# Patient Record
Sex: Female | Born: 1985 | Race: Black or African American | Hispanic: No | Marital: Single | Smoking: Never smoker
Health system: Southern US, Community
[De-identification: ages and names within clinical notes are randomized; demographics above are authoritative.]

## PROBLEM LIST (undated history)

## (undated) DIAGNOSIS — G259 Extrapyramidal and movement disorder, unspecified: Secondary | ICD-10-CM

## (undated) DIAGNOSIS — E119 Type 2 diabetes mellitus without complications: Secondary | ICD-10-CM

## (undated) DIAGNOSIS — G35 Multiple sclerosis: Secondary | ICD-10-CM

## (undated) HISTORY — DX: Type 2 diabetes mellitus without complications: E11.9

## (undated) HISTORY — DX: Extrapyramidal and movement disorder, unspecified: G25.9

---
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
## 2007-02-19 ENCOUNTER — Emergency Department (HOSPITAL_COMMUNITY): Admission: EM | Admit: 2007-02-19 | Discharge: 2007-02-19 | Payer: Self-pay | Admitting: Family Medicine

## 2007-06-04 ENCOUNTER — Emergency Department (HOSPITAL_COMMUNITY): Admission: EM | Admit: 2007-06-04 | Discharge: 2007-06-04 | Payer: Self-pay | Admitting: Emergency Medicine

## 2007-06-04 IMAGING — CR DG KNEE COMPLETE 4+V*R*
4 series · 4 of 4 positions shown · non-contrast
Comparison: none

CLINICAL DATA: Pain.  
RIGHT KNEE - 4 VIEW:

[view not recorded (1 of 4)]
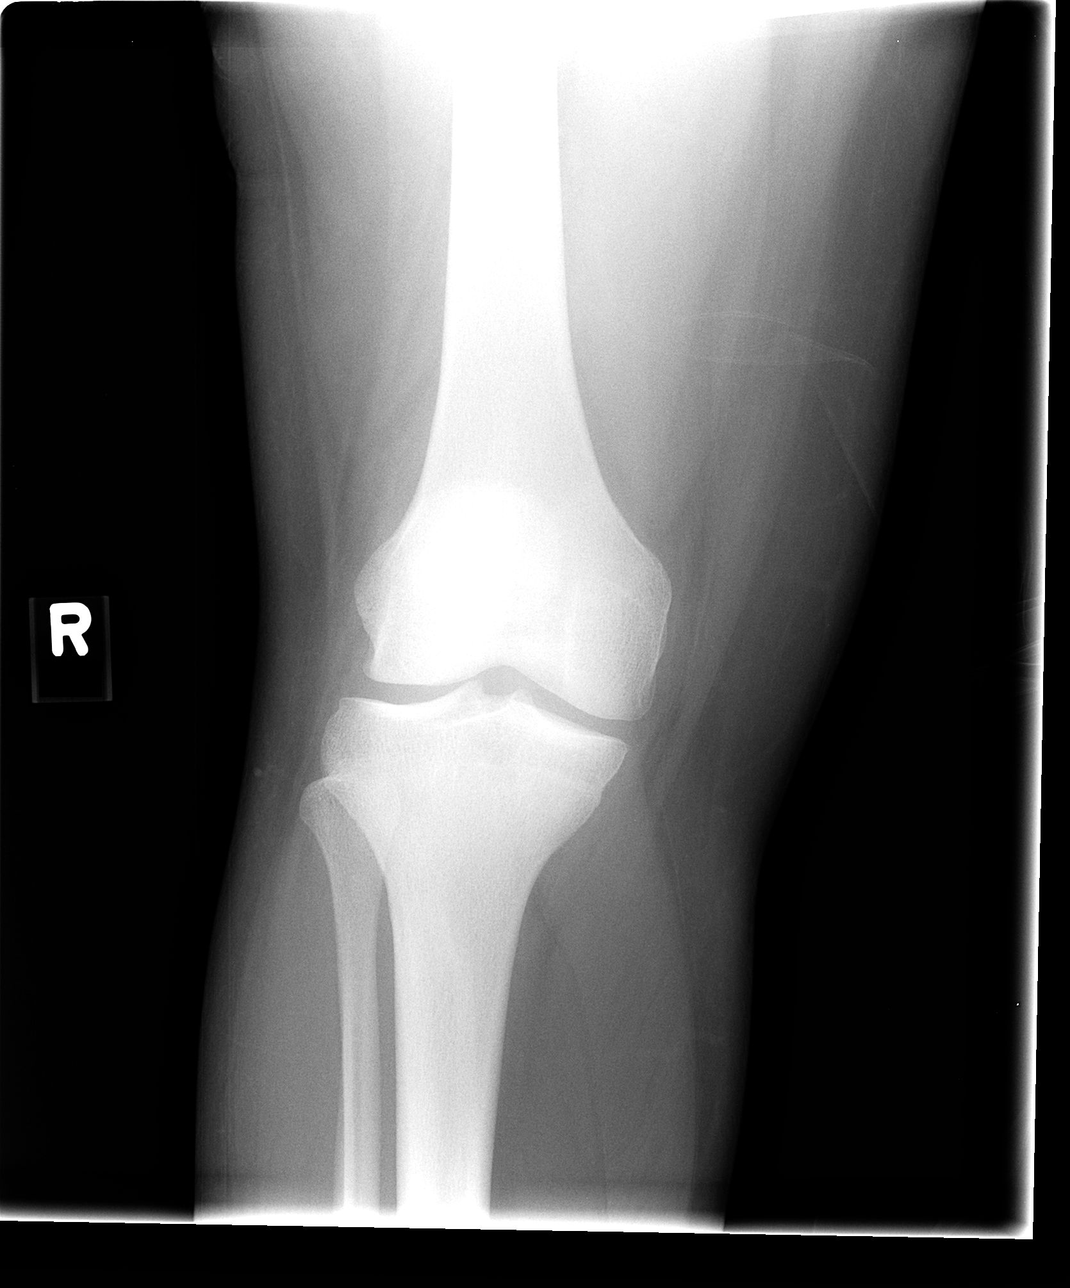

[view not recorded (2 of 4)]
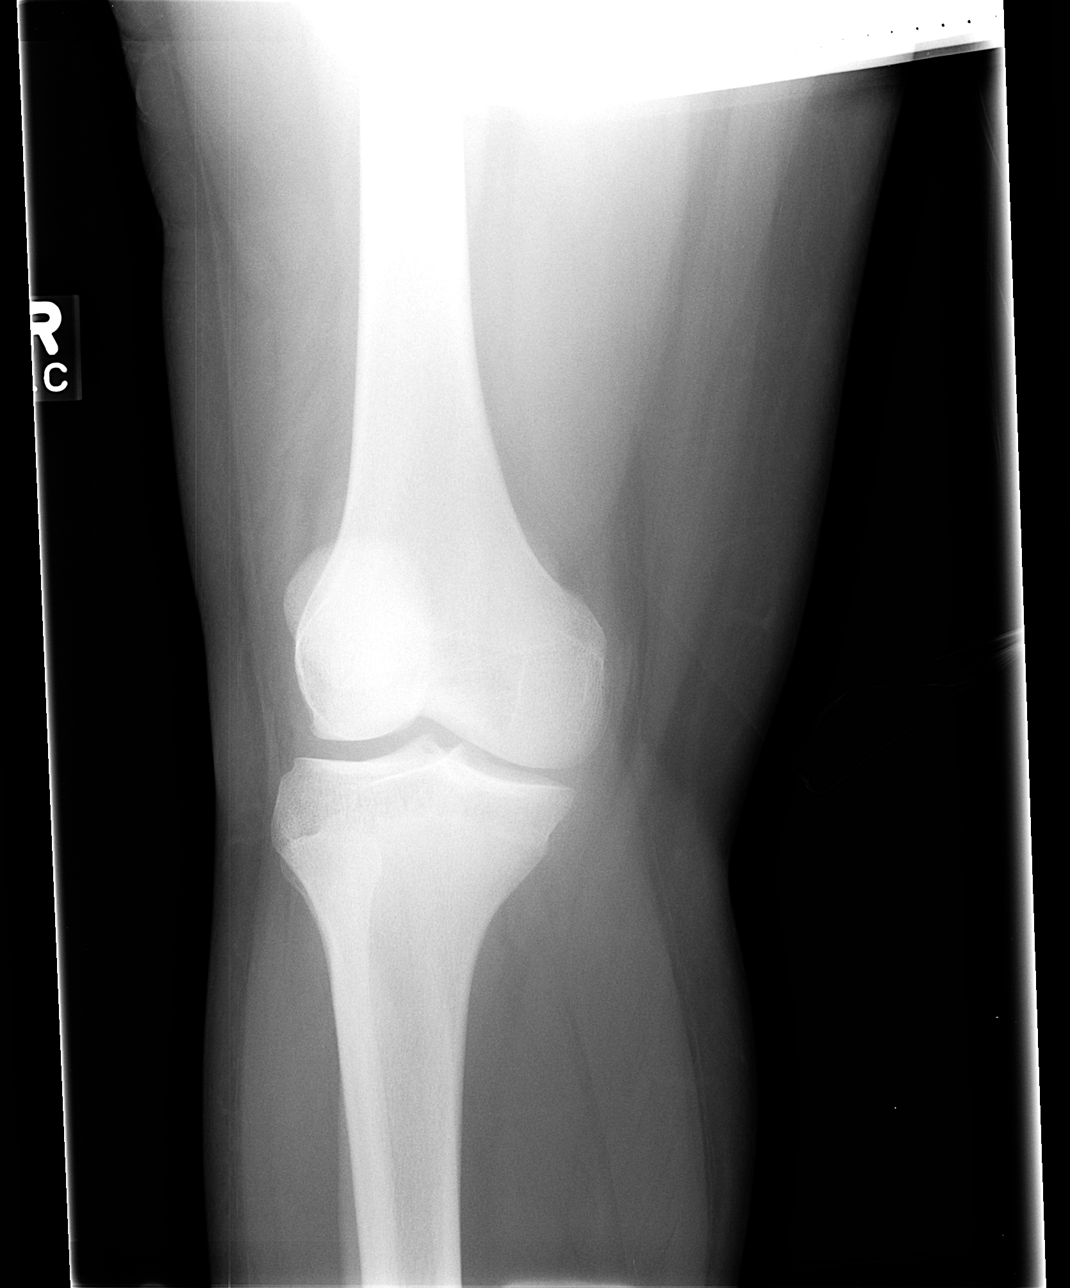

[view not recorded (3 of 4)]
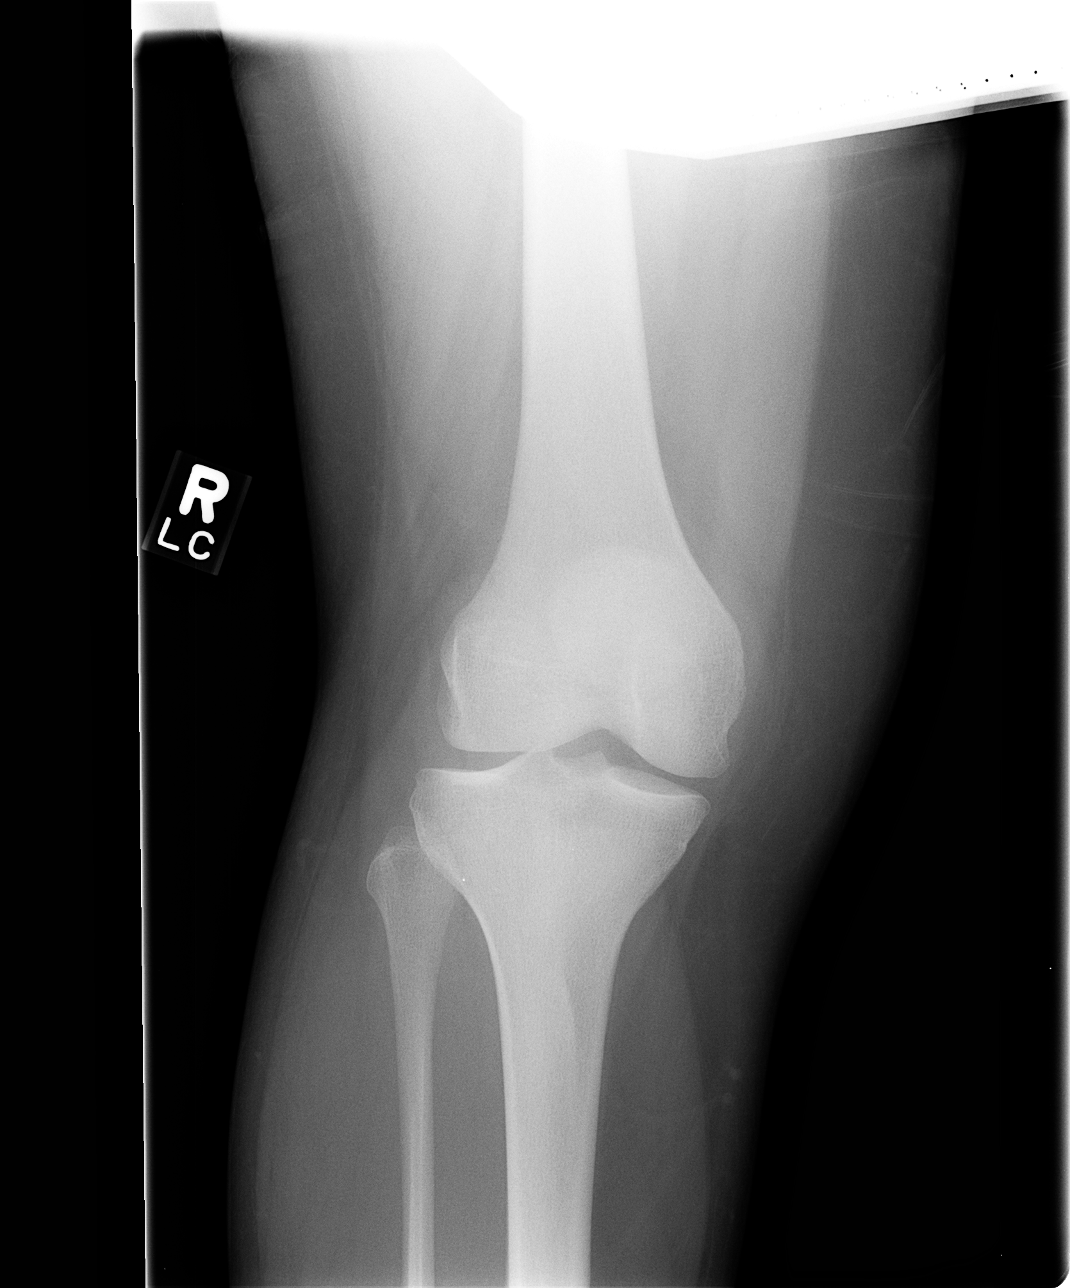

[view not recorded (4 of 4)]
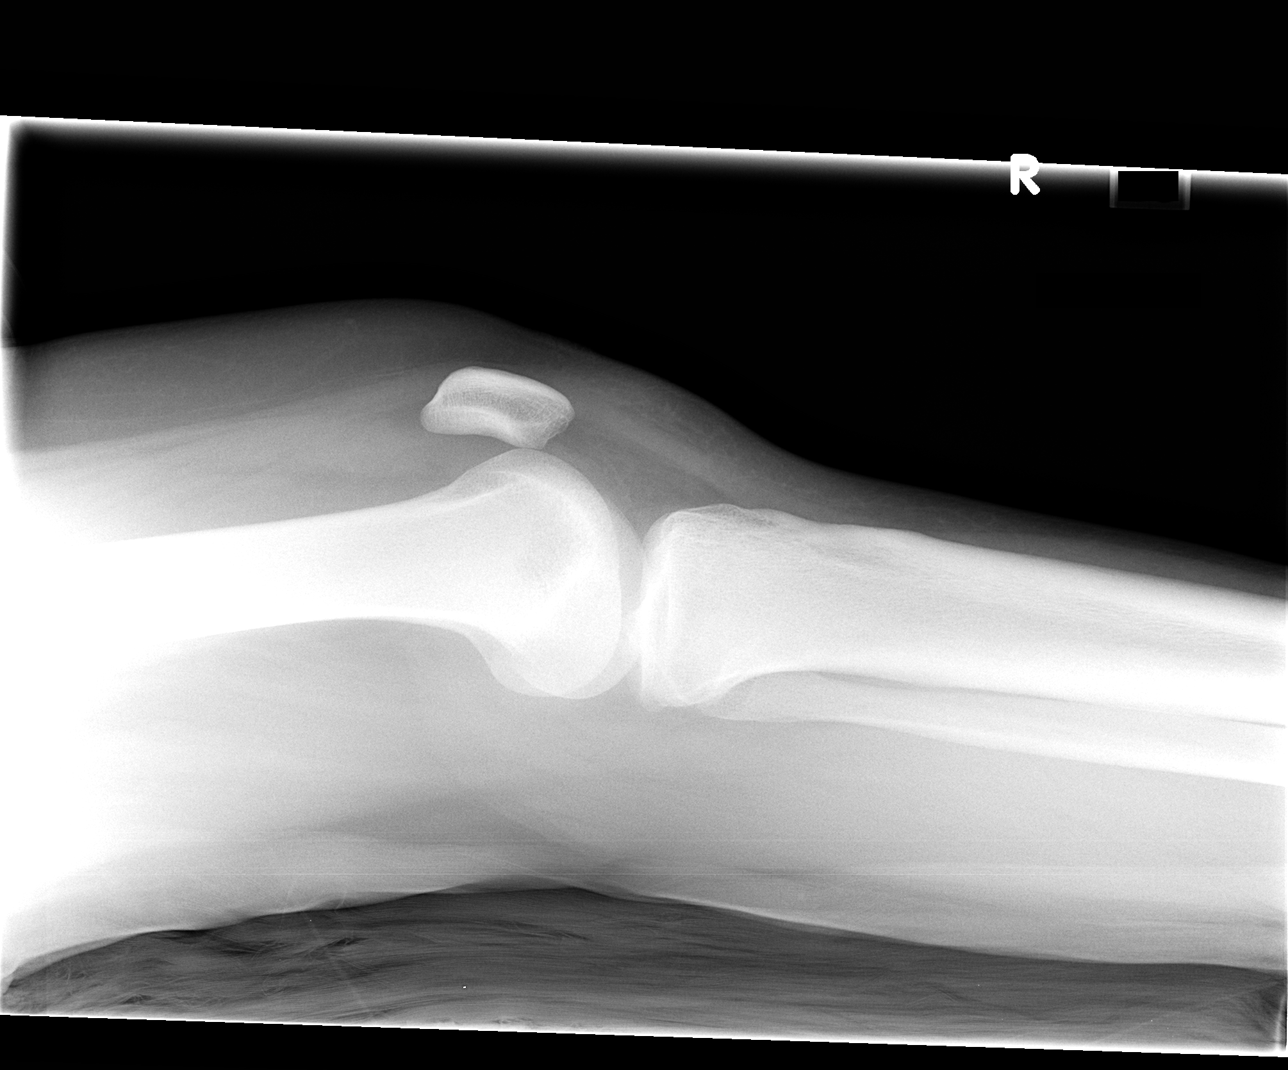

[4 of 4 positions shown; findings below may reference images not displayed]

FINDINGS: There is a moderate joint effusion.  No fracture or dislocation.  Joint spaces appear maintained.
IMPRESSION: Moderate joint effusion without underlying fracture.

## 2007-08-08 ENCOUNTER — Emergency Department (HOSPITAL_COMMUNITY): Admission: EM | Admit: 2007-08-08 | Discharge: 2007-08-08 | Payer: Self-pay | Admitting: Neurosurgery

## 2007-09-14 ENCOUNTER — Emergency Department (HOSPITAL_COMMUNITY): Admission: EM | Admit: 2007-09-14 | Discharge: 2007-09-14 | Payer: Self-pay | Admitting: Advanced Practice Midwife

## 2007-09-16 ENCOUNTER — Emergency Department (HOSPITAL_COMMUNITY): Admission: EM | Admit: 2007-09-16 | Discharge: 2007-09-17 | Payer: Self-pay | Admitting: Emergency Medicine

## 2007-09-19 ENCOUNTER — Emergency Department (HOSPITAL_COMMUNITY): Admission: EM | Admit: 2007-09-19 | Discharge: 2007-09-19 | Payer: Self-pay | Admitting: Emergency Medicine

## 2007-09-19 IMAGING — CR DG LUMBAR SPINE COMPLETE 4+V
5 series · 5 of 5 positions shown · non-contrast
Comparison: none

CLINICAL DATA: Left-sided numbness.
 CERVICAL SPINE ? 5 VIEW:

[t l-spine a.p.]
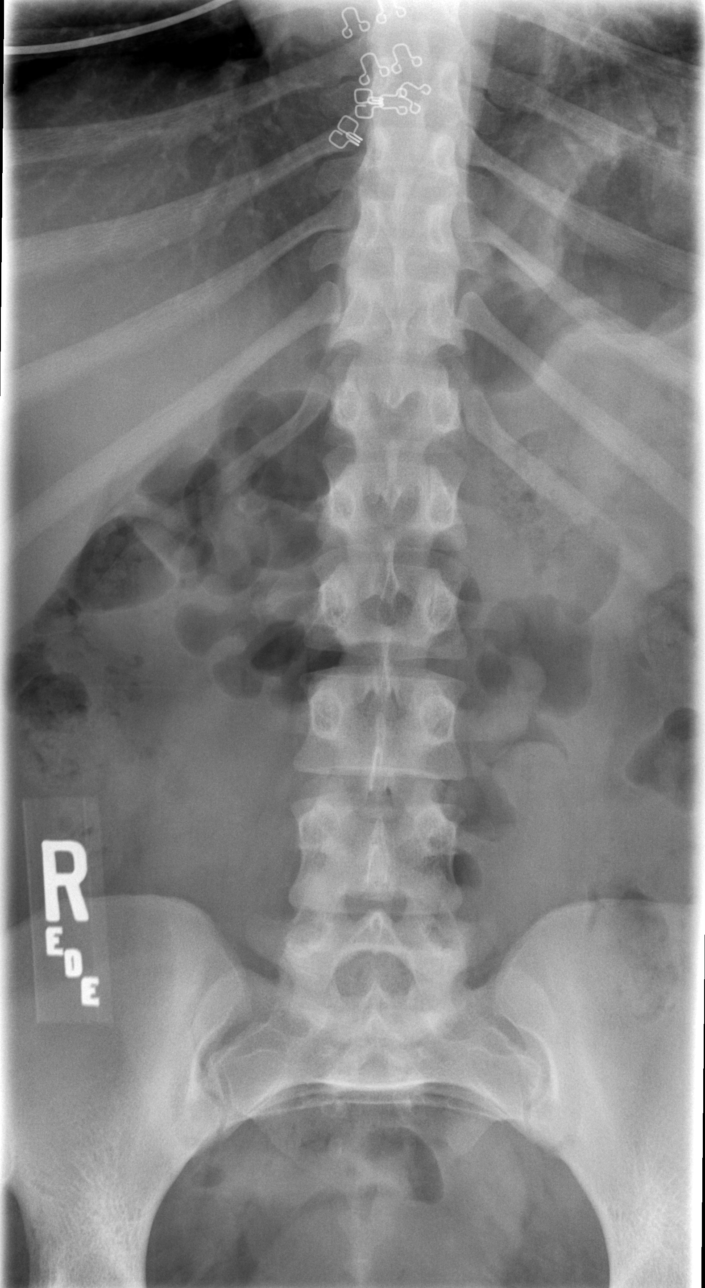

[t l-spine oblique exposure (1 of 2)]
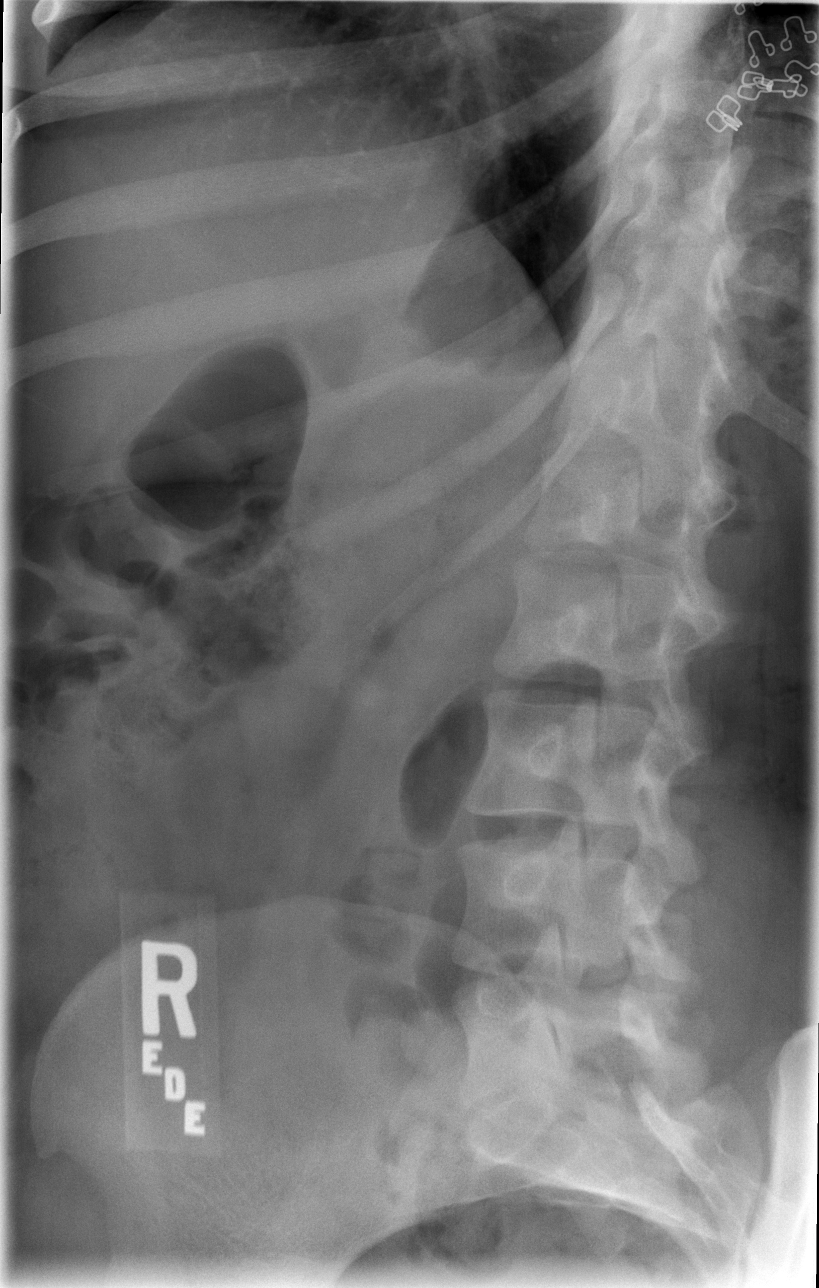

[t l-spine oblique exposure (2 of 2)]
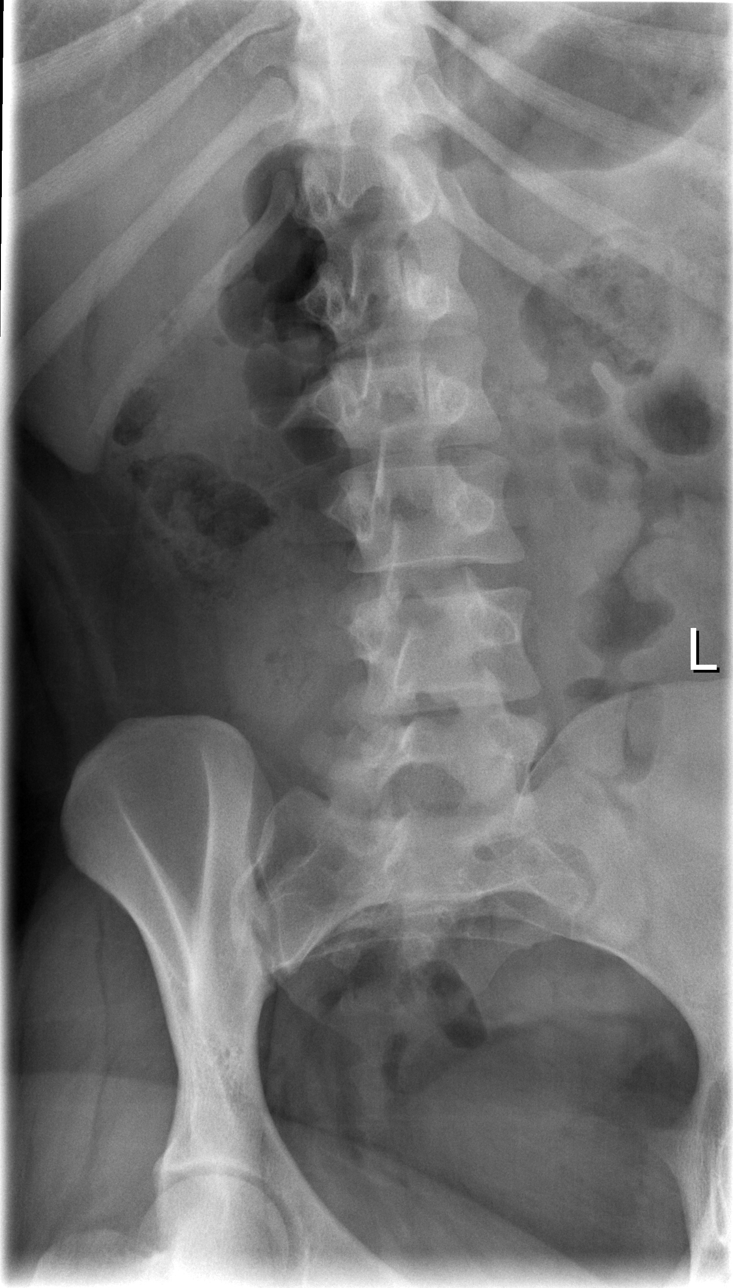

[t l-spine lat]
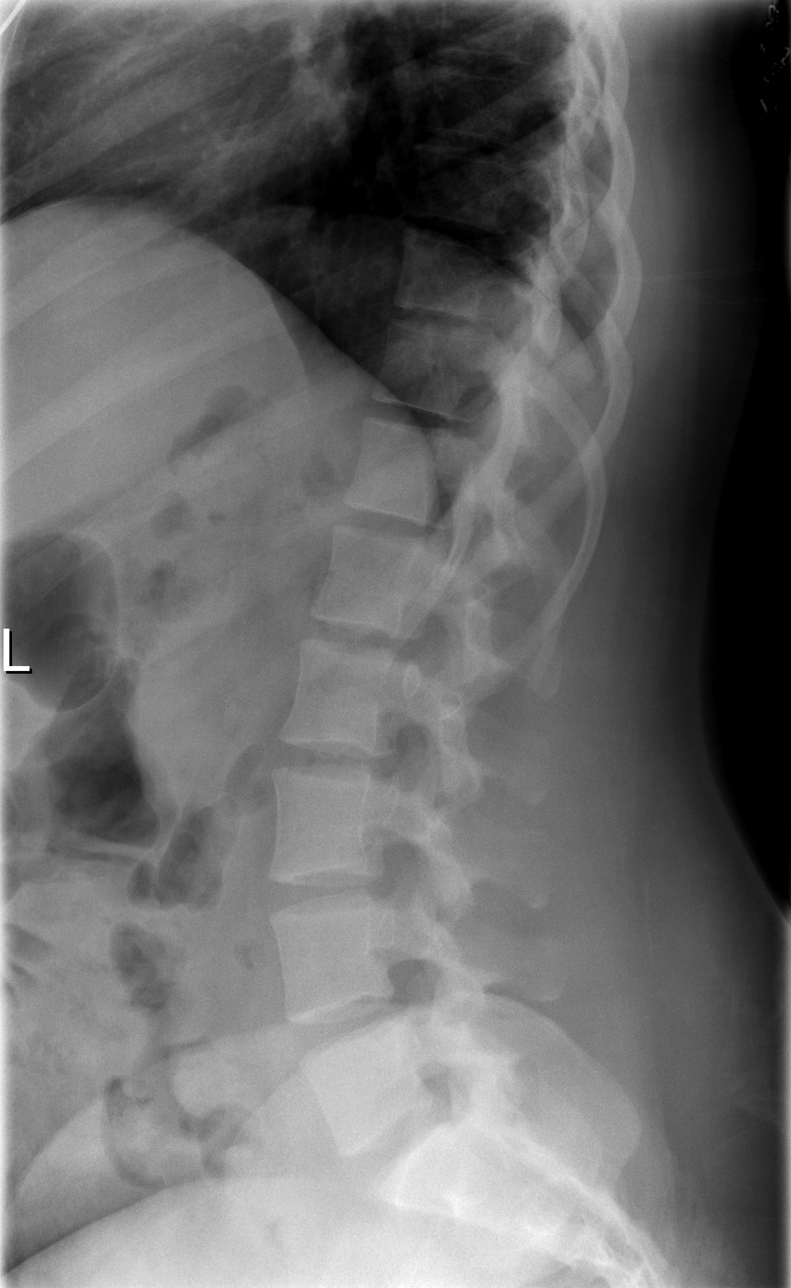

[t l-spine l5-s1 spot]
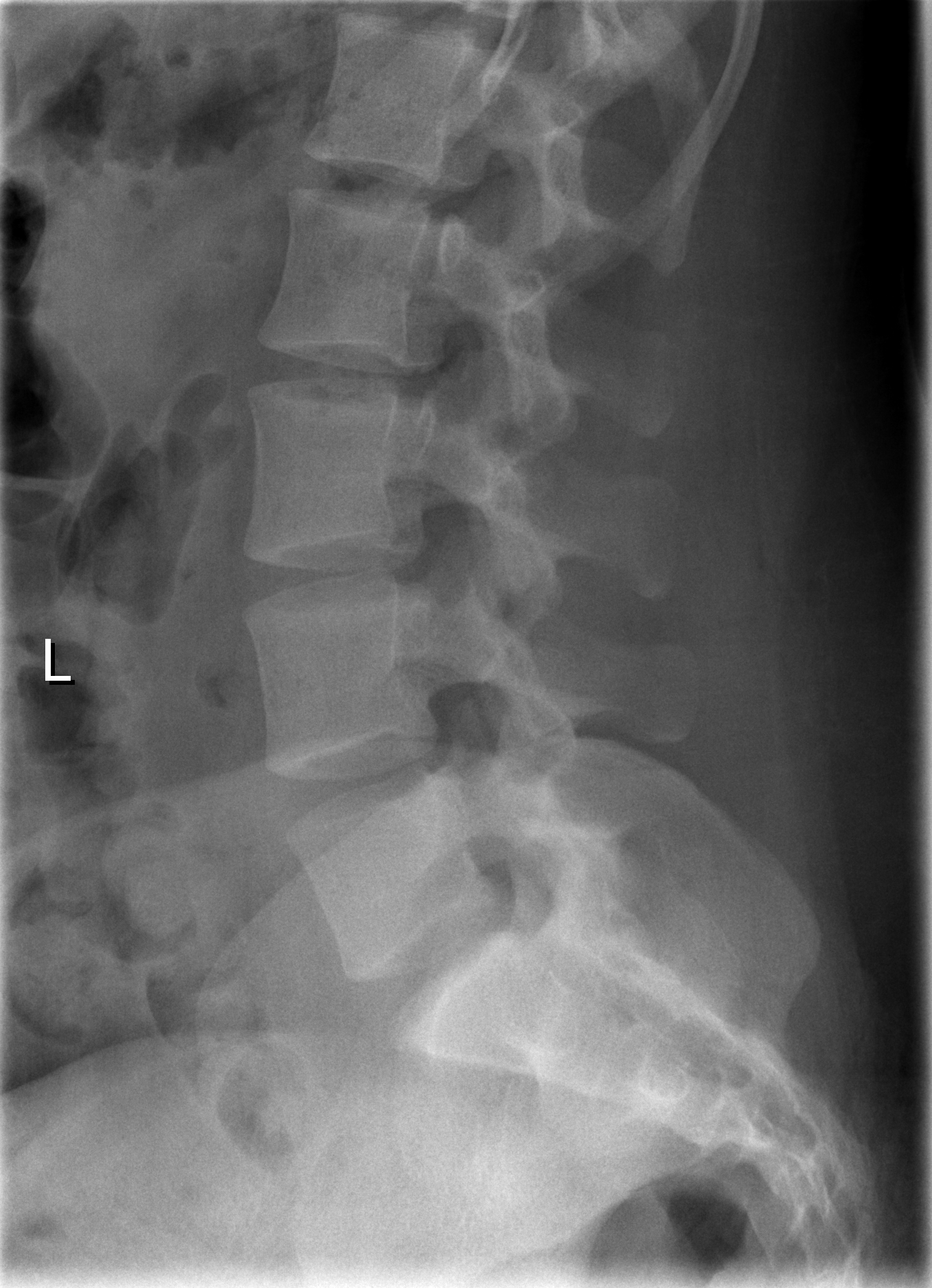

[5 of 5 positions shown; findings below may reference images not displayed]

FINDINGS: Vertebral body height and alignment are normal. There is no degenerative disease. Prevertebral soft tissues appear normal. Note is made of dental disease which is partially visualized.
IMPRESSION: Negative cervical spine with dental disease noted. Question tooth extraction.
 LUMBAR SPINE ? 4 VIEW:
FINDINGS: Vertebral body height and alignment are normal. There is no pars interarticularis defect.  No degenerative disease. Soft tissue structures appear normal.
IMPRESSION: Negative study.

## 2007-09-19 IMAGING — CR DG CERVICAL SPINE COMPLETE 4+V
4 series · 4 of 4 positions shown · non-contrast
Comparison: none

CLINICAL DATA: Left-sided numbness.
 CERVICAL SPINE ? 5 VIEW:

[w c-spine lat]
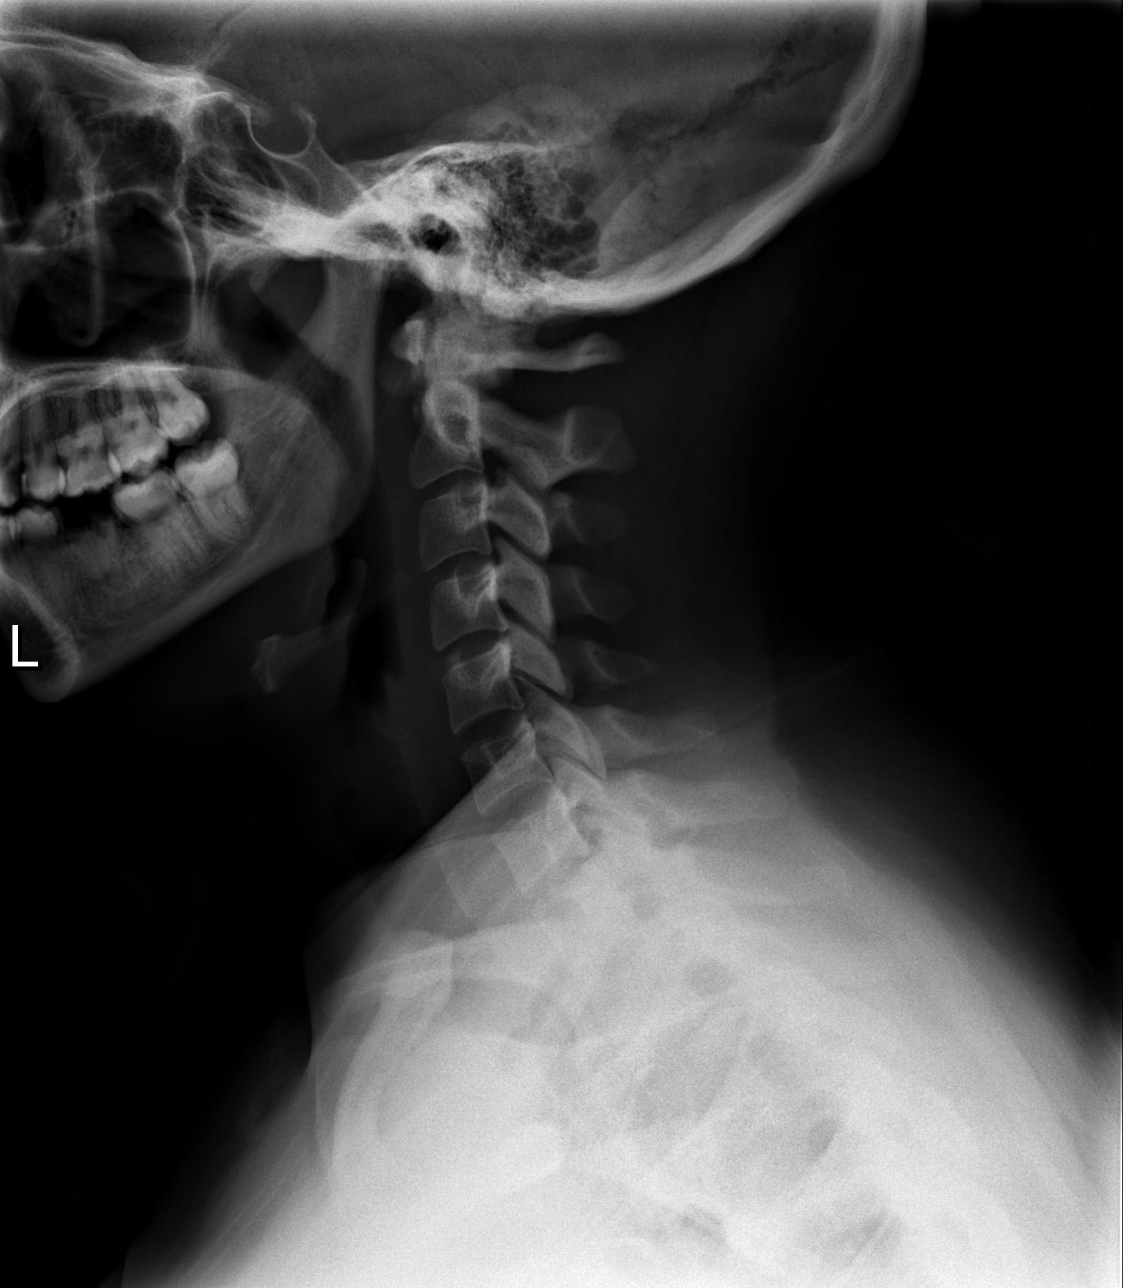

[w c-spine oblique]
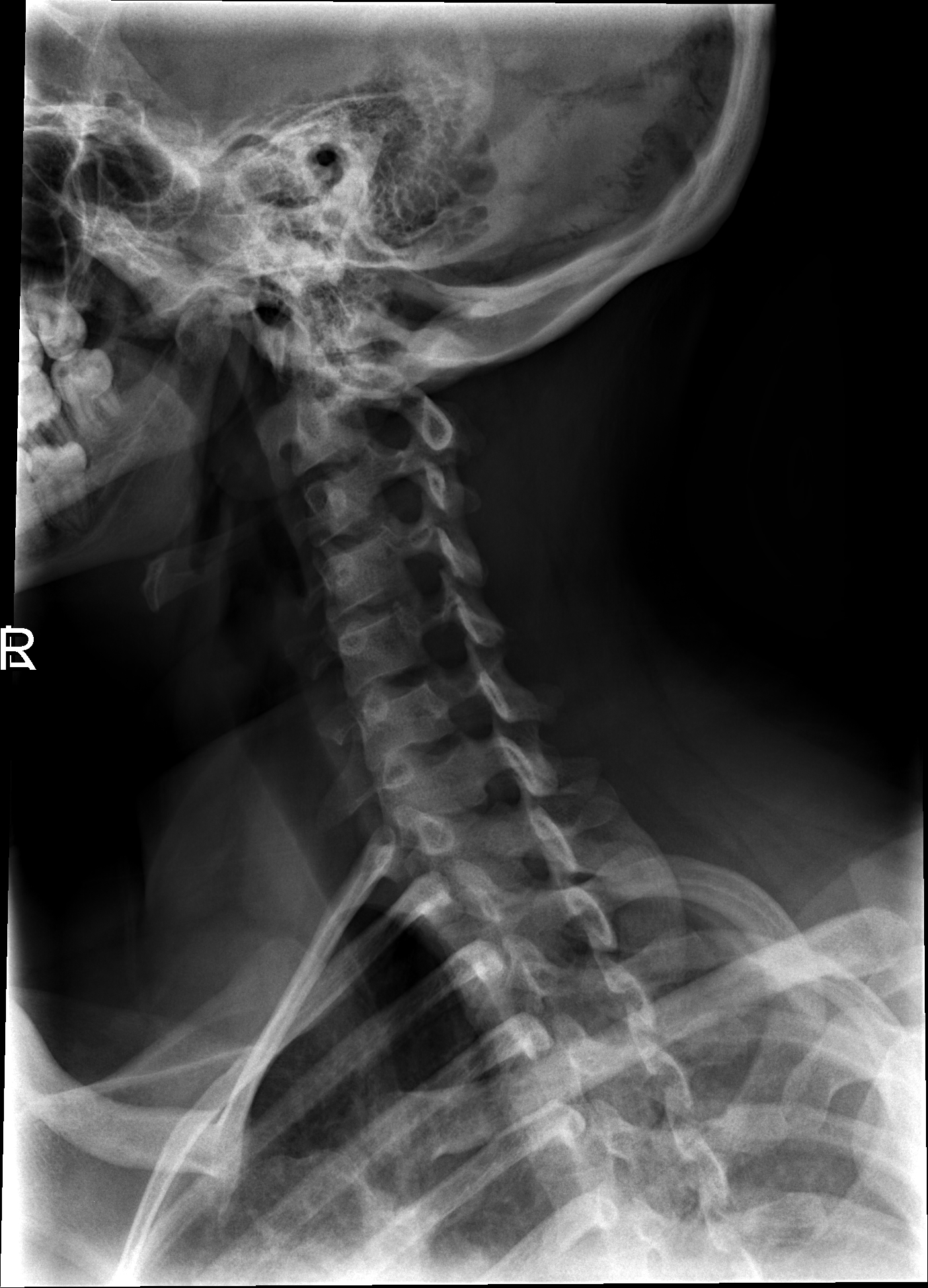

[w c-spine a.p.]
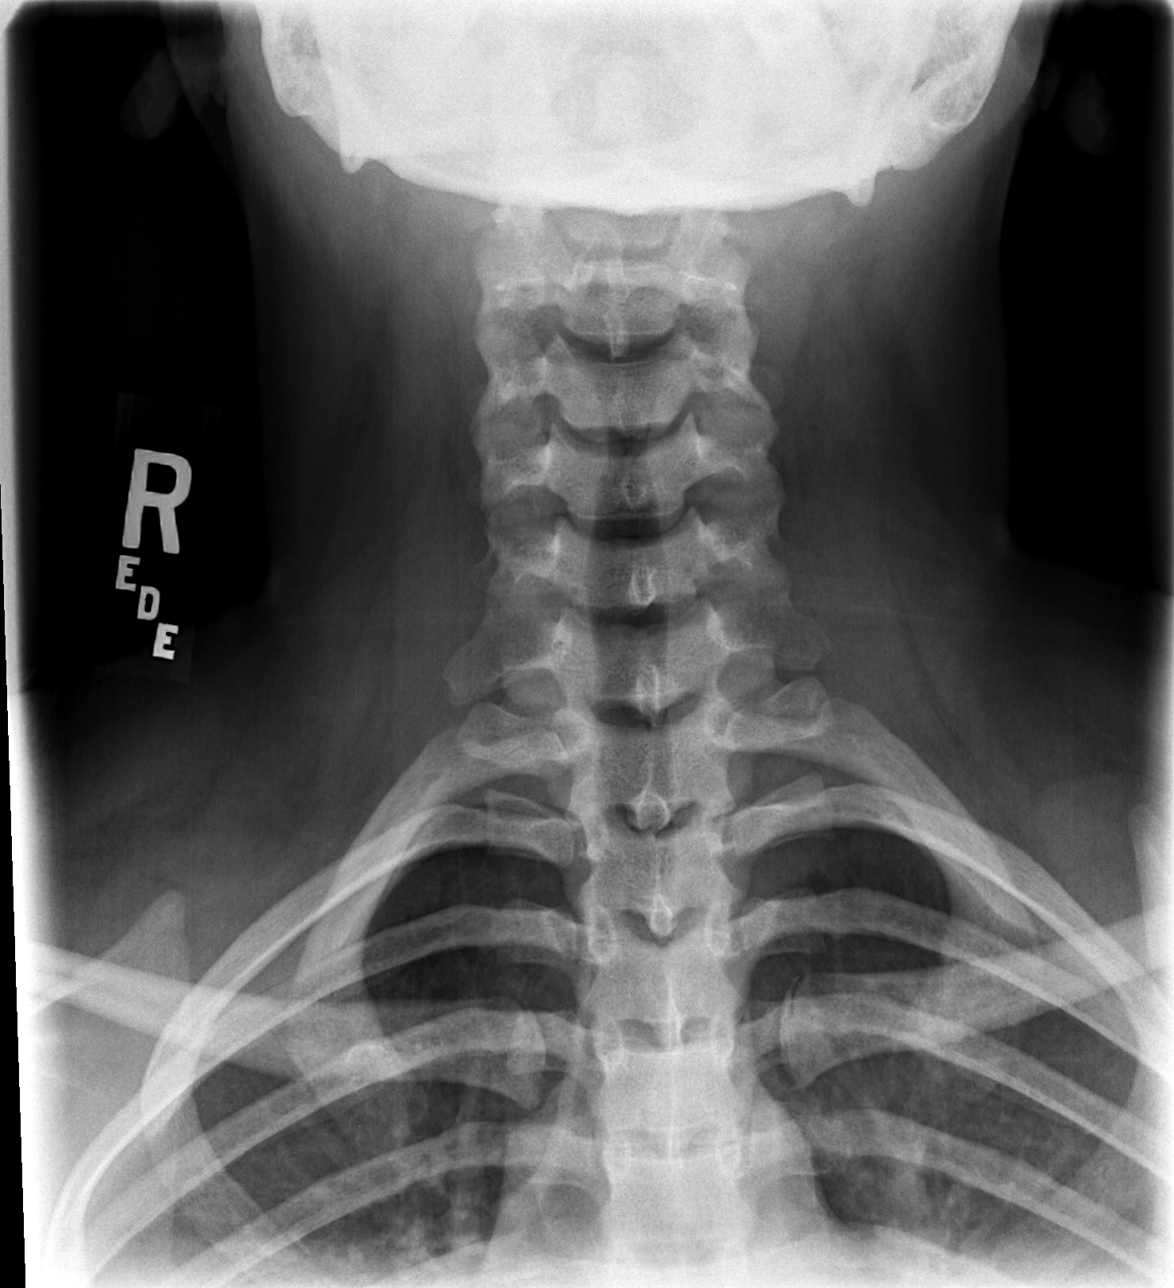

[w c-spine odontoid]
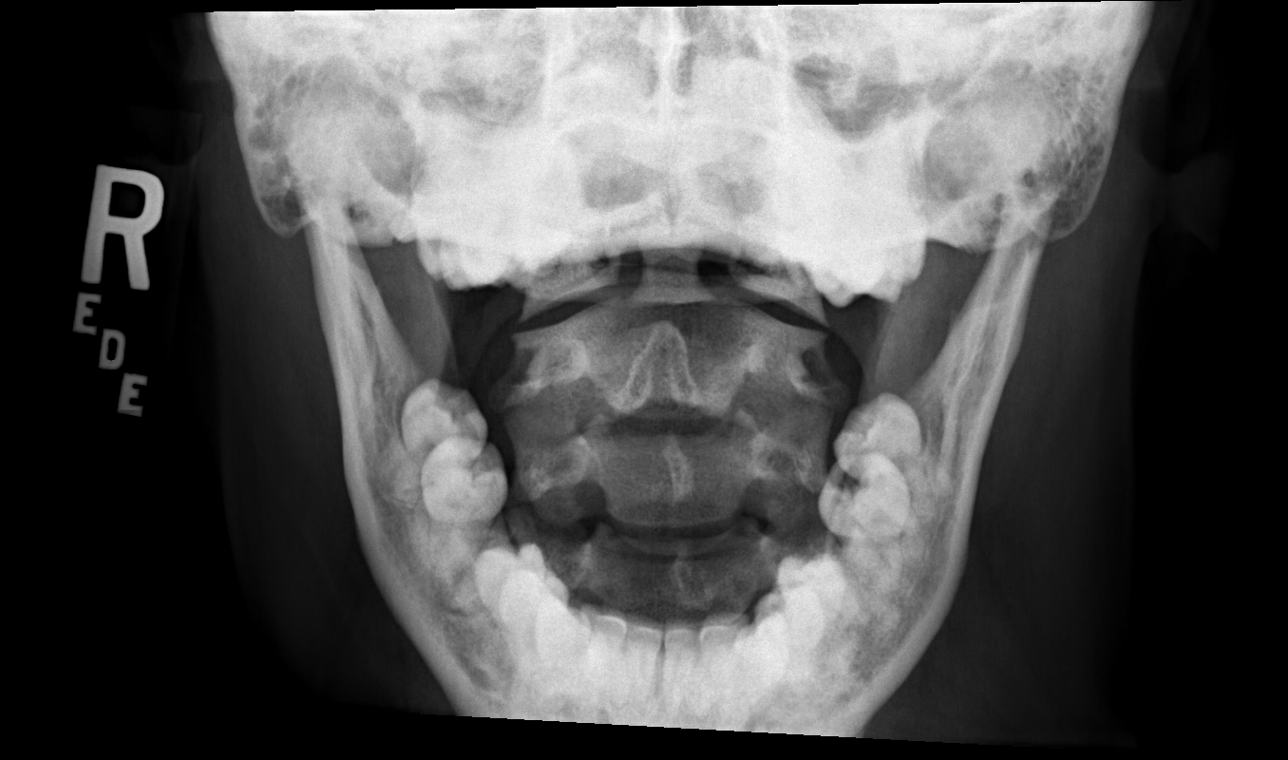

[4 of 4 positions shown; findings below may reference images not displayed]

FINDINGS: Vertebral body height and alignment are normal. There is no degenerative disease. Prevertebral soft tissues appear normal. Note is made of dental disease which is partially visualized.
IMPRESSION: Negative cervical spine with dental disease noted. Question tooth extraction.
 LUMBAR SPINE ? 4 VIEW:
FINDINGS: Vertebral body height and alignment are normal. There is no pars interarticularis defect.  No degenerative disease. Soft tissue structures appear normal.
IMPRESSION: Negative study.

## 2008-01-19 ENCOUNTER — Emergency Department (HOSPITAL_COMMUNITY): Admission: EM | Admit: 2008-01-19 | Discharge: 2008-01-20 | Payer: Self-pay | Admitting: Emergency Medicine
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
## 2008-03-12 ENCOUNTER — Emergency Department (HOSPITAL_COMMUNITY): Admission: EM | Admit: 2008-03-12 | Discharge: 2008-03-12 | Payer: Self-pay | Admitting: *Deleted

## 2008-06-11 ENCOUNTER — Emergency Department (HOSPITAL_COMMUNITY): Admission: EM | Admit: 2008-06-11 | Discharge: 2008-06-11 | Payer: Self-pay | Admitting: Emergency Medicine

## 2009-03-19 ENCOUNTER — Emergency Department (HOSPITAL_COMMUNITY): Admission: EM | Admit: 2009-03-19 | Discharge: 2009-03-19 | Payer: Self-pay | Admitting: Family Medicine

## 2009-03-28 ENCOUNTER — Emergency Department (HOSPITAL_COMMUNITY): Admission: EM | Admit: 2009-03-28 | Discharge: 2009-03-28 | Payer: Self-pay | Admitting: Emergency Medicine

## 2009-07-19 ENCOUNTER — Inpatient Hospital Stay (HOSPITAL_COMMUNITY): Admission: AD | Admit: 2009-07-19 | Discharge: 2009-07-20 | Payer: Self-pay | Admitting: Obstetrics & Gynecology

## 2009-07-19 ENCOUNTER — Ambulatory Visit: Payer: Self-pay | Admitting: Obstetrics and Gynecology

## 2009-07-19 ENCOUNTER — Encounter: Payer: Self-pay | Admitting: Family Medicine

## 2009-07-19 ENCOUNTER — Emergency Department (HOSPITAL_COMMUNITY): Admission: EM | Admit: 2009-07-19 | Discharge: 2009-07-19 | Payer: Self-pay | Admitting: Emergency Medicine

## 2009-07-19 IMAGING — US US OB COMP +14 WK
1 series · 14 of 28 positions shown · non-contrast
Comparison: none

OBSTETRICAL ULTRASOUND:
 This ultrasound exam was performed in the [HOSPITAL] Ultrasound Department.  The OB US report was generated in the AS system, and faxed to the ordering physician.  This report is also available in [HOSPITAL]?s [employer] and in [REDACTED] PACS.

[Series 1: us ob comp +14 wk · 0.24mm/px · 72 acquisitions, 14 frames shown]
[im 3/72]
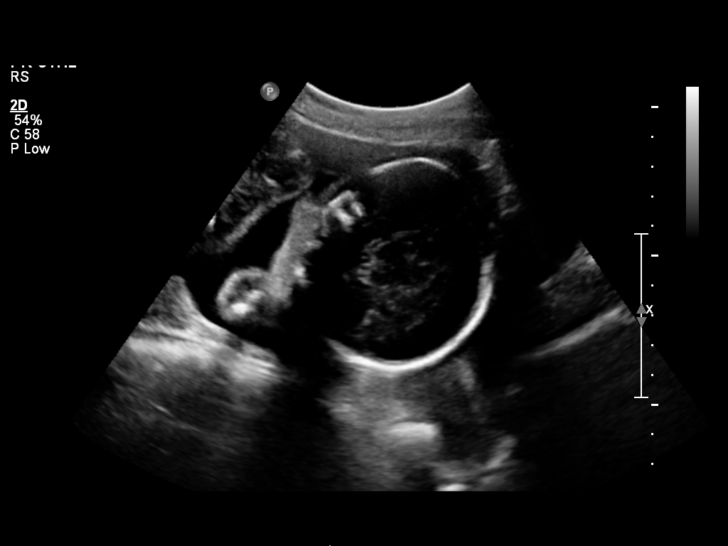
[im 8/72]
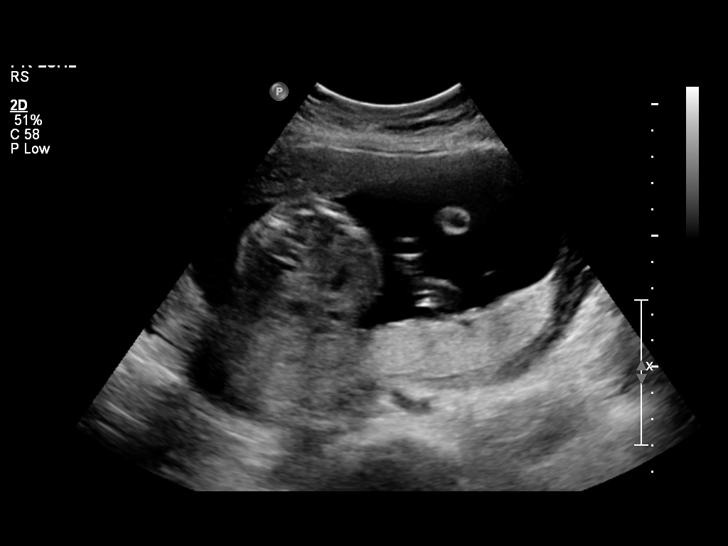
[im 14/72]
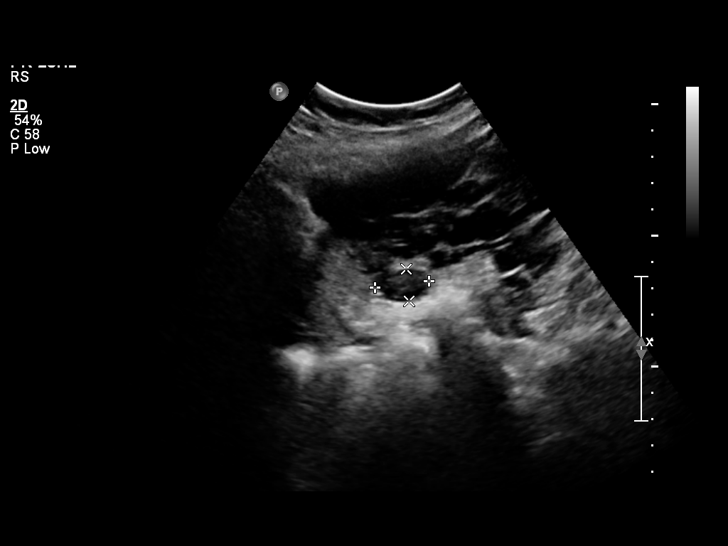
[im 19/72]
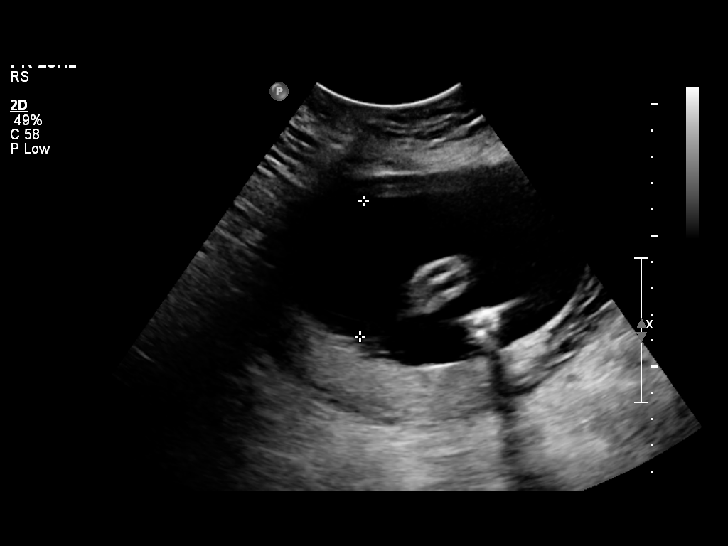
[im 24/72]
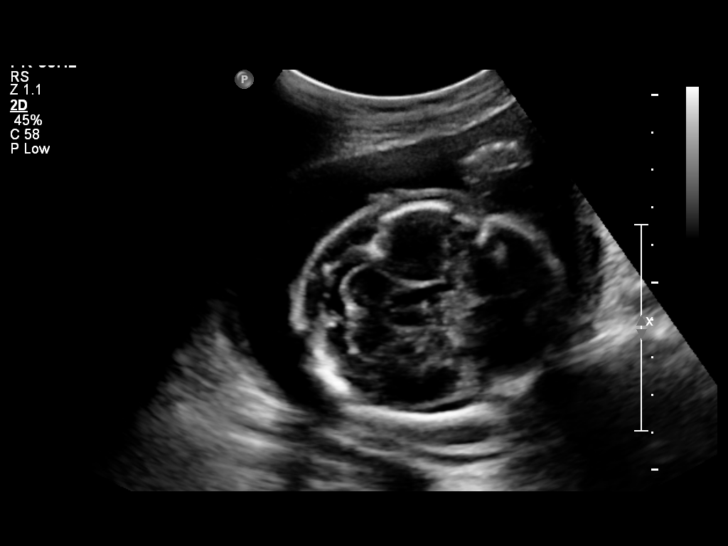
[im 29/72]
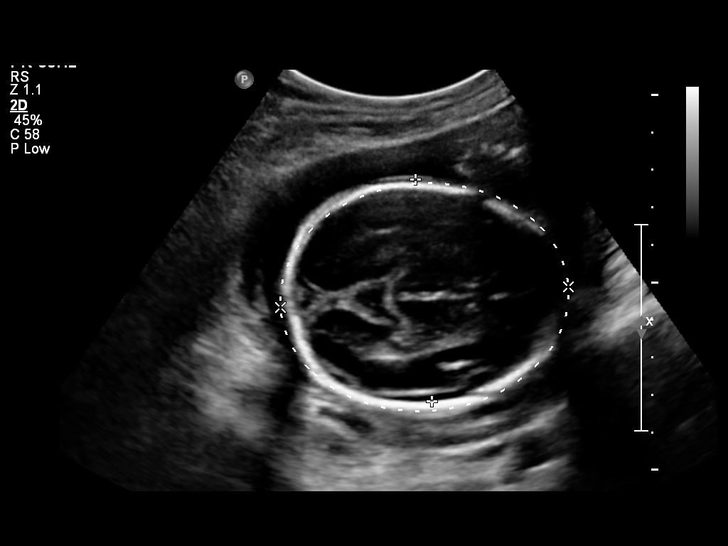
[im 35/72]
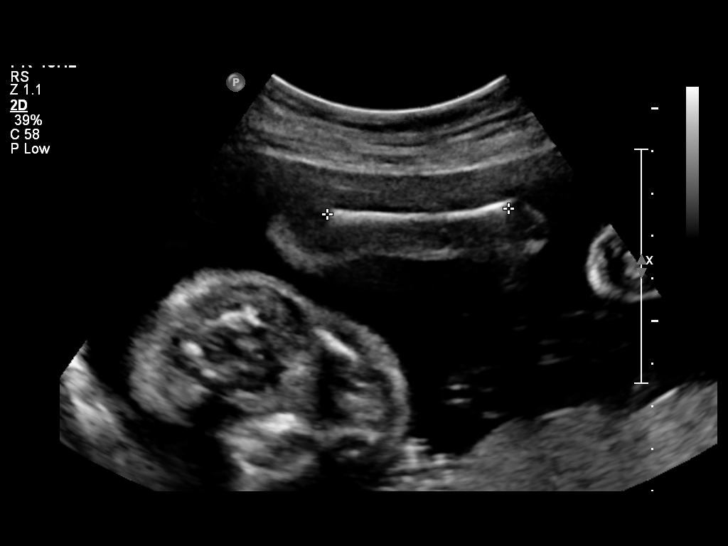
[im 40/72]
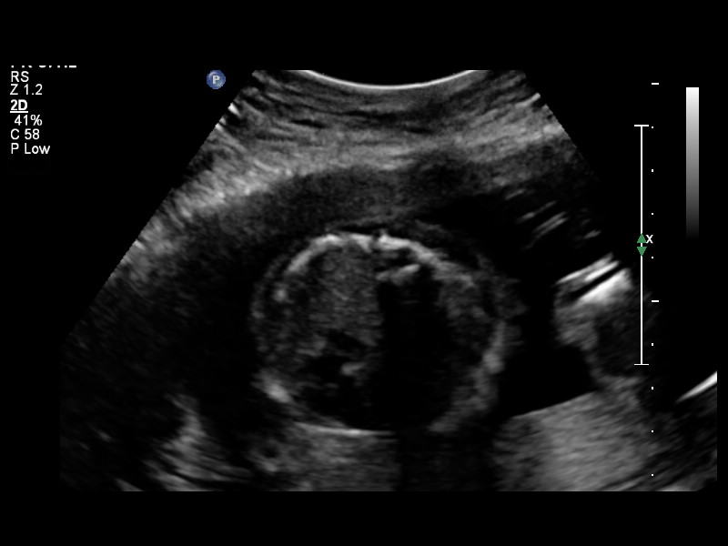
[im 45/72]
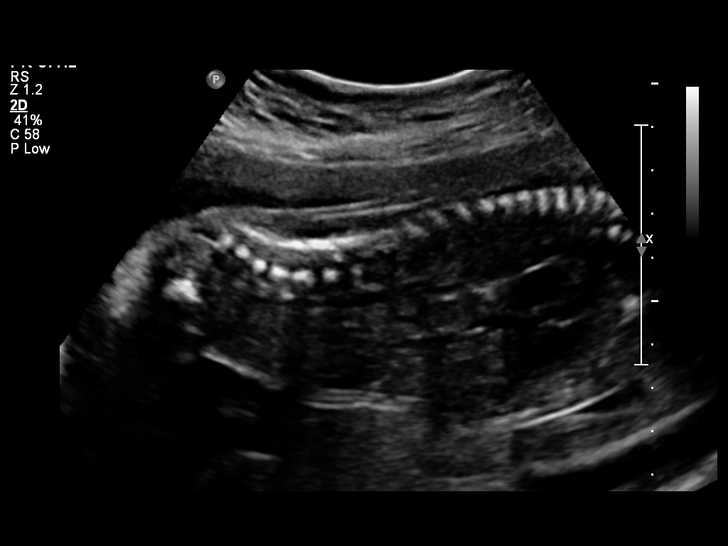
[im 50/72]
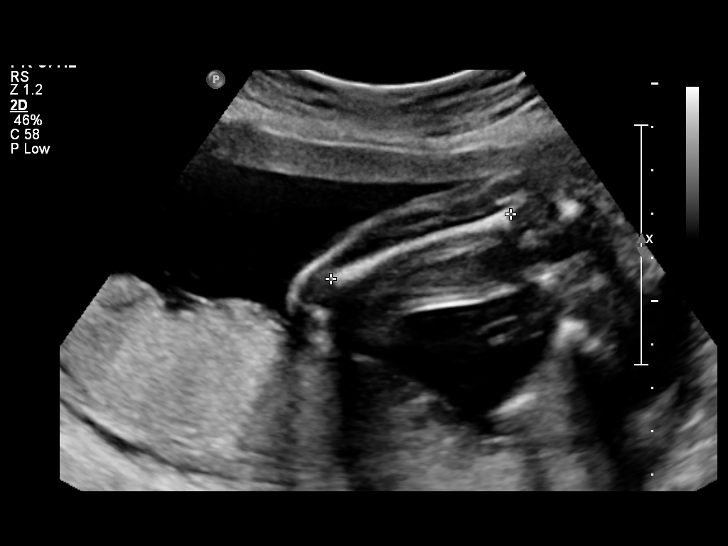
[im 56/72]
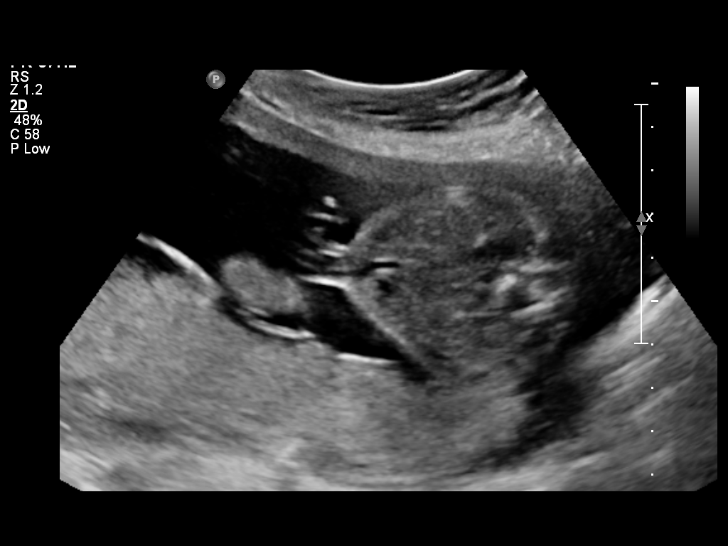
[im 61/72]
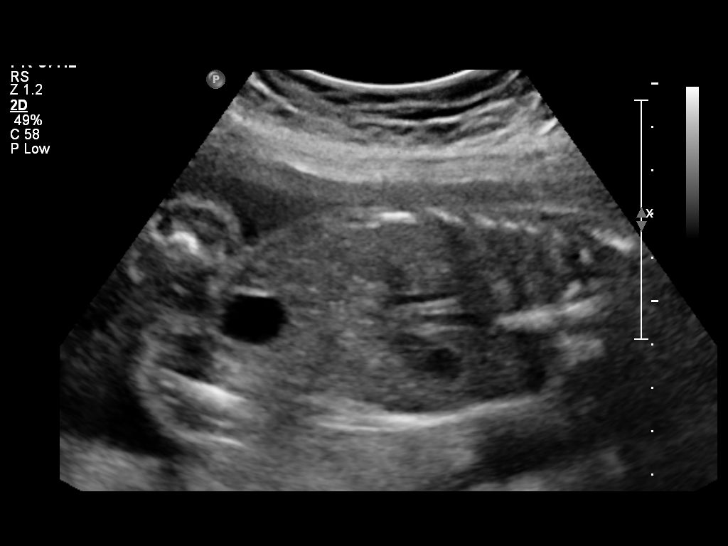
[im 66/72]
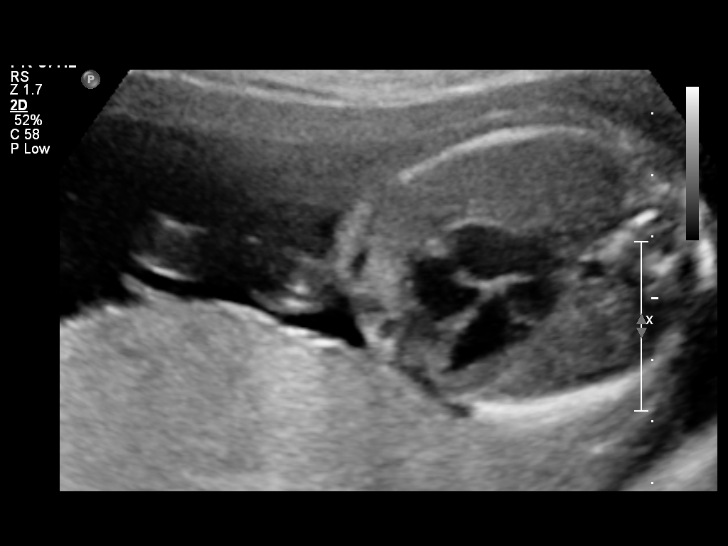
[im 72/72]
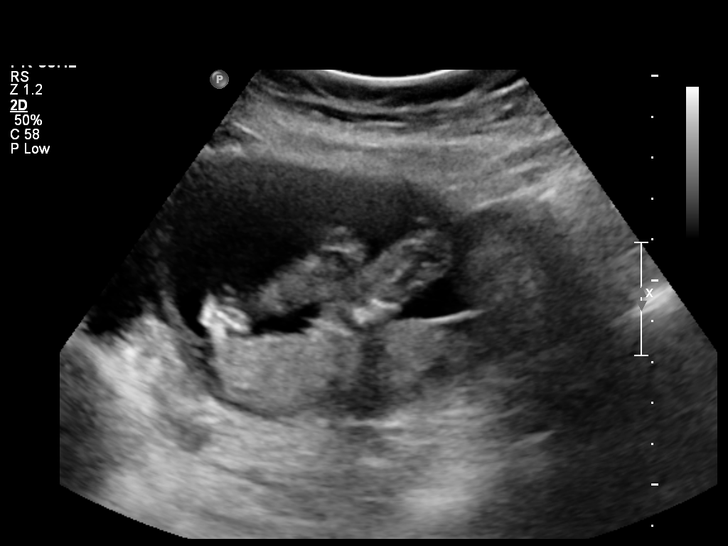

[14 of 28 positions shown; findings below may reference images not displayed]

IMPRESSION: See AS Obstetric US report.
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
## 2009-09-13 ENCOUNTER — Inpatient Hospital Stay (HOSPITAL_COMMUNITY): Admission: AD | Admit: 2009-09-13 | Discharge: 2009-09-13 | Payer: Self-pay | Admitting: Family Medicine

## 2009-09-13 ENCOUNTER — Encounter: Payer: Self-pay | Admitting: Family Medicine

## 2009-09-13 IMAGING — US US OB FOLLOW-UP
2 series · 14 of 28 positions shown · non-contrast
Comparison: none

OBSTETRICAL ULTRASOUND:
 This ultrasound exam was performed in the [HOSPITAL] Ultrasound Department.  The OB US report was generated in the AS system, and faxed to the ordering physician.  This report is also available in [HOSPITAL]?s [employer] and in [REDACTED] PACS.

[Series 1: us ob follow up · 0.24mm/px · 40 acquisitions, 10 frames shown (1 of 2)]
[im 2/40]
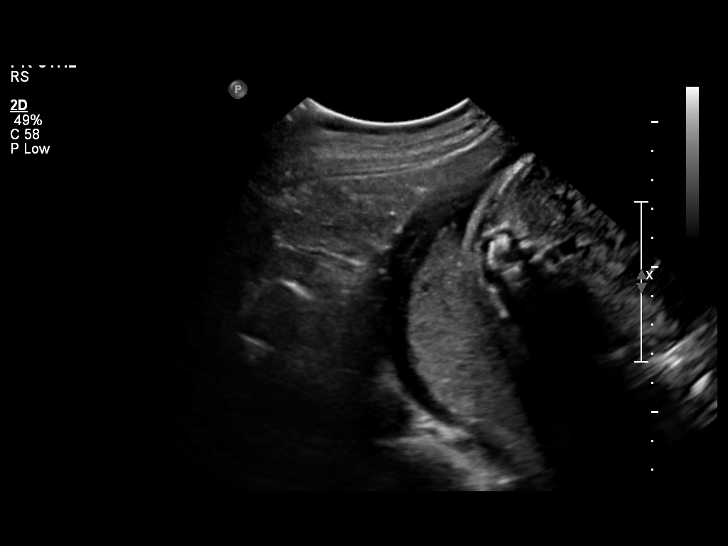
[im 6/40]
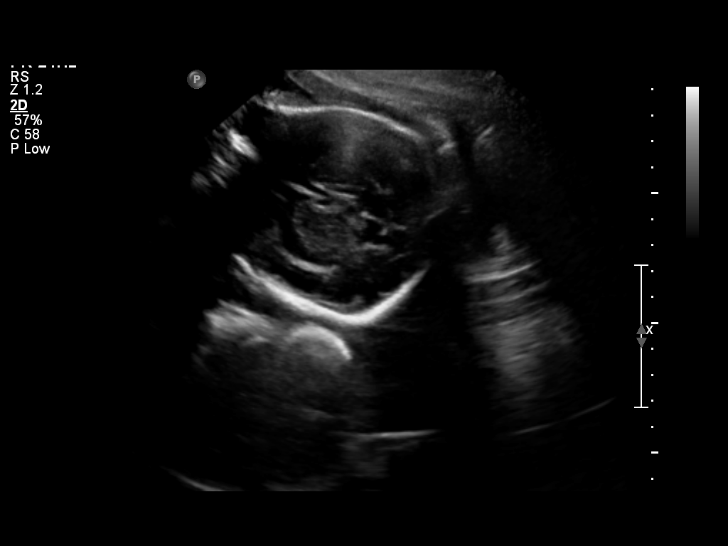
[im 10/40]
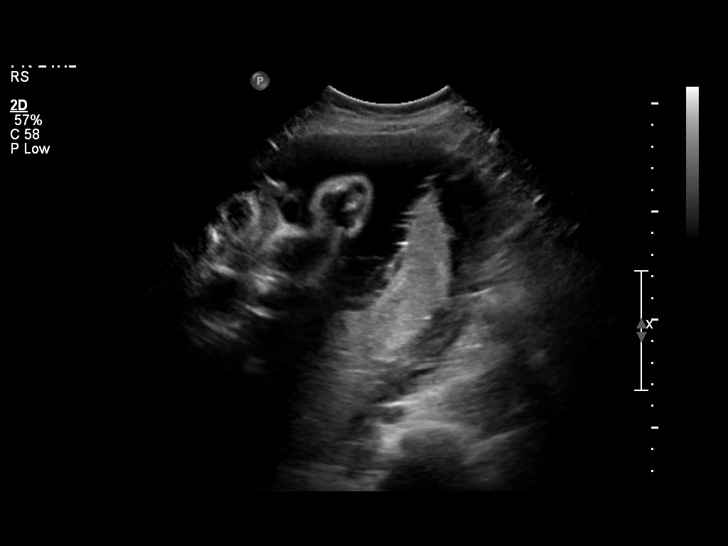
[im 14/40]
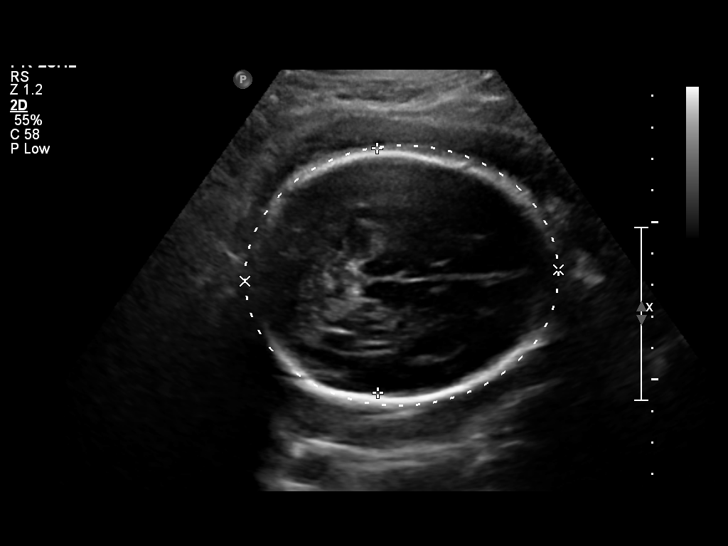
[im 18/40]
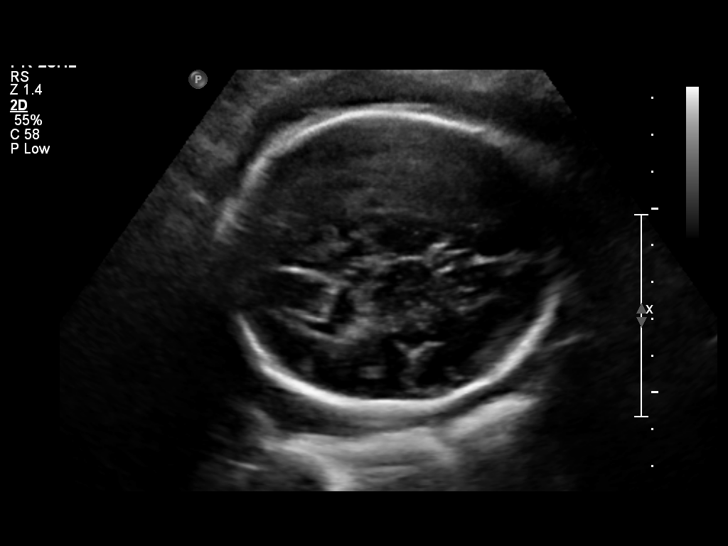
[im 22/40]
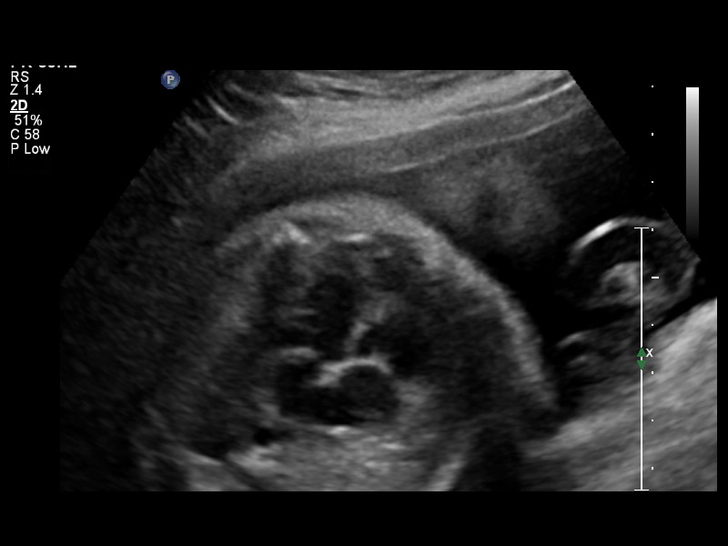
[im 26/40]
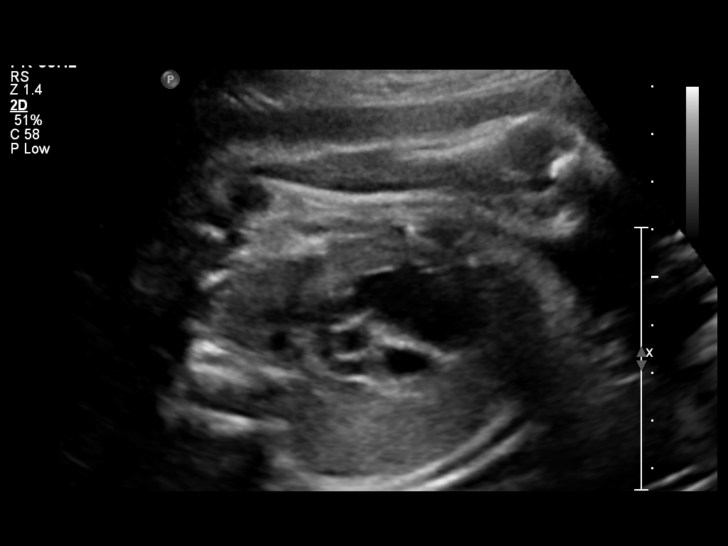
[im 30/40]
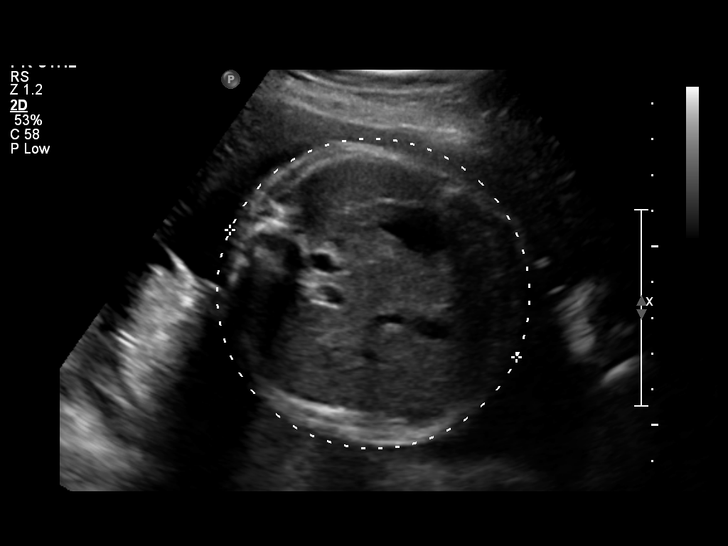
[im 34/40]
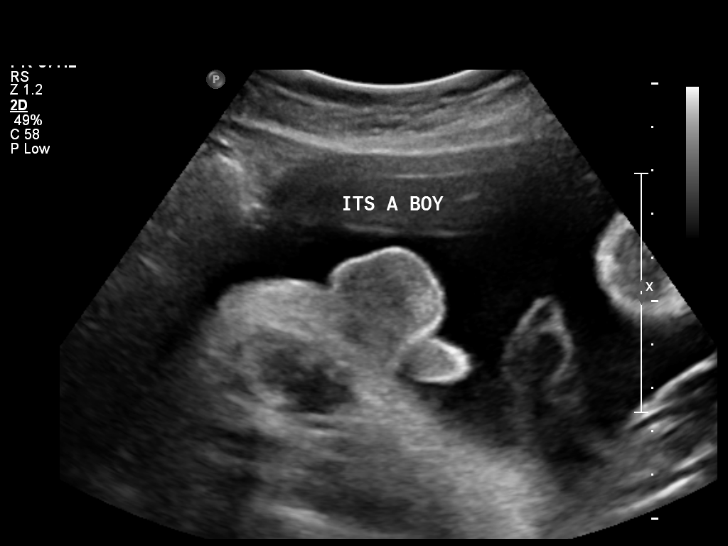
[im 38/40]
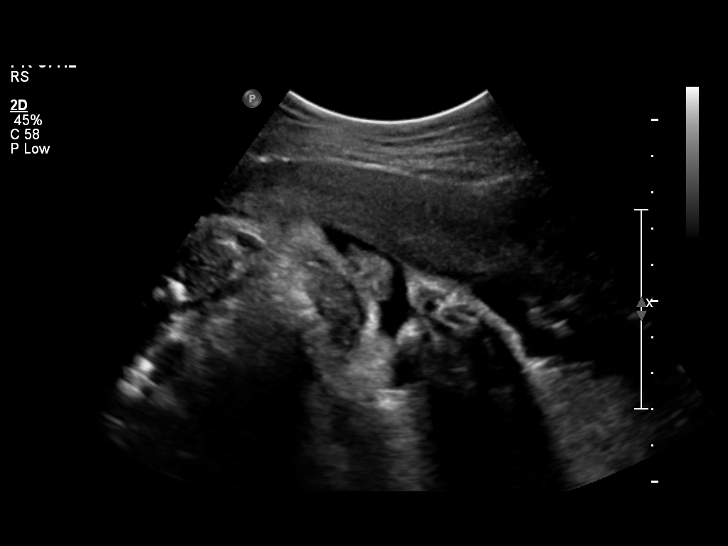

[Series 1: us ob follow up · 0.22mm/px · 4 of 14 slices shown (2 of 2)]
[im 1/14]
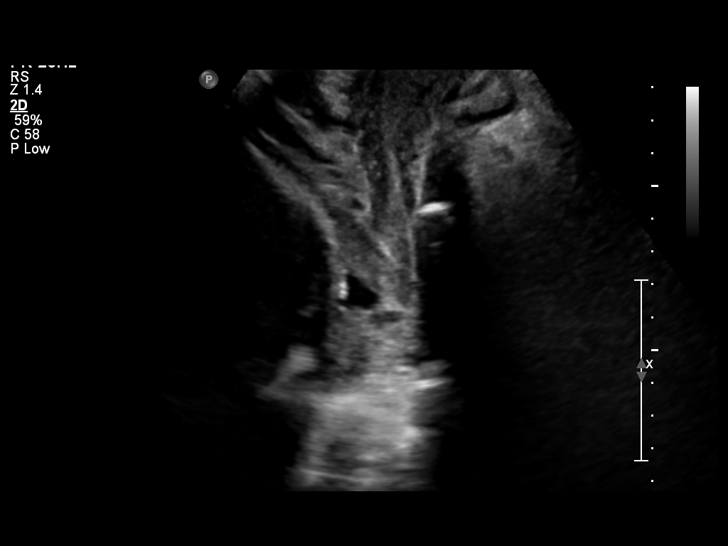
[im 5/14]
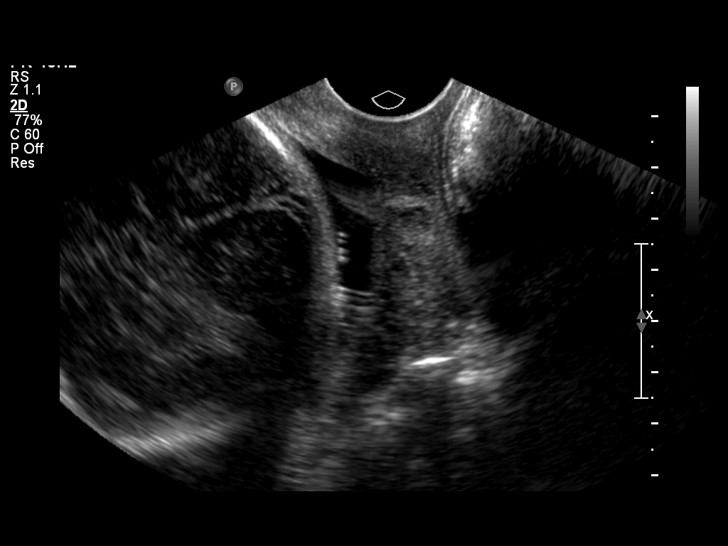
[im 9/14]
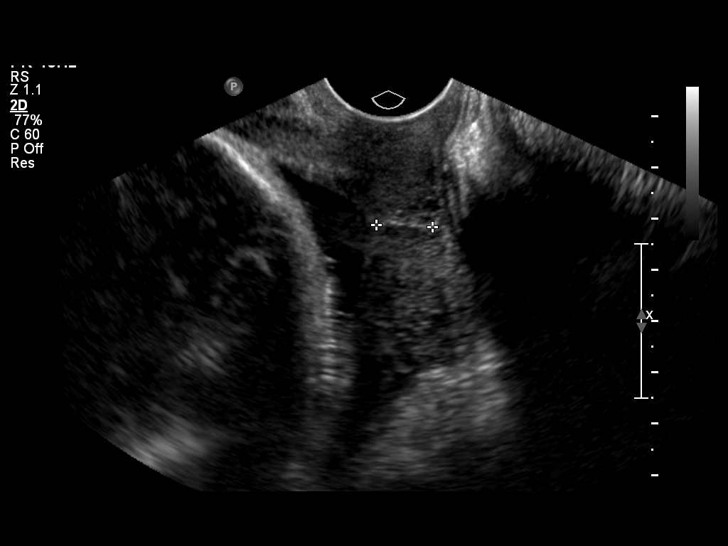
[im 14/14]
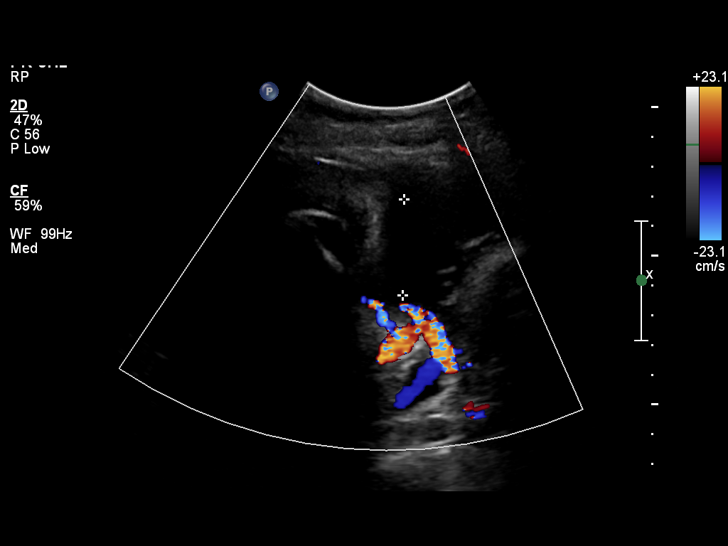

[14 of 28 positions shown; findings below may reference images not displayed]

IMPRESSION: See AS Obstetric US report.

## 2009-09-16 ENCOUNTER — Encounter: Payer: Self-pay | Admitting: Obstetrics & Gynecology

## 2009-09-16 ENCOUNTER — Ambulatory Visit: Payer: Self-pay | Admitting: Obstetrics and Gynecology

## 2009-09-16 ENCOUNTER — Encounter: Payer: Self-pay | Admitting: Family Medicine

## 2009-09-16 LAB — CONVERTED CEMR LAB: Antibody Screen: NEGATIVE

## 2009-09-21 ENCOUNTER — Emergency Department (HOSPITAL_COMMUNITY): Admission: EM | Admit: 2009-09-21 | Discharge: 2009-09-21 | Payer: Self-pay | Admitting: Family Medicine

## 2009-09-23 ENCOUNTER — Ambulatory Visit: Payer: Self-pay | Admitting: Obstetrics & Gynecology

## 2009-09-30 ENCOUNTER — Encounter: Payer: Self-pay | Admitting: Obstetrics & Gynecology

## 2009-09-30 ENCOUNTER — Ambulatory Visit: Payer: Self-pay | Admitting: Obstetrics & Gynecology

## 2009-09-30 LAB — CONVERTED CEMR LAB: Pap Smear: UNDETERMINED

## 2009-10-01 ENCOUNTER — Encounter: Payer: Self-pay | Admitting: Obstetrics & Gynecology

## 2009-10-01 LAB — CONVERTED CEMR LAB
Amphetamine Screen, Ur: NEGATIVE
Barbiturate Quant, Ur: NEGATIVE
Benzodiazepines.: NEGATIVE
Cocaine Metabolites: NEGATIVE
Creatinine,U: 99.3 mg/dL
Marijuana Metabolite: NEGATIVE
Methadone: NEGATIVE
Opiate Screen, Urine: NEGATIVE
Phencyclidine (PCP): NEGATIVE
Propoxyphene: NEGATIVE

## 2009-10-07 ENCOUNTER — Ambulatory Visit: Payer: Self-pay | Admitting: Obstetrics & Gynecology

## 2009-10-21 ENCOUNTER — Ambulatory Visit: Payer: Self-pay | Admitting: Family Medicine

## 2009-10-21 LAB — CONVERTED CEMR LAB
Chlamydia, Swab/Urine, PCR: NEGATIVE
GC Probe Amp, Urine: NEGATIVE

## 2009-10-28 ENCOUNTER — Ambulatory Visit: Payer: Self-pay | Admitting: Obstetrics & Gynecology

## 2009-10-28 ENCOUNTER — Inpatient Hospital Stay (HOSPITAL_COMMUNITY): Admission: AD | Admit: 2009-10-28 | Discharge: 2009-11-01 | Payer: Self-pay | Admitting: Family Medicine

## 2009-10-28 ENCOUNTER — Encounter: Payer: Self-pay | Admitting: Obstetrics & Gynecology

## 2009-10-28 ENCOUNTER — Ambulatory Visit: Payer: Self-pay | Admitting: Family Medicine

## 2009-12-15 ENCOUNTER — Ambulatory Visit: Payer: Self-pay | Admitting: Obstetrics and Gynecology

## 2009-12-15 LAB — CONVERTED CEMR LAB
HCT: 37.4 % (ref 36.0–46.0)
Hemoglobin: 12.8 g/dL (ref 12.0–15.0)
MCHC: 34.2 g/dL (ref 30.0–36.0)
MCV: 83.3 fL (ref 78.0–100.0)
Platelets: 270 10*3/uL (ref 150–400)
RBC: 4.49 M/uL (ref 3.87–5.11)
RDW: 13.1 % (ref 11.5–15.5)
WBC: 6.3 10*3/uL (ref 4.0–10.5)

## 2010-01-14 ENCOUNTER — Ambulatory Visit: Payer: Self-pay | Admitting: Obstetrics & Gynecology

## 2010-01-14 ENCOUNTER — Other Ambulatory Visit: Admission: RE | Admit: 2010-01-14 | Discharge: 2010-01-14 | Payer: Self-pay | Admitting: Obstetrics & Gynecology

## 2010-02-06 ENCOUNTER — Emergency Department (HOSPITAL_COMMUNITY): Admission: EM | Admit: 2010-02-06 | Discharge: 2010-02-06 | Payer: Self-pay | Admitting: Emergency Medicine

## 2010-02-08 ENCOUNTER — Encounter: Admission: RE | Admit: 2010-02-08 | Discharge: 2010-04-28 | Payer: Self-pay | Admitting: Neurology

## 2010-04-01 ENCOUNTER — Ambulatory Visit: Payer: Self-pay | Admitting: Obstetrics & Gynecology

## 2010-06-24 ENCOUNTER — Ambulatory Visit: Payer: Self-pay | Admitting: Obstetrics & Gynecology

## 2010-07-29 ENCOUNTER — Ambulatory Visit
Admission: RE | Admit: 2010-07-29 | Discharge: 2010-07-29 | Payer: Self-pay | Source: Home / Self Care | Attending: Obstetrics & Gynecology | Admitting: Obstetrics & Gynecology

## 2010-09-09 ENCOUNTER — Ambulatory Visit: Payer: Self-pay

## 2010-09-16 ENCOUNTER — Ambulatory Visit (INDEPENDENT_AMBULATORY_CARE_PROVIDER_SITE_OTHER): Payer: Medicaid Other

## 2010-09-16 DIAGNOSIS — Z3049 Encounter for surveillance of other contraceptives: Secondary | ICD-10-CM

## 2010-10-08 LAB — POCT PREGNANCY, URINE: Preg Test, Ur: NEGATIVE

## 2010-10-09 LAB — POCT PREGNANCY, URINE: Preg Test, Ur: NEGATIVE

## 2010-10-12 LAB — COMPREHENSIVE METABOLIC PANEL
ALT: 16 U/L (ref 0–35)
AST: 29 U/L (ref 0–37)
Albumin: 3 g/dL — ABNORMAL LOW (ref 3.5–5.2)
Alkaline Phosphatase: 101 U/L (ref 39–117)
BUN: 3 mg/dL — ABNORMAL LOW (ref 6–23)
CO2: 27 mEq/L (ref 19–32)
Calcium: 8.8 mg/dL (ref 8.4–10.5)
Chloride: 104 mEq/L (ref 96–112)
Creatinine, Ser: 0.62 mg/dL (ref 0.4–1.2)
GFR calc Af Amer: >60 mL/min (ref >60)
GFR calc non Af Amer: >60 mL/min (ref >60)
Glucose, Bld: 71 mg/dL (ref 70–99)
Potassium: 4.1 mEq/L (ref 3.5–5.1)
Sodium: 136 mEq/L (ref 135–145)
Total Bilirubin: 0.4 mg/dL (ref 0.3–1.2)
Total Protein: 5.5 g/dL — ABNORMAL LOW (ref 6.0–8.3)

## 2010-10-12 LAB — CBC
HCT: 27.3 % — ABNORMAL LOW (ref 36.0–46.0)
HCT: 29.1 % — ABNORMAL LOW (ref 36.0–46.0)
HCT: 37.1 % (ref 36.0–46.0)
Hemoglobin: 12.1 g/dL (ref 12.0–15.0)
Hemoglobin: 9.4 g/dL — ABNORMAL LOW (ref 12.0–15.0)
Hemoglobin: 9.8 g/dL — ABNORMAL LOW (ref 12.0–15.0)
MCHC: 32.7 g/dL (ref 30.0–36.0)
MCHC: 33.7 g/dL (ref 30.0–36.0)
MCHC: 34.5 g/dL (ref 30.0–36.0)
MCV: 90.9 fL (ref 78.0–100.0)
MCV: 91.6 fL (ref 78.0–100.0)
MCV: 91.9 fL (ref 78.0–100.0)
Platelets: 110 10*3/uL — ABNORMAL LOW (ref 150–400)
Platelets: 121 10*3/uL — ABNORMAL LOW (ref 150–400)
Platelets: 130 10*3/uL — ABNORMAL LOW (ref 150–400)
RBC: 3 MIL/uL — ABNORMAL LOW (ref 3.87–5.11)
RBC: 3.18 MIL/uL — ABNORMAL LOW (ref 3.87–5.11)
RBC: 4.03 MIL/uL (ref 3.87–5.11)
RBC: 3.56 MIL/uL — ABNORMAL LOW (ref 3.87–5.11)
RDW: 13.3 % (ref 11.5–15.5)
RDW: 13.5 % (ref 11.5–15.5)
RDW: 13.6 % (ref 11.5–15.5)
WBC: 10.3 10*3/uL (ref 4.0–10.5)
WBC: 13.3 10*3/uL — ABNORMAL HIGH (ref 4.0–10.5)
WBC: 9.3 10*3/uL (ref 4.0–10.5)
WBC: 10.1 10*3/uL (ref 4.0–10.5)

## 2010-10-12 LAB — POCT URINALYSIS DIP (DEVICE)
Bilirubin Urine: NEGATIVE
Bilirubin Urine: NEGATIVE
Glucose, UA: NEGATIVE mg/dL
Hgb urine dipstick: NEGATIVE
Ketones, ur: NEGATIVE mg/dL
Ketones, ur: NEGATIVE mg/dL
Nitrite: NEGATIVE
Protein, ur: NEGATIVE mg/dL
Specific Gravity, Urine: 1.015 (ref 1.005–1.030)
Urobilinogen, UA: 0.2 mg/dL (ref 0.0–1.0)
pH: 6.5 (ref 5.0–8.0)
pH: 5.5 (ref 5.0–8.0)

## 2010-10-12 LAB — RUBELLA SCREEN: Rubella: 63.8 IU/mL — ABNORMAL HIGH

## 2010-10-12 LAB — URINALYSIS, ROUTINE W REFLEX MICROSCOPIC
Bilirubin Urine: NEGATIVE
Glucose, UA: NEGATIVE mg/dL
Ketones, ur: NEGATIVE mg/dL
Ketones, ur: NEGATIVE mg/dL
Nitrite: NEGATIVE
Protein, ur: NEGATIVE mg/dL
Protein, ur: NEGATIVE mg/dL
Specific Gravity, Urine: 1.015 (ref 1.005–1.030)
Urobilinogen, UA: 0.2 mg/dL (ref 0.0–1.0)

## 2010-10-12 LAB — HIV ANTIBODY (ROUTINE TESTING W REFLEX): HIV: NONREACTIVE

## 2010-10-12 LAB — LACTATE DEHYDROGENASE: LDH: 213 U/L (ref 94–250)

## 2010-10-12 LAB — DIFFERENTIAL
Basophils Relative: 0 % (ref 0–1)
Lymphocytes Relative: 11 % — ABNORMAL LOW (ref 12–46)
Lymphs Abs: 1.1 10*3/uL (ref 0.7–4.0)
Monocytes Relative: 8 % (ref 3–12)
Neutro Abs: 8 10*3/uL — ABNORMAL HIGH (ref 1.7–7.7)
Neutrophils Relative %: 80 % — ABNORMAL HIGH (ref 43–77)

## 2010-10-12 LAB — RPR
RPR Ser Ql: NONREACTIVE
RPR Ser Ql: NONREACTIVE

## 2010-10-12 LAB — GC/CHLAMYDIA PROBE AMP, URINE: GC Probe Amp, Urine: NEGATIVE

## 2010-10-12 LAB — URIC ACID: Uric Acid, Serum: 5.4 mg/dL (ref 2.4–7.0)

## 2010-10-12 LAB — SICKLE CELL SCREEN: Sickle Cell Screen: POSITIVE — AB

## 2010-10-16 LAB — POCT URINALYSIS DIP (DEVICE)
Bilirubin Urine: NEGATIVE
Bilirubin Urine: NEGATIVE
Bilirubin Urine: NEGATIVE
Bilirubin Urine: NEGATIVE
Glucose, UA: NEGATIVE mg/dL
Glucose, UA: NEGATIVE mg/dL
Glucose, UA: NEGATIVE mg/dL
Glucose, UA: NEGATIVE mg/dL
Hgb urine dipstick: NEGATIVE
Ketones, ur: NEGATIVE mg/dL
Ketones, ur: NEGATIVE mg/dL
Ketones, ur: NEGATIVE mg/dL
Ketones, ur: NEGATIVE mg/dL
Nitrite: NEGATIVE
Specific Gravity, Urine: 1.015 (ref 1.005–1.030)
Specific Gravity, Urine: 1.015 (ref 1.005–1.030)
pH: 6 (ref 5.0–8.0)

## 2010-10-16 LAB — GLUCOSE, CAPILLARY
Glucose-Capillary: 75 mg/dL (ref 70–99)
Glucose-Capillary: 78 mg/dL (ref 70–99)

## 2010-10-16 LAB — POCT PREGNANCY, URINE: Preg Test, Ur: POSITIVE

## 2010-10-28 LAB — POCT URINALYSIS DIP (DEVICE)
Bilirubin Urine: NEGATIVE
Glucose, UA: NEGATIVE mg/dL
Ketones, ur: NEGATIVE mg/dL
pH: 8.5 — ABNORMAL HIGH (ref 5.0–8.0)

## 2010-12-09 ENCOUNTER — Ambulatory Visit (INDEPENDENT_AMBULATORY_CARE_PROVIDER_SITE_OTHER): Payer: Medicaid Other

## 2010-12-09 DIAGNOSIS — Z3049 Encounter for surveillance of other contraceptives: Secondary | ICD-10-CM

## 2010-12-09 NOTE — Group Therapy Note (Signed)
NAMEVELENCIA, Lindsay Schneider NO.:  [id number]   MEDICAL RECORD NO.:  [id number]          PATIENT TYPE:  WOC   LOCATION:  [facility]                   FACILITY:  [facility]   PHYSICIAN:  Elsie Lincoln, MD      DATE OF BIRTH:  1986/06/15   DATE OF SERVICE:  12/06/2005                                    CLINIC NOTE   The patient is a 24 year old female who delivered on November 10, 2005, and  suffered a fourth degree laceration.  The repair was uncomplicated and done  in multiple layers with 3-0 and 4-0 Vicryl.  The patient also had negative  rectal exam after repair.  She was instructed nothing per rectum for 6  weeks; also, nothing per vagina for 6 weeks.  She says she has not had  constipation.  She did come to the emergency room on April 27 complaining of  pain at her stitches and she was noted to have some slight separation at the  anal verge.  She was not leaking stool at that time.  Today the patient  reports no perineal pain.  She has no incontinence of flatus or stool and  has no foul-smelling or abnormal-colored discharge from the vagina.  The  patient is completely asymptomatic at this point.   PHYSICAL EXAMINATION:  Vagina well healed, pink, normal rugae.  The perineum  is slightly split at the anal verge and there is a slight separation of the  anal sphincter inferiorly.  However, when the patient is asked to squeeze  her anal sphincter she is able to close her anus and has good tone.  There  is no evidence of rectovaginal fistula either.   I did ask [name] to come down and evaluate her with me and he agreed  with the above physical exam.  The patient is to go ahead and have her  normal postpartum exam at [facility] in a week and continue Depo-  Provera.  She is still to avoid constipation for several more weeks to  months as complete healing has not occurred.  The patient is to come back to  me in 6 months for another look at this area.  If the patient at  any time  becomes incontinent of stool or flatus or has abnormal discharge she is to  come sooner.           ______________________________  Elsie Lincoln, MD     KL/MEDQ  D:  12/06/2005  T:  12/07/2005  Job:  [id number]

## 2010-12-09 NOTE — Group Therapy Note (Signed)
NAMEAALEEYAH, BIAS NO.:  [id number]   MEDICAL RECORD NO.:  [id number]          PATIENT TYPE:  WOC   LOCATION:  [facility]                   FACILITY:  [facility]   PHYSICIAN:  Argentina Donovan, MD        DATE OF BIRTH:  Sep 30, 1985   DATE OF SERVICE:                                  CLINIC NOTE   The patient is a 25 year old African-American female who delivered a  baby in April of 2007. Had a fourth degree laceration for which she was  seen the following month and seemed to be healing well. She came in  today because she has some white spots in the area of the vagina as well  as an abnormality in that area.   EXAMINATION:  She has multiple small condylomata acuminata just in the  perineum, mainly to the left but some individual ones to the right of  the fourchette of the vagina.  The fourchette of the vagina is significant because of a tiny fistula  that goes from the vagina through a small band to what looks like the  external portion of the anal opening. My feeling is this could be  probably easily corrected. However I think it should be done in the  operating room so it can be reevaluated and perhaps revised. In the  interim I think we should try to get rid of the condyloma acuminata,  which I did by treating with 80% trichloracetic acid.   The patient will return in 3 weeks for reevaluation. We have counseled  her to stop smoking though she only smokes a pack of cigarettes every 6  days.   A Pap smear was deferred because of the condylomata and will be done on  the next visit.   IMPRESSION:  1. Condylomata acuminata.  2. Vaginal anal fistula.           ______________________________  Argentina Donovan, MD     PR/MEDQ  D:  09/12/2006  T:  09/12/2006  Job:  [id number]

## 2010-12-22 ENCOUNTER — Ambulatory Visit: Payer: Medicaid Other | Admitting: *Deleted

## 2010-12-26 ENCOUNTER — Ambulatory Visit: Payer: Medicaid Other | Attending: Neurology | Admitting: *Deleted

## 2010-12-26 DIAGNOSIS — IMO0001 Reserved for inherently not codable concepts without codable children: Secondary | ICD-10-CM | POA: Insufficient documentation

## 2010-12-26 DIAGNOSIS — M6281 Muscle weakness (generalized): Secondary | ICD-10-CM | POA: Insufficient documentation

## 2010-12-26 DIAGNOSIS — G35 Multiple sclerosis: Secondary | ICD-10-CM | POA: Insufficient documentation

## 2010-12-26 DIAGNOSIS — M2420 Disorder of ligament, unspecified site: Secondary | ICD-10-CM | POA: Insufficient documentation

## 2010-12-26 DIAGNOSIS — M629 Disorder of muscle, unspecified: Secondary | ICD-10-CM | POA: Insufficient documentation

## 2010-12-26 DIAGNOSIS — R269 Unspecified abnormalities of gait and mobility: Secondary | ICD-10-CM | POA: Insufficient documentation

## 2011-01-04 ENCOUNTER — Ambulatory Visit: Payer: Medicaid Other | Admitting: Physical Therapy

## 2011-01-06 ENCOUNTER — Ambulatory Visit: Payer: Medicaid Other | Admitting: Physical Therapy

## 2011-01-09 ENCOUNTER — Ambulatory Visit: Payer: Medicaid Other | Admitting: Physical Therapy

## 2011-01-13 ENCOUNTER — Ambulatory Visit: Payer: Medicaid Other | Admitting: Physical Therapy

## 2011-01-16 ENCOUNTER — Ambulatory Visit: Payer: Medicaid Other | Admitting: Physical Therapy

## 2011-01-18 ENCOUNTER — Ambulatory Visit: Payer: Medicaid Other | Admitting: Physical Therapy

## 2011-01-23 ENCOUNTER — Ambulatory Visit: Payer: Medicaid Other | Admitting: *Deleted

## 2011-01-27 ENCOUNTER — Ambulatory Visit: Payer: Medicaid Other | Admitting: *Deleted

## 2011-02-24 ENCOUNTER — Ambulatory Visit (INDEPENDENT_AMBULATORY_CARE_PROVIDER_SITE_OTHER): Payer: Medicaid Other | Admitting: Obstetrics and Gynecology

## 2011-02-24 VITALS — BP 101/66 | HR 63

## 2011-02-24 DIAGNOSIS — Z3049 Encounter for surveillance of other contraceptives: Secondary | ICD-10-CM

## 2011-02-24 MED ORDER — MEDROXYPROGESTERONE ACETATE 150 MG/ML IM SUSP
150.0000 mg | Freq: Once | INTRAMUSCULAR | Status: AC
  Administered 2011-02-24: 150 mg via INTRAMUSCULAR

## 2011-04-27 DIAGNOSIS — G35 Multiple sclerosis: Secondary | ICD-10-CM | POA: Insufficient documentation

## 2011-05-08 LAB — POCT URINALYSIS DIP (DEVICE)
Ketones, ur: NEGATIVE
Nitrite: NEGATIVE
Operator id: 116391
Protein, ur: 30 — AB
Urobilinogen, UA: 4 — ABNORMAL HIGH
pH: 6

## 2011-05-08 LAB — WET PREP, GENITAL: WBC, Wet Prep HPF POC: NONE SEEN

## 2011-05-08 LAB — HERPES SIMPLEX VIRUS CULTURE: Culture: DETECTED

## 2011-05-08 LAB — POCT PREGNANCY, URINE
Operator id: 235561
Preg Test, Ur: NEGATIVE

## 2011-05-12 ENCOUNTER — Ambulatory Visit (INDEPENDENT_AMBULATORY_CARE_PROVIDER_SITE_OTHER): Payer: Medicaid Other | Admitting: *Deleted

## 2011-05-12 VITALS — BP 122/78

## 2011-05-12 DIAGNOSIS — Z3049 Encounter for surveillance of other contraceptives: Secondary | ICD-10-CM

## 2011-05-12 MED ORDER — MEDROXYPROGESTERONE ACETATE 150 MG/ML IM SUSP
150.0000 mg | Freq: Once | INTRAMUSCULAR | Status: AC
  Administered 2011-05-12: 150 mg via INTRAMUSCULAR

## 2011-07-28 ENCOUNTER — Ambulatory Visit: Payer: Medicaid Other

## 2011-07-31 ENCOUNTER — Ambulatory Visit (INDEPENDENT_AMBULATORY_CARE_PROVIDER_SITE_OTHER): Payer: Medicaid Other | Admitting: *Deleted

## 2011-07-31 VITALS — BP 121/66 | HR 84

## 2011-07-31 DIAGNOSIS — Z3049 Encounter for surveillance of other contraceptives: Secondary | ICD-10-CM

## 2011-07-31 MED ORDER — MEDROXYPROGESTERONE ACETATE 150 MG/ML IM SUSP
150.0000 mg | Freq: Once | INTRAMUSCULAR | Status: AC
  Administered 2011-07-31: 150 mg via INTRAMUSCULAR

## 2011-10-27 ENCOUNTER — Ambulatory Visit: Payer: Medicaid Other

## 2011-10-31 ENCOUNTER — Ambulatory Visit (INDEPENDENT_AMBULATORY_CARE_PROVIDER_SITE_OTHER): Payer: Medicaid Other

## 2011-10-31 VITALS — BP 125/81 | HR 65

## 2011-10-31 DIAGNOSIS — Z3049 Encounter for surveillance of other contraceptives: Secondary | ICD-10-CM

## 2011-10-31 MED ORDER — MEDROXYPROGESTERONE ACETATE 150 MG/ML IM SUSP
150.0000 mg | Freq: Once | INTRAMUSCULAR | Status: AC
  Administered 2011-10-31: 150 mg via INTRAMUSCULAR

## 2011-12-13 ENCOUNTER — Ambulatory Visit: Payer: Medicaid Other | Admitting: Physical Therapy

## 2011-12-13 ENCOUNTER — Encounter: Payer: Medicaid Other | Admitting: Occupational Therapy

## 2011-12-21 ENCOUNTER — Ambulatory Visit: Payer: Medicaid Other | Attending: Neurology | Admitting: Physical Therapy

## 2011-12-21 DIAGNOSIS — M2420 Disorder of ligament, unspecified site: Secondary | ICD-10-CM | POA: Insufficient documentation

## 2011-12-21 DIAGNOSIS — M629 Disorder of muscle, unspecified: Secondary | ICD-10-CM | POA: Insufficient documentation

## 2011-12-21 DIAGNOSIS — M6281 Muscle weakness (generalized): Secondary | ICD-10-CM | POA: Insufficient documentation

## 2011-12-21 DIAGNOSIS — IMO0001 Reserved for inherently not codable concepts without codable children: Secondary | ICD-10-CM | POA: Insufficient documentation

## 2012-01-01 ENCOUNTER — Ambulatory Visit: Payer: Medicaid Other | Admitting: Physical Therapy

## 2012-01-19 ENCOUNTER — Ambulatory Visit (INDEPENDENT_AMBULATORY_CARE_PROVIDER_SITE_OTHER): Payer: Medicaid Other | Admitting: *Deleted

## 2012-01-19 VITALS — BP 110/68 | HR 80 | Temp 98.0°F

## 2012-01-19 DIAGNOSIS — Z3049 Encounter for surveillance of other contraceptives: Secondary | ICD-10-CM

## 2012-01-19 MED ORDER — MEDROXYPROGESTERONE ACETATE 150 MG/ML IM SUSP
150.0000 mg | Freq: Once | INTRAMUSCULAR | Status: AC
  Administered 2012-01-19: 150 mg via INTRAMUSCULAR

## 2012-03-05 ENCOUNTER — Emergency Department (HOSPITAL_COMMUNITY)
Admission: EM | Admit: 2012-03-05 | Discharge: 2012-03-05 | Disposition: A | Discharge status: Home / Self Care | Payer: Medicaid Other | Attending: Emergency Medicine | Admitting: Emergency Medicine

## 2012-03-05 ENCOUNTER — Encounter (HOSPITAL_COMMUNITY): Payer: Self-pay | Admitting: Emergency Medicine

## 2012-03-05 DIAGNOSIS — R112 Nausea with vomiting, unspecified: Secondary | ICD-10-CM | POA: Insufficient documentation

## 2012-03-05 DIAGNOSIS — K0889 Other specified disorders of teeth and supporting structures: Secondary | ICD-10-CM

## 2012-03-05 DIAGNOSIS — G35 Multiple sclerosis: Secondary | ICD-10-CM | POA: Insufficient documentation

## 2012-03-05 DIAGNOSIS — K089 Disorder of teeth and supporting structures, unspecified: Secondary | ICD-10-CM | POA: Insufficient documentation

## 2012-03-05 HISTORY — DX: Multiple sclerosis: G35

## 2012-03-05 LAB — COMPREHENSIVE METABOLIC PANEL
AST: 14 U/L (ref 0–37)
Albumin: 4.7 g/dL (ref 3.5–5.2)
CO2: 21 mEq/L (ref 19–32)
Calcium: 9.6 mg/dL (ref 8.4–10.5)
Creatinine, Ser: 0.58 mg/dL (ref 0.50–1.10)
GFR calc non Af Amer: >90 mL/min (ref >90)
Total Protein: 7.6 g/dL (ref 6.0–8.3)

## 2012-03-05 LAB — URINALYSIS, ROUTINE W REFLEX MICROSCOPIC
Glucose, UA: NEGATIVE mg/dL
Leukocytes, UA: NEGATIVE
Protein, ur: NEGATIVE mg/dL

## 2012-03-05 LAB — CBC WITH DIFFERENTIAL/PLATELET
Basophils Absolute: 0 10*3/uL (ref 0.0–0.1)
Basophils Relative: 0 % (ref 0–1)
Eosinophils Absolute: 0 10*3/uL (ref 0.0–0.7)
Eosinophils Relative: 0 % (ref 0–5)
HCT: 37.1 % (ref 36.0–46.0)
MCH: 28.9 pg (ref 26.0–34.0)
MCHC: 34.8 g/dL (ref 30.0–36.0)
MCV: 83.2 fL (ref 78.0–100.0)
Monocytes Absolute: 0.6 10*3/uL (ref 0.1–1.0)
RDW: 13.5 % (ref 11.5–15.5)

## 2012-03-05 LAB — PREGNANCY, URINE: Preg Test, Ur: NEGATIVE

## 2012-03-05 MED ORDER — OXYCODONE-ACETAMINOPHEN 5-325 MG PO TABS: 2.0000 | ORAL_TABLET | ORAL | Status: AC | PRN

## 2012-03-05 MED ORDER — PENICILLIN G BENZATHINE 1200000 UNIT/2ML IM SUSP
1.2000 10*6.[IU] | Freq: Once | INTRAMUSCULAR | Status: AC
  Administered 2012-03-05: 1.2 10*6.[IU] via INTRAMUSCULAR
  Filled 2012-03-05: qty 2

## 2012-03-05 MED ORDER — ONDANSETRON 8 MG PO TBDP: 8.0000 mg | ORAL_TABLET | Freq: Three times a day (TID) | ORAL | Status: AC | PRN

## 2012-03-05 MED ORDER — OXYCODONE-ACETAMINOPHEN 5-325 MG PO TABS
2.0000 | ORAL_TABLET | Freq: Once | ORAL | Status: AC
  Administered 2012-03-05: 2 via ORAL
  Filled 2012-03-05: qty 2

## 2012-03-05 MED ORDER — SODIUM CHLORIDE 0.9 % IV BOLUS (SEPSIS)
1000.0000 mL | Freq: Once | INTRAVENOUS | Status: AC
  Administered 2012-03-05: 1000 mL via INTRAVENOUS

## 2012-03-05 MED ORDER — ONDANSETRON HCL 4 MG/2ML IJ SOLN
4.0000 mg | Freq: Once | INTRAMUSCULAR | Status: AC
  Administered 2012-03-05: 4 mg via INTRAVENOUS
  Filled 2012-03-05: qty 2

## 2012-03-05 NOTE — ED Provider Notes (Signed)
Medical screening examination/treatment/procedure(s) were conducted as a shared visit with non-physician practitioner(s) and myself.  I personally evaluated the patient during the encounter  Robbyn Hodkinson T Saron Vanorman, MD 03/05/12 2343 

## 2012-03-05 NOTE — ED Notes (Signed)
MD at bedside. 

## 2012-03-05 NOTE — ED Notes (Signed)
Pt reports tooth pain x2 days, and n/v that began today. Reports vomiting multiple times, "yellow, red, black, and green" vomit noted.

## 2012-03-05 NOTE — ED Notes (Signed)
MD left bedside

## 2012-03-05 NOTE — ED Provider Notes (Signed)
26 year old female with hx of MS presents c/o of n/v/bloody bilious emesis and dental pain for the past few days.  Pt to move to main room for further evaluation of her n/v.    Fayrene Helper, PA-C 03/05/12 1743

## 2012-03-05 NOTE — ED Notes (Signed)
Pt reports having N/V since today. Large emesis x4 in past 24 hours went yellow to red (second emesis) to black (third emesis) and green (in ED). Reports taking nothing for the emesis. Pt reports scratching left arm today which turned black. Pt reports dental pain on left side of teeth and gums for 3 days which exacerbated today. Pt reports taking Tylenol for it. Denies SOB and difficulty swallowing. Denies diarrhea.

## 2012-03-05 NOTE — ED Provider Notes (Addendum)
History     CSN: [id number]  Arrival date & time 03/05/12  1546   First MD Initiated Contact with Patient 03/05/12 1731      Chief Complaint  Patient presents with  . Dental Pain    lower left dental pain  . Nausea  . Emesis    (Consider location/radiation/quality/duration/timing/severity/associated sxs/prior treatment) Patient is a 26 y.o. female presenting with tooth pain and vomiting. The history is provided by the patient.  Dental Pain  Emesis    patient presents with nausea vomiting x1 day. Also notes left upper tooth pain. Denies any fever or abdominal pain. Vomiting has been without abdominal pain. Denies any urinary symptoms. No vaginal bleeding or discharge. Nothing makes her symptoms better or worse. No medications used prior to arrival. Denies any tooth drainage or intra-oral abscess.  Past Medical History  Diagnosis Date  . MS (multiple sclerosis)     Past Surgical History  Procedure Date  . Cesarean section     Family History  Problem Relation Age of Onset  . Diabetes Mother     History  Substance Use Topics  . Smoking status: Never Smoker   . Smokeless tobacco: Not on file  . Alcohol Use: No    OB History    Grav Para Term Preterm Abortions TAB SAB Ect Mult Living                  Review of Systems  Gastrointestinal: Positive for vomiting.  All other systems reviewed and are negative.    Allergies  Review of patient's allergies indicates no known allergies.  Home Medications   Current Outpatient Rx  Name Route Sig Dispense Refill  . MEDROXYPROGESTERONE ACETATE 150 MG/ML IM SUSP Intramuscular Inject 150 mg into the muscle every 3 (three) months.      BP 144/88  Pulse 65  Temp 98.4 F (36.9 C) (Oral)  Resp 18  SpO2 100%  LMP 03/05/2012  Physical Exam  Nursing note and vitals reviewed. Constitutional: She is oriented to person, place, and time. She appears well-developed and well-nourished.  Non-toxic appearance. No distress.    HENT:  Head: Normocephalic and atraumatic.  Mouth/Throat: Abnormal dentition. Dental caries present.  Eyes: Conjunctivae, EOM and lids are normal. Pupils are equal, round, and reactive to light.  Neck: Normal range of motion. Neck supple. No tracheal deviation present. No mass present.  Cardiovascular: Normal rate, regular rhythm and normal heart sounds.  Exam reveals no gallop.   No murmur heard. Pulmonary/Chest: Effort normal and breath sounds normal. No stridor. No respiratory distress. She has no decreased breath sounds. She has no wheezes. She has no rhonchi. She has no rales.  Abdominal: Soft. Normal appearance and bowel sounds are normal. She exhibits no distension. There is no tenderness. There is no rigidity, no rebound, no guarding and no CVA tenderness.  Musculoskeletal: Normal range of motion. She exhibits no edema and no tenderness.  Neurological: She is alert and oriented to person, place, and time. She has normal strength. No cranial nerve deficit or sensory deficit. GCS eye subscore is 4. GCS verbal subscore is 5. GCS motor subscore is 6.  Skin: Skin is warm and dry. No abrasion and no rash noted.  Psychiatric: She has a normal mood and affect. Her speech is normal and behavior is normal.    ED Course  Procedures (including critical care time)   Labs Reviewed  CBC WITH DIFFERENTIAL  COMPREHENSIVE METABOLIC PANEL  LIPASE, BLOOD  URINALYSIS, ROUTINE W  REFLEX MICROSCOPIC  PREGNANCY, URINE   No results found.   No diagnosis found.    MDM  Patient given IV fluids for her dehydration. Pain medications given for her dental infection. She was also given a dose of Bicillin and referral to the dentist on call        Toy Baker, MD 03/05/12 2203  Toy Baker, MD 03/05/12 2206

## 2012-03-05 NOTE — ED Notes (Signed)
Pt reports N/V since this am. Recurrent dental pain x 3 days

## 2012-03-05 NOTE — ED Notes (Signed)
HCG was reported negative by lab.

## 2012-04-12 ENCOUNTER — Ambulatory Visit: Payer: Medicaid Other

## 2012-05-03 ENCOUNTER — Ambulatory Visit (INDEPENDENT_AMBULATORY_CARE_PROVIDER_SITE_OTHER): Payer: Medicaid Other | Admitting: Obstetrics and Gynecology

## 2012-05-03 VITALS — BP 118/78 | HR 78 | Wt 132.0 lb

## 2012-05-03 DIAGNOSIS — Z3049 Encounter for surveillance of other contraceptives: Secondary | ICD-10-CM

## 2012-05-03 MED ORDER — MEDROXYPROGESTERONE ACETATE 150 MG/ML IM SUSP
150.0000 mg | INTRAMUSCULAR | Status: DC
  Administered 2012-05-03: 150 mg via INTRAMUSCULAR

## 2012-05-03 NOTE — Progress Notes (Signed)
Patient states last unprotected intercourse was more than 2 weeks ago. Pregnancy test is negative.

## 2012-07-22 ENCOUNTER — Ambulatory Visit (INDEPENDENT_AMBULATORY_CARE_PROVIDER_SITE_OTHER): Payer: Medicaid Other | Admitting: General Practice

## 2012-07-22 VITALS — BP 118/65 | HR 74 | Temp 97.2°F | Ht 61.0 in | Wt 136.0 lb

## 2012-07-22 DIAGNOSIS — Z3049 Encounter for surveillance of other contraceptives: Secondary | ICD-10-CM

## 2012-07-22 MED ORDER — MEDROXYPROGESTERONE ACETATE 150 MG/ML IM SUSP
150.0000 mg | Freq: Once | INTRAMUSCULAR | Status: AC
  Administered 2012-07-22: 150 mg via INTRAMUSCULAR

## 2012-10-07 ENCOUNTER — Ambulatory Visit: Payer: Medicaid Other

## 2012-11-05 ENCOUNTER — Ambulatory Visit: Payer: Medicaid Other

## 2012-11-06 ENCOUNTER — Ambulatory Visit (INDEPENDENT_AMBULATORY_CARE_PROVIDER_SITE_OTHER): Payer: Medicaid Other | Admitting: General Practice

## 2012-11-06 ENCOUNTER — Other Ambulatory Visit: Payer: Self-pay | Admitting: Obstetrics and Gynecology

## 2012-11-06 VITALS — BP 113/73 | HR 91 | Temp 97.6°F | Ht 61.0 in | Wt 140.4 lb

## 2012-11-06 DIAGNOSIS — Z3049 Encounter for surveillance of other contraceptives: Secondary | ICD-10-CM

## 2012-11-06 MED ORDER — MEDROXYPROGESTERONE ACETATE 150 MG/ML IM SUSP
150.0000 mg | Freq: Once | INTRAMUSCULAR | Status: AC
  Administered 2012-11-06: 150 mg via INTRAMUSCULAR

## 2012-11-06 NOTE — Progress Notes (Signed)
Patient missed march appt for shot- has not had unprotected sex in last 2 weeks, obtained a negative UPT then gave depo accordingly

## 2013-01-22 ENCOUNTER — Ambulatory Visit (INDEPENDENT_AMBULATORY_CARE_PROVIDER_SITE_OTHER): Payer: Medicaid Other | Admitting: General Practice

## 2013-01-22 VITALS — BP 124/74 | HR 84 | Temp 97.1°F | Ht 62.0 in | Wt 142.1 lb

## 2013-01-22 DIAGNOSIS — Z3049 Encounter for surveillance of other contraceptives: Secondary | ICD-10-CM

## 2013-01-22 MED ORDER — MEDROXYPROGESTERONE ACETATE 150 MG/ML IM SUSP
150.0000 mg | Freq: Once | INTRAMUSCULAR | Status: AC
  Administered 2013-01-22: 150 mg via INTRAMUSCULAR

## 2013-03-28 ENCOUNTER — Telehealth: Payer: Self-pay | Admitting: *Deleted

## 2013-03-28 NOTE — Telephone Encounter (Signed)
Pt left message on nurse voice mail stating her name and DOB but did not state why she was calling.

## 2013-03-31 NOTE — Telephone Encounter (Signed)
Called patient, no answer- left message that we are returning your phone call, please call us back at the clinics

## 2013-04-07 NOTE — Telephone Encounter (Signed)
Called patient no answer- left message stating we are trying to return your phone call and previously have been unable to reach you, please call us back at the clinics if you need assistance

## 2013-04-09 ENCOUNTER — Ambulatory Visit: Payer: Medicaid Other

## 2013-04-10 ENCOUNTER — Ambulatory Visit: Payer: Medicaid Other

## 2013-04-11 ENCOUNTER — Ambulatory Visit: Payer: Medicaid Other

## 2013-04-24 ENCOUNTER — Ambulatory Visit (INDEPENDENT_AMBULATORY_CARE_PROVIDER_SITE_OTHER): Payer: Medicaid Other | Admitting: Obstetrics and Gynecology

## 2013-04-24 VITALS — BP 130/77 | HR 80 | Wt 149.0 lb

## 2013-04-24 DIAGNOSIS — Z3049 Encounter for surveillance of other contraceptives: Secondary | ICD-10-CM

## 2013-04-24 MED ORDER — MEDROXYPROGESTERONE ACETATE 150 MG/ML IM SUSP
150.0000 mg | Freq: Once | INTRAMUSCULAR | Status: AC
  Administered 2013-04-24: 150 mg via INTRAMUSCULAR

## 2013-05-30 ENCOUNTER — Ambulatory Visit: Payer: Medicaid Other | Admitting: Medical

## 2013-07-11 ENCOUNTER — Ambulatory Visit (INDEPENDENT_AMBULATORY_CARE_PROVIDER_SITE_OTHER): Payer: Medicaid Other | Admitting: Family Medicine

## 2013-07-11 ENCOUNTER — Other Ambulatory Visit (HOSPITAL_COMMUNITY)
Admission: RE | Admit: 2013-07-11 | Discharge: 2013-07-11 | Disposition: A | Discharge status: Home / Self Care | Payer: Medicaid Other | Source: Ambulatory Visit | Attending: Family Medicine | Admitting: Family Medicine

## 2013-07-11 ENCOUNTER — Ambulatory Visit: Payer: Medicaid Other

## 2013-07-11 ENCOUNTER — Encounter: Payer: Self-pay | Admitting: Family Medicine

## 2013-07-11 VITALS — BP 121/70 | HR 97 | Temp 97.0°F | Ht 60.0 in | Wt 151.2 lb

## 2013-07-11 DIAGNOSIS — Z3049 Encounter for surveillance of other contraceptives: Secondary | ICD-10-CM

## 2013-07-11 DIAGNOSIS — Z01419 Encounter for gynecological examination (general) (routine) without abnormal findings: Secondary | ICD-10-CM

## 2013-07-11 DIAGNOSIS — Z113 Encounter for screening for infections with a predominantly sexual mode of transmission: Secondary | ICD-10-CM | POA: Insufficient documentation

## 2013-07-11 DIAGNOSIS — Z1151 Encounter for screening for human papillomavirus (HPV): Secondary | ICD-10-CM | POA: Insufficient documentation

## 2013-07-11 MED ORDER — MEDROXYPROGESTERONE ACETATE 104 MG/0.65ML ~~LOC~~ SUSP
104.0000 mg | Freq: Once | SUBCUTANEOUS | Status: AC
  Administered 2013-07-11: 104 mg via SUBCUTANEOUS

## 2013-07-11 NOTE — Patient Instructions (Signed)
So nice to meet you We will let you know your results Take care of yourself.

## 2013-07-11 NOTE — Progress Notes (Signed)
GYN OFFICE NOTE  Chief Complaint:  Routine gyn exam  Primary Care Physician: No primary provider on file.  HPI:  Lindsay Schneider is here for a routine GYN exam.   - no problems - no changes to medical hx.  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  - wants a breast exam today.    PMHx:  Past Medical History  Diagnosis Date  . MS (multiple sclerosis)     Past Surgical History  Procedure Laterality Date  . Cesarean section      FAMHx:  Family History  Problem Relation Age of Onset  . Diabetes Mother     SOCHx:   reports that she has never smoked. She does not have any smokeless tobacco history on file. She reports that she does not drink alcohol. Her drug history is not on file.  ALLERGIES:  No Known Allergies  ROS: Pertinent ROS as seen in HPI. Otherwise negative.   HOME MEDS: Current Outpatient Prescriptions  Medication Sig Dispense Refill  . baclofen (LIORESAL) 5 mg TABS Take 10 mg by mouth 3 (three) times daily.      . Dalfampridine (AMPYRA PO) Take by mouth.      . medroxyPROGESTERone (DEPO-PROVERA) 150 MG/ML injection Inject 150 mg into the muscle every 3 (three) months.       No current facility-administered medications for this visit.    LABS/IMAGING: No results found for this or any previous visit (from the past 48 hour(s)). No results found.  VITALS: BP 121/70  Pulse 97  Temp(Src) 97 F (36.1 C) (Oral)  Ht 5' (1.524 m)  Wt 151 lb 3.2 oz (68.584 kg)  BMI 29.53 kg/m2  EXAM: GENERAL: Well-developed, well-nourished female in no acute distress.  HEENT: Normocephalic, atraumatic. Sclerae anicteric.  NECK: Supple. Normal thyroid.  LUNGS: Clear to auscultation bilaterally.  HEART: Regular rate and rhythm. BREASTS: Symmetric in size. No masses, skin changes, nipple drainage, or lymphadenopathy. ABDOMEN: Soft, nontender, nondistended. No organomegaly. PELVIC: Normal external female genitalia. Vagina is pink and rugated.  Normal  discharge. Normal cervix contour. Pap smear obtained. Uterus is normal in size. No adnexal mass or tenderness.  EXTREMITIES: No cyanosis, clubbing, or edema, 2+ distal pulses. Significant muscle weakness L>R due to MS.   ASSESSMENT: Well woman exam with routine gynecological exam - Plan: Cytology - PAP  Surveillance of other previously prescribed contraceptive method - Plan: medroxyPROGESTERone (DEPO-SUBQ PROVERA) injection 104 mg  PLAN: - PAP today - breast exam normal -gc/chl collected - lab work done regularly by neurologist due to medication -praised for healthy lifestyle currently - f/u in 1 year or sooner as needed.

## 2013-07-15 ENCOUNTER — Telehealth: Payer: Self-pay

## 2013-07-15 NOTE — Telephone Encounter (Signed)
Called pt. And stated we are calling with some good results, non-urgent, call clinic.

## 2013-07-15 NOTE — Telephone Encounter (Signed)
Message copied by Louanna Raw on Tue Jul 15, 2013  9:43 AM ------      Message from: [place]      Created: Mon Jul 14, 2013 10:53 PM       Can you let her know that her pap is negative? Thanks so much. ------

## 2013-07-15 NOTE — Telephone Encounter (Signed)
Called pt. And informed her of negative pap smear and no need for follow up. Pt. Verbalized understanding and gratitude and had no other questions or concerns.

## 2013-07-24 ENCOUNTER — Encounter (HOSPITAL_COMMUNITY): Payer: Self-pay | Admitting: Emergency Medicine

## 2013-07-24 ENCOUNTER — Emergency Department (HOSPITAL_COMMUNITY)
Admission: EM | Admit: 2013-07-24 | Discharge: 2013-07-24 | Disposition: A | Discharge status: Home / Self Care | Payer: Medicaid Other | Attending: Emergency Medicine | Admitting: Emergency Medicine

## 2013-07-24 DIAGNOSIS — Z79899 Other long term (current) drug therapy: Secondary | ICD-10-CM | POA: Insufficient documentation

## 2013-07-24 DIAGNOSIS — B9789 Other viral agents as the cause of diseases classified elsewhere: Secondary | ICD-10-CM

## 2013-07-24 DIAGNOSIS — J069 Acute upper respiratory infection, unspecified: Secondary | ICD-10-CM | POA: Insufficient documentation

## 2013-07-24 DIAGNOSIS — G35 Multiple sclerosis: Secondary | ICD-10-CM | POA: Insufficient documentation

## 2013-07-24 MED ORDER — IBUPROFEN 400 MG PO TABS
800.0000 mg | ORAL_TABLET | Freq: Once | ORAL | Status: AC
  Administered 2013-07-24: 800 mg via ORAL
  Filled 2013-07-24: qty 2

## 2013-07-24 NOTE — ED Provider Notes (Signed)
Medical screening examination/treatment/procedure(s) were performed by non-physician practitioner and as supervising physician I was immediately available for consultation/collaboration.  EKG Interpretation   None         Camil Wilhelmsen H Balian Schaller, MD 07/24/13 2357 

## 2013-07-24 NOTE — ED Provider Notes (Signed)
CSN: [id number]     Arrival date & time 07/24/13  1701 History  This chart was scribed for non-physician practitioner Sharilyn SitesLisa Sanders, PA-C, working with Richardean Canalavid H Yao, MD by Dorothey Basemania Sutton, ED Scribe. This patient was seen in room TR10C/TR10C and the patient's care was started at 6:47 PM.    Chief Complaint  Patient presents with  . URI   The history is provided by the patient. No language interpreter was used.   HPI Comments: Lindsay Schneider is a 28 y.o. female who presents to the Emergency Department complaining of URI-like symptoms including headache, dry hacking cough, and rhinorrhea onset yesterday. No fevers, sweats, or chills.  She denies taking any medications at home to treat her symptoms. She reports that she has been exposed to sick contacts with similar symptoms at home-- both young children are sick. Patient has a history of multiple sclerosis.   Past Medical History  Diagnosis Date  . MS (multiple sclerosis)    Past Surgical History  Procedure Laterality Date  . Cesarean section     Family History  Problem Relation Age of Onset  . Diabetes Mother    History  Substance Use Topics  . Smoking status: Never Smoker   . Smokeless tobacco: Not on file  . Alcohol Use: No   OB History   Grav Para Term Preterm Abortions TAB SAB Ect Mult Living                 Review of Systems  HENT: Positive for rhinorrhea.   Respiratory: Positive for cough.   Neurological: Positive for headaches.  All other systems reviewed and are negative.    A complete 10 system review of systems was obtained and all systems are negative except as noted in the HPI and PMH.   Allergies  Review of patient's allergies indicates no known allergies.  Home Medications   Current Outpatient Rx  Name  Route  Sig  Dispense  Refill  . baclofen (LIORESAL) 5 mg TABS   Oral   Take 10 mg by mouth 3 (three) times daily.         . Dalfampridine (AMPYRA PO)   Oral   Take by mouth.         .  medroxyPROGESTERone (DEPO-PROVERA) 150 MG/ML injection   Intramuscular   Inject 150 mg into the muscle every 3 (three) months.          Triage Vitals: BP 128/80  Pulse 111  Temp(Src) 98.9 F (37.2 C) (Oral)  Resp 20  SpO2 98%  Physical Exam  Nursing note and vitals reviewed. Constitutional: She is oriented to person, place, and time. She appears well-developed and well-nourished. No distress.  HENT:  Head: Normocephalic and atraumatic.  Right Ear: Hearing, tympanic membrane, external ear and ear canal normal.  Left Ear: Hearing, tympanic membrane, external ear and ear canal normal.  Nose: Nose normal.  Mouth/Throat: Uvula is midline, oropharynx is clear and moist and mucous membranes are normal. No oropharyngeal exudate, posterior oropharyngeal edema, posterior oropharyngeal erythema or tonsillar abscesses.  HEENT WNL  Eyes: Conjunctivae and EOM are normal. Pupils are equal, round, and reactive to light.  Neck: Normal range of motion. Neck supple.  Cardiovascular: Normal rate, regular rhythm and normal heart sounds.   Pulmonary/Chest: Effort normal and breath sounds normal. Not tachypneic. No respiratory distress. She has no wheezes. She has no rhonchi.  Lungs CTAB  Musculoskeletal: Normal range of motion.  Neurological: She is alert and oriented to  person, place, and time.  Skin: Skin is warm and dry. She is not diaphoretic.  Psychiatric: She has a normal mood and affect.    ED Course  Procedures (including critical care time)  DIAGNOSTIC STUDIES: Oxygen Saturation is 98% on room air, normal by my interpretation.    COORDINATION OF CARE: 6:48 PM- Discussed that symptoms are likely viral in nature. Will order Motrin to manage symptoms. Discussed treatment plan with patient at bedside and patient verbalized agreement.   Labs Review Labs Reviewed - No data to display Imaging Review No results found.  EKG Interpretation   None       MDM   1. Viral URI with cough     Constellation of sx likely viral in nature.  VS stable.  Headache without concerning focal neuro deficits, motrin given.  Encouraged to push fluids and rest.  Use OTC meds for sx control.  Return precauitons advised.  I personally performed the services described in this documentation, which was scribed in my presence. The recorded information has been reviewed and is accurate.  Garlon Hatchet, PA-C 07/24/13 1931

## 2013-07-24 NOTE — ED Notes (Signed)
Pt reports headache, cough, runny nose since yesterday. + sick contacts at home. Denies fever.

## 2013-07-24 NOTE — Discharge Instructions (Signed)
May use over the counter cough medications as needed. Take tylenol/motrin as needed for fever and headache. Return to the ED for new concerns.

## 2013-10-03 ENCOUNTER — Ambulatory Visit: Payer: Medicaid Other

## 2013-11-03 ENCOUNTER — Ambulatory Visit: Payer: Medicaid Other

## 2013-12-02 ENCOUNTER — Ambulatory Visit (INDEPENDENT_AMBULATORY_CARE_PROVIDER_SITE_OTHER): Payer: Medicaid Other | Admitting: *Deleted

## 2013-12-02 VITALS — BP 123/83 | HR 73 | Temp 98.6°F | Wt 156.4 lb

## 2013-12-02 DIAGNOSIS — IMO0001 Reserved for inherently not codable concepts without codable children: Secondary | ICD-10-CM

## 2013-12-02 DIAGNOSIS — N390 Urinary tract infection, site not specified: Secondary | ICD-10-CM

## 2013-12-02 DIAGNOSIS — Z3049 Encounter for surveillance of other contraceptives: Secondary | ICD-10-CM

## 2013-12-02 LAB — POCT URINALYSIS DIP (DEVICE)
BILIRUBIN URINE: NEGATIVE
Glucose, UA: NEGATIVE mg/dL
KETONES UR: NEGATIVE mg/dL
Nitrite: POSITIVE — AB
PROTEIN: NEGATIVE mg/dL
Urobilinogen, UA: 0.2 mg/dL (ref 0.0–1.0)
pH: 5.5 (ref 5.0–8.0)

## 2013-12-02 LAB — POCT PREGNANCY, URINE: PREG TEST UR: NEGATIVE

## 2013-12-02 MED ORDER — MEDROXYPROGESTERONE ACETATE 104 MG/0.65ML ~~LOC~~ SUSP
104.0000 mg | Freq: Once | SUBCUTANEOUS | Status: AC
  Administered 2013-12-02: 104 mg via SUBCUTANEOUS

## 2013-12-02 MED ORDER — NITROFURANTOIN MONOHYD MACRO 100 MG PO CAPS: 100.0000 mg | ORAL_CAPSULE | Freq: Two times a day (BID) | ORAL | Status: DC

## 2013-12-04 ENCOUNTER — Other Ambulatory Visit: Payer: Self-pay | Admitting: Advanced Practice Midwife

## 2013-12-04 MED ORDER — SULFAMETHOXAZOLE-TMP DS 800-160 MG PO TABS: 1.0000 | ORAL_TABLET | Freq: Two times a day (BID) | ORAL | Status: DC

## 2013-12-05 ENCOUNTER — Telehealth: Payer: Self-pay

## 2013-12-05 LAB — URINE CULTURE

## 2013-12-05 MED ORDER — SULFAMETHOXAZOLE-TMP DS 800-160 MG PO TABS: 1.0000 | ORAL_TABLET | Freq: Two times a day (BID) | ORAL | Status: DC

## 2013-12-05 NOTE — Telephone Encounter (Signed)
Message copied by Louanna RawAMPBELL, Michalina Calbert M on Fri Dec 05, 2013 11:37 AM ------      Message from: Wynelle BourgeoisWILLIAMS, MARIE L      Created: Thu Dec 04, 2013  3:19 PM      Regarding: UTI Tx       This must have been a lab visit only   I did not see her                  + E Coli UTI            Tried to rx, but no pharmacy on file            I ordered Septra DS 1 po bid x 7 d            Can you tell her?            Marie ------

## 2013-12-05 NOTE — Telephone Encounter (Signed)
Prescription e-prescribed. Called pt. No answer. Left message informing her of UTI and prescription at pharmacy.

## 2014-03-04 ENCOUNTER — Ambulatory Visit: Payer: Medicaid Other

## 2014-05-19 ENCOUNTER — Telehealth: Payer: Self-pay | Admitting: *Deleted

## 2014-05-19 NOTE — Telephone Encounter (Signed)
Pt left message stating that she has been off Depo for 3 months and wants to know when she will get her cycle back. I returned pt's call and left message to call us back so that we can discuss the answer to her question.

## 2014-05-19 NOTE — Telephone Encounter (Signed)
Riko called and left another message stating she missed our call and requesting a call back.  Called [place] and we discussed she had been on depoprovera for about 4 years , now off for about 3 months and that each person is different as to when their cycle returns. We discussed the longer you were on depoprovera the longer it could take to get a cycle and to allow a few more months . If no cycle to call clinic to make appointment to be seen.

## 2014-06-05 ENCOUNTER — Ambulatory Visit: Payer: Medicaid Other | Admitting: Internal Medicine

## 2014-06-08 ENCOUNTER — Ambulatory Visit: Payer: Medicaid Other | Admitting: Family Medicine

## 2014-08-28 ENCOUNTER — Ambulatory Visit: Payer: Medicaid Other | Admitting: Obstetrics and Gynecology

## 2015-02-23 ENCOUNTER — Ambulatory Visit: Payer: Medicaid Other | Admitting: Physical Therapy

## 2015-02-23 ENCOUNTER — Ambulatory Visit: Payer: Medicaid Other | Admitting: Occupational Therapy

## 2015-02-24 ENCOUNTER — Encounter: Payer: Self-pay | Admitting: Physical Therapy

## 2015-02-24 ENCOUNTER — Ambulatory Visit: Payer: Medicaid Other | Attending: Internal Medicine | Admitting: Physical Therapy

## 2015-02-24 DIAGNOSIS — R293 Abnormal posture: Secondary | ICD-10-CM | POA: Diagnosis present

## 2015-02-24 DIAGNOSIS — M25372 Other instability, left ankle: Secondary | ICD-10-CM | POA: Insufficient documentation

## 2015-02-24 DIAGNOSIS — G35 Multiple sclerosis: Secondary | ICD-10-CM | POA: Insufficient documentation

## 2015-02-24 DIAGNOSIS — R258 Other abnormal involuntary movements: Secondary | ICD-10-CM | POA: Diagnosis present

## 2015-02-24 DIAGNOSIS — R29898 Other symptoms and signs involving the musculoskeletal system: Secondary | ICD-10-CM | POA: Diagnosis present

## 2015-02-24 DIAGNOSIS — R252 Cramp and spasm: Secondary | ICD-10-CM

## 2015-02-24 DIAGNOSIS — M25362 Other instability, left knee: Secondary | ICD-10-CM | POA: Diagnosis present

## 2015-02-24 NOTE — Therapy (Signed)
[facility] [address] [place], [place], [address] Phone: [phone]   Fax:  [phone]  Physical Therapy Evaluation  Patient Details  Name: Lindsay Schneider MRN: [id number] Date of Birth: [birth year] Referring Provider:  Dorothyann Peng, MD  Encounter Date: 02/24/2015      PT End of Session - 02/24/15 1708    Visit Number 1   Number of Visits 13  eval + 12 visits   Date for PT Re-Evaluation 04/14/15   Authorization Type Medicaid   PT Start Time 1538  Pt arrived late to session   PT Stop Time 1626   PT Time Calculation (min) 48 min   Equipment Utilized During Treatment Gait belt   Activity Tolerance Other (comment)  Pt limited by spasticity   Behavior During Therapy [facility] for tasks assessed/performed      Past Medical History  Diagnosis Date  . MS (multiple sclerosis)     Past Surgical History  Procedure Laterality Date  . Cesarean section      There were no vitals filed for this visit.  Visit Diagnosis:  Weakness of both legs - Plan: PT plan of care cert/re-cert  Spasticity - Plan: PT plan of care cert/re-cert  Posture abnormality - Plan: PT plan of care cert/re-cert  Knee instability, left - Plan: PT plan of care cert/re-cert  Ankle instability, left - Plan: PT plan of care cert/re-cert  Multiple sclerosis - Plan: PT plan of care cert/re-cert      Subjective Assessment - 02/24/15 1547    Subjective Pt reporting increasingly less independence with all aspects of functional mobility. Pt currently reqired assistance to roll in bed, to lie down and get up out of bed, and requires assistance of two family members to transfer into/out of transport chair. Pt has not been ambulatory for approximately 3 years. Pt utilizes transport chair as primary means of mobility. Pt reports difficulty using with walker due to L elbow stuck in flexion, L hand "all balled up", per pt.   Patient is accompained by: Family  member  daughter, Arma Heading   Limitations Standing;Walking;House hold activities   Patient Stated Goals "At least try to get my legs somewhat stronger so I'll be able to stand and kind of do stuff on my own."   Currently in Pain? No/denies            [facility] PT Assessment - 02/24/15 0001    Assessment   Medical Diagnosis B LE weakness; multiple sclerosis   Onset Date/Surgical Date 07/24/09   Precautions   Precautions Fall   Restrictions   Weight Bearing Restrictions No   Balance Screen   Has the patient fallen in the past 6 months No   Has the patient had a decrease in activity level because of a fear of falling?  Yes   Is the patient reluctant to leave their home because of a fear of falling?  Yes   Home Environment   Living Environment Private residence   Living Arrangements Children;Other relatives;Other (Comment)  grandparents   Available Help at Discharge Family  grandfather and 24 y/o daughter   Type of Home House   Home Access Ramped entrance   Home Layout One level   Home Equipment Walker - 2 wheels;Transport chair;Electric scooter   Additional Comments Per pt, "tub bench is broken; I don't use it because I'm too afraid"   Prior Function   Level of Independence Requires assistive device for independence  prior to functional  decline 3 years ago   Vocation On disability   Leisure go out places, shopping   Sensation   Light Touch Impaired by gross assessment   Proprioception Impaired by gross assessment   Additional Comments Light touch impaired in LLE. Proprioception appears to be impaired in BLE's (impairment in LLE > RLE).   Coordination   Gross Motor Movements are Fluid and Coordinated No   Fine Motor Movements are Fluid and Coordinated No   Heel Shin Test Unable to perform in BLE's secondary to hypertonia, weakness   Posture/Postural Control   Posture/Postural Control Postural limitations   Postural Limitations Rounded Shoulders;Increased thoracic  kyphosis;Flexed trunk;Weight shift right   Posture Comments In standing, pt's posture is characterized by limited B hip extension (due to fear of L genu recurvatum), L hip abductied, L knee in rigid hyperextension (due to spasticity in L quadriceps), and L forefoot supinated. Also noted lateral instability of R ankle during transitional movements   Tone   Assessment Location Left Lower Extremity;Other (comment)   Tone Assessment - Other   Other Tone Location Clonus in L ankle >30 beats (did not fatigue/stop as long as L ankle held in dorsiflexion)   Other Tone Location Comments Clonus in R ankle plantarflexors x17 beats   ROM / Strength   AROM / PROM / Strength PROM;Strength;AROM   AROM   Overall AROM  Deficits   Overall AROM Comments LLE: No active L hip/knee flexion, L knee extension, or ankle movement in all planes secondary to weakness, spasticity, and decreased motor control. R hip flexion limited to <50% normal ROM and knee extension/R knee flexion >50% normal ROM secondary to weakness and decreased motor control.    PROM   Overall PROM  Deficits   Overall PROM Comments Despite limited B hip extension in standing, pt able to reach grossly neutral hip extension bilaterally when supine on mat table. ROM in B knees, ankles grossly WFL in all planes, with exception of B ankle dorsiflexion PROM, which is limited by clonus.   Strength   Overall Strength Deficits;Other (comment)   Overall Strength Comments Difficult to formally assess strength secondary to spasticity (most severe in LLE) and decreased motor control in BLE's.   Strength Assessment Site Hip;Knee;Ankle   Right/Left Hip Right;Left   Right Hip Flexion 2-/5   Right Hip Extension 2-/5   Left Hip Flexion 1/5   Left Hip Extension 2-/5   Right/Left Knee Right;Left   Right Knee Flexion 3-/5   Right Knee Extension 3-/5   Left Knee Flexion 1/5   Left Knee Extension 2/5   Right/Left Ankle Right;Left   Right Ankle Dorsiflexion 3-/5    Right Ankle Plantar Flexion 3+/5   Left Ankle Dorsiflexion 0/5   Left Ankle Plantar Flexion 3-/5   Bed Mobility   Bed Mobility Supine to Sit;Sit to Supine;Scooting to HOB   Supine to Sit 2: Max assist   Supine to Sit Details (indicate cue type and reason) Pt required manual facilitation of L knee flexion, movement of LUE across midline, advancement of BLE's toward EOB. Pt able to transition from R side lying > seated with tactile cueing at R ribcage for trunk shortening.   Sit to Supine 2: Max assist   Sit to Supine - Details (indicate cue type and reason) Max A for BLE management   Scooting to [facility] 1: +1 Total assist   Scooting to [facility] Details (indicate cue type and reason) Even with manual facilitation of L hip/knee flexion, manual  stabilization of L foot, and tactile cueing at B knees for WB/proprioceptive input, pt unable to lift hips enough to effectively scoot to [facility]   Transfers   Transfers Sit to Stand;Stand to Sit;Stand Pivot Transfers;Squat Pivot Transfers   Sit to Stand 1: +2 Total assist   Sit to Stand Details Manual facilitation for weight shifting;Manual facilitation for placement;Manual facilitation for weight bearing;Verbal cues for technique;Tactile cues for weight shifting   Sit to Stand: Patient Percentage 30%   Stand to Sit 1: +2 Total assist   Stand to Sit: Patient Percentage 20%   Stand to Sit Details (indicate cue type and reason) Manual facilitation for weight shifting;Manual facilitation for placement   Stand Pivot Transfers 1: +2 Total assist   Stand Pivot Transfers: Patient Percentage 30%   Squat Pivot Transfers 1: +2 Total assist  +2 for safety   Squat Pivot Transfers: Patient Percentage 60%   Squat Pivot Transfer Details (indicate cue type and reason) setup, sequencing, hand placement, full anterior weight shift   Comments Even with assist of 2 therapists, stand pivot transfer unsafe secondary to inability to manually facilitate lateral weight shift (for pivoting)  due to rigid LLE extension.   Ambulation/Gait   Ambulation/Gait No   Gait Comments One step taken with +2A using L PFRW; however, unsafe and not functional means of functional mobility at this time.   LLE Tone   LLE Tone Modified Ashworth   LLE Tone   Modified Ashworth Scale for Grading Hypertonia LLE Slight increase in muscle tone, manifested by a catch, followed by minimal resistance throughout the remainder (less than half) of the ROM  quadriceps                           PT Education - 02/24/15 1639    Education provided Yes   Education Details PT evaluation findings, goals, and POC. Discussed realistic goals for this episode of PT with focus on transferring with less physical assistance and injury prevention during transfers.   Person(s) Educated Patient   Methods Explanation   Comprehension Verbalized understanding          PT Short Term Goals - 02/24/15 1733    PT SHORT TERM GOAL #1   Title Patient will perform home exercises/stretches with assist of caregiver to manage spasticity and maximize functional gains made in physical therapy. Target date: 03/24/15.   Baseline Patient and family completely dependent for home exercise plan.   Status New   PT SHORT TERM GOAL #2   Title Refer patient to orthotist to assess if patient appropriate candidate for bracing to ensure L ankle, L knee stability during functional transfers.  Target date: 03/24/15.   Status New           PT Long Term Goals - 02/24/15 1737    PT LONG TERM GOAL #1   Title Patient will perform squat pivot transfer from wheelchair <> mat table with mod A of single person to increase pt independence with funtional transfers and decrease physical burden on caregivers.  Target date: 04/14/15.   Baseline Patient currently requires Total assist of two persons for all functional tranfers.   Status New   PT LONG TERM GOAL #2   Title Pt will perform supine to/from sit with min A and 25% cueing to  indicate increased independence with bed mobility.  Target date: 04/14/15.   Baseline Pt currently requirea Max to Total A and 75% cueing with bed  mobility.   Status New   PT LONG TERM GOAL #3   Title Patient/caregivers will verbalize understanding of available equipment options and resources to maximize pt's current function as well as anticipated functional status.  Target date: 04/14/15.   Baseline Patient/caregivers currently dependent for information on available DME and resources appropriate for patient.   PT LONG TERM GOAL #4   Title Make referral for wheelchair evaluation for optimal positioning, spasticity management, to maximize pt independence with functional mobility, and to prevent future complications such as contractures and skin breakdown. Target date: 04/14/15.   Baseline Pt currently utilizes transport chair for seating, positioning, and as primary means of mobility. Transport chair does not maximize pt independence with mobility and places patient is poor alignment/positioning, which places pt at risk of complications described above.   Status New               Plan - 02/24/15 1712    Clinical Impression Statement Pt is a 29 y/o F presenting to outpatient PT with functional mobility limitations associated with multiple sclerosis diagnosed in 2011. 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 Pt requires Total A of 2 persons to perform all types of transfers from transport chair <> bed. Pt will benefit from skilled outpatient Pt twice per week for  6 weeks to increase stability/independence with functional mobility, decrease fall risk, manage spasticity, decrease  caregiver burden, and prevent injury during functional mobility.   Pt will benefit from skilled therapeutic intervention in order to improve on the following deficits Decreased mobility;Decreased balance;Decreased strength;Impaired sensation;Postural dysfunction;Impaired UE functional use;Increased muscle spasms;Impaired tone;Abnormal gait;Decreased coordination;Decreased activity tolerance;Decreased endurance;Decreased knowledge of use of DME;Decreased range of motion;Hypomobility   Rehab Potential Good   Clinical Impairments Affecting Rehab Potential Relies on family members for transportation; financial limitations   PT Frequency 2x / week   PT Duration 6 weeks   PT Treatment/Interventions ADLs/Self Care Home Management;Functional mobility training;DME Instruction;Neuromuscular re-education;Balance training;Therapeutic exercise;Therapeutic activities;Patient/family education;Orthotic Fit/Training;Energy conservation;Manual techniques;Wheelchair mobility training;Passive range of motion   PT Next Visit Plan  Consider standing frame for spasticity management.with transfers, improve positioning, prevent contractures. Initial home stretching/strengthening program.   PT Home Exercise Plan Asked pt to bring family member to first session to learn how to assist with home stretching program for spasticity management.   Recommended Other Services Refer patient to orthotist to ensure L ankle stability with transfers. Consider referral for w/c evaluation to increase pt independence    Consulted and Agree with Plan of Care Patient         Problem List There are no active problems to display for this patient.   Jorje Guild, PT, DPT [facility] [address] [place], [place], [address] Phone: [phone]   Fax:  [phone] 02/24/2015, 6:01 PM

## 2015-03-10 ENCOUNTER — Ambulatory Visit: Payer: Medicaid Other | Admitting: Neurology

## 2015-03-16 ENCOUNTER — Ambulatory Visit: Payer: Medicaid Other | Admitting: Physical Therapy

## 2015-03-23 ENCOUNTER — Telehealth: Payer: Self-pay | Admitting: Physical Therapy

## 2015-03-23 NOTE — Telephone Encounter (Signed)
[name],  Lindsay Schneider was present for a PT evaluation on 02/24/15. This patient would greatly benefit from a custom wheelchair, as she has been non-ambulatory for years and currently utilizes a transport chair for all mobility. A custom wheelchair would improve positioning/alignment, prevent contractures, and decrease the risk of future complications.  You have an appointment with Lindsay Schneider on 9/14. If you agree with my recommendation after seeing this patient, please place an order for a wheelchair evaluation.  Thank you, Jorje Guild, PT, DPT [facility] [address] [place], [place], [address] Phone: [phone]   Fax:  [phone] 03/23/2015, 9:02 AM

## 2015-03-30 ENCOUNTER — Ambulatory Visit: Payer: Medicaid Other | Attending: Internal Medicine | Admitting: Physical Therapy

## 2015-04-07 ENCOUNTER — Ambulatory Visit: Payer: Medicaid Other | Admitting: Neurology

## 2015-04-13 ENCOUNTER — Ambulatory Visit: Payer: Medicaid Other | Admitting: Physical Therapy

## 2015-04-21 NOTE — Telephone Encounter (Signed)
Patient did not show up for f/u visit

## 2015-04-27 ENCOUNTER — Encounter: Payer: Self-pay | Admitting: Neurology

## 2015-04-27 ENCOUNTER — Ambulatory Visit (INDEPENDENT_AMBULATORY_CARE_PROVIDER_SITE_OTHER): Payer: Medicaid Other | Admitting: Neurology

## 2015-04-27 VITALS — BP 124/78 | HR 68 | Resp 16 | Ht 60.0 in | Wt 143.0 lb

## 2015-04-27 DIAGNOSIS — R5383 Other fatigue: Secondary | ICD-10-CM | POA: Insufficient documentation

## 2015-04-27 DIAGNOSIS — N399 Disorder of urinary system, unspecified: Secondary | ICD-10-CM

## 2015-04-27 DIAGNOSIS — G35 Multiple sclerosis: Secondary | ICD-10-CM | POA: Insufficient documentation

## 2015-04-27 DIAGNOSIS — R29898 Other symptoms and signs involving the musculoskeletal system: Secondary | ICD-10-CM | POA: Insufficient documentation

## 2015-04-27 DIAGNOSIS — R269 Unspecified abnormalities of gait and mobility: Secondary | ICD-10-CM | POA: Diagnosis not present

## 2015-04-27 DIAGNOSIS — M6289 Other specified disorders of muscle: Secondary | ICD-10-CM

## 2015-04-27 HISTORY — DX: Other symptoms and signs involving the musculoskeletal system: R29.898

## 2015-04-27 MED ORDER — TIZANIDINE HCL 4 MG PO TABS: 4.0000 mg | ORAL_TABLET | Freq: Four times a day (QID) | ORAL | Status: DC | PRN

## 2015-04-27 MED ORDER — OXYBUTYNIN CHLORIDE 5 MG PO TABS: 5.0000 mg | ORAL_TABLET | Freq: Three times a day (TID) | ORAL | Status: DC

## 2015-04-27 NOTE — Progress Notes (Signed)
[facility]  PATIENT: Lindsay Schneider DOB: April 04, 1986  REFERRING DOCTOR OR PCP:   SOURCE:   _________________________________   HISTORICAL  CHIEF COMPLAINT:  Chief Complaint  Patient presents with  . Multiple Sclerosis    Lindsay Schneider is here with her cousin Barbara Cower, for eval of MS. Sts. dx. in 2011.  Presenting sx. was right arm/leg numbness/weakness.  Sts. dx. was confirmed with MRI.  No LP.  Sts. she initially saw Dr. Terrace Arabia here at [facility], and was placed on Rebif.  Sts. she stopped Rebif b/c legs felt weaker while she was on it.  Sts. she transferred care to Dr. Renne Crigler at [facility], and was started on Tysabri.  Sts. she had difficulty getting to infusion appt's, so she stopped Tysabr and started Rituxan.  Sts. she had progression of   . Extremity Weakness    weakness on Rituxan, so a couple of mos. ago she went back on Tysabri.  Sts. JCV ab tests have been negative in the past--she is not sure when JCV was last checked.  Sts. she is unsure when her last MRI was done, but it would have been done at [facility].  Sts. she lives in [place], so would like to switch care to [name].  Sts. her last Tysabri infusion was 04-02-15.  Sts. she still has a difficult time with transportation--she has Medicaid, so would have to be infused at [place]  . Gait Disturbance    [facility].  She is in a w/c today, has much difficulty even standing--must have help--was not able to stand for a weight./fim    HISTORY OF PRESENT ILLNESS:  I had the pleasure of seeing your patient, Lindsay Schneider for a neurologic consultation regarding her MS.   She is a 29 year old woman who was diagnosed with MS in 2011 after presenting with left sided arm and leg numbness and weakness. Her symptoms develop over 1 day. She had an MRI performed with lesions consistent with MS. She started to see Dr. Terrace Arabia here at [facility]. She was placed on Rebif. However, she stopped because her legs felt weaker when she  was on it. She then transferred her care to Dr. Renne Crigler at [facility].     She was started on Tysabri.  She had several infusions of Tysabri but transportation was difficult to get to [facility]. Therefore, Dr. Renne Crigler switched her to Rituxan although she tolerated the medication well. She had a significant relapse with bilateral leg weakness within a couple weeks of the infusion. Therefore, 6 months later, she started Tysabri again. She has had difficulty getting to [facility] and would like to be infused locally. Therefore, she is shifting her care to our practice.   Her last infusion was 04/02/2015  Her main problem is poor gait and poor use of the left arm.    She has difficulty getting around the house to do her activities of daily living such as getting to the bathroom, kitchen or other rooms of the house. She often needs assistance with meal preparation and other chores around the house.  Before her last exacerbation, she was able to get around with a walker. However, since last year, when she went on Rituxan, she has needed to use a wheelchair.   She cannot walk with a cane or walker.    She is currently using a light weight wheelchair to get around the house. She cannot self propel with her arms so she sometimes scoots the  chair backwards with her feet.   This is not optimal for her to do her activities of daily living.  Because of the weakness in the left arm, she cannot self propel the light weight wheelchair properly. She needs a power operated vehicle. A scooter will not help her perform her activities of daily living as maneuvering around the house would be difficult due to the turning radius. Additionally, the tiller will get in the way for her to do many of the activities of daily living. She spends much of her time in the wheelchair and getting in and out of a scooter would be difficult.  Therefore, she needs an Mining engineer wheelchair. Hand controls would need to be on the right as she has difficulty  controlling the left hand. She has the mental abilities to operate this type of device and she is motivated to do so.    She will spend most of the day in the electric wheelchair.  She is able to transfer from a wheelchair to toilet but needs help transferring to and from a couch.   She has other symptoms related to her MS.     Besides the weakness in the left arm and leg, she also has clumsiness in these limbs. She has a lot of spasticity in both legs and the left arm. The legs sometoimes have spasms that take a minute to break. Often, she gets spasms when she tries to straighten out She does not note much difficulty with numbness or painful tingling.  She has difficulty with her bladder. She has urinary urgency and frequency and will have occasional incontinence if she cannot get to the bathroom and transfer in time.  She has never been tried on a bladder medication to her knowledge.  She denies any difficulty with her vision.  She notes a lot of difficulty with fatigue. This is much worse when she tires herself out moving the wheelchair or she is hot.    She has difficulty with sleep because she has problems turning over in bed. Her legs will sometimes go numb when she is sleeping.  She notes depression but no anxiety. She cries at times and gets frustrated due to her medical issues and disability. Zoloft was tried but she did not get a benefit.   She denies any major difficulties with cognitive function. She sometimes has difficulty focusing.   REVIEW OF SYSTEMS: Constitutional: No fevers, chills, sweats, or change in appetite.   She has fatigue and poor sleep.    Eyes: No visual changes, double vision, eye pain Ear, nose and throat: No hearing loss, ear pain, nasal congestion, sore throat Cardiovascular: No chest pain, palpitations Respiratory: No shortness of breath at rest or with exertion.   No wheezes GastrointestinaI: No nausea, vomiting, diarrhea, abdominal pain, fecal  incontinence Genitourinary: Shehas urinary urgency and frequency with occ incontinence.    Hasnocturia. Musculoskeletal: No neck pain, back pain Integumentary: No rash, pruritus, skin lesions Neurological: as above Psychiatric:   Some depression at this time.  No anxiety Endocrine: No palpitations, diaphoresis, change in appetite, change in weigh or increased thirst Hematologic/Lymphatic: No anemia, purpura, petechiae. Allergic/Immunologic: No itchy/runny eyes, nasal congestion, recent allergic reactions, rashes  ALLERGIES: No Known Allergies  HOME MEDICATIONS:  Current outpatient prescriptions:  .  baclofen (LIORESAL) 5 mg TABS, Take 10 mg by mouth 3 (three) times daily., Disp: , Rfl:  .  Dalfampridine (AMPYRA PO), Take 10 mg by mouth. , Disp: , Rfl:  .  medroxyPROGESTERone (  DEPO-PROVERA) 150 MG/ML injection, Inject 150 mg into the muscle every 3 (three) months., Disp: , Rfl:   PAST MEDICAL HISTORY: Past Medical History  Diagnosis Date  . MS (multiple sclerosis) (HCC)   . Movement disorder     PAST SURGICAL HISTORY: Past Surgical History  Procedure Laterality Date  . Cesarean section      FAMILY HISTORY: Family History  Problem Relation Age of Onset  . Asthma Mother   . Hypertension Father   . Healthy Sister   . Healthy Brother     SOCIAL HISTORY:  Social History   Social History  . Marital Status: Single    Spouse Name: N/A  . Number of Children: N/A  . Years of Education: N/A   Occupational History  . Not on file.   Social History Main Topics  . Smoking status: Never Smoker   . Smokeless tobacco: Not on file  . Alcohol Use: No  . Drug Use: No  . Sexual Activity: Not Currently    Birth Control/ Protection: Injection   Other Topics Concern  . Not on file   Social History Narrative     PHYSICAL EXAM  Filed Vitals:   04/27/15 1020  BP: 124/78  Pulse: 68  Resp: 16  Height: 5' (1.524 m)  Weight: 143 lb (64.864 kg)    Body mass index is  27.93 kg/(m^2).   General: The patient is well-developed and well-nourished and in no acute distress  Eyes:  Funduscopic exam shows normal optic discs and retinal vessels.  Neck: The neck is supple, no carotid bruits are noted.  The neck is nontender.  Cardiovascular: The heart has a regular rate and rhythm with a normal S1 and S2. There were no murmurs, gallops or rubs. Lungs are clear to auscultation.  Skin: Extremities are without significant edema.  Musculoskeletal:  Back is nontender  Neurologic Exam  Mental status: The patient is alert and oriented x 3 at the time of the examination. The patient has apparent normal recent and remote memory, with an apparently normal attention span and concentration ability.   Speech is normal.  Cranial nerves: Extraocular movements are full. Pupils are equal, round, and reactive to light and accomodation.  Visual fields are full.  Facial symmetry is present. There is good facial sensation to soft touch bilaterally.Facial strength is normal.  Trapezius and sternocleidomastoid strength is normal. No dysarthria is noted.  The tongue is midline, and the patient has symmetric elevation of the soft palate. No obvious hearing deficits are noted.  Motor:  Muscle bulk is inormal and tone is greatly ncreased in her legs (L>R) and the left arm.   Right arm with good tone/strength.   Strength (R/L) is  2 / 2- in hip flexors, 4-/3 knee extensors, 2+/1 ankle extensors, 2+ to 3/5 proximal and 3 to 4-/5 distal muscles of left arm.   Leg spasticity 'locks up' with leg strength testing. .   Sensory: Sensory testing is intact to pinprick, soft touch and vibration sensation in all 4 extremities except left foot with reduced vibration  Coordination: Cerebellar testing reveals poor left finger-nose-finger and she can't do heel-to-shin bilaterally.  Gait and station: She cannot stand without maximal support.   She cannot walk even with assistance.      Reflexes: Deep  tendon reflexes are increased in all limbs, worse in legs (clonus at ankles, spread at knees) and 3+ in left arm.   .   Plantar responses are extensor.  DIAGNOSTIC DATA (LABS, IMAGING, TESTING) - I reviewed patient records, labs, notes, testing and imaging myself where available.  Lab Results  Component Value Date   WBC 8.1 03/05/2012   HGB 12.9 03/05/2012   HCT 37.1 03/05/2012   MCV 83.2 03/05/2012   PLT 220 03/05/2012      Component Value Date/Time   NA 138 03/05/2012 1805   K 3.6 03/05/2012 1805   CL 105 03/05/2012 1805   CO2 21 03/05/2012 1805   GLUCOSE 96 03/05/2012 1805   BUN 7 03/05/2012 1805   CREATININE 0.58 03/05/2012 1805   CALCIUM 9.6 03/05/2012 1805   PROT 7.6 03/05/2012 1805   ALBUMIN 4.7 03/05/2012 1805   AST 14 03/05/2012 1805   ALT 9 03/05/2012 1805   ALKPHOS 76 03/05/2012 1805   BILITOT 0.7 03/05/2012 1805   GFRNONAA >90 03/05/2012 1805   GFRAA >90 03/05/2012 1805       ASSESSMENT AND PLAN  Multiple sclerosis (HCC) - Plan: MR Brain W Wo Contrast, MR Cervical Spine W Wo Contrast  Gait disturbance - Plan: MR Brain W Wo Contrast, MR Cervical Spine W Wo Contrast  Urinary disorder  Other fatigue  Left hand weakness  Bilateral leg weakness     1.    Robbie needs an Mining engineer wheelchair to help with her activities of daily living.    He is too weak to use a cane or walker. She cannot self propel a regular or light weight wheelchair. A scooter will not help her enough for her activities of daily living as she will have trouble navigating around the house due to the large turning radius. Additionally, the tiller will get in the way of her transfers and performance of ADLs. Therefore she needs an Mining engineer wheelchair. Hand controls must be on the right as she has clumsiness and weakness in the left hand. 2.    She will continue Tysabri for the multiple sclerosis. We will try to get her in at [facility] so that her travel time is much less.   She has  had a JCV antibody test in the last few months and was told it was negative. We will try to get those results and check her again around 6 months.   We will check MRi of brain and cervical spine to assess whether another medication might be more useful (if a lot of breakthrough is seen) 3.   Spasticity is a major problem for her. She is currently on baclofen 90 mg a day.   She has not tried tizanidine anfd I'll add that. 4.    Bladder is also a problem for her and she has not been on anticholinergics in the past.   Trial of oxybutynin 5.   Return to see me in 4 months or sooner if there are new or worsening neurologic symptoms.  Richard A. Epimenio Foot, MD, PhD 04/27/2015, 10:26 AM Certified in Neurology, Clinical Neurophysiology, Sleep Medicine, Pain Medicine and Neuroimaging  [facility] [address], [address] [place], [place] [address] [phone]

## 2015-04-27 NOTE — Patient Instructions (Signed)
Start tizanidine for muscle spasms. Initially, take one half pill twice a day. Then slowly over a few weeks as this one half pill at a time until you're taking 1 pill 3 times a day.   Take oxybutynin twice a day for her bladder.  We will try to get you set up for your next infusion at [facility]

## 2015-04-28 ENCOUNTER — Encounter: Payer: Self-pay | Admitting: *Deleted

## 2015-04-28 ENCOUNTER — Telehealth: Payer: Self-pay | Admitting: Neurology

## 2015-05-03 ENCOUNTER — Encounter (HOSPITAL_COMMUNITY)
Admission: RE | Admit: 2015-05-03 | Discharge: 2015-05-03 | Disposition: A | Discharge status: Home / Self Care | Payer: Medicaid Other | Source: Ambulatory Visit | Attending: Neurology | Admitting: Neurology

## 2015-05-03 ENCOUNTER — Encounter (HOSPITAL_COMMUNITY): Payer: Self-pay

## 2015-05-03 ENCOUNTER — Other Ambulatory Visit (HOSPITAL_COMMUNITY): Payer: Self-pay | Admitting: Neurology

## 2015-05-03 DIAGNOSIS — G35 Multiple sclerosis: Secondary | ICD-10-CM | POA: Insufficient documentation

## 2015-05-03 MED ORDER — SODIUM CHLORIDE 0.9 % IV SOLN
300.0000 mg | INTRAVENOUS | Status: DC
  Administered 2015-05-03: 300 mg via INTRAVENOUS
  Filled 2015-05-03: qty 15

## 2015-05-03 MED ORDER — SODIUM CHLORIDE 0.9 % IV SOLN
INTRAVENOUS | Status: DC
  Administered 2015-05-03: 250 mL via INTRAVENOUS

## 2015-05-03 NOTE — Progress Notes (Signed)
Pt here today for her first infusion of TYSABRI at Short Stay. She states she had previously been receiving it at [facility] without problems. Uneventful infusion and 1 hour post infusion observation. Pt was discharged via her w/c accompanied by family

## 2015-05-06 ENCOUNTER — Ambulatory Visit: Payer: Medicaid Other | Attending: Internal Medicine | Admitting: Physical Therapy

## 2015-05-06 ENCOUNTER — Ambulatory Visit: Payer: Medicaid Other | Admitting: Physical Therapy

## 2015-05-20 ENCOUNTER — Other Ambulatory Visit: Payer: Medicaid Other

## 2015-05-20 ENCOUNTER — Inpatient Hospital Stay: Admission: RE | Admit: 2015-05-20 | Payer: Medicaid Other | Source: Ambulatory Visit

## 2015-05-28 ENCOUNTER — Encounter: Payer: Self-pay | Admitting: Physical Therapy

## 2015-05-28 NOTE — Therapy (Signed)
[facility] [address] [place], [place], [address] Phone: [phone]   Fax:  [phone]  Patient Details  Name: Lindsay Schneider MRN: [id number] Date of Birth: [birth year] Referring Provider:  No ref. provider found  Encounter Date: 05/28/2015   PHYSICAL THERAPY DISCHARGE SUMMARY  Visits from Start of Care: 1 (evaluation only)  Current functional level related to goals / functional outcomes:  Unknown, as patient did not return to therapy due to financial limitations.     PT Long Term Goals - 02/24/15 1737    PT LONG TERM GOAL #1   Title Patient will perform squat pivot transfer from wheelchair <> mat table with mod A of single person to increase pt independence with funtional transfers and decrease physical burden on caregivers.  Target date: 04/14/15.   Baseline Patient currently requires Total assist of two persons for all functional tranfers.   Status New   PT LONG TERM GOAL #2   Title Pt will perform supine to/from sit with min A and 25% cueing to indicate increased independence with bed mobility.  Target date: 04/14/15.   Baseline Pt currently requirea Max to Total A and 75% cueing with bed mobility.   Status New   PT LONG TERM GOAL #3   Title Patient/caregivers will verbalize understanding of available equipment options and resources to maximize pt's current function as well as anticipated functional status.  Target date: 04/14/15.   Baseline Patient/caregivers currently dependent for information on available DME and resources appropriate for patient.   PT LONG TERM GOAL #4   Title Make referral for wheelchair evaluation for optimal positioning, spasticity management, to maximize pt independence with functional mobility, and to prevent future complications such as contractures and skin breakdown. Target date: 04/14/15.   Baseline Pt currently utilizes transport chair for seating, positioning, and as primary means of  mobility. Transport chair does not maximize pt independence with mobility and places patient is poor alignment/positioning, which places pt at risk of complications described above.   Status New       Remaining deficits: Unknown, as patient did not return to PT after initial PT session.   Education / Equipment: Recommending evaluation for power wheelchair; see rationale above (Long term goal #4). Plan: Patient agrees to discharge.  Patient goals were not met. Patient is being discharged due to financial reasons.  ?????       Billie Ruddy, PT, DPT [facility] [address] [place], [place], [address] Phone: [phone]   Fax:  [phone] 05/28/2015, 1:45 PM

## 2015-05-31 ENCOUNTER — Encounter (HOSPITAL_COMMUNITY)
Admission: RE | Admit: 2015-05-31 | Discharge: 2015-05-31 | Disposition: A | Discharge status: Home / Self Care | Payer: Medicaid Other | Source: Ambulatory Visit | Attending: Neurology | Admitting: Neurology

## 2015-05-31 ENCOUNTER — Encounter (HOSPITAL_COMMUNITY): Payer: Self-pay

## 2015-05-31 DIAGNOSIS — G35 Multiple sclerosis: Secondary | ICD-10-CM | POA: Diagnosis present

## 2015-05-31 MED ORDER — NATALIZUMAB 300 MG/15ML IV CONC
300.0000 mg | INTRAVENOUS | Status: DC
  Administered 2015-05-31: 300 mg via INTRAVENOUS
  Filled 2015-05-31: qty 15

## 2015-05-31 MED ORDER — SODIUM CHLORIDE 0.9 % IV SOLN
INTRAVENOUS | Status: DC
  Administered 2015-05-31: 10:00:00 via INTRAVENOUS

## 2015-05-31 NOTE — Progress Notes (Signed)
Asst up to BR to Void

## 2015-05-31 NOTE — Progress Notes (Signed)
Tolerated tysabri infusion and stayed the 1 hour observation. But became teary when saying that she was getting weaker and needing more assistance to Toilet

## 2015-06-03 ENCOUNTER — Other Ambulatory Visit: Payer: Medicaid Other

## 2015-06-03 ENCOUNTER — Inpatient Hospital Stay: Admission: RE | Admit: 2015-06-03 | Payer: Medicaid Other | Source: Ambulatory Visit

## 2015-06-10 ENCOUNTER — Ambulatory Visit: Payer: Medicaid Other | Attending: Internal Medicine | Admitting: Physical Therapy

## 2015-06-10 DIAGNOSIS — R258 Other abnormal involuntary movements: Secondary | ICD-10-CM | POA: Insufficient documentation

## 2015-06-10 DIAGNOSIS — R29898 Other symptoms and signs involving the musculoskeletal system: Secondary | ICD-10-CM | POA: Diagnosis present

## 2015-06-10 DIAGNOSIS — R252 Cramp and spasm: Secondary | ICD-10-CM

## 2015-06-11 DIAGNOSIS — R29898 Other symptoms and signs involving the musculoskeletal system: Secondary | ICD-10-CM | POA: Diagnosis not present

## 2015-06-11 NOTE — Therapy (Signed)
[facility] [address] [place], [place], [address] Phone: [phone]   Fax:  [phone]  Physical Therapy Evaluation  Patient Details  Name: REMEE Schneider MRN: [id number] Date of Birth: 1986-04-20 No Data Recorded  Encounter Date: 06/10/2015      PT End of Session - 06/11/15 1259    Visit Number 1  wheelchair eval   Number of Visits 1   Authorization Type Medicaid   PT Start Time 3235   PT Stop Time 0947   PT Time Calculation (min) 60 min      Past Medical History  Diagnosis Date  . MS (multiple sclerosis) ([place])   . Movement disorder     Past Surgical History  Procedure Laterality Date  . Cesarean section      There were no vitals filed for this visit.  Visit Diagnosis:  Weakness of both legs  Spasticity       Mobility/Seating Evaluation    PATIENT INFORMATION: Name: Lindsay Schneider DOB: 1985-08-17  Sex: Female Date seen: 06/10/15 Time: 0845  Address:  [address] [place], [place]  [address] Physician: Dr. Arlice Colt This evaluation/justification form will serve as the LMN for the following suppliers: __________________________ Supplier: [facility] Contact Person: Yvone Neu Phone:  [phone]   Seating Therapist: Mady Haagensen, PT Phone:   [phone]   Phone: [phone]    Spouse/Parent/Caregiver name: Sherrye Payor  Phone number: [phone] Insurance/Payer: Medicaid     Reason for Referral: Wheelchair evaluation  Patient/Caregiver Goals: Power wheelchair for independent mobility  Patient was seen for face-to-face evaluation for new power wheelchair.  Also present was Philomena Course, cousin to discuss recommendations and wheelchair options.  Further paperwork was completed and sent to vendor.  Patient appears to qualify for power mobility device at this time per objective findings.   MEDICAL HISTORY: Diagnosis: Primary Diagnosis: Multiple sclerosis  (MS)-relapsing/remitting (G 35) Onset: 2011 Diagnosis: urinary disorder (N39.9), L hand weakness (M62.89), bilateral leg weakness (R29.898)   '[x]' Progressive Disease Relevant past and future surgeries: ?????   Height: 5'2" Weight: 143 lbs Explain recent changes or trends in weight: Pt reports >20 lb weight gain since dx in 2011   History including Falls: Pt is a 29 year old female with diagnosis of MS, with significant L leg weakness.  She is not able to walk due to significant leg weakness.  She has not had any falls in the past 6 months.    HOME ENVIRONMENT: '[x]' House  '[]' Condo/town home  '[]' Apartment  '[]' Assisted Living    '[]' Lives Alone '[x]'  Lives with Others                                                                                          Hours with caregiver: ?????  '[x]' Home is accessible to patient           Stairs      '[]' Yes '[]'  No     Ramp '[x]' Yes '[]' No Comments:  Pt lives in one level home and is wheelchair accessible.   COMMUNITY ADL: TRANSPORTATION: '[]' Car    '[]' Van    '[x]' Public  Transportation    '[]' Adapted w/c Lift    '[]' Ambulance    '[]' Other:       '[x]' Sits in wheelchair during transport  Employment/School: N/A Specific requirements pertaining to mobility ?????  Other: ?????    FUNCTIONAL/SENSORY PROCESSING SKILLS:  Handedness:   '[x]' Right     '[]' Left    '[]' NA  Comments:  ?????  Functional Processing Skills for Wheeled Mobility '[x]' Processing Skills are adequate for safe wheelchair operation  Areas of concern than may interfere with safe operation of wheelchair Description of problem   '[]'  Attention to environment      '[]' Judgment      '[]'  Hearing  '[]'  Vision or visual processing      '[]' Motor Planning  '[]'  Fluctuations in Behavior  ?????    VERBAL COMMUNICATION: '[x]' WFL receptive '[x]'  WFL expressive '[]' Understandable  '[]' Difficult to understand  '[]' non-communicative '[]'  Uses an augmented communication device  CURRENT SEATING / MOBILITY: Current Mobility Base:  '[]' None '[]' Dependent '[x]' Manual  '[]' Scooter '[]' Power  Type of Control: ?????  Manufacturer:  [employer] ChairSize:  19 x 16Age: 3 year  Current Condition of Mobility Base:  fair   Current Wheelchair components:  transport chair  Describe posture in present seating system:  Pt leans to left and has some posterior pelvic tilt.  She keeps left leg propped on footrest of chair.  (At end of wheelchair, pt reports having a power wheelchair at home that she never used, >39 years old, unable to be charged and used)      SENSATION and SKIN ISSUES: Sensation '[x]' Intact  '[]' Impaired '[]' Absent  Level of sensation: ????? Pressure Relief: Able to perform effective pressure relief :    '[]' Yes  '[x]'  No Method: quick scoot If not, Why?: Pt not able to effectively off load pressure from bottom >1-2 seconds due to L sided weakness  Skin Issues/Skin Integrity Current Skin Issues  '[]' Yes '[x]' No '[]' Intact '[]'  Red area'[]'  Open Area  '[]' Scar Tissue '[]' At risk from prolonged sitting Where  ?????  History of Skin Issues  '[]' Yes '[x]' No Where  ????? When  ?????  Hx of skin flap surgeries  '[]' Yes '[x]' No Where  ????? When  ?????  Limited sitting tolerance '[]' Yes '[x]' No Hours spent sitting in wheelchair daily: most of waking hours  Complaint of Pain:  Please describe: Pt has no c/o pain, only stiffness in legs.  Pt c/o pain at times during sleep, in L hip.  Pt rates pain as 4/10.  Repositioning helps alleviate pain.   Swelling/Edema: Occasional swelling in legs and ankles   ADL STATUS (in reference to wheelchair use):  Indep Assist Unable Indep with Equip Not assessed Comments  Dressing ????? ????? ????? X ????? ?????  Eating X ????? ????? ????? ????? ?????  Toileting ????? X ????? ????? ????? ?????  Bathing ????? X ????? ????? ????? Pt uses tub bench-needs assist for transfer  Grooming/Hygiene ????? ????? ????? ????? ????? ?????  Meal Prep ????? X ????? ????? ????? ?????  IADLS ????? ????? ????? ????? X ?????  Bowel Management: '[x]' Continent   '[]' Incontinent  '[]' Accidents Comments:  ?????  Bladder Management: '[]' Continent  '[]' Incontinent  '[x]' Accidents Comments:  has urgency issues     WHEELCHAIR SKILLS: Manual w/c Propulsion: '[]' UE or LE strength and endurance sufficient to participate in ADLs using manual wheelchair Arm : '[]' left '[]' right   '[]' Both      Distance: ????? Foot:  '[]' left '[]' right   '[]' Both  Operate Scooter: '[]'  Strength, hand grip, balance and transfer appropriate for use '[]' Living environment  is accessible for use of scooter  Operate Power w/c:  '[x]'  Std. Joystick   '[]'  Alternative Controls Indep '[x]'  Assist '[]'  Dependent/unable '[]'  N/A '[]'   '[x]' Safe          '[x]'  Functional      Distance: ?????  Bed confined without wheelchair '[x]'  Yes '[]'  No   STRENGTH/RANGE OF MOTION:  Passive Range of Motion Strength  Shoulder limited in abduction and flexion on LUE due to increased tone Difficult to assess LUE strength due to tone; R shoulder at least 3/5 flexion and extension  Elbow able to achieve elbow extension with increased time for P/ROM due to increased tone. Difficult to assess LUE strength due to tone; R elbow at least 3/5 flexion and extension  Wrist/Hand Limited in full wrist ROM due to increased tone Difficult to assess due to UE tone  Hip P/ROM [facility] Right/Left Hip  Right;Left    Right Hip Flexion  2-/5    Right Hip Extension  2-/5    Left Hip Flexion  1/5    Left Hip Extension  2-/5    Right/Left Knee  Right;Left    Right Knee Flexion  3-/5      Knee P/ROM WFL in sitting in wheelchair; able to fully extend with slow, increased time for ROM Right Knee Extension  3-/5    Left Knee Flexion  1/5    Left Knee Extension  2/5      Ankle -15 degrees from neutral ( pt holds in internal rotation and plantar flexion); pt has spasms in LLE and is on Baclofen.  Right/Left Ankle  Right;Left    Right Ankle Dorsiflexion  3-/5    Right Ankle Plantar Flexion  3+/5    Left Ankle Dorsiflexion  0/5    Left Ankle Plantar Flexion  3-/5    Overall Strength  Deficits;Other (comment)     Overall Strength Comments  Difficult to formally assess strength secondary to spasticity (most severe in LLE) and decreased motor control in BLE's.    Strength Assessment Site  Hip;Knee;Ankle   (The above detailed strength measurements were taken at PT eval in August 2016)      MOBILITY/BALANCE:  '[]'  Patient is totally dependent for mobility  ?????    Balance Transfers Ambulation  Sitting Balance: Standing Balance: '[]'  Independent '[]'  Independent/Modified Independent  '[]'  WFL     '[]'  WFL '[]'  Supervision '[]'  Supervision  '[x]'  Uses UE for balance  '[]'  Supervision '[]'  Min Assist '[]'  Ambulates with Assist  ?????    '[]'  Min Assist '[]'  Min assist '[]'  Mod Assist '[]'  Ambulates with Device:      '[]'  RW  '[]'  StW  '[]'  Cane  '[]'  ?????  '[]'  Mod Assist '[]'  Mod assist '[x]'  Max assist   '[]'  Max Assist '[]'  Max assist '[]'  Dependent '[]'  Indep. Short Distance Only  '[]'  Unable '[x]'  Unable '[]'  Lift / Sling Required Distance (in feet)  ?????   '[]'  Sliding board '[x]'  Unable to Ambulate (see explanation below)  Cardio Status:  '[x]' Intact  '[]'  Impaired   '[]'  NA     ?????  Respiratory Status:  '[x]' Intact   '[]' Impaired   '[]' NA     ?????  Orthotics/Prosthetics: NA  Comments (Address manual vs power w/c vs scooter): Pt is non-ambulatory due to increase in tone in LLE, with L knee completely locking out upon attempts to stand.  Pt requires max assistance for squat pivot transfers.  Pt is noting decreased strength, increased stiffness in RLE as  well.         Anterior / Posterior Obliquity Rotation-Pelvis tends to have increased lean/weightshift to L side  PELVIS    '[x]'  '[]'  '[]'   Neutral Posterior Anterior  '[x]'  '[]'  '[]'   WFL Rt elev Lt elev  '[x]'  '[]'  '[]'   WFL Right Left                      Anterior    Anterior     '[]'  Fixed '[]'  Other '[x]'  Partly Flexible '[]'  Flexible   '[]'  Fixed '[]'  Other '[x]'  Partly Flexible  '[]'  Flexible  '[]'  Fixed '[]'  Other '[x]'  Partly Flexible  '[]'  Flexible   TRUNK  '[x]'  '[]'  '[]'   WFL ?  Thoracic ? Lumbar  Kyphosis Lordosis  '[]'  '[]'  '[]'   WFL Convex Convex  Right Left '[]' c-curve '[]' s-curve '[]' multiple  '[x]'  Neutral '[]'  Left-anterior '[]'  Right-anterior     '[]'  Fixed '[]'  Flexible '[]'  Partly Flexible '[]'  Other  '[]'  Fixed '[]'  Flexible '[x]'  Partly Flexible '[]'  Other  '[]'  Fixed             '[]'  Flexible '[x]'  Partly Flexible '[]'  Other    Position Windswept  slightly abducted at L hip  HIPS          '[]'            '[x]'               '[]'    Neutral       Abduct        ADduct         '[]'           '[]'            '[]'   Neutral Right           Left      '[]'  Fixed '[]'  Subluxed '[x]'  Partly Flexible '[]'  Dislocated '[]'  Flexible  '[]'  Fixed '[]'  Other '[]'  Partly Flexible  '[]'  Flexible                 Foot Positioning Knee Positioning  R ankle lacks -15 degrees dorsiflexion and is held in plantarflexion and inversion    '[]'  WFL  '[]' Lt '[x]' Rt '[x]'  WFL  '[]' Lt '[]' Rt    KNEES ROM concerns: ROM concerns:    & Dorsi-Flexed '[]' Lt '[]' Rt ?????    FEET Plantar Flexed '[]' Lt '[x]' Rt      Inversion                 '[]' Lt '[x]' Rt      Eversion                 '[]' Lt '[]' Rt     HEAD '[x]'  Functional '[x]'  Good Head Control  ?????  & '[]'  Flexed         '[]'  Extended '[]'  Adequate Head Control    NECK '[]'  Rotated  Lt  '[]'  Lat Flexed Lt '[]'  Rotated  Rt '[]'  Lat Flexed Rt '[]'  Limited Head Control     '[]'  Cervical Hyperextension '[]'  Absent  Head Control     SHOULDERS ELBOWS WRIST& HAND increased tone in L UE      Left     Right    Left     Right    Left     Right   U/E '[x]' Functional           '[x]' Functional increased tone in L UE;passively takes increased time for full elbow extension WFL '[]' Fisting             [  x]Fisting      '[]' elev   '[x]' dep      '[]' elev   '[]' dep       '[x]' pro -'[]' retract     '[]' pro  '[]' retract '[]' subluxed             '[]' subluxed           Goals for Wheelchair Mobility  '[x]'  Independence with mobility in the home with motor related ADLs (MRADLs)  '[]'  Independence with MRADLs in the community '[]'  Provide dependent mobility  '[x]'  Provide recline      '[x]' Provide tilt   Goals for Seating system '[x]'  Optimize pressure distribution '[x]'  Provide support needed to facilitate function or safety '[x]'  Provide corrective forces to assist with maintaining or improving posture '[x]'  Accommodate client's posture:   current seated postures and positions are not flexible or will not tolerate corrective forces '[x]'  Client to be independent with relieving pressure in the wheelchair '[]' Enhance physiological function such as breathing, swallowing, digestion  Simulation ideas/Equipment trials:????? State why other equipment was unsuccessful:Pt unable to use manual wheelchair due to increased tone, decreased active movement and ROM, decreased strength on L side   MOBILITY BASE RECOMMENDATIONS and JUSTIFICATION: MOBILITY COMPONENT JUSTIFICATION  Manufacturer: PermobilModel: M300   Size: Width 18"Seat Depth 19" '[x]' provide transport from point A to B      '[x]' promote Indep mobility  '[x]' is not a safe, functional ambulator '[x]' walker or cane inadequate '[]' non-standard width/depth necessary to accommodate anatomical measurement '[]'  ?????  '[]' Manual Mobility Base '[]' non-functional ambulator    '[]' Scooter/POV  '[]' can safely operate  '[]' can safely transfer   '[]' has adequate trunk stability  '[]' cannot functionally propel manual w/c  '[x]' Power Mobility Base  '[x]' non-ambulatory  '[x]' cannot functionally propel manual wheelchair  '[x]'  cannot functionally and safely operate scooter/POV '[x]' can safely operate and willing to  '[]' Stroller Base '[]' infant/child  '[]' unable to propel manual wheelchair '[]' allows for growth '[]' non-functional ambulator '[]' non-functional UE '[]' Indep mobility is not a goal at this time  '[x]' Tilt  '[]' Forward '[x]' Backward '[x]' Powered tilt  '[]' Manual tilt  '[x]' change position against gravitational force on head and shoulders  '[x]' change position for pressure relief/cannot weight shift '[x]' transfers  '[x]' management of tone '[x]' rest periods '[x]' control edema '[x]' facilitate postural  control  '[]'  ?????  '[x]' Recline  '[x]' Power recline on power base '[]' Manual recline on manual base  '[x]' accommodate femur to back angle  '[x]' bring to full recline for ADL care  '[x]' change position for pressure relief/cannot weight shift '[x]' rest periods '[x]' repositioning for transfers or clothing/diaper /catheter changes '[x]' head positioning  '[]' Lighter weight required '[]' self- propulsion  '[]' lifting '[]'  ?????  '[]' Heavy Duty required '[]' user weight greater than 250# '[]' extreme tone/ over active movement '[]' broken frame on previous chair '[]'  ?????  '[x]'  Back  '[]'  Angle Adjustable '[]'  Custom molded Corpus 3G Ergo '[x]' postural control '[x]' control of tone/spasticity '[]' accommodation of range of motion '[]' UE functional control '[x]' accommodation for seating system '[]'  ????? '[x]' provide lateral trunk support '[]' accommodate deformity '[x]' provide posterior trunk support '[x]' provide lumbar/sacral support '[x]' support trunk in midline '[]' Pressure relief over spinal processes  '[x]'  Seat Strawberry M2 '[]' impaired sensation  '[]' decubitus ulcers present '[]' history of pressure ulceration '[]' prevent pelvic extension '[x]' low maintenance  '[x]' stabilize pelvis  '[]' accommodate obliquity '[]' accommodate multiple deformity '[x]' neutralize lower extremity position '[x]' increase pressure distribution '[]'  ?????  '[x]'  Pelvic/thigh support  '[x]'  Lateral thigh guide '[]'  Distal medial pad  '[]'  Distal lateral pad '[]'  pelvis in neutral '[]' accommodate pelvis '[x]'  position upper legs '[x]'  alignment '[]'  accommodate ROM '[]'  decr adduction '[x]' accommodate tone '[x]' removable for transfers '[x]' decr abduction  '[]'  Lateral trunk Supports '[]'  Lt     '[]'   Rt '[]' decrease lateral trunk leaning '[]' control tone '[]' contour for increased contact '[]' safety  '[]' accommodate asymmetry '[]'  ?????  '[x]'  Mounting hardware  '[]' lateral trunk supports  '[]' back   '[]' seat '[x]' headrest      '[x]'  thigh support '[]' fixed   '[x]' swing away '[]' attach seat platform/cushion to w/c frame '[]' attach  back cushion to w/c frame '[]' mount postural supports '[x]' mount headrest  '[x]' swing medial thigh support away '[]' swing lateral supports away for transfers  '[]'  ?????    Armrests  '[]' fixed '[x]' adjustable height '[]' removable   '[]' swing away  '[x]' flip back   '[]' reclining '[x]' full length pads '[]' desk    '[]' pads tubular  '[x]' provide support with elbow at 90   '[]' provide support for w/c tray '[x]' change of height/angles for variable activities '[x]' remove for transfers '[]' allow to come closer to table top '[]' remove for access to tables '[]'  ?????  Hangers/ Leg rests  '[]' 60 '[]' 70 '[]' 90 '[x]' elevating '[]' heavy duty  '[x]' articulating '[]' fixed '[]' lift off '[]' swing away     '[x]' power '[x]' provide LE support  '[x]' accommodate to hamstring tightness '[x]' elevate legs during recline   '[x]' provide change in position for Legs '[x]' Maintain placement of feet on footplate '[]' durability '[x]' enable transfers '[x]' decrease edema '[]' Accommodate lower leg length '[]'  ?????  Foot support Footplate    '[]' Lt  '[]'  Rt  '[x]'  Center mount '[x]' flip up     '[x]' depth/angle adjustable '[]' Amputee adapter    '[]'  Lt     '[]'  Rt '[x]' provide foot support '[x]' accommodate to ankle ROM '[x]' transfers '[]' Provide support for residual extremity '[]'  allow foot to go under wheelchair base '[x]'  decrease tone  '[]'  ?????  '[]'  Ankle strap/heel loops '[]' support foot on foot support '[]' decrease extraneous movement '[]' provide input to heel  '[]' protect foot  Tires: '[]' pneumatic  '[x]' flat free inserts  '[]' solid  '[x]' decrease maintenance  '[x]' prevent frequent flats '[]' increase shock absorbency '[]' decrease pain from road shock '[]' decrease spasms from road shock '[]'  ?????  '[x]'  Headrest  '[x]' provide posterior head support '[x]' provide posterior neck support '[]' provide lateral head support '[]' provide anterior head support '[x]' support during tilt and recline '[]' improve feeding   '[]' improve respiration '[]' placement of switches '[x]' safety  '[]' accommodate ROM  '[]' accommodate tone '[]' improve visual orientation  '[]'   Anterior chest strap '[]'  Vest '[]'  Shoulder retractors  '[]' decrease forward movement of shoulder '[]' accommodation of TLSO '[]' decrease forward movement of trunk '[]' decrease shoulder elevation '[]' added abdominal support '[]' alignment '[]' assistance with shoulder control  '[]'  ?????  Pelvic Positioner '[x]' Belt '[]' SubASIS bar '[]' Dual Pull '[x]' stabilize tone '[x]' decrease falling out of chair/ **will not Decr potential for sliding due to pelvic tilting '[]' prevent excessive rotation '[]' pad for protection over boney prominence '[]' prominence comfort '[]' special pull angle to control rotation '[]'  ?????  Upper Extremity Support '[x]' L   '[]'  R '[x]' Arm trough    '[]' hand support '[]'  tray       '[]' full tray '[]' swivel mount '[]' decrease edema      '[]' decrease subluxation   '[x]' control tone   '[]' placement for AAC/Computer/EADL '[x]' decrease gravitational pull on shoulders '[x]' provide midline positioning '[x]' provide support to increase UE function '[x]' provide hand support in natural position '[]' provide work surface   POWER WHEELCHAIR CONTROLS  '[x]' Proportional  '[]' Non-Proportional Type Joystick '[]' Left  '[x]' Right '[x]' provides access for controlling wheelchair   '[]' lacks motor control to operate proportional drive control '[]' unable to understand proportional controls  Actuator Control Module  '[]' Single  '[x]' Multiple   '[x]' Allow the client to operate the power seat function(s) through the joystick control   '[]' Safety Reset Switches '[]' Used to change modes and stop the wheelchair when driving in latch mode    '[x]' Upgraded Electronics   '[x]' programming for accurate control '[x]' progressive Disease/changing  condition '[]' non-proportional drive control needed '[x]' Needed in order to operate power seat functions through joystick control   '[]' Display box '[]' Allows user to see in which mode and drive the wheelchair is set  '[]' necessary for alternate controls    '[]' Digital interface electronics '[]' Allows w/c to operate when using alternative drive controls  '[]' ASL  Head Array '[]' Allows client to operate wheelchair  through switches placed in tri-panel headrest  '[]' Sip and puff with tubing kit '[]' needed to operate sip and puff drive controls  '[]' Upgraded tracking electronics '[]' increase safety when driving '[]' correct tracking when on uneven surfaces  '[x]' Mount for switches or joystick '[x]' Attaches switches to w/c  '[x]' Swing away for access or transfers '[]' midline for optimal placement '[]' provides for consistent access  '[]' Attendant controlled joystick plus mount '[]' safety '[]' long distance driving '[]' operation of seat functions '[]' compliance with transportation regulations '[]'  ?????    Rear wheel placement/Axle adjustability '[]' None '[]' semi adjustable '[]' fully adjustable  '[]' improved UE access to wheels '[]' improved stability '[]' changing angle in space for improvement of postural stability '[]' 1-arm drive access '[]' amputee pad placement '[]'  ?????  Wheel rims/ hand rims  '[]' metal  '[]' plastic coated '[]' oblique projections '[]' vertical projections '[]' Provide ability to propel manual wheelchair  '[]'  Increase self-propulsion with hand weakness/decreased grasp  Push handles '[]' extended  '[]' angle adjustable  '[]' standard '[]' caregiver access '[]' caregiver assist '[]' allows "hooking" to enable increased ability to perform ADLs or maintain balance  One armed device  '[]' Lt   '[]' Rt '[]' enable propulsion of manual wheelchair with one arm   '[]'  ?????   Brake/wheel lock extension '[]'  Lt   '[]'  Rt '[]' increase indep in applying wheel locks   '[]' Side guards '[]' prevent clothing getting caught in wheel or becoming soiled '[]'  prevent skin tears/abrasions  Battery: Group 34 x 2 '[x]' to power wheelchair ?????  Other: ????? ????? ?????  The above equipment has a life- long use expectancy. Growth and changes in medical and/or functional conditions would be the exceptions. This is to certify that the therapist has no financial relationship with durable medical provider or manufacturer. The therapist will not receive remuneration  of any kind for the equipment recommended in this evaluation.   Patient has mobility limitation that significantly impairs safe, timely participation in one or more mobility related ADL's.  (bathing, toileting, feeding, dressing, grooming, moving from room to room)                                                             '[x]'  Yes '[]'  No Will mobility device sufficiently improve ability to participate and/or be aided in participation of MRADL's?         '[x]'  Yes '[]'  No Can limitation be compensated for with use of a cane or walker?                                                                                '[]'  Yes '[x]'  No Does patient or caregiver demonstrate ability/potential ability & willingness to safely use the mobility device?   '[x]'  Yes '[]'  No Does patient's home  environment support use of recommended mobility device?                                                    '[x]'  Yes '[]'  No Does patient have sufficient upper extremity function necessary to functionally propel a manual wheelchair?    '[]'  Yes '[x]'  No Does patient have sufficient strength and trunk stability to safely operate a POV (scooter)?                                  '[]'  Yes '[x]'  No Does patient need additional features/benefits provided by a power wheelchair for MRADL's in the home?       '[x]'  Yes '[]'  No Does the patient demonstrate the ability to safely use a power wheelchair?                                                              '[x]'  Yes '[]'  No  Therapist Name Printed: ????? Date: ?????  Therapist's Signature:   Date:   Supplier's Name Printed: ????? Date: ?????  Supplier's Signature:   Date:  Patient/Caregiver Signature:   Date:     This is to certify that I have read this evaluation and do agree with the content within:    Physician's Name Printed: ?????  Physician's Signature:  Date:     This is to certify that I, the above signed therapist have the following affiliations: '[]'  This DME provider '[]'  Manufacturer of  recommended equipment '[]'  Patient's long term care facility '[x]'  None of the above     Anzel Kearse W., PT    .                              G-Codes - 06/24/15 1257    Functional Assessment Tool Used w/c eval-max assist transfers   Functional Limitation Self care   Self Care Current Status (D1761) At least 60 percent but less than 80 percent impaired, limited or restricted   Self Care Goal Status (Y0737) At least 60 percent but less than 80 percent impaired, limited or restricted   Self Care Discharge Status [phone]) At least 60 percent but less than 80 percent impaired, limited or restricted       Problem List Patient Active Problem List   Diagnosis Date Noted  . Multiple sclerosis ([place]) 04/27/2015  . Gait disturbance 04/27/2015  . Urinary disorder 04/27/2015  . Other fatigue 04/27/2015  . Left hand weakness 04/27/2015  . Bilateral leg weakness 04/27/2015    Paizley Ramella W. 06-24-15, 1:00 PM Frazier Butt., PT    [facility] [address] [place], [place], [address] Phone: [phone]   Fax:  [phone]  Name: Lindsay Schneider MRN: [id number] Date of Birth: [birth year]

## 2015-06-23 ENCOUNTER — Ambulatory Visit
Admission: RE | Admit: 2015-06-23 | Discharge: 2015-06-23 | Disposition: A | Discharge status: Home / Self Care | Payer: Medicaid Other | Source: Ambulatory Visit | Attending: Neurology | Admitting: Neurology

## 2015-06-23 ENCOUNTER — Other Ambulatory Visit: Payer: Self-pay | Admitting: Internal Medicine

## 2015-06-23 ENCOUNTER — Ambulatory Visit
Admission: RE | Admit: 2015-06-23 | Discharge: 2015-06-23 | Disposition: A | Discharge status: Home / Self Care | Payer: Medicaid Other | Source: Ambulatory Visit | Attending: Internal Medicine | Admitting: Internal Medicine

## 2015-06-23 DIAGNOSIS — R269 Unspecified abnormalities of gait and mobility: Secondary | ICD-10-CM

## 2015-06-23 DIAGNOSIS — M25562 Pain in left knee: Secondary | ICD-10-CM

## 2015-06-23 DIAGNOSIS — G35 Multiple sclerosis: Secondary | ICD-10-CM

## 2015-06-23 DIAGNOSIS — M25552 Pain in left hip: Secondary | ICD-10-CM

## 2015-06-23 IMAGING — CR DG HIP (WITH OR WITHOUT PELVIS) 2-3V*L*
2 series · 2 of 2 positions shown · non-contrast
Comparison: No prior.

CLINICAL DATA: Pain.  No reported injury.

EXAM:
DG HIP (WITH OR WITHOUT PELVIS) 2-3V LEFT

[t hip ap left]
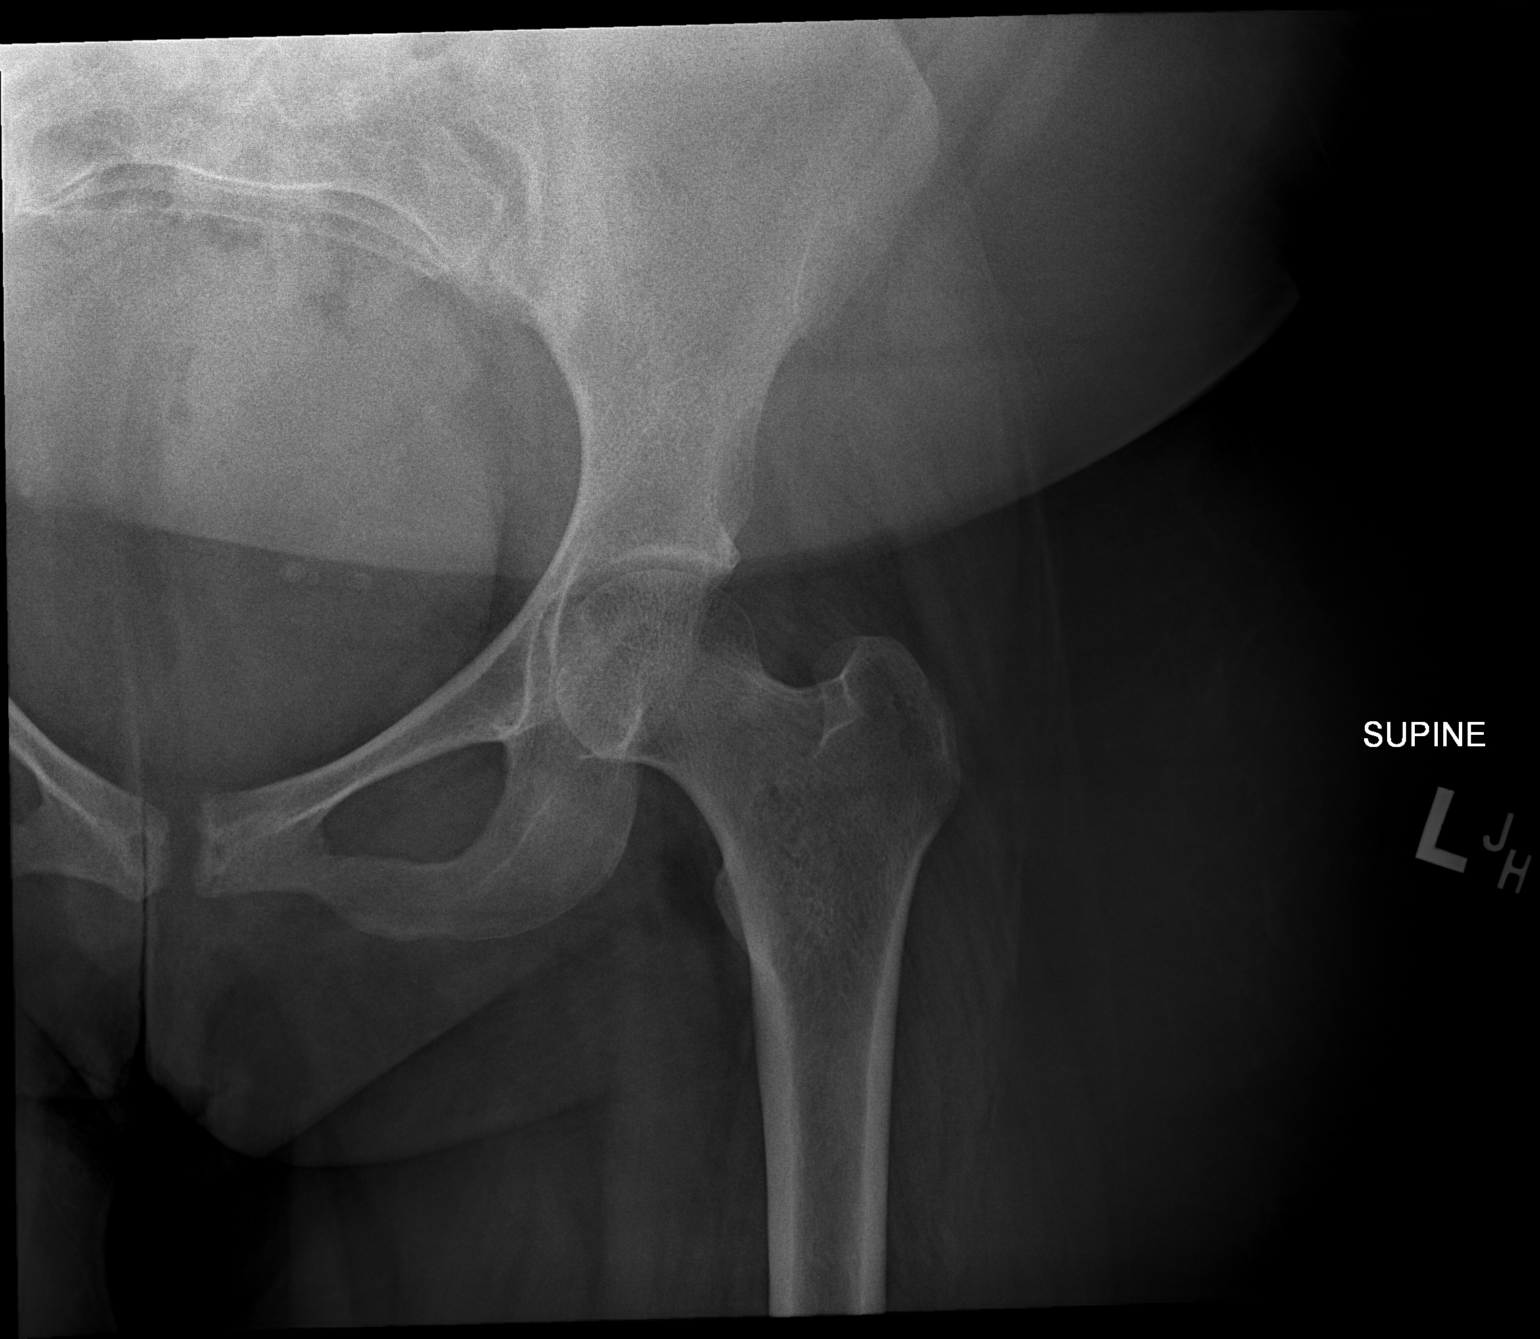

[t hip frog left]
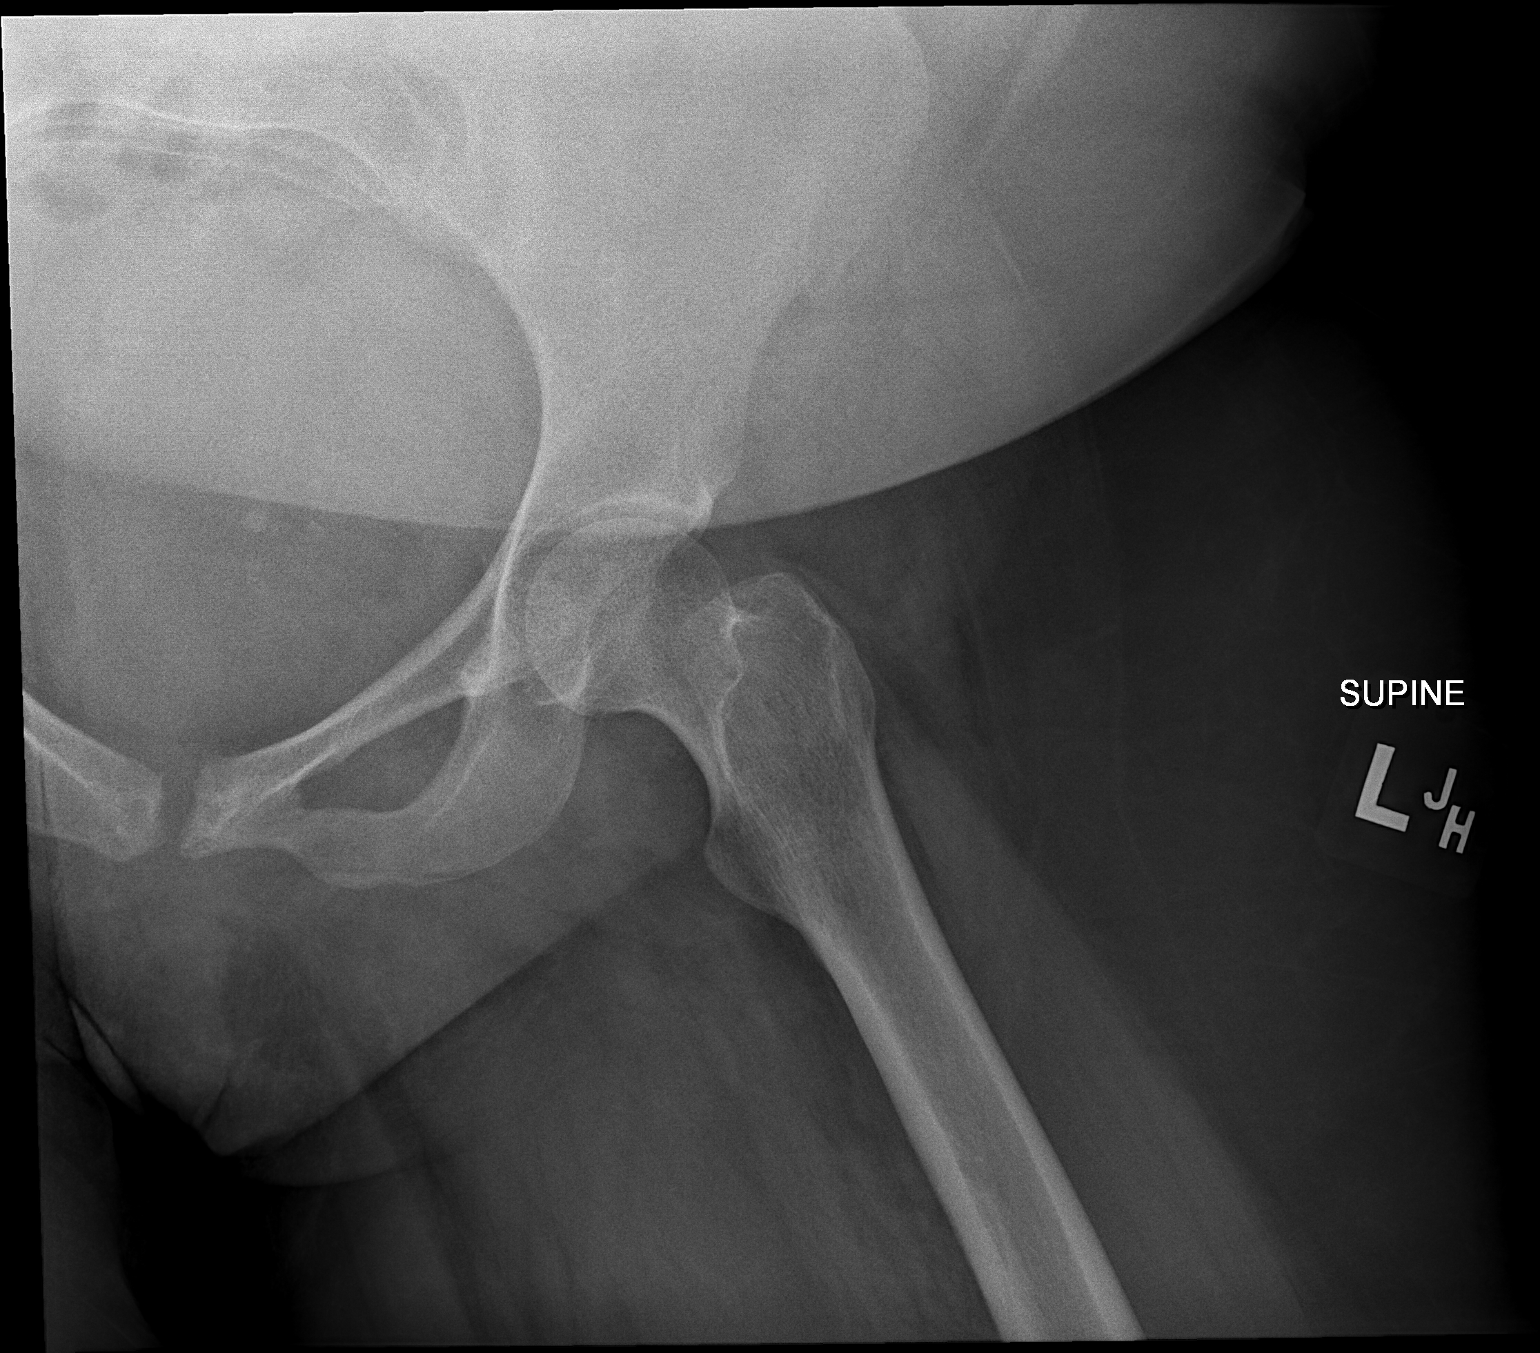

[2 of 2 positions shown; findings below may reference images not displayed]

FINDINGS: No acute bony or joint abnormality identified. No evidence of
fracture or dislocation. Pelvic calcifications consistent
phleboliths.
IMPRESSION: No acute abnormality.

## 2015-06-23 IMAGING — CR DG KNEE COMPLETE 4+V*L*
4 series · 4 of 4 positions shown · non-contrast
Comparison: None in PACs

CLINICAL DATA: Left knee instability with limited range of motion,
study is limited due to the patient's clinical condition.

EXAM:
LEFT KNEE - COMPLETE 4+ VIEW

[t knee ap left]
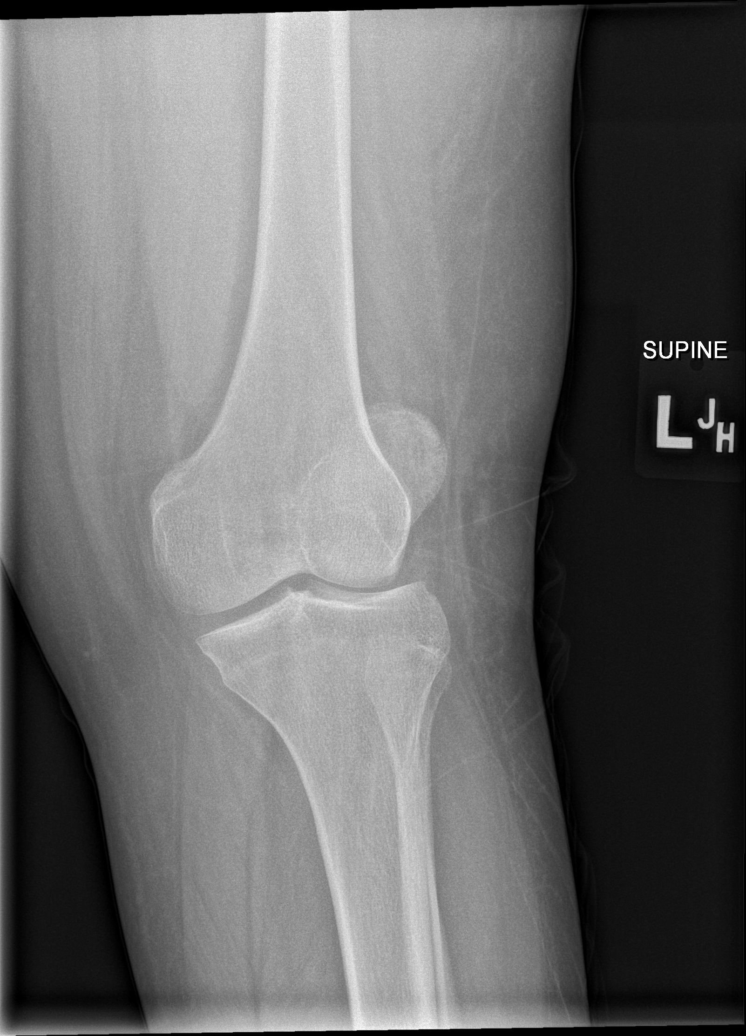

[t knee obl left (1 of 2)]
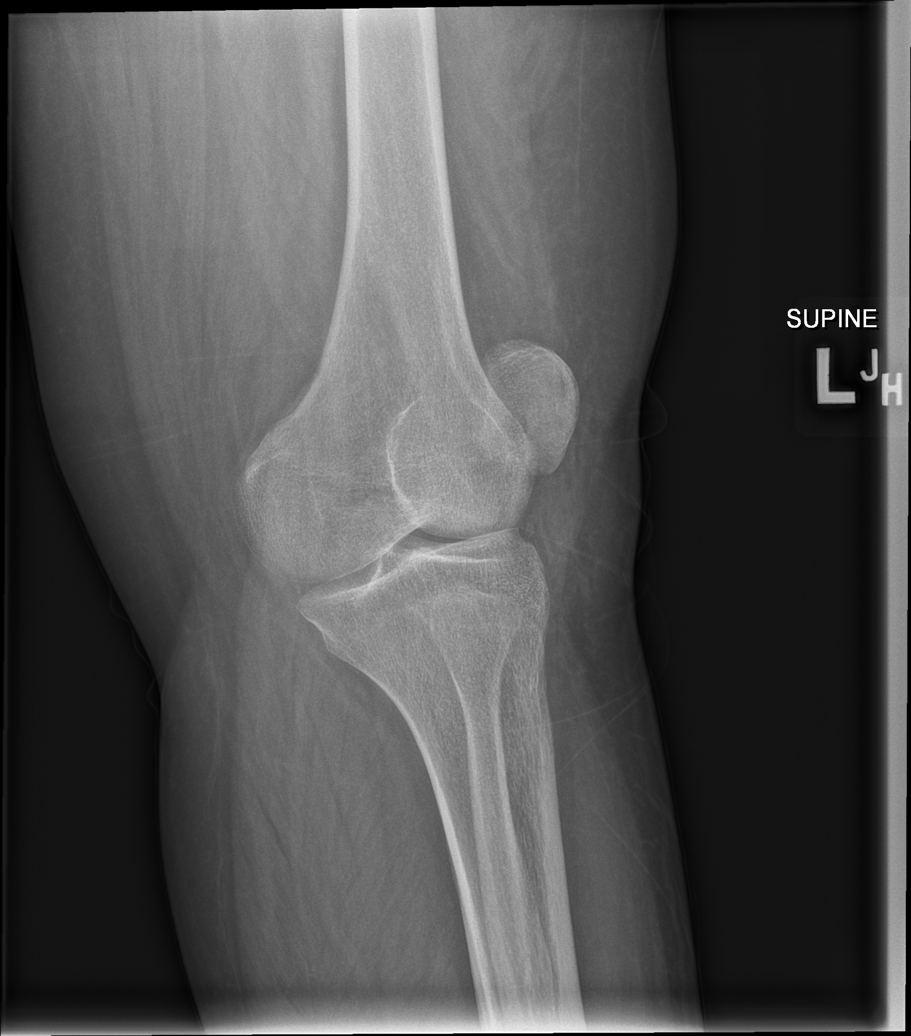

[t knee obl left (2 of 2)]
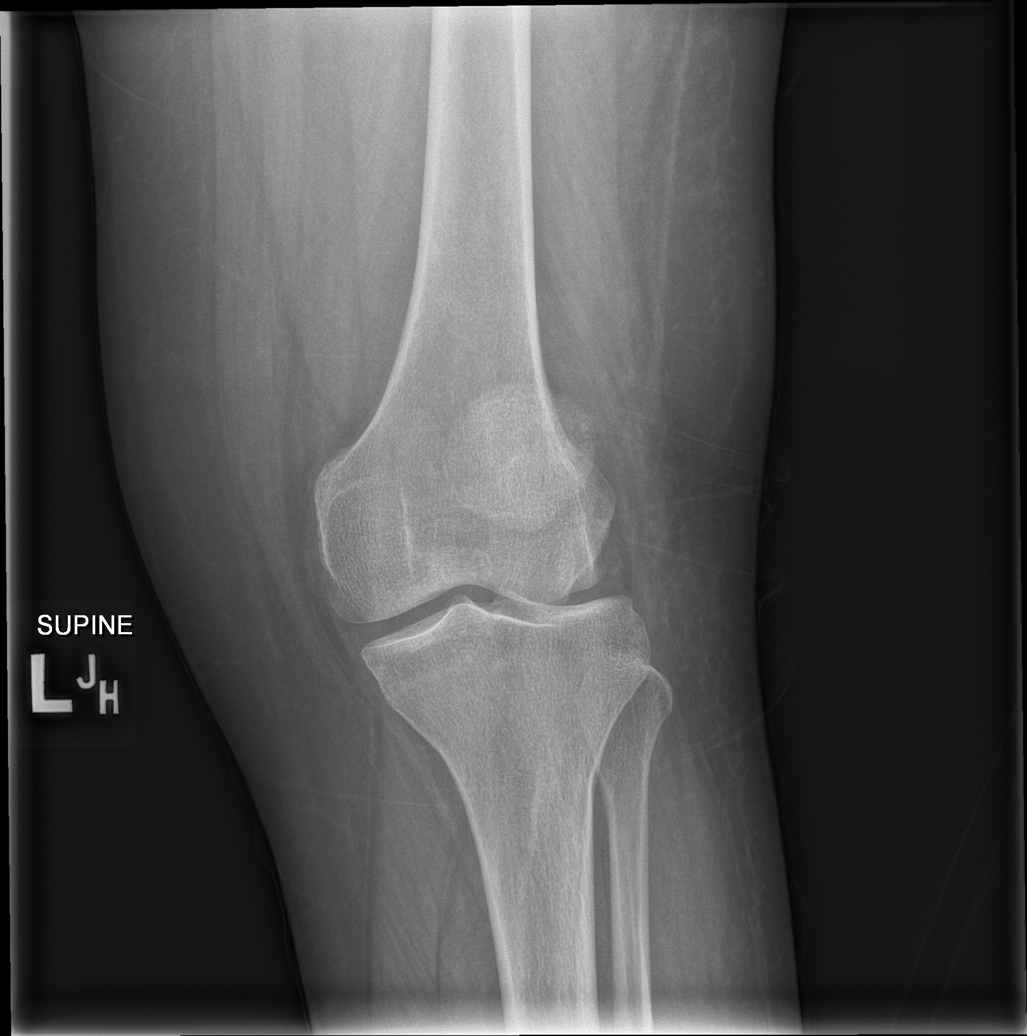

[x knee lat left]
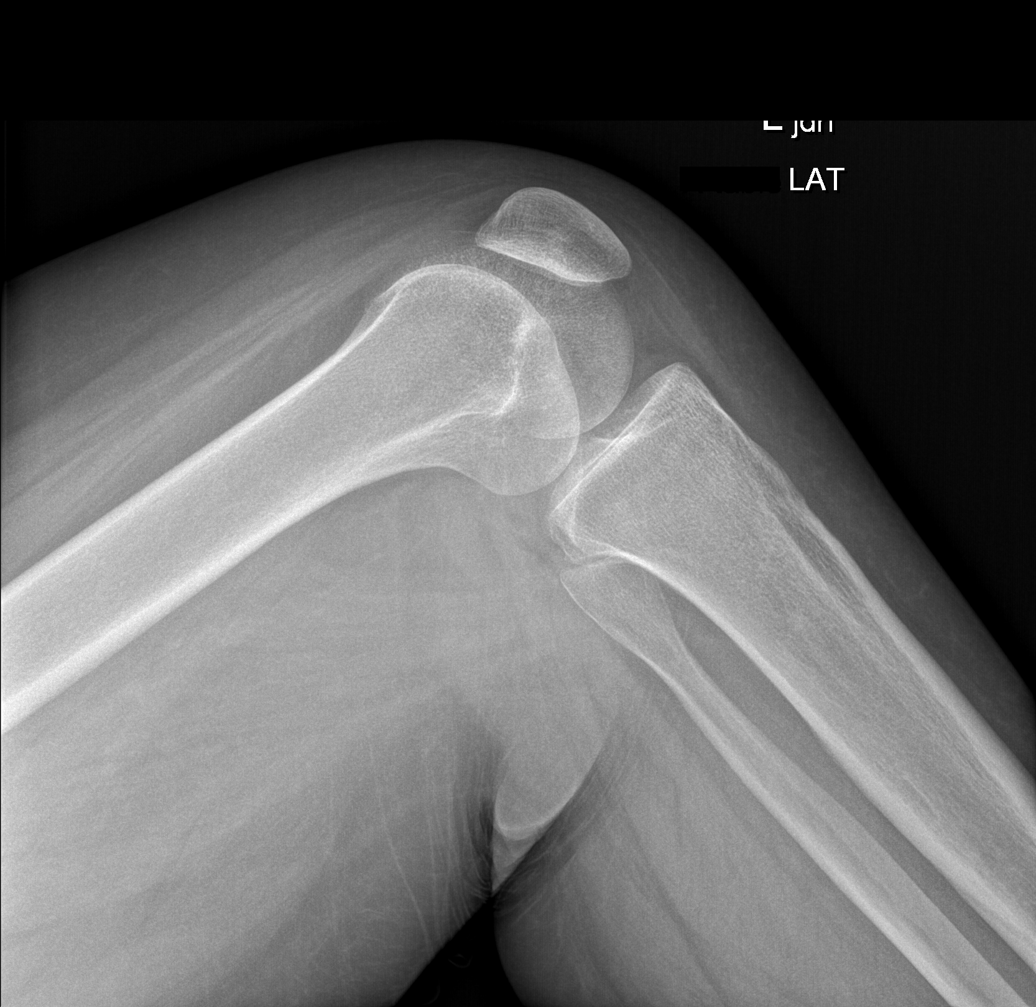

[4 of 4 positions shown; findings below may reference images not displayed]

FINDINGS: The bones of the left knee are adequately mineralized. The medial
and lateral joint spaces are reasonably well-maintained as is the
patellofemoral joint. There is no joint effusion. There is no acute
fracture nor dislocation.
IMPRESSION: There is no acute or significant chronic bony abnormality of the
left knee.

## 2015-06-23 MED ORDER — GADOBENATE DIMEGLUMINE 529 MG/ML IV SOLN
13.0000 mL | Freq: Once | INTRAVENOUS | Status: AC | PRN
  Administered 2015-06-23: 13 mL via INTRAVENOUS

## 2015-06-28 ENCOUNTER — Encounter (HOSPITAL_COMMUNITY): Admission: RE | Admit: 2015-06-28 | Payer: Medicaid Other | Source: Ambulatory Visit

## 2015-06-28 ENCOUNTER — Telehealth: Payer: Self-pay | Admitting: *Deleted

## 2015-06-28 NOTE — Telephone Encounter (Signed)
LMTC./fim 

## 2015-06-28 NOTE — Telephone Encounter (Signed)
-----   Message from Asa Lente, MD sent at 06/27/2015  6:15 PM EST ----- Please let her know that the MRIs do not show any brand new changes

## 2015-07-02 ENCOUNTER — Telehealth: Payer: Self-pay | Admitting: *Deleted

## 2015-07-02 NOTE — Telephone Encounter (Signed)
I have spoken with Lindsay Schneider this afternoon, and per RAS, advised that mri's do not show any brand new changes.  She verbalized understanding of same/fim

## 2015-07-02 NOTE — Telephone Encounter (Signed)
-----   Message from Richard A Sater, MD sent at 06/27/2015  6:15 PM EST ----- Please let her know that the MRIs do not show any brand new changes 

## 2015-07-09 ENCOUNTER — Encounter (HOSPITAL_COMMUNITY): Payer: Self-pay

## 2015-07-09 ENCOUNTER — Encounter (HOSPITAL_COMMUNITY)
Admission: RE | Admit: 2015-07-09 | Discharge: 2015-07-09 | Disposition: A | Discharge status: Home / Self Care | Payer: Medicaid Other | Source: Ambulatory Visit | Attending: Neurology | Admitting: Neurology

## 2015-07-09 DIAGNOSIS — G35 Multiple sclerosis: Secondary | ICD-10-CM | POA: Diagnosis present

## 2015-07-09 MED ORDER — SODIUM CHLORIDE 0.9 % IV SOLN
INTRAVENOUS | Status: DC
  Administered 2015-07-09: 250 mL via INTRAVENOUS

## 2015-07-09 MED ORDER — SODIUM CHLORIDE 0.9 % IV SOLN
300.0000 mg | INTRAVENOUS | Status: DC
  Administered 2015-07-09: 300 mg via INTRAVENOUS
  Filled 2015-07-09: qty 15

## 2015-07-09 NOTE — Progress Notes (Signed)
Uneventful infusion of TYSABRI and after going to the BR with assist pt states that she would like to leave. She stayed 20 minutes of the suggested 1 hour TOUCH post infusion  Observation. She states she will contact her neurologist for any questions or concerns

## 2015-07-27 ENCOUNTER — Encounter (HOSPITAL_COMMUNITY): Payer: Medicaid Other

## 2015-07-29 ENCOUNTER — Encounter: Payer: Self-pay | Admitting: *Deleted

## 2015-08-09 ENCOUNTER — Encounter (HOSPITAL_COMMUNITY): Payer: Self-pay

## 2015-08-09 ENCOUNTER — Encounter (HOSPITAL_COMMUNITY)
Admission: RE | Admit: 2015-08-09 | Discharge: 2015-08-09 | Disposition: A | Discharge status: Home / Self Care | Payer: Medicaid Other | Source: Ambulatory Visit | Attending: Neurology | Admitting: Neurology

## 2015-08-09 DIAGNOSIS — G35 Multiple sclerosis: Secondary | ICD-10-CM | POA: Insufficient documentation

## 2015-08-09 MED ORDER — SODIUM CHLORIDE 0.9 % IV SOLN
300.0000 mg | INTRAVENOUS | Status: DC
  Administered 2015-08-09: 300 mg via INTRAVENOUS
  Filled 2015-08-09: qty 15

## 2015-08-09 MED ORDER — SODIUM CHLORIDE 0.9 % IV SOLN
INTRAVENOUS | Status: DC
  Administered 2015-08-09: 12:00:00 via INTRAVENOUS

## 2015-08-09 NOTE — Progress Notes (Signed)
Patient stayed 26 min of 1 hour post observation

## 2015-08-30 ENCOUNTER — Ambulatory Visit (INDEPENDENT_AMBULATORY_CARE_PROVIDER_SITE_OTHER): Payer: Medicaid Other | Admitting: Neurology

## 2015-08-30 ENCOUNTER — Encounter: Payer: Self-pay | Admitting: Neurology

## 2015-08-30 VITALS — BP 128/84 | HR 64 | Resp 16 | Ht 63.0 in | Wt 150.0 lb

## 2015-08-30 DIAGNOSIS — M6289 Other specified disorders of muscle: Secondary | ICD-10-CM

## 2015-08-30 DIAGNOSIS — G35 Multiple sclerosis: Secondary | ICD-10-CM | POA: Diagnosis not present

## 2015-08-30 DIAGNOSIS — R5383 Other fatigue: Secondary | ICD-10-CM | POA: Diagnosis not present

## 2015-08-30 DIAGNOSIS — N399 Disorder of urinary system, unspecified: Secondary | ICD-10-CM

## 2015-08-30 DIAGNOSIS — R29898 Other symptoms and signs involving the musculoskeletal system: Secondary | ICD-10-CM

## 2015-08-30 DIAGNOSIS — R269 Unspecified abnormalities of gait and mobility: Secondary | ICD-10-CM

## 2015-08-30 MED ORDER — OXYBUTYNIN CHLORIDE 5 MG PO TABS: 5.0000 mg | ORAL_TABLET | Freq: Three times a day (TID) | ORAL | Status: DC

## 2015-08-30 MED ORDER — BACLOFEN 10 MG PO TABS: 10.0000 mg | ORAL_TABLET | Freq: Three times a day (TID) | ORAL | Status: DC

## 2015-08-30 MED ORDER — TIZANIDINE HCL 4 MG PO TABS: 4.0000 mg | ORAL_TABLET | Freq: Three times a day (TID) | ORAL | Status: DC

## 2015-08-30 NOTE — Progress Notes (Signed)
[facility]  PATIENT: Lindsay Schneider DOB: November 02, 1985  REFERRING DOCTOR OR PCP:   SOURCE:   _________________________________   HISTORICAL  CHIEF COMPLAINT:  Chief Complaint  Patient presents with  . Multiple Sclerosis    Sts. she continues to tolerate Tysabri well.  Her infusions are done at [facility].  Last infusion was 08-09-15.  JCV ab was last checked around 6 mos. ago.  Sts. she continues to have difficulty with left leg locking up on her when she stands.  She also continues to c/o intermittent numbness bilat feet, left worse than right./fim    HISTORY OF PRESENT ILLNESS:  Lindsay Schneider is a 30 year old woman who was diagnosed with MS in 2011 after presenting with left sided arm and leg numbness and weakness.   She is on Tysabri. She does not think she has had an exacerbation while on it. She tolerates it well. JCV antibody was about 6 months ago. Her last infusion was 08/09/2015.  Gait/strength/sensation: Her main problem is poor gait and poor use of the left arm.    She has difficulty getting around the house to do her activities of daily living such as getting to the bathroom, kitchen or other rooms of the house.    She cannot walk with a cane or walker.    She uses an Mining engineer wheelchair in her house and a lightweight wheelchair when she goes outside or to appointments.    Her left arm is spastic and weaker than the right.   She has some clumsiness in her limbs, too.    She has a lot of spasticity in both legs and the left arm. The legs sometimes have spasms that take a minute to break. This often happens when transferring.   .  Bladder:   She has difficulty with her bladder. She has urinary urgency and frequency and will have occasional incontinence if she cannot get to the bathroom and transfer in time.  Oxybutynin has helped and she has fewer episodes of incontinence now.     Vision:   She denies any difficulty with her vision.  In bright  lights, colors seem distorted a times.    Fatigue:   She notes difficulty with fatigue. Fatigue is mostly physical.   She feels she gets winded easily. This is much worse when she tires herself out moving the wheelchair or she is hot.   She is sleeping better on tizanidine/baclofen combo.    Mood/cognition:   She notes depression but no anxiety. She cries at times and gets frustrated due to her medical issues and disability. Zoloft was tried but she did not get a benefit. She has had some episodes of easy crying.   She denies any major difficulties with cognitive function. She sometimes has difficulty focusing.  MS History:  In 2011, onset of left arm and leg numbness and weakness.  Her symptoms develop over 1 day. She had an MRI performed with lesions consistent with MS. She started to see Dr. Terrace Arabia here at [facility]. She was placed on Rebif. However, she stopped because her legs felt weaker when she was on it. She then transferred her care to Dr. Renne Crigler at [facility].     She was started on Tysabri.  She had several infusions of Tysabri but transportation was difficult to get to [facility] Therefore, Dr. Renne Crigler switched her to Rituxan although she tolerated the medication well. She had a significant relapse with bilateral leg weakness within a  couple weeks of the infusion. Therefore, 6 months later, she started Tysabri again. Due to difficulties with transportation, she transferred care to [place] in October 2016.   REVIEW OF SYSTEMS: Constitutional: No fevers, chills, sweats, or change in appetite.   She has fatigue and poor sleep.    Eyes: No visual changes, double vision, eye pain Ear, nose and throat: No hearing loss, ear pain, nasal congestion, sore throat Cardiovascular: No chest pain, palpitations Respiratory: No shortness of breath at rest or with exertion.   No wheezes GastrointestinaI: No nausea, vomiting, diarrhea, abdominal pain, fecal incontinence Genitourinary: Shehas urinary urgency and frequency  with occ incontinence.    Hasnocturia. Musculoskeletal: No neck pain, back pain Integumentary: No rash, pruritus, skin lesions Neurological: as above Psychiatric:   Some depression at this time.  No anxiety Endocrine: No palpitations, diaphoresis, change in appetite, change in weigh or increased thirst Hematologic/Lymphatic: No anemia, purpura, petechiae. Allergic/Immunologic: No itchy/runny eyes, nasal congestion, recent allergic reactions, rashes  ALLERGIES: No Known Allergies  HOME MEDICATIONS:  Current outpatient prescriptions:  .  baclofen (LIORESAL) 5 mg TABS, Take 10 mg by mouth 3 (three) times daily., Disp: , Rfl:  .  Dalfampridine (AMPYRA PO), Take 10 mg by mouth. , Disp: , Rfl:  .  natalizumab (TYSABRI) 300 MG/15ML injection, Inject 300 mg into the vein every 30 (thirty) days., Disp: , Rfl:  .  oxybutynin (DITROPAN) 5 MG tablet, Take 1 tablet (5 mg total) by mouth 3 (three) times daily., Disp: 60 tablet, Rfl: 11 .  tiZANidine (ZANAFLEX) 4 MG tablet, Take 1 tablet (4 mg total) by mouth every 6 (six) hours as needed for muscle spasms., Disp: 90 tablet, Rfl: 5 .  medroxyPROGESTERone (DEPO-PROVERA) 150 MG/ML injection, Inject 150 mg into the muscle every 3 (three) months. Reported on 08/30/2015, Disp: , Rfl:   PAST MEDICAL HISTORY: Past Medical History  Diagnosis Date  . MS (multiple sclerosis) (HCC)   . Movement disorder     PAST SURGICAL HISTORY: Past Surgical History  Procedure Laterality Date  . Cesarean section      FAMILY HISTORY: Family History  Problem Relation Age of Onset  . Asthma Mother   . Hypertension Father   . Healthy Sister   . Healthy Brother     SOCIAL HISTORY:  Social History   Social History  . Marital Status: Single    Spouse Name: N/A  . Number of Children: N/A  . Years of Education: N/A   Occupational History  . Not on file.   Social History Main Topics  . Smoking status: Never Smoker   . Smokeless tobacco: Not on file  .  Alcohol Use: No  . Drug Use: No  . Sexual Activity: Not Currently    Birth Control/ Protection: Injection   Other Topics Concern  . Not on file   Social History Narrative     PHYSICAL EXAM  Filed Vitals:   08/30/15 1012  BP: 128/84  Pulse: 64  Resp: 16  Height: 5\' 3"  (1.6 m)  Weight: 150 lb (68.04 kg)    Body mass index is 26.58 kg/(m^2).   General: The patient is well-developed and well-nourished and in no acute distress  Eyes:  Funduscopic exam shows normal optic discs and retinal vessels.  Skin: Extremities are without significant edema.  Musculoskeletal:  Neck has good range of motion and is nontender.   Back is nontender  Neurologic Exam  Mental status: The patient is alert and oriented x 3 at the  time of the examination. The patient has apparent normal recent and remote memory, with an apparently normal attention span and concentration ability.   Speech is normal.  Cranial nerves: Extraocular movements are full. Pupils are equal, round, and reactive to light and accomodation.  Visual fields are full.  Facial symmetry is present. There is good facial sensation to soft touch bilaterally.Facial strength is normal.  Trapezius and sternocleidomastoid strength is normal. No dysarthria is noted.  The tongue is midline, and the patient has symmetric elevation of the soft palate. No obvious hearing deficits are noted.  Motor:  Muscle bulk is inormal and tone is greatly ncreased in her legs (L>R) and the left arm.   Right arm with good tone/strength.   Strength (R/L) is  2+ / 2- in hip flexors, 4-/3 knee extensors, 2+/1 ankle extensors, 2+ to 3/5 proximal and 3 to 4-/5 distal muscles of left arm.     Sensory: Sensory testing is intact to pinprick, soft touch and vibration sensation in all 4 extremities except left foot with reduced vibration  Coordination: Cerebellar testing reveals poor left finger-nose-finger and she can't do heel-to-shin bilaterally.  Gait and station:  She needs strong bilat support to stand and cannot walk even with assistance.      Reflexes: Deep tendon reflexes are increased in all limbs, worse in legs (clonus at ankles, spread at knees) and 3+ in left arm.      DIAGNOSTIC DATA (LABS, IMAGING, TESTING) - I reviewed patient records, labs, notes, testing and imaging myself where available.  Lab Results  Component Value Date   WBC 8.1 03/05/2012   HGB 12.9 03/05/2012   HCT 37.1 03/05/2012   MCV 83.2 03/05/2012   PLT 220 03/05/2012      Component Value Date/Time   NA 138 03/05/2012 1805   K 3.6 03/05/2012 1805   CL 105 03/05/2012 1805   CO2 21 03/05/2012 1805   GLUCOSE 96 03/05/2012 1805   BUN 7 03/05/2012 1805   CREATININE 0.58 03/05/2012 1805   CALCIUM 9.6 03/05/2012 1805   PROT 7.6 03/05/2012 1805   ALBUMIN 4.7 03/05/2012 1805   AST 14 03/05/2012 1805   ALT 9 03/05/2012 1805   ALKPHOS 76 03/05/2012 1805   BILITOT 0.7 03/05/2012 1805   GFRNONAA >90 03/05/2012 1805   GFRAA >90 03/05/2012 1805       ASSESSMENT AND PLAN  Multiple sclerosis (HCC)  Gait disturbance  Other fatigue  Left hand weakness  Urinary disorder    1.    Continue baclofen and tizanidine for spasticity. Try to work up to 3 pills a day. She will be getting Medicare soon and I will have her get some physical therapy.  2.   Continue Tysabri for the multiple sclerosis.    We will check her JCV antibody status today. If she is high titer JCV antibody, consider a change in therapy.  3.    Oxybutynin 5 mg tid for urinary symptoms.   If this worsens, consider adding Myrbetriq 5.   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 6.   Physical therapy to help with transfers Return to see me in 5-6 months or sooner if there are new or worsening neurologic symptoms.    Namrata Dangler A. Epimenio Foot, MD, PhD 08/30/2015, 10:45 AM Certified in Neurology, Clinical Neurophysiology, Sleep Medicine, Pain Medicine and Neuroimaging  [facility] [address], [address] [place], [place] [address] [phone]

## 2015-08-31 LAB — CBC WITH DIFFERENTIAL/PLATELET
Basophils Absolute: 0 10*3/uL (ref 0.0–0.2)
Basos: 0 %
EOS (ABSOLUTE): 0.1 10*3/uL (ref 0.0–0.4)
Eos: 1 %
Hematocrit: 34.6 % (ref 34.0–46.6)
Hemoglobin: 11.3 g/dL (ref 11.1–15.9)
IMMATURE GRANULOCYTES: 0 %
Immature Grans (Abs): 0 10*3/uL (ref 0.0–0.1)
Lymphocytes Absolute: 3.3 10*3/uL — ABNORMAL HIGH (ref 0.7–3.1)
Lymphs: 36 %
MCH: 27 pg (ref 26.6–33.0)
MCHC: 32.7 g/dL (ref 31.5–35.7)
MCV: 83 fL (ref 79–97)
MONOS ABS: 0.8 10*3/uL (ref 0.1–0.9)
Monocytes: 9 %
NEUTROS PCT: 54 %
Neutrophils Absolute: 4.9 10*3/uL (ref 1.4–7.0)
PLATELETS: 217 10*3/uL (ref 150–379)
RBC: 4.18 x10E6/uL (ref 3.77–5.28)
RDW: 15.8 % — AB (ref 12.3–15.4)
WBC: 9.1 10*3/uL (ref 3.4–10.8)

## 2015-09-06 ENCOUNTER — Encounter: Payer: Self-pay | Admitting: *Deleted

## 2015-09-06 ENCOUNTER — Encounter (HOSPITAL_COMMUNITY)
Admission: RE | Admit: 2015-09-06 | Discharge: 2015-09-06 | Disposition: A | Discharge status: Home / Self Care | Payer: Medicaid Other | Source: Ambulatory Visit | Attending: Neurology | Admitting: Neurology

## 2015-09-06 ENCOUNTER — Encounter (HOSPITAL_COMMUNITY): Payer: Self-pay

## 2015-09-06 DIAGNOSIS — G35 Multiple sclerosis: Secondary | ICD-10-CM | POA: Diagnosis present

## 2015-09-06 MED ORDER — SODIUM CHLORIDE 0.9 % IV SOLN
300.0000 mg | INTRAVENOUS | Status: DC
  Administered 2015-09-06: 300 mg via INTRAVENOUS
  Filled 2015-09-06: qty 15

## 2015-09-06 MED ORDER — SODIUM CHLORIDE 0.9 % IV SOLN
INTRAVENOUS | Status: DC
  Administered 2015-09-06: 11:00:00 via INTRAVENOUS

## 2015-09-06 NOTE — Discharge Instructions (Signed)

## 2015-09-06 NOTE — Progress Notes (Signed)
Post-Infusion Tysabri, pt.stayed 25 minutes post-infusion, pt. To follow-up with doctor with any problems.

## 2015-09-10 ENCOUNTER — Ambulatory Visit: Payer: Medicaid Other

## 2015-09-17 ENCOUNTER — Ambulatory Visit: Payer: Medicaid Other | Attending: Internal Medicine

## 2015-09-17 DIAGNOSIS — R29898 Other symptoms and signs involving the musculoskeletal system: Secondary | ICD-10-CM | POA: Diagnosis not present

## 2015-09-17 DIAGNOSIS — R293 Abnormal posture: Secondary | ICD-10-CM | POA: Diagnosis present

## 2015-09-17 DIAGNOSIS — R2681 Unsteadiness on feet: Secondary | ICD-10-CM

## 2015-09-17 DIAGNOSIS — R252 Cramp and spasm: Secondary | ICD-10-CM

## 2015-09-17 DIAGNOSIS — R258 Other abnormal involuntary movements: Secondary | ICD-10-CM | POA: Diagnosis present

## 2015-09-17 NOTE — Therapy (Addendum)
[facility] [address] [place], [place], [address] Phone: [phone]   Fax:  [phone]  Physical Therapy Evaluation  Patient Details  Name: Lindsay Schneider MRN: [id number] Date of Birth: [birth year] Referring Provider: [name]  Encounter Date: 09/17/2015      PT End of Session - 09/17/15 1617    Visit Number 1   Number of Visits 17   Date for PT Re-Evaluation 11/16/15   Authorization Type Medicaid with Medicare auth pending (per pt)   PT Start Time 1409   PT Stop Time 1445   PT Time Calculation (min) 36 min   Equipment Utilized During Treatment Gait belt   Activity Tolerance Other (comment)  Limited by spasticity   Behavior During Therapy [facility] for tasks assessed/performed      Past Medical History  Diagnosis Date  . MS (multiple sclerosis) (HCC)   . Movement disorder     Past Surgical History  Procedure Laterality Date  . Cesarean section      There were no vitals filed for this visit.  Visit Diagnosis:  Weakness of both legs - Plan: PT plan of care cert/re-cert  Spasticity - Plan: PT plan of care cert/re-cert  Posture abnormality - Plan: PT plan of care cert/re-cert  Unsteadiness - Plan: PT plan of care cert/re-cert      Subjective Assessment - 09/17/15 1414    Subjective Pt reports L sided weakness began in 2011, when she was diagnosed with MS, and has been becoming progressively worse, and her L knee hyperextends upon standing. Pt reports she requires help getting in/out of tub, assisting for bed<>w/c transfers, pt has not attempted amb. 2/2 L LE weakness.    Patient is accompained by: Family member  cousin-Jason   Limitations Standing;Walking;House hold activities   Patient Stated Goals Try to walk again, get braces to help my legs, try to get arm to relax.    Currently in Pain? No/denies            [facility] PT Assessment - 09/17/15 1417    Assessment   Medical Diagnosis MS and gait  disturbance    Referring Provider [name]   Onset Date/Surgical Date 08/24/09  pt reports L leg weakness getting worse   Prior Therapy OPPT neuro   Precautions   Precautions Fall   Precaution Comments due to level assist required for transfers and pt unable to attempt amb. 2/2 weakness   Restrictions   Weight Bearing Restrictions No   Balance Screen   Has the patient fallen in the past 6 months No   Has the patient had a decrease in activity level because of a fear of falling?  Yes   Is the patient reluctant to leave their home because of a fear of falling?  Yes   Home Environment   Living Environment Private residence   Living Arrangements Children;Other relatives;Other (Comment)   Available Help at Discharge Family   Type of Home House   Home Access Ramped entrance   Home Layout One level   Home Equipment Walker - 2 wheels;Transport chair;Electric scooter;Wheelchair - power;Tub bench  pt reports tub bench is no longer staying in place   Prior Function   Level of Independence Requires assistive device for independence   Vocation On disability  pt applying for Medicare   Leisure go out places, shopping   Cognition   Overall Cognitive Status Within Functional Limits for tasks assessed   Memory Impaired   Memory  Impairment Decreased short term memory   Sensation   Light Touch Appears Intact   Additional Comments Pt reported light touch intact, however, a prior visit pt reported decr. light touch   Coordination   Gross Motor Movements are Fluid and Coordinated No   Fine Motor Movements are Fluid and Coordinated No   Heel Shin Test Unable to perform in BLE's secondary to hypertonia, weakness   Posture/Postural Control   Posture/Postural Control Postural limitations   Postural Limitations Rounded Shoulders;Increased thoracic kyphosis;Flexed trunk;Weight shift right   Posture Comments Assessed in seated position as pt unable to obtain standing.   Tone   Assessment Location  Left Lower Extremity;Right Lower Extremity   ROM / Strength   AROM / PROM / Strength AROM;Strength;PROM   AROM   Overall AROM  Deficits   Overall AROM Comments AROM in B LEs limited 2/2 weakness and spasticity. Pt unable to perform active L hip ext or ankle dorisflexion. R hip flexion limited to <25% normal ROM, R knee ext/flex approx. 50% normal ROM.   PROM   Overall PROM  Deficits   Overall PROM Comments B knee ext obtained full PROM. B ankle DF limited 2/2 tone.   Strength   Overall Strength Deficits;Other (comment)   Overall Strength Comments Difficult to assess 2/2 spasticity.   Strength Assessment Site Knee;Hip   Right/Left Hip Right;Left   Right Hip Flexion 2-/5   Left Hip Flexion 1/5   Right/Left Knee Right;Left   Right Knee Flexion 2+/5   Right Knee Extension 3/5   Left Knee Flexion 1/5   Left Knee Extension 2/5   Right/Left Ankle Right;Left   Right Ankle Dorsiflexion 1/5   Right Ankle Plantar Flexion 2/5   Left Ankle Dorsiflexion 0/5   Left Ankle Plantar Flexion 2/5   Bed Mobility   Bed Mobility Supine to Sit;Sit to Supine;Rolling Right   Rolling Right 3: Mod assist   Rolling Right Details (indicate cue type and reason) Cues for sequencing and manual facilitation for R knee flexion.   Supine to Sit 1: +1 Total assist   Supine to Sit Details (indicate cue type and reason) Pt required cues for sequencing and assist for B knee flexion and to advance B LEs towards EOB.    Sit to Supine 2: Max assist   Sit to Supine - Details (indicate cue type and reason) Cues for sequencing to guide trunk and B LEs into supine. Pt noted to experience incr. in BLE tone (extension tone) once she obtained supine position.   Transfers   Transfers Squat Pivot Transfers   Squat Pivot Transfers 1: +2 Total assist   Squat Pivot Transfers: Patient Percentage 20%   Squat Pivot Transfer Details (indicate cue type and reason) Pt required +2 assist, one PT to ensure L knee did not buckle and one to  guide transfers and block R knee. Cues for sequencing, hand placement and to shift wt. in ant. direction (and head/hips relationship).   Number of Reps 2 sets   Ambulation/Gait   Ambulation/Gait No   Gait Comments Unable to attempt 2/2 weakness and decr. motor control.   RLE Tone   RLE Tone Modified Ashworth   RLE Tone   Modified Ashworth Scale for Grading Hypertonia RLE Slight increase in muscle tone, manifested by a catch, followed by minimal resistance throughout the remainder (less than half) of the ROM   LLE Tone   LLE Tone Modified Ashworth   LLE Tone   Modified Ashworth Scale for  Grading Hypertonia LLE More marked increase in muscle tone through most of the ROM, but affected part(s) easily moved                           PT Education - 09/17/15 1616    Education provided Yes   Education Details PT discussed frequency/duration of PT and POC. PT discussed waiting for Medicaid auth and for pt to f/u with pending Medicare approval.    Person(s) Educated Patient   Methods Explanation   Comprehension Verbalized understanding          PT Short Term Goals - 09/17/15 1621    PT SHORT TERM GOAL #1   Title Pt will perform HEP with assist of caregiver to improve flexibility/spasticity, strength and balance. Target date: 10/15/15   Baseline Pt does not have a current HEP.   Status New   PT SHORT TERM GOAL #2   Title Refer patient to orthotist to assess if patient appropriate candidate for bracing to ensure B ankle, and L knee stability during functional transfers.  Target date: 10/15/15.   Baseline Pt does not have a brace to assist in transfers or amb.   Status New   PT SHORT TERM GOAL #3   Title Pt will perform rolling in B directions with min A to improve functional mobility. Target date: 10/15/15   Baseline Pt requires mod A for R rolling.   Status New   PT SHORT TERM GOAL #4   Title Pt will perform squat pivot transfers (w/c<>mat) with max A to improve  functional mobility. Target date: 10/15/15   Baseline Pt performed squat pivot transfesrs with total A (+2 assist)   Status New           PT Long Term Goals - 09/17/15 1625    PT LONG TERM GOAL #1   Title Patient will perform squat pivot transfer from wheelchair <> mat table with mod A of single person to increase pt independence with funtional transfers and decrease physical burden on caregivers.  Target date: .11/12/15   Baseline Pt performed squat pivot transfesrs with total A (+2 assist)   Status New   PT LONG TERM GOAL #2   Title Pt will perform supine to/from sit with mod A to improve independence with bed mobility.  Target date: 11/12/15.   Baseline Pt currently requirea Max to Total A bed mobility.   Status New   PT LONG TERM GOAL #3   Title Patient/caregivers will verbalize understanding of available equipment options and resources to maximize pt's current function as well as anticipated functional status.  Target date: 11/12/15.   Baseline Patient/caregivers currently dependent for information on available DME and resources appropriate for patient.   PT LONG TERM GOAL #4   Title Trial standing/amb. if appropriate and write goal. Target date: 11/12/15   Baseline Pt unable to attempt standing 2/2 weakness and spasticity.   Status New               Plan - 09/17/15 1617    Clinical Impression Statement Pt is a pleasant 29y/o female presenting to OPPT neuro with hx of MS (Dx: 340/G35). Pt presenting with the following impairments: B LE weakness (LLE>RLE), spasticity in BLE, decreased coordination, decreased P/AROM, difficulty performing bed mobility and w/c<>mat transfers. Pt also experiences L UE spasticity and weakness, and would benefit from OT eval in order to assess pt's ability to perform dressing, bathing and eating.  Pt  would benefit from skilled PT to improve safety during functional mobility.    Pt will benefit from skilled therapeutic intervention in order to improve  on the following deficits Decreased mobility;Decreased balance;Decreased strength;Impaired sensation;Postural dysfunction;Impaired UE functional use;Increased muscle spasms;Impaired tone;Abnormal gait;Decreased coordination;Decreased activity tolerance;Decreased endurance;Decreased knowledge of use of DME;Decreased range of motion;Hypomobility   Rehab Potential Good   Clinical Impairments Affecting Rehab Potential Relies on family members for transportation; financial limitations   PT Frequency 2x / week   PT Duration 6 weeks   PT Treatment/Interventions ADLs/Self Care Home Management;Functional mobility training;DME Instruction;Neuromuscular re-education;Balance training;Therapeutic exercise;Therapeutic activities;Patient/family education;Orthotic Fit/Training;Energy conservation;Manual techniques;Wheelchair mobility training;Passive range of motion   PT Next Visit Plan  Consider standing frame for spasticity management.with transfers,  improve positioning, prevent contractures. Initial home stretching/strengthening program.   Recommended Other Services OT, if pt obtains Medicare   Consulted and Agree with Plan of Care Patient          G-Codes - 10-10-15 1628    Functional Assessment Tool Used Supine<>sit transfers: max A-total A, squat pivot transfers (w/c<>mat): total A (+2 assist), No current HEP   Functional Limitation Self care   Self Care Current Status (O1157) At least 60 percent but less than 80 percent impaired, limited or restricted   Self Care Goal Status (W6203) At least 40 percent but less than 60 percent impaired, limited or restricted       Problem List Patient Active Problem List   Diagnosis Date Noted  . Multiple sclerosis (HCC) 04/27/2015  . Gait disturbance 04/27/2015  . Urinary disorder 04/27/2015  . Other fatigue 04/27/2015  . Left hand weakness 04/27/2015  . Bilateral leg weakness 04/27/2015    Tahnee Cifuentes L 10/10/2015, 4:31 PM  [facility] [address] [place], [place], [address] Phone: [phone]   Fax:  [phone]  Name: Lindsay Schneider MRN: [id number] Date of Birth: [birth year]   Zerita Boers, PT,DPT 2015/10/10 4:31 PM Phone: [phone] Fax: [phone]

## 2015-10-06 ENCOUNTER — Encounter (HOSPITAL_COMMUNITY): Payer: Self-pay

## 2015-10-06 ENCOUNTER — Encounter (HOSPITAL_COMMUNITY)
Admission: RE | Admit: 2015-10-06 | Discharge: 2015-10-06 | Disposition: A | Discharge status: Home / Self Care | Payer: Medicaid Other | Source: Ambulatory Visit | Attending: Neurology | Admitting: Neurology

## 2015-10-06 DIAGNOSIS — G35 Multiple sclerosis: Secondary | ICD-10-CM | POA: Diagnosis not present

## 2015-10-06 MED ORDER — SODIUM CHLORIDE 0.9 % IV SOLN
300.0000 mg | INTRAVENOUS | Status: DC
  Administered 2015-10-06: 300 mg via INTRAVENOUS
  Filled 2015-10-06: qty 15

## 2015-10-06 MED ORDER — SODIUM CHLORIDE 0.9 % IV SOLN
INTRAVENOUS | Status: AC
  Administered 2015-10-06: 09:00:00 via INTRAVENOUS

## 2015-10-06 NOTE — Discharge Instructions (Signed)

## 2015-10-06 NOTE — Progress Notes (Signed)
Pt. Stayed 25 minutes post-infusion tysabri , pt. To follow-up with doctor with any problems.

## 2015-10-08 ENCOUNTER — Ambulatory Visit: Payer: Medicaid Other

## 2015-10-11 ENCOUNTER — Telehealth: Payer: Self-pay | Admitting: Neurology

## 2015-10-11 MED ORDER — BACLOFEN 10 MG PO TABS: ORAL_TABLET | ORAL | Status: DC

## 2015-10-11 NOTE — Telephone Encounter (Signed)
Patient called to advise baclofen (LIORESAL) 10 MG tablet was 3 pills, 3 times a day, now Rx is for 1 pill 3 times a day. Patient would like to speak to [name] about this.

## 2015-10-11 NOTE — Telephone Encounter (Signed)
Caryn Bee with [facility] is calling and needs verification on Rx baclofen 10 mg as to quantity.  Please call.

## 2015-10-11 NOTE — Telephone Encounter (Signed)
Per RAS last ov note, pt. was to try increasing Baclofen  to 3 tabs tid if tolerated.  She reports tolerating  tid well.  Does not make her too sleepy and "keeps me loose."  New rx. escribed to [facility] per request/fim

## 2015-10-11 NOTE — Telephone Encounter (Signed)
I have spoken with Caryn Bee and verified Baclofen rx. is 10mg , 3 tabs tid, so 30mg  tid/fim

## 2015-10-15 ENCOUNTER — Ambulatory Visit: Payer: Medicaid Other

## 2015-10-20 NOTE — Telephone Encounter (Signed)
Error

## 2015-10-22 ENCOUNTER — Ambulatory Visit: Payer: Medicaid Other

## 2015-10-28 ENCOUNTER — Ambulatory Visit: Payer: Medicaid Other

## 2015-11-03 ENCOUNTER — Encounter (HOSPITAL_COMMUNITY)
Admission: RE | Admit: 2015-11-03 | Discharge: 2015-11-03 | Disposition: A | Discharge status: Home / Self Care | Payer: Medicaid Other | Source: Ambulatory Visit | Attending: Neurology | Admitting: Neurology

## 2015-11-03 ENCOUNTER — Encounter (HOSPITAL_COMMUNITY): Payer: Self-pay

## 2015-11-03 DIAGNOSIS — G35 Multiple sclerosis: Secondary | ICD-10-CM | POA: Insufficient documentation

## 2015-11-03 MED ORDER — SODIUM CHLORIDE 0.9 % IV SOLN
INTRAVENOUS | Status: DC
  Administered 2015-11-03: 09:00:00 via INTRAVENOUS

## 2015-11-03 MED ORDER — SODIUM CHLORIDE 0.9 % IV SOLN
300.0000 mg | INTRAVENOUS | Status: DC
  Administered 2015-11-03: 300 mg via INTRAVENOUS
  Filled 2015-11-03: qty 15

## 2015-11-03 NOTE — Discharge Instructions (Signed)

## 2015-11-03 NOTE — Progress Notes (Addendum)
Uneventful Tysabri infusion.  Pt stayed 30 min. Post infusion.  Pt knows to call MD with questions/concerns. Assisted pt to bathroom before d/c home.  After using restroom pt was d/c via wheelchair to lobby with her cousin.

## 2015-11-04 ENCOUNTER — Ambulatory Visit: Payer: Medicaid Other

## 2015-11-11 ENCOUNTER — Ambulatory Visit: Payer: Medicaid Other | Attending: Internal Medicine

## 2015-11-11 DIAGNOSIS — M6281 Muscle weakness (generalized): Secondary | ICD-10-CM | POA: Insufficient documentation

## 2015-11-11 DIAGNOSIS — R2689 Other abnormalities of gait and mobility: Secondary | ICD-10-CM | POA: Insufficient documentation

## 2015-11-11 DIAGNOSIS — R278 Other lack of coordination: Secondary | ICD-10-CM

## 2015-11-11 NOTE — Therapy (Signed)
[facility] [address] [place], [place], [address] Phone: [phone]   Fax:  [phone]  Physical Therapy Treatment  Patient Details  Name: Lindsay Schneider MRN: [id number] Date of Birth: 1985-09-25 Referring Provider: [name]  Encounter Date: 11/11/2015      PT End of Session - 11/11/15 1609    Visit Number 2   Number of Visits 4  Medicaid limited visits to 3 treats   Date for PT Re-Evaluation 11/16/15   Authorization Type Medicaid with Medicare auth pending (per pt)   PT Start Time 1315   PT Stop Time 1358   PT Time Calculation (min) 43 min   Equipment Utilized During Treatment Gait belt   Activity Tolerance Patient tolerated treatment well   Behavior During Therapy [facility] for tasks assessed/performed      Past Medical History  Diagnosis Date  . MS (multiple sclerosis) (HCC)   . Movement disorder     Past Surgical History  Procedure Laterality Date  . Cesarean section      There were no vitals filed for this visit.      Subjective Assessment - 11/11/15 1319    Subjective Pt denied falls since last visit. Pt did report R LE spasticity is increasing.   Patient is accompained by: Family member  cousin-Jason in the lobby   Limitations Standing;Walking;House hold activities   Patient Stated Goals Try to walk again, get braces to help my legs, try to get arm to relax.    Currently in Pain? No/denies           Therex: Pt performed in manual w/c with cues for technique. Please see pt instructions for details.                      PT Education - 11/11/15 1607    Education provided Yes   Education Details Strengthening and stretching program initiated in seated position, as pt spends most time in w/c. PT educated pt on the importance of performing pressure relief every 30 minutes for 1-2 minutes to maintain skin integrity. PT demonstrated proper technique and pt returned demo. PT  educated pt on using rolled towel in L hand to prevent L finger flexion contractures.   Person(s) Educated Patient   Methods Explanation;Demonstration;Tactile cues;Verbal cues;Handout   Comprehension Returned demonstration;Verbalized understanding;Need further instruction          PT Short Term Goals - 11/11/15 1623    PT SHORT TERM GOAL #1   Title Pt will perform HEP with assist of caregiver to improve flexibility/spasticity, strength and balance. Target date: 10/15/15   Baseline Pt does not have a current HEP. ALL GOALS DUE AT END OF POC (11/23/15), AS PT LIMITED BY INSURANCE VISITS   Status On-going   PT SHORT TERM GOAL #2   Title Refer patient to orthotist to assess if patient appropriate candidate for bracing to ensure B ankle, and L knee stability during functional transfers.  Target date: 10/15/15.   Baseline Pt does not have a brace to assist in transfers or amb.   Status On-going   PT SHORT TERM GOAL #3   Title Pt will perform rolling in B directions with min A to improve functional mobility. Target date: 10/15/15   Baseline Pt requires mod A for R rolling.   Status On-going   PT SHORT TERM GOAL #4   Title Pt will perform squat pivot transfers (w/c<>mat) with max A to improve functional mobility.  Target date: 10/15/15   Baseline Pt performed squat pivot transfesrs with total A (+2 assist)   Status On-going           PT Long Term Goals - 11/11/15 1624    PT LONG TERM GOAL #1   Title Patient will perform squat pivot transfer from wheelchair <> mat table with mod A of single person to increase pt independence with funtional transfers and decrease physical burden on caregivers.  Target date: .11/12/15   Baseline Pt performed squat pivot transfesrs with total A (+2 assist)   Status On-going   PT LONG TERM GOAL #2   Title Pt will perform supine to/from sit with mod A to improve independence with bed mobility.  Target date: 11/12/15.   Baseline Pt currently requirea Max to Total A  bed mobility.   Status On-going   PT LONG TERM GOAL #3   Title Patient/caregivers will verbalize understanding of available equipment options and resources to maximize pt's current function as well as anticipated functional status.  Target date: 11/12/15.   Baseline Patient/caregivers currently dependent for information on available DME and resources appropriate for patient.   Status On-going   PT LONG TERM GOAL #4   Title Trial standing/amb. if appropriate and write goal. Target date: 11/12/15   Baseline Pt unable to attempt standing 2/2 weakness and spasticity.   Status On-going               Plan - 11/11/15 1610    Clinical Impression Statement PT performed re-eval, as pt had not been seen since 08/2015 due to waiting on insurance auth and transportation issues. Pt's MMT: RLE: hip flex: 2/5, knee ext: 3/5. knee flex: 2/5, ankle DF: 1/5, ankle PF: 2/5, hip abd/add: 2-/5; LLE: hip flex: 1/5, knee ext: 2+/5, knee flex: 0/5, ankle DF: 0/5, hip abd/add: 0/5, ankle PF: 1/5. Pt denied changes in sensations. R UE ROM WFL, L shoulder flexion ROM limited to approx. 80 degrees, PT able to obtain 180 degrees of L elbow ext during PROM. Pt required +2 assist (total  A) during sit to stand and Max A during stand to sit. Pt would continue to benefit from skilled PT to improve safety during functional mobility.    Rehab Potential Good   Clinical Impairments Affecting Rehab Potential Relies on family members for transportation; financial limitations   PT Frequency 2x / week  Pt limited to 1x/week for 3 weeks due to insurance limits   PT Duration 6 weeks   PT Treatment/Interventions ADLs/Self Care Home Management;Functional mobility training;DME Instruction;Neuromuscular re-education;Balance training;Therapeutic exercise;Therapeutic activities;Patient/family education;Orthotic Fit/Training;Energy conservation;Manual techniques;Wheelchair mobility training;Passive range of motion   PT Next Visit Plan .Add  to  home stretching/strengthening program. Consider standing frame for spasticity management.with transfers,  improve positioning, prevent contractures. Bed mobility.   PT Home Exercise Plan PT asked pt to bring brother to next session to assist in HEP and to bring RW   Consulted and Agree with Plan of Care Patient      Patient will benefit from skilled therapeutic intervention in order to improve the following deficits and impairments:  Decreased mobility, Decreased balance, Decreased strength, Impaired sensation, Postural dysfunction, Impaired UE functional use, Increased muscle spasms, Impaired tone, Abnormal gait, Decreased coordination, Decreased activity tolerance, Decreased endurance, Decreased knowledge of use of DME, Decreased range of motion, Hypomobility  Visit Diagnosis: Other abnormalities of gait and mobility - Plan: PT plan of care cert/re-cert  Muscle weakness (generalized) - Plan: PT plan of care cert/re-cert  Other lack of coordination - Plan: PT plan of care cert/re-cert     Problem List Patient Active Problem List   Diagnosis Date Noted  . Multiple sclerosis (HCC) 04/27/2015  . Gait disturbance 04/27/2015  . Urinary disorder 04/27/2015  . Other fatigue 04/27/2015  . Left hand weakness 04/27/2015  . Bilateral leg weakness 04/27/2015    Uchechi Denison L 11/11/2015, 4:33 PM  [facility] [address] [place], [place], [address] Phone: [phone]   Fax:  [phone]  Name: JAZLEE IHRIG MRN: [id number] Date of Birth: [birth year]    Zerita Boers, PT,DPT 11/11/2015 4:33 PM Phone: [phone] Fax: [phone]

## 2015-11-11 NOTE — Patient Instructions (Signed)
Gastroc, Sitting (Passive)    Sit with strap or towel around ball of foot. Gently pull toward body. Hold _30__ seconds.  Repeat _3__ times per session with each leg. Do _2-3__ sessions per day.  Copyright  VHI. All rights reserved.   Elbow: Flexion    Use other hand to bend elbow with thumb toward the same shoulder. Do NOT force this motion. Hold __30-45__ seconds. Repeat __3__ times. Do __2-3__ sessions per day. CAUTION: Movement should be gentle, steady and slow.  Copyright  VHI. All rights reserved.   Composite Extension (Passive Flexor Stretch)    Sitting with elbows on table and palms together, slowly lower wrists toward table until stretch is felt. Be sure to keep palms together throughout stretch. Hold _30-45___ seconds. Relax. Repeat _3___ times. Do _2-3___ sessions per day. OR you can try to slowly stretch left fingers with right hand. Copyright  VHI. All rights reserved.    KNEE: Extension, Long Arc Quad (Band)    Place band around ankles. Pull band forward until right  knee is straight. Hold _2__ seconds. Use ___yellow_____ band. __5_ reps per set, _3__ sets per day, _3__ days per week. Repeat with left leg, but without band.   Copyright  VHI. All rights reserved.

## 2015-11-19 ENCOUNTER — Ambulatory Visit: Payer: Medicaid Other

## 2015-11-23 ENCOUNTER — Ambulatory Visit: Payer: Medicaid Other | Attending: Internal Medicine | Admitting: Physical Therapy

## 2015-11-25 ENCOUNTER — Telehealth: Payer: Self-pay | Admitting: Neurology

## 2015-11-25 NOTE — Telephone Encounter (Signed)
I have spoken with Lindsay Schneider.  She needs to purchase a lightwt. transport w/c, but Medicare has already paid for an electric w/c in the last 5 yrs., so won't cover another w/c now.  I have given her the #'s for the [facility] [employer] [phone]), and the [facility] [phone]).  She can check with them to see if they have funding available that will help her cover the cost of a w/c. I have also given #'s for 2 dme companies--[facility] [phone]), and [facility] [phone]).  If she has to purchase the w/c herself, these companies may have refurbished chairs she can purchase at a discount./fim

## 2015-11-25 NOTE — Telephone Encounter (Signed)
Patient is calling to discuss getting a transport wheelchair.

## 2015-11-26 ENCOUNTER — Ambulatory Visit: Payer: Medicaid Other | Admitting: Physical Therapy

## 2015-12-01 ENCOUNTER — Encounter (HOSPITAL_COMMUNITY): Payer: Self-pay

## 2015-12-01 ENCOUNTER — Encounter (HOSPITAL_COMMUNITY)
Admission: RE | Admit: 2015-12-01 | Discharge: 2015-12-01 | Disposition: A | Discharge status: Home / Self Care | Payer: Medicaid Other | Source: Ambulatory Visit | Attending: Neurology | Admitting: Neurology

## 2015-12-01 DIAGNOSIS — G35 Multiple sclerosis: Secondary | ICD-10-CM | POA: Diagnosis present

## 2015-12-01 MED ORDER — SODIUM CHLORIDE 0.9 % IV SOLN
300.0000 mg | INTRAVENOUS | Status: DC
  Administered 2015-12-01: 300 mg via INTRAVENOUS
  Filled 2015-12-01: qty 15

## 2015-12-01 MED ORDER — SODIUM CHLORIDE 0.9 % IV SOLN
INTRAVENOUS | Status: DC
Start: 2015-12-01 — End: 2015-12-02
  Administered 2015-12-01: 09:00:00 via INTRAVENOUS

## 2015-12-01 NOTE — Discharge Instructions (Signed)
Tysabri °Natalizumab injection °What is this medicine? °NATALIZUMAB (na ta LIZ you mab) is used to treat relapsing multiple sclerosis. This drug is not a cure. It is also used to treat Crohn's disease. °This medicine may be used for other purposes; ask your health care provider or pharmacist if you have questions. °What should I tell my health care provider before I take this medicine? °They need to know if you have any of these conditions: °-immune system problems °-progressive multifocal leukoencephalopathy (PML) °-an unusual or allergic reaction to natalizumab, other medicines, foods, dyes, or preservatives °-pregnant or trying to get pregnant °-breast-feeding °How should I use this medicine? °This medicine is for infusion into a vein. It is given by a health care professional in a hospital or clinic setting. °A special MedGuide will be given to you by the pharmacist with each prescription and refill. Be sure to read this information carefully each time. °Talk to your pediatrician regarding the use of this medicine in children. This medicine is not approved for use in children. °Overdosage: If you think you have taken too much of this medicine contact a poison control center or emergency room at once. °NOTE: This medicine is only for you. Do not share this medicine with others. °What if I miss a dose? °It is important not to miss your dose. Call your doctor or health care professional if you are unable to keep an appointment. °What may interact with this medicine? °-azathioprine °-cyclosporine °-interferon °-6-mercaptopurine °-methotrexate °-steroid medicines like prednisone or cortisone °-TNF-alpha inhibitors like adalimumab, etanercept, and infliximab °-vaccines °This list may not describe all possible interactions. Give your health care provider a list of all the medicines, herbs, non-prescription drugs, or dietary supplements you use. Also tell them if you smoke, drink alcohol, or use illegal drugs. Some  items may interact with your medicine. °What should I watch for while using this medicine? °Your condition will be monitored carefully while you are receiving this medicine. Visit your doctor for regular check ups. Tell your doctor or healthcare professional if your symptoms do not start to get better or if they get worse. °Stay away from people who are sick. Call your doctor or health care professional for advice if you get a fever, chills or sore throat, or other symptoms of a cold or flu. Do not treat yourself. °In some patients, this medicine may cause a serious brain infection that may cause death. If you have any problems seeing, thinking, speaking, walking, or standing, tell your doctor right away. If you cannot reach your doctor, get urgent medical care. °What side effects may I notice from receiving this medicine? °Side effects that you should report to your doctor or health care professional as soon as possible: °-allergic reactions like skin rash, itching or hives, swelling of the face, lips, or tongue °-breathing problems °-changes in vision °-chest pain °-dark urine °-depression, feelings of sadness °-dizziness °-general ill feeling or flu-like symptoms °-irregular, missed, or painful menstrual periods °-light-colored stools °-loss of appetite, nausea °-muscle weakness °-problems with balance, talking, or walking °-right upper belly pain °-unusually weak or tired °-yellowing of the eyes or skin °Side effects that usually do not require medical attention (report to your doctor or health care professional if they continue or are bothersome): °-aches, pains °-headache °-stomach upset °-tiredness °This list may not describe all possible side effects. Call your doctor for medical advice about side effects. You may report side effects to FDA at 1-800-FDA-1088. °Where should I keep my medicine? °This   drug is given in a hospital or clinic and will not be stored at home. °NOTE: This sheet is a summary. It may  not cover all possible information. If you have questions about this medicine, talk to your doctor, pharmacist, or health care provider. °  °© 2016, Elsevier/[employer]. (2008-08-29 13:33:21) ° °

## 2015-12-01 NOTE — Progress Notes (Signed)
Patient stayed 30 min out of recommended one hour observation time. Aware to call MD with any problems. RN assisted patient to restroom via wheelchair and stand/pivot prior to d/c home. Home with cousin.

## 2015-12-06 ENCOUNTER — Telehealth: Payer: Self-pay | Admitting: Neurology

## 2015-12-06 NOTE — Telephone Encounter (Addendum)
Lindsay Schneider with [employer] [phone] is calling to see if the [facility] for she brought back last Friday is ready.  Please call.

## 2015-12-06 NOTE — Telephone Encounter (Signed)
I have spoken with Lindsay Schneider--she sts. pt. is requesting a home health aide from their company (sts. they are licensed thru [employer].)  She will send paperwork over/fim

## 2015-12-07 NOTE — Therapy (Signed)
[facility] [address] [place], [place], [address] Phone: [phone]   Fax:  [phone]  Patient Details  Name: Lindsay Schneider MRN: [id number] Date of Birth: 12/03/1985 Referring Provider:  No ref. provider found  Encounter Date: 12/07/2015  PHYSICAL THERAPY DISCHARGE SUMMARY  Visits from Start of Care: 2  Current functional level related to goals / functional outcomes:     PT Long Term Goals - 11/11/15 1624    PT LONG TERM GOAL #1   Title Patient will perform squat pivot transfer from wheelchair <> mat table with mod A of single person to increase pt independence with funtional transfers and decrease physical burden on caregivers.  Target date: .11/12/15   Baseline Pt performed squat pivot transfesrs with total A (+2 assist)   Status On-going   PT LONG TERM GOAL #2   Title Pt will perform supine to/from sit with mod A to improve independence with bed mobility.  Target date: 11/12/15.   Baseline Pt currently requirea Max to Total A bed mobility.   Status On-going   PT LONG TERM GOAL #3   Title Patient/caregivers will verbalize understanding of available equipment options and resources to maximize pt's current function as well as anticipated functional status.  Target date: 11/12/15.   Baseline Patient/caregivers currently dependent for information on available DME and resources appropriate for patient.   Status On-going   PT LONG TERM GOAL #4   Title Trial standing/amb. if appropriate and write goal. Target date: 11/12/15   Baseline Pt unable to attempt standing 2/2 weakness and spasticity.   Status On-going        Remaining deficits: Unknown, as pt has not returned since last visit.   Education / Equipment: HEP  Plan: Patient agrees to discharge.  Patient goals were not met. Patient is being discharged due to not returning since the last visit.  ?????       Tychelle Purkey L 12/07/2015, 11:37 AM  [facility] [address] [place], [place], [address] Phone: [phone]   Fax:  [phone]   Geoffry Paradise, PT,DPT 12/07/2015 11:37 AM Phone: [phone] Fax: [phone]

## 2015-12-08 NOTE — Telephone Encounter (Signed)
Lindsay Schneider called inquiring if paperwork was rec'd by RN. Please call.

## 2015-12-09 NOTE — Telephone Encounter (Signed)
I have spoken with [facility] and advised forms are ready to be picked up.  They are up front [facility]/fim

## 2015-12-24 ENCOUNTER — Encounter: Payer: Self-pay | Admitting: Neurology

## 2015-12-24 ENCOUNTER — Ambulatory Visit (INDEPENDENT_AMBULATORY_CARE_PROVIDER_SITE_OTHER): Payer: Medicaid Other | Admitting: Neurology

## 2015-12-24 VITALS — BP 124/72 | HR 68 | Resp 16 | Ht 63.0 in | Wt 183.0 lb

## 2015-12-24 DIAGNOSIS — N399 Disorder of urinary system, unspecified: Secondary | ICD-10-CM | POA: Diagnosis not present

## 2015-12-24 DIAGNOSIS — R29898 Other symptoms and signs involving the musculoskeletal system: Secondary | ICD-10-CM

## 2015-12-24 DIAGNOSIS — R269 Unspecified abnormalities of gait and mobility: Secondary | ICD-10-CM

## 2015-12-24 DIAGNOSIS — R5383 Other fatigue: Secondary | ICD-10-CM

## 2015-12-24 DIAGNOSIS — G35 Multiple sclerosis: Secondary | ICD-10-CM

## 2015-12-24 MED ORDER — DIAZEPAM 5 MG PO TABS
5.0000 mg | ORAL_TABLET | Freq: Three times a day (TID) | ORAL | Status: DC | PRN
Start: 2015-12-24 — End: 2017-08-28

## 2015-12-24 MED ORDER — PHENTERMINE HCL 37.5 MG PO CAPS: 37.5000 mg | ORAL_CAPSULE | ORAL | Status: DC

## 2015-12-24 NOTE — Progress Notes (Signed)
[facility]  PATIENT: Lindsay Schneider DOB: 1986/01/22  REFERRING DOCTOR OR PCP:   SOURCE:   _________________________________   HISTORICAL  CHIEF COMPLAINT:  Chief Complaint  Patient presents with  . Multiple Sclerosis    Sts. she continues to tolerate Tysabri well.  JCV ab last checked 08-31-15 and was negative at 0.15.  Sts. she continues to have more spasticity in bilat legs and left arm/hand/fim    HISTORY OF PRESENT ILLNESS:  Lindsay Schneider is a 30 year old woman who was diagnosed with MS in 2011 after presenting with left sided arm and leg numbness and weakness.   She is on Tysabri. Although she has had no definite exacerbation. She does feel a little weaker and is noting more spasticity on the left    She tolerates Tysabri well. JCV antibody was about 6 months ago. Her last infusion was 11/26/2015.    Her next infusion is 12/30/2015.   She has been on Tysabri for about one year but had been on it previously as well.  Gait/strength/sensation: Her main problem is poor gait and poor use of the left arm.   She is unable to use a walker for more than a few steps. She needs to be wheeled from one room to another.   She has an Mining engineer wheelchair but needs help to get in and out of wheelchair.    She has difficulty getting around the house to do her activities of daily living such as getting to the bathroom, kitchen or other rooms of the house.    She cannot walk with a cane or walker.    She uses a lightweight wheelchair inside and outside the house.     Her left arm is spastic and weaker than the right    She takes baclofen 90 mg a day and tizanidine once a day (has trouble tolerating).    She has a lot of spasticity in both legs and the left arm. The legs have spasms that take a minute to break, especially when she is being moved.. This often happens when transferring.   .  Bladder:   She has difficulty with her bladder. She has urinary urgency and frequency and will  have occasional incontinence if she cannot get to the bathroom and transfer in time.  Oxybutynin has helped and she has fewer episodes of incontinence now.     Vision:   She denies any difficulty with her vision.  IVision is blurry in the mornings.   .    Fatigue:   She notes difficulty with fatigue. Fatigue is mostly physical.   She feels she gets winded easily. This is much worse when she tires herself out moving the wheelchair or she is hot.   She is sleeping better on tizanidine/baclofen combo.    Mood/cognition:   She notes depression but no anxiety. She cries when frustrated and sometimes spontaneously.   She denies any major difficulties with cognitive function. She sometimes has difficulty focusing.  MS History:  In 2011, onset of left arm and leg numbness and weakness.  Her symptoms develop over 1 day. She had an MRI performed with lesions consistent with MS. She started to see Dr. Terrace Arabia here at [facility]. She was placed on Rebif. However, she stopped because her legs felt weaker when she was on it. She then transferred her care to Dr. Renne Crigler at [facility].     She was started on Tysabri.  She had several infusions of Tysabri but transportation  was difficult to get to [facility]. Therefore, Dr. Renne Crigler switched her to Rituxan although she tolerated the medication well. She had a significant relapse with bilateral leg weakness within a couple weeks of the infusion. Therefore, 6 months later, she started Tysabri again. Due to difficulties with transportation, she transferred care to [place] in October 2016.   REVIEW OF SYSTEMS: Constitutional: No fevers, chills, sweats, or change in appetite.   She has fatigue and poor sleep.    Eyes: No visual changes, double vision, eye pain Ear, nose and throat: No hearing loss, ear pain, nasal congestion, sore throat Cardiovascular: No chest pain, palpitations Respiratory: No shortness of breath at rest or with exertion.   No wheezes GastrointestinaI: No nausea, vomiting,  diarrhea, abdominal pain, fecal incontinence Genitourinary: Shehas urinary urgency and frequency with occ incontinence.    Hasnocturia. Musculoskeletal: No neck pain, back pain Integumentary: No rash, pruritus, skin lesions Neurological: as above Psychiatric:   Some depression at this time.  No anxiety Endocrine: No palpitations, diaphoresis, change in appetite, change in weigh or increased thirst Hematologic/Lymphatic: No anemia, purpura, petechiae. Allergic/Immunologic: No itchy/runny eyes, nasal congestion, recent allergic reactions, rashes  ALLERGIES: No Known Allergies  HOME MEDICATIONS:  Current outpatient prescriptions:  .  baclofen (LIORESAL) 10 MG tablet, Take 2 tablets 3 times daily for MS related spasticity., Disp: 270 each, Rfl: 11 .  Dalfampridine (AMPYRA PO), Take 10 mg by mouth. , Disp: , Rfl:  .  medroxyPROGESTERone (DEPO-PROVERA) 150 MG/ML injection, Inject 150 mg into the muscle every 3 (three) months. Reported on 09/17/2015, Disp: , Rfl:  .  natalizumab (TYSABRI) 300 MG/15ML injection, Inject 300 mg into the vein every 30 (thirty) days., Disp: , Rfl:  .  oxybutynin (DITROPAN) 5 MG tablet, Take 1 tablet (5 mg total) by mouth 3 (three) times daily., Disp: 90 tablet, Rfl: 11 .  tiZANidine (ZANAFLEX) 4 MG tablet, Take 1 tablet (4 mg total) by mouth 3 (three) times daily., Disp: 90 tablet, Rfl: 11  PAST MEDICAL HISTORY: Past Medical History  Diagnosis Date  . MS (multiple sclerosis) (HCC)   . Movement disorder     PAST SURGICAL HISTORY: Past Surgical History  Procedure Laterality Date  . Cesarean section      FAMILY HISTORY: Family History  Problem Relation Age of Onset  . Asthma Mother   . Hypertension Father   . Healthy Sister   . Healthy Brother     SOCIAL HISTORY:  Social History   Social History  . Marital Status: Single    Spouse Name: N/A  . Number of Children: N/A  . Years of Education: N/A   Occupational History  . Not on file.    Social History Main Topics  . Smoking status: Never Smoker   . Smokeless tobacco: Not on file  . Alcohol Use: No  . Drug Use: No  . Sexual Activity: Not Currently    Birth Control/ Protection: Injection   Other Topics Concern  . Not on file   Social History Narrative     PHYSICAL EXAM  Filed Vitals:   12/24/15 0932  BP: 124/72  Pulse: 68  Resp: 16  Height: 5\' 3"  (1.6 m)  Weight: 183 lb (83.008 kg)    Body mass index is 32.43 kg/(m^2).   General: The patient is well-developed and well-nourished and in no acute distress  Skin: Extremities are without significant edema.  Musculoskeletal:  Neck has good range of motion and is nontender.    Neurologic Exam  Mental status: The patient is alert and oriented x 3 at the time of the examination. The patient has apparent normal recent and remote memory, with an apparently normal attention span and concentration ability.   Speech is normal.  Cranial nerves: Extraocular movements are full.. There is good facial sensation to soft touch bilaterally.Facial strength is normal.  Trapezius and sternocleidomastoid strength is normal. No dysarthria is noted.  The tongue is midline, and the patient has symmetric elevation of the soft palate. No obvious hearing deficits are noted.  Motor:  Muscle bulk is inormal and tone is greatly ncreased in her legs (L>R) and the left arm.   Right arm with good tone/strength.   Strength (R/L) is  2+ / 2- in hip flexors, 4-/3 knee extensors, 2+/1 ankle extensors, 2+ /5 proximal and 3 to 4-/5 distal muscles of left arm.     Sensory: Sensory testing is intact to pinprick, soft touch and vibration sensation in all 4 extremities except left foot with reduced vibration  Coordination: Cerebellar testing reveals poor left finger-nose-finger and she can't do heel-to-shin bilaterally.  Gait and station: She needs strong bilat support to stand and cannot walk even with assistance.      Reflexes: Deep tendon  reflexes are increased in all limbs, worse in legs (clonus at ankles, spread at knees) and '3+ in left arm.      DIAGNOSTIC DATA (LABS, IMAGING, TESTING) - I reviewed patient records, labs, notes, testing and imaging myself where available.  Lab Results  Component Value Date   WBC 9.1 08/30/2015   HGB 12.9 03/05/2012   HCT 34.6 08/30/2015   MCV 83 08/30/2015   PLT 217 08/30/2015      Component Value Date/Time   NA 138 03/05/2012 1805   K 3.6 03/05/2012 1805   CL 105 03/05/2012 1805   CO2 21 03/05/2012 1805   GLUCOSE 96 03/05/2012 1805   BUN 7 03/05/2012 1805   CREATININE 0.58 03/05/2012 1805   CALCIUM 9.6 03/05/2012 1805   PROT 7.6 03/05/2012 1805   ALBUMIN 4.7 03/05/2012 1805   AST 14 03/05/2012 1805   ALT 9 03/05/2012 1805   ALKPHOS 76 03/05/2012 1805   BILITOT 0.7 03/05/2012 1805   GFRNONAA >90 03/05/2012 1805   GFRAA >90 03/05/2012 1805       ASSESSMENT AND PLAN  Multiple sclerosis (HCC)  Gait disturbance  Other fatigue  Urinary disorder  Bilateral leg weakness    1.    Continue baclofen and add valium for spasticity. She will be getting Medicare soon and I will have her get some physical therapy.  2.   Continue Tysabri for the multiple sclerosis.     3.   Continue Oxybutynin 5 mg tid for urinary symptoms.    4.   Phentermine for weight loss and as a mild stimulant may help focus/attnetion.   5.   Jury duty letter Return to see me in 4 months or sooner if there are new or worsening neurologic symptoms.    Kostas Marrow A. Epimenio Foot, MD, PhD 12/24/2015, 9:33 AM Certified in Neurology, Clinical Neurophysiology, Sleep Medicine, Pain Medicine and Neuroimaging  [facility] [address], [address] [place], [place] [address] [phone]

## 2015-12-27 ENCOUNTER — Telehealth: Payer: Self-pay | Admitting: Neurology

## 2015-12-27 NOTE — Telephone Encounter (Signed)
Tanishua/[facility] [phone] called regarding form for home health services that she personally dropped off to our office Friday 12/24/15, states patient had appointment with [name] that day and wants to know if [name] has  signed the form yet?

## 2015-12-27 NOTE — Telephone Encounter (Signed)
Paperwork was received Friday 12-24-15.  Will call [facility] as soon as it is complete/fim

## 2015-12-28 NOTE — Telephone Encounter (Signed)
[employer] just called and stated someone just called them and stated the paperwork is ready.  She is coming by to pick up.

## 2015-12-28 NOTE — Telephone Encounter (Signed)
Noted/fim 

## 2015-12-30 ENCOUNTER — Encounter (HOSPITAL_COMMUNITY)
Admission: RE | Admit: 2015-12-30 | Payer: Medicaid Other | Source: Ambulatory Visit | Attending: Neurology | Admitting: Neurology

## 2016-01-11 ENCOUNTER — Encounter (HOSPITAL_COMMUNITY)
Admission: RE | Admit: 2016-01-11 | Discharge: 2016-01-11 | Disposition: A | Discharge status: Home / Self Care | Payer: Medicaid Other | Source: Ambulatory Visit | Attending: Neurology | Admitting: Neurology

## 2016-01-11 ENCOUNTER — Encounter (HOSPITAL_COMMUNITY): Payer: Self-pay

## 2016-01-11 DIAGNOSIS — G35 Multiple sclerosis: Secondary | ICD-10-CM | POA: Diagnosis not present

## 2016-01-11 MED ORDER — SODIUM CHLORIDE 0.9 % IV SOLN
300.0000 mg | INTRAVENOUS | Status: DC
  Administered 2016-01-11: 300 mg via INTRAVENOUS
  Filled 2016-01-11: qty 15

## 2016-01-11 MED ORDER — SODIUM CHLORIDE 0.9 % IV SOLN
INTRAVENOUS | Status: DC
  Administered 2016-01-11: 10:00:00 via INTRAVENOUS

## 2016-01-11 NOTE — Progress Notes (Signed)
Post-infusion Tysabri, pt. Stayed 30 minutes post-infusion, pt. To follow-up with doctor with any problems.

## 2016-01-11 NOTE — Discharge Instructions (Signed)

## 2016-01-24 ENCOUNTER — Encounter: Payer: Self-pay | Admitting: *Deleted

## 2016-01-27 ENCOUNTER — Encounter (HOSPITAL_COMMUNITY): Payer: Medicaid Other

## 2016-02-09 ENCOUNTER — Encounter (HOSPITAL_COMMUNITY)
Admission: RE | Admit: 2016-02-09 | Discharge: 2016-02-09 | Disposition: A | Discharge status: Home / Self Care | Payer: Medicaid Other | Source: Ambulatory Visit | Attending: Neurology | Admitting: Neurology

## 2016-02-09 DIAGNOSIS — G35 Multiple sclerosis: Secondary | ICD-10-CM | POA: Insufficient documentation

## 2016-02-16 ENCOUNTER — Encounter (HOSPITAL_COMMUNITY): Admission: RE | Admit: 2016-02-16 | Payer: Medicaid Other | Source: Ambulatory Visit

## 2016-02-28 ENCOUNTER — Ambulatory Visit: Payer: Medicaid Other | Admitting: Neurology

## 2016-03-08 ENCOUNTER — Encounter (HOSPITAL_COMMUNITY): Payer: Medicaid Other

## 2016-03-16 ENCOUNTER — Encounter (HOSPITAL_COMMUNITY)
Admission: RE | Admit: 2016-03-16 | Discharge: 2016-03-16 | Disposition: A | Discharge status: Home / Self Care | Payer: Medicaid Other | Source: Ambulatory Visit | Attending: Neurology | Admitting: Neurology

## 2016-03-16 DIAGNOSIS — G35 Multiple sclerosis: Secondary | ICD-10-CM | POA: Insufficient documentation

## 2016-03-16 MED ORDER — SODIUM CHLORIDE 0.9 % IV SOLN
INTRAVENOUS | Status: DC
  Administered 2016-03-16: 12:00:00 via INTRAVENOUS

## 2016-03-16 MED ORDER — SODIUM CHLORIDE 0.9 % IV SOLN
300.0000 mg | INTRAVENOUS | Status: DC
  Administered 2016-03-16: 300 mg via INTRAVENOUS
  Filled 2016-03-16: qty 15

## 2016-03-23 ENCOUNTER — Encounter (HOSPITAL_COMMUNITY): Payer: Medicaid Other

## 2016-04-05 ENCOUNTER — Encounter (HOSPITAL_COMMUNITY): Payer: Medicaid Other

## 2016-04-06 ENCOUNTER — Telehealth: Payer: Self-pay | Admitting: Neurology

## 2016-04-06 NOTE — Telephone Encounter (Signed)
Patient called regarding "having a lot of joint pain due to the weather", requests [name] prescribe something for her, "has been taking OTC medicines and they're not working. Hands, feet and legs are in a lot pain for the past several days, can't sleep." Please call [phone].

## 2016-04-06 NOTE — Telephone Encounter (Signed)
I have spoken with Raquel SarnaSherika this afternoon.  She c/o worsening generalized pain, not helped by current meds.  Last seen in June.  Appt. given 05-11-16 to discuss with RAS.Chucky May/fim

## 2016-04-11 ENCOUNTER — Ambulatory Visit: Payer: Self-pay | Admitting: Neurology

## 2016-04-17 ENCOUNTER — Encounter: Payer: Self-pay | Admitting: Neurology

## 2016-04-18 ENCOUNTER — Encounter (HOSPITAL_COMMUNITY): Admission: RE | Admit: 2016-04-18 | Payer: Medicaid Other | Source: Ambulatory Visit

## 2016-04-24 ENCOUNTER — Encounter (HOSPITAL_COMMUNITY): Admission: RE | Admit: 2016-04-24 | Payer: Medicaid Other | Source: Ambulatory Visit

## 2016-05-08 ENCOUNTER — Other Ambulatory Visit (HOSPITAL_COMMUNITY): Payer: Self-pay | Admitting: Neurology

## 2016-05-16 ENCOUNTER — Encounter (HOSPITAL_COMMUNITY): Payer: Medicaid Other

## 2016-05-22 ENCOUNTER — Ambulatory Visit: Payer: Medicaid Other | Admitting: Neurology

## 2016-05-23 ENCOUNTER — Encounter (HOSPITAL_COMMUNITY)
Admission: RE | Admit: 2016-05-23 | Discharge: 2016-05-23 | Disposition: A | Discharge status: Home / Self Care | Payer: Medicaid Other | Source: Ambulatory Visit | Attending: Neurology | Admitting: Neurology

## 2016-05-23 DIAGNOSIS — G35 Multiple sclerosis: Secondary | ICD-10-CM | POA: Insufficient documentation

## 2016-05-25 ENCOUNTER — Encounter: Payer: Self-pay | Admitting: Neurology

## 2016-06-13 ENCOUNTER — Encounter (HOSPITAL_COMMUNITY): Payer: Medicaid Other

## 2016-06-20 ENCOUNTER — Encounter (HOSPITAL_COMMUNITY): Payer: Self-pay

## 2016-06-20 ENCOUNTER — Encounter (HOSPITAL_COMMUNITY)
Admission: RE | Admit: 2016-06-20 | Discharge: 2016-06-20 | Disposition: A | Discharge status: Home / Self Care | Payer: Medicaid Other | Source: Ambulatory Visit | Attending: Neurology | Admitting: Neurology

## 2016-06-20 DIAGNOSIS — G35 Multiple sclerosis: Secondary | ICD-10-CM | POA: Insufficient documentation

## 2016-06-20 MED ORDER — SODIUM CHLORIDE 0.9 % IV SOLN
300.0000 mg | INTRAVENOUS | Status: DC
  Administered 2016-06-20: 300 mg via INTRAVENOUS
  Filled 2016-06-20: qty 15

## 2016-06-20 MED ORDER — SODIUM CHLORIDE 0.9 % IV SOLN
INTRAVENOUS | Status: DC
  Administered 2016-06-20: 13:00:00 via INTRAVENOUS

## 2016-06-20 NOTE — Progress Notes (Signed)
Post- infusion Tysabri, pt. Stayed 30 minutes, pt. To follow-up with doctor with any problems.

## 2016-06-26 ENCOUNTER — Other Ambulatory Visit: Payer: Self-pay | Admitting: Neurology

## 2016-06-27 ENCOUNTER — Ambulatory Visit: Payer: Medicaid Other | Admitting: Neurology

## 2016-06-27 ENCOUNTER — Other Ambulatory Visit: Payer: Self-pay | Admitting: *Deleted

## 2016-07-25 ENCOUNTER — Encounter (HOSPITAL_COMMUNITY): Admission: RE | Admit: 2016-07-25 | Payer: Medicaid Other | Source: Ambulatory Visit

## 2016-07-26 ENCOUNTER — Encounter: Payer: Self-pay | Admitting: *Deleted

## 2016-08-03 ENCOUNTER — Ambulatory Visit: Payer: Medicaid Other | Admitting: Neurology

## 2016-08-30 ENCOUNTER — Ambulatory Visit (INDEPENDENT_AMBULATORY_CARE_PROVIDER_SITE_OTHER): Payer: Medicaid Other | Admitting: Neurology

## 2016-08-30 ENCOUNTER — Encounter: Payer: Self-pay | Admitting: Neurology

## 2016-08-30 VITALS — BP 124/68 | HR 66 | Resp 14 | Ht 63.0 in | Wt 176.0 lb

## 2016-08-30 DIAGNOSIS — G35 Multiple sclerosis: Secondary | ICD-10-CM | POA: Diagnosis not present

## 2016-08-30 DIAGNOSIS — R29898 Other symptoms and signs involving the musculoskeletal system: Secondary | ICD-10-CM | POA: Diagnosis not present

## 2016-08-30 DIAGNOSIS — R5383 Other fatigue: Secondary | ICD-10-CM

## 2016-08-30 DIAGNOSIS — M25552 Pain in left hip: Secondary | ICD-10-CM

## 2016-08-30 DIAGNOSIS — R269 Unspecified abnormalities of gait and mobility: Secondary | ICD-10-CM | POA: Diagnosis not present

## 2016-08-30 DIAGNOSIS — N399 Disorder of urinary system, unspecified: Secondary | ICD-10-CM

## 2016-08-30 MED ORDER — PHENTERMINE HCL 37.5 MG PO CAPS: 37.5000 mg | ORAL_CAPSULE | ORAL | 5 refills | Status: DC

## 2016-08-30 MED ORDER — MELOXICAM 7.5 MG PO TABS: 7.5000 mg | ORAL_TABLET | Freq: Every day | ORAL | 5 refills | Status: DC

## 2016-08-30 NOTE — Progress Notes (Signed)
[facility]  PATIENT: Lindsay Schneider DOB: 04-03-1986  REFERRING DOCTOR OR PCP:   SOURCE:   _________________________________   HISTORICAL  CHIEF COMPLAINT:  Chief Complaint  Patient presents with  . Multiple Sclerosis    Sts. she continues to tolerate Tysabri well.  JCV ab last checked 08-30-15 and was negative at 0.15  She c/o more bilat hip pain, and pain behind knees, more muscle spasms bilat legs/fim    HISTORY OF PRESENT ILLNESS:  Lindsay Schneider is a 31 year old woman who was diagnosed with MS in 2011 after presenting with left sided arm and leg numbness and weakness.     MS:   She is on Tysabri. Her JCV Ab titers have been negative.   She tolerates Tysabri well.  She has had no definite exacerbation.       She has been on Tysabri for about 18 months but had been on it previously as well.  Gait/strength/sensation: She has left > right weakness, leg > arm.    She cannot walk and needs help to transfer.     She has difficulty getting around the house to do her activities of daily living such as getting to the bathroom, kitchen or other rooms of the house.   Both legs weak and have spasticity (worse on left)     Her left arm is spastic and weaker than the right  Right hand still shakes some when she tries to write,   She takes baclofen 90 mg a day and tizanidine once a day (has trouble tolerating). They help some.     The legs sometimes have spasms that take a minute to break, especially when she is being moved..After a minute, the spasm goes away.   Bladder:   She has difficulty with her bladder. She has urinary urgency and frequency and will have occasional incontinence if she cannot get to the bathroom and transfer in time.  Oxybutynin did not help any and she stoppef.     Vision:   She denies any difficulty with her vision.  IVision is blurry in the mornings.   .    Fatigue:   She notes difficulty with fatigue. Fatigue is mostly physical.  Fatigue is better  with Phentermine.    She is sleeping ok most nights on tizanidine/baclofen combo.    Mood/cognition:   She feels mood is better with less depression andno anxiety.   She denies any major difficulties with cognitive function. She has difficulty focusing but does better with phentermine.  Left hip pain:   She has more pain in the left hip.    MS History:  In 2011, onset of left arm and leg numbness and weakness.  Her symptoms develop over 1 day. She had an MRI performed with lesions consistent with MS. She started to see Dr. Terrace Arabia here at [facility]. She was placed on Rebif. However, she stopped because her legs felt weaker when she was on it. She then transferred her care to Dr. Renne Crigler at [facility].     She was started on Tysabri.  She had several infusions of Tysabri but transportation was difficult to get to [facility]. Therefore, Dr. Renne Crigler switched her to Rituxan although she tolerated the medication well. She had a significant relapse with bilateral leg weakness within a couple weeks of the infusion. Therefore, 6 months later, she started Tysabri again. Due to difficulties with transportation, she transferred care to [place] in October 2016.   REVIEW OF SYSTEMS: Constitutional: No  fevers, chills, sweats, or change in appetite.   She has fatigue and poor sleep.    Eyes: No visual changes, double vision, eye pain Ear, nose and throat: No hearing loss, ear pain, nasal congestion, sore throat Cardiovascular: No chest pain, palpitations Respiratory: No shortness of breath at rest or with exertion.   No wheezes GastrointestinaI: No nausea, vomiting, diarrhea, abdominal pain, fecal incontinence Genitourinary: Shehas urinary urgency and frequency with occ incontinence.    Hasnocturia. Musculoskeletal: No neck pain, back pain Integumentary: No rash, pruritus, skin lesions Neurological: as above Psychiatric:   Some depression at this time.  No anxiety Endocrine: No palpitations, diaphoresis, change in appetite,  change in weigh or increased thirst Hematologic/Lymphatic: No anemia, purpura, petechiae. Allergic/Immunologic: No itchy/runny eyes, nasal congestion, recent allergic reactions, rashes  ALLERGIES: No Known Allergies  HOME MEDICATIONS:  Current Outpatient Prescriptions:  .  baclofen (LIORESAL) 10 MG tablet, Take 2 tablets 3 times daily for MS related spasticity., Disp: 270 each, Rfl: 11 .  Dalfampridine (AMPYRA PO), Take 10 mg by mouth. , Disp: , Rfl:  .  natalizumab (TYSABRI) 300 MG/15ML injection, Inject 300 mg into the vein every 30 (thirty) days., Disp: , Rfl:  .  oxybutynin (DITROPAN) 5 MG tablet, Take 1 tablet (5 mg total) by mouth 3 (three) times daily., Disp: 90 tablet, Rfl: 11 .  tiZANidine (ZANAFLEX) 4 MG tablet, Take 1 tablet (4 mg total) by mouth 3 (three) times daily., Disp: 90 tablet, Rfl: 11 .  diazepam (VALIUM) 5 MG tablet, Take 1 tablet (5 mg total) by mouth every 8 (eight) hours as needed for anxiety. (Patient not taking: Reported on 08/30/2016), Disp: 90 tablet, Rfl: 4 .  meloxicam (MOBIC) 7.5 MG tablet, Take 1 tablet (7.5 mg total) by mouth daily., Disp: 30 tablet, Rfl: 5 .  phentermine 37.5 MG capsule, Take 1 capsule (37.5 mg total) by mouth every morning., Disp: 30 capsule, Rfl: 5  PAST MEDICAL HISTORY: Past Medical History:  Diagnosis Date  . Movement disorder   . MS (multiple sclerosis) (HCC)     PAST SURGICAL HISTORY: Past Surgical History:  Procedure Laterality Date  . CESAREAN SECTION      FAMILY HISTORY: Family History  Problem Relation Age of Onset  . Asthma Mother   . Hypertension Father   . Healthy Sister   . Healthy Brother     SOCIAL HISTORY:  Social History   Social History  . Marital status: Single    Spouse name: N/A  . Number of children: N/A  . Years of education: N/A   Occupational History  . Not on file.   Social History Main Topics  . Smoking status: Never Smoker  . Smokeless tobacco: Never Used  . Alcohol use No  . Drug  use: No  . Sexual activity: Not Currently    Birth control/ protection: Injection   Other Topics Concern  . Not on file   Social History Narrative  . No narrative on file     PHYSICAL EXAM  Vitals:   08/30/16 1300  BP: 124/68  Pulse: 66  Resp: 14  Weight: 176 lb (79.8 kg)  Height: 5\' 3"  (1.6 m)    Body mass index is 31.18 kg/m.   General: The patient is well-developed and well-nourished and in no acute distress  Skin: Extremities are without rash or edema.  Neurologic Exam  Mental status: The patient is alert and oriented x 3 at the time of the examination. The patient has apparent normal  recent and remote memory, with an apparently normal attention span and concentration ability.   Speech is normal.  Cranial nerves: Extraocular movements are full.. There is normal facial sensation and strength.   Trapezius and sternocleidomastoid strength is normal. No dysarthria is noted.  The tongue is midline, and the patient has symmetric elevation of the soft palate. No obvious hearing deficits are noted.  Motor:  Muscle bulk is inormal and tone is greatly increased in her legs (L>R) and the left arm.   Right arm with good tone/strength.  Strength is 3 to 4-/5 in the left distal arm and 2-3 in the proximal arm. Strength (R/L) is  2+ / 2- in hip flexors, 4-/3 knee extensors, 2+/1 ankle extensors,    Sensory: Sensory testing is intact to touch and vibration sensation in the arms and legs except for the left foot that has reduced vibration sensation.   Coordination: Cerebellar testing reveals poor left finger-nose-finger and she can't do heel-to-shin bilaterally.  Gait and station: She needs strong bilat support to stand and cannot walk even with assistance.      Reflexes: Deep tendon reflexes are increased in all limbs, worse in legs (clonus at ankles, spread at knees) and '3+ in left arm.      DIAGNOSTIC DATA (LABS, IMAGING, TESTING) - I reviewed patient records, labs, notes,  testing and imaging myself where available.  Lab Results  Component Value Date   WBC 9.1 08/30/2015   HGB 12.9 03/05/2012   HCT 34.6 08/30/2015   MCV 83 08/30/2015   PLT 217 08/30/2015      Component Value Date/Time   NA 138 03/05/2012 1805   K 3.6 03/05/2012 1805   CL 105 03/05/2012 1805   CO2 21 03/05/2012 1805   GLUCOSE 96 03/05/2012 1805   BUN 7 03/05/2012 1805   CREATININE 0.58 03/05/2012 1805   CALCIUM 9.6 03/05/2012 1805   PROT 7.6 03/05/2012 1805   ALBUMIN 4.7 03/05/2012 1805   AST 14 03/05/2012 1805   ALT 9 03/05/2012 1805   ALKPHOS 76 03/05/2012 1805   BILITOT 0.7 03/05/2012 1805   GFRNONAA >90 03/05/2012 1805   GFRAA >90 03/05/2012 1805       ASSESSMENT AND PLAN  Multiple sclerosis (HCC) - Plan: Stratify JCV Antibody Test ([facility]), CBC with Differential/Platelet, Ambulatory referral to Physical Therapy  Bilateral leg weakness - Plan: Ambulatory referral to Physical Therapy  Gait disturbance  Urinary disorder  Other fatigue  Left hand weakness  Left hip pain - Plan: Ambulatory referral to Physical Therapy   1.    Continue Tysabri for the multiple sclerosis.   Recheck JCV Ab  .  Later in the year we will check an MRI of the brain to determine if there has been any subclinical progression 2.    Try to stay active and exercises as tolerated. Home physical therapy. 3.    Continue Phentermine for fatigue, weight loss, attnetion 4.    Return in 6 months or sooner if there are new or worsening neurologic symptoms.    Richard A. Epimenio Foot, MD, PhD 08/30/2016, 4:33 PM Certified in Neurology, Clinical Neurophysiology, Sleep Medicine, Pain Medicine and Neuroimaging  [facility] [address], [address] [place], [place] [address] [phone]

## 2016-08-31 LAB — CBC WITH DIFFERENTIAL/PLATELET
Basophils Absolute: 0 10*3/uL (ref 0.0–0.2)
Basos: 0 %
EOS (ABSOLUTE): 0 10*3/uL (ref 0.0–0.4)
Eos: 0 %
Hematocrit: 35.9 % (ref 34.0–46.6)
Hemoglobin: 11.8 g/dL (ref 11.1–15.9)
IMMATURE GRANULOCYTES: 0 %
Immature Grans (Abs): 0 10*3/uL (ref 0.0–0.1)
Lymphocytes Absolute: 1.9 10*3/uL (ref 0.7–3.1)
Lymphs: 23 %
MCH: 26.6 pg (ref 26.6–33.0)
MCHC: 32.9 g/dL (ref 31.5–35.7)
MCV: 81 fL (ref 79–97)
MONOS ABS: 0.6 10*3/uL (ref 0.1–0.9)
Monocytes: 7 %
NEUTROS PCT: 70 %
Neutrophils Absolute: 5.9 10*3/uL (ref 1.4–7.0)
PLATELETS: 226 10*3/uL (ref 150–379)
RBC: 4.43 x10E6/uL (ref 3.77–5.28)
RDW: 14.7 % (ref 12.3–15.4)
WBC: 8.4 10*3/uL (ref 3.4–10.8)

## 2016-09-04 ENCOUNTER — Telehealth: Payer: Self-pay | Admitting: Neurology

## 2016-09-04 NOTE — Telephone Encounter (Signed)
Spoke to [employer] and based on DX patient does not qualify for Therapy. Please advise if you want something else [name].

## 2016-09-04 NOTE — Telephone Encounter (Signed)
Faith I will find another company that will take her insurance. Its her insurance . I will keep you updated. Thanks Annabelle Harman

## 2016-09-05 NOTE — Telephone Encounter (Signed)
Noted/fim 

## 2016-09-08 ENCOUNTER — Other Ambulatory Visit: Payer: Self-pay | Admitting: Neurology

## 2016-09-08 ENCOUNTER — Encounter: Payer: Self-pay | Admitting: *Deleted

## 2016-09-11 ENCOUNTER — Ambulatory Visit (HOSPITAL_COMMUNITY)
Admission: RE | Admit: 2016-09-11 | Discharge: 2016-09-11 | Disposition: A | Discharge status: Home / Self Care | Payer: Medicaid Other | Source: Ambulatory Visit | Attending: Neurology | Admitting: Neurology

## 2016-09-11 ENCOUNTER — Encounter (HOSPITAL_COMMUNITY): Payer: Self-pay

## 2016-09-11 DIAGNOSIS — G35 Multiple sclerosis: Secondary | ICD-10-CM | POA: Diagnosis present

## 2016-09-11 MED ORDER — SODIUM CHLORIDE 0.9 % IV SOLN
INTRAVENOUS | Status: DC
  Administered 2016-09-11: 14:00:00 via INTRAVENOUS

## 2016-09-11 MED ORDER — SODIUM CHLORIDE 0.9 % IV SOLN
300.0000 mg | INTRAVENOUS | Status: DC
  Administered 2016-09-11: 300 mg via INTRAVENOUS
  Filled 2016-09-11: qty 15

## 2016-09-11 NOTE — Discharge Instructions (Signed)
°Tysabri  °Natalizumab injection °What is this medicine? °NATALIZUMAB (na ta LIZ you mab) is used to treat relapsing multiple sclerosis. This drug is not a cure. It is also used to treat Crohn's disease. °This medicine may be used for other purposes; ask your health care provider or pharmacist if you have questions. °COMMON BRAND NAME(S): Tysabri °What should I tell my health care provider before I take this medicine? °They need to know if you have any of these conditions: °-immune system problems °-progressive multifocal leukoencephalopathy (PML) °-an unusual or allergic reaction to natalizumab, other medicines, foods, dyes, or preservatives °-pregnant or trying to get pregnant °-breast-feeding °How should I use this medicine? °This medicine is for infusion into a vein. It is given by a health care professional in a hospital or clinic setting. °A special MedGuide will be given to you by the pharmacist with each prescription and refill. Be sure to read this information carefully each time. °Talk to your pediatrician regarding the use of this medicine in children. This medicine is not approved for use in children. °Overdosage: If you think you have taken too much of this medicine contact a poison control center or emergency room at once. °NOTE: This medicine is only for you. Do not share this medicine with others. °What if I miss a dose? °It is important not to miss your dose. Call your doctor or health care professional if you are unable to keep an appointment. °What may interact with this medicine? °-azathioprine °-cyclosporine °-interferon °-6-mercaptopurine °-methotrexate °-steroid medicines like prednisone or cortisone °-TNF-alpha inhibitors like adalimumab, etanercept, and infliximab °-vaccines °This list may not describe all possible interactions. Give your health care provider a list of all the medicines, herbs, non-prescription drugs, or dietary supplements you use. Also tell them if you smoke, drink  alcohol, or use illegal drugs. Some items may interact with your medicine. °What should I watch for while using this medicine? °Your condition will be monitored carefully while you are receiving this medicine. Visit your doctor for regular check ups. Tell your doctor or healthcare professional if your symptoms do not start to get better or if they get worse. °Stay away from people who are sick. Call your doctor or health care professional for advice if you get a fever, chills or sore throat, or other symptoms of a cold or flu. Do not treat yourself. °In some patients, this medicine may cause a serious brain infection that may cause death. If you have any problems seeing, thinking, speaking, walking, or standing, tell your doctor right away. If you cannot reach your doctor, get urgent medical care. °What side effects may I notice from receiving this medicine? °Side effects that you should report to your doctor or health care professional as soon as possible: °-allergic reactions like skin rash, itching or hives, swelling of the face, lips, or tongue °-breathing problems °-changes in vision °-chest pain °-dark urine °-depression, feelings of sadness °-dizziness °-general ill feeling or flu-like symptoms °-irregular, missed, or painful menstrual periods °-light-colored stools °-loss of appetite, nausea °-muscle weakness °-problems with balance, talking, or walking °-right upper belly pain °-unusually weak or tired °-yellowing of the eyes or skin °Side effects that usually do not require medical attention (report to your doctor or health care professional if they continue or are bothersome): °-aches, pains °-headache °-stomach upset °-tiredness °This list may not describe all possible side effects. Call your doctor for medical advice about side effects. You may report side effects to FDA at 1-800-FDA-1088. °Where should   I keep my medicine? °This drug is given in a hospital or clinic and will not be stored at  home. °NOTE: This sheet is a summary. It may not cover all possible information. If you have questions about this medicine, talk to your doctor, pharmacist, or health care provider. °© 2017 Elsevier/Gold Standard (2008-08-29 13:33:21) ° °

## 2016-09-19 NOTE — Telephone Encounter (Signed)
Patient is returning your call.  

## 2016-09-19 NOTE — Telephone Encounter (Signed)
Patient's telephone is disconnected I have call her aunt and asking her to call me back . I need to know about Physical Therapy No one can go out because we need working telephone numbers.

## 2016-09-20 NOTE — Telephone Encounter (Signed)
I have spoke to [place] From [employer] and she is going to try and help patient and keep me updated . [phone] fax [phone]. Patient is aware also. Thanks Annabelle Harman

## 2016-09-26 ENCOUNTER — Telehealth: Payer: Self-pay | Admitting: Neurology

## 2016-09-26 NOTE — Telephone Encounter (Signed)
Patient called office in reference to having more help at home with assistance due to grandmother not longer living with patient.  Patient states she has help 2 hours a day but is needing more help longer with more things such as cooking, restroom help.  Please call

## 2016-09-26 NOTE — Telephone Encounter (Signed)
I have spoken with Lindsay Schneider this afternoon. She requests med. records that document her disabilities related to MS.  Last ov note up front [facility], and she will have someone stop by to get it tomorrow/fim

## 2016-10-09 ENCOUNTER — Telehealth: Payer: Self-pay | Admitting: Neurology

## 2016-10-09 NOTE — Telephone Encounter (Signed)
Pt called wanting to know how to go about getting a new wheelchair

## 2016-10-09 NOTE — Telephone Encounter (Signed)
LMOM that pt. can start with the company she got her first w/c from--they can let her know if ins. will cover another one now.  She then may need an appt. with [facility] to document her specific need for a new w/c./fim

## 2016-10-10 ENCOUNTER — Encounter (HOSPITAL_COMMUNITY): Payer: Medicaid Other

## 2016-10-12 ENCOUNTER — Telehealth: Payer: Self-pay | Admitting: Neurology

## 2016-10-12 NOTE — Telephone Encounter (Signed)
Spoke to patient - she would still like to schedule PT.  States she did not get their messages because her phone has been broken.  I provided her with the call back number for [facility] [phone]) and she will call them back to set up the appointment.

## 2016-10-12 NOTE — Telephone Encounter (Signed)
Lindsay Schneider Called from [facility] . Physical therapist has tried to reach out to patient ser val times with No response. He spoke to her once . Patient relayed she would call back and she need not  follow up.

## 2016-10-13 ENCOUNTER — Ambulatory Visit (HOSPITAL_COMMUNITY)
Admission: RE | Admit: 2016-10-13 | Payer: Medicaid Other | Source: Ambulatory Visit | Attending: Neurology | Admitting: Neurology

## 2016-10-16 ENCOUNTER — Telehealth: Payer: Self-pay | Admitting: *Deleted

## 2016-10-16 ENCOUNTER — Telehealth: Payer: Self-pay | Admitting: Neurology

## 2016-10-16 ENCOUNTER — Encounter: Payer: Self-pay | Admitting: *Deleted

## 2016-10-16 NOTE — Telephone Encounter (Signed)
error 

## 2016-10-16 NOTE — Telephone Encounter (Signed)
I have spoken with Lindsay Schneider this afternoon.  Letter confirming she is under [facility] care for the tx. of MS faxed to [employer] and copy mailed to her home address per her request/fim

## 2016-10-16 NOTE — Telephone Encounter (Signed)
duplicate/fim  

## 2016-10-16 NOTE — Telephone Encounter (Signed)
Patient called in stating that she is eligable for some assistants with [employer].. She stated that her recorders need to be faxed to [employer] fax # [phone] and her account number is [id number] these recorders need to state that she has MS.. She also stated that she would like a copy of her recorders as well.. Please call the patient to make sure everything is correct.Marland Kitchen

## 2016-11-08 ENCOUNTER — Ambulatory Visit (HOSPITAL_COMMUNITY): Admission: RE | Admit: 2016-11-08 | Payer: Medicaid Other | Source: Ambulatory Visit

## 2016-11-22 ENCOUNTER — Encounter (HOSPITAL_COMMUNITY): Payer: Self-pay

## 2016-11-22 ENCOUNTER — Ambulatory Visit (HOSPITAL_COMMUNITY)
Admission: RE | Admit: 2016-11-22 | Discharge: 2016-11-22 | Disposition: A | Discharge status: Home / Self Care | Payer: Medicaid Other | Source: Ambulatory Visit | Attending: Neurology | Admitting: Neurology

## 2016-11-22 DIAGNOSIS — G35 Multiple sclerosis: Secondary | ICD-10-CM | POA: Insufficient documentation

## 2016-11-22 DIAGNOSIS — Z993 Dependence on wheelchair: Secondary | ICD-10-CM | POA: Diagnosis not present

## 2016-11-22 MED ORDER — SODIUM CHLORIDE 0.9 % IV SOLN
INTRAVENOUS | Status: DC
  Administered 2016-11-22: 09:00:00 via INTRAVENOUS

## 2016-11-22 MED ORDER — SODIUM CHLORIDE 0.9 % IV SOLN
300.0000 mg | INTRAVENOUS | Status: DC
  Administered 2016-11-22: 300 mg via INTRAVENOUS
  Filled 2016-11-22: qty 15

## 2016-11-22 NOTE — Progress Notes (Signed)
Post- infusion Tysabri, pt. Stayed 25 minutes,pt. To follow-up with doctor with any problems.

## 2016-11-22 NOTE — Discharge Instructions (Signed)

## 2016-12-12 ENCOUNTER — Telehealth: Payer: Self-pay | Admitting: Neurology

## 2016-12-12 DIAGNOSIS — Z993 Dependence on wheelchair: Secondary | ICD-10-CM

## 2016-12-12 DIAGNOSIS — R269 Unspecified abnormalities of gait and mobility: Secondary | ICD-10-CM

## 2016-12-12 DIAGNOSIS — G35 Multiple sclerosis: Secondary | ICD-10-CM

## 2016-12-12 NOTE — Addendum Note (Signed)
Addended by: Candis Schatz I on: 12/12/2016 02:31 PM   Modules accepted: Orders

## 2016-12-12 NOTE — Telephone Encounter (Signed)
Sent via Epic to [place] with [place] ada

## 2016-12-12 NOTE — Telephone Encounter (Signed)
I have spoken with Lindsay Schneider this afternoon.  She sts. Medicare is now her primary insurance; she would like to try again to get home health PT approved.  Order in EPIC/fim

## 2016-12-12 NOTE — Telephone Encounter (Signed)
Patient called office in reference to referral for home therapy patient stated she now has Medicare primary and Medicaid secondary and would like to see if she is able to start referral again for services.  Please call

## 2016-12-20 ENCOUNTER — Telehealth: Payer: Self-pay | Admitting: Neurology

## 2016-12-20 ENCOUNTER — Ambulatory Visit (HOSPITAL_COMMUNITY)
Admission: RE | Admit: 2016-12-20 | Discharge: 2016-12-20 | Disposition: A | Discharge status: Home / Self Care | Payer: Medicaid Other | Source: Ambulatory Visit | Attending: Neurology | Admitting: Neurology

## 2016-12-20 NOTE — Telephone Encounter (Signed)
I have spoken with Lindsay Schneider and moved appt. to 12/27/16/fim

## 2016-12-20 NOTE — Telephone Encounter (Signed)
Patient called office in reference to [facility] with Frances Furbish coming to her home to do a evaluation.  Per patient Dutch Quint asked patient when she had last seen [name] she replied February and that she has a follow up visit in August.  Patient states he advised patient she is needing to be seen sooner than August before they will proceed to move forward with therapy for patient.  Please call

## 2016-12-27 ENCOUNTER — Encounter: Payer: Self-pay | Admitting: Neurology

## 2016-12-27 ENCOUNTER — Ambulatory Visit (INDEPENDENT_AMBULATORY_CARE_PROVIDER_SITE_OTHER): Payer: Medicaid Other | Admitting: Neurology

## 2016-12-27 ENCOUNTER — Encounter (INDEPENDENT_AMBULATORY_CARE_PROVIDER_SITE_OTHER): Payer: Self-pay

## 2016-12-27 VITALS — BP 134/62 | HR 66 | Resp 16 | Ht 62.0 in | Wt 157.0 lb

## 2016-12-27 DIAGNOSIS — G8114 Spastic hemiplegia affecting left nondominant side: Secondary | ICD-10-CM | POA: Diagnosis not present

## 2016-12-27 DIAGNOSIS — R5383 Other fatigue: Secondary | ICD-10-CM | POA: Diagnosis not present

## 2016-12-27 DIAGNOSIS — R269 Unspecified abnormalities of gait and mobility: Secondary | ICD-10-CM

## 2016-12-27 DIAGNOSIS — N399 Disorder of urinary system, unspecified: Secondary | ICD-10-CM | POA: Diagnosis not present

## 2016-12-27 DIAGNOSIS — G35 Multiple sclerosis: Secondary | ICD-10-CM

## 2016-12-27 HISTORY — DX: Spastic hemiplegia affecting left nondominant side: G81.14

## 2016-12-27 NOTE — Progress Notes (Signed)
[facility]  PATIENT: Lindsay Schneider DOB: 1986/07/01  REFERRING DOCTOR OR PCP:   SOURCE:   _________________________________   HISTORICAL  CHIEF COMPLAINT:  Chief Complaint  Patient presents with  . Multiple Sclerosis    Sts. she continues to tolerate Tysabri well. JCV last checked 08/31/16 and was 0.21  She is here today to document the need for PT.  She also c/o peeling skin on her feet, itching./fim    HISTORY OF PRESENT ILLNESS:  Lindsay Schneider is a 31 year old woman who was diagnosed with MS in 2011 after presenting with left sided arm and leg numbness and weakness.     MS:   She is on Tysabri. Her JCV Ab titers have been negative.   She tolerates Tysabri well.  She has had no definite exacerbation.       She has been on Tysabri for about 18 months but had been on it previously as well.  Gait/strength/sensation: She has had more weakness in her legs, left worse than right.   Her left arm is weak but right arm is strong.    She is whheelchair bound and has needed more help with transfers.    She also has left side spasticity.   Handwriting is mildly better (right hand) but she tires out and her hand shakes when that occurs.     She takes baclofen 90 mg a day and tizanidine only once a day .   Higher dose was not well tolerated    Sometimes, she hgets severe right leg spasms where the leg locks up.   This mostly occurs when she is being moved.     Bladder:   She has difficulty with her bladder. She has urinary urgency and frequency and will have occasional incontinence if she cannot get to the bathroom and transfer in time.  Oxybutynin only helpe a little bit so she stopped.     Vision:   She denies any difficulty with her vision.  Vision is blurry in the mornings.   .    Fatigue:   She has physical fatigue.   Her fatigue improved when phentermine was started.   She also has been able to lose a couple pounds.  She is sleeping ok most nights on  tizanidine/baclofen combo.    Mood/cognition:   Mood is ok with only occasional mild depression and no anxiety.     She denies any major difficulties with cognitive function. She has difficulty focusing but is doing better with phentermine.  Left hip pain:   She has more pain in the left hip.    MS History:  In 2011, onset of left arm and leg numbness and weakness.  Her symptoms develop over 1 day. She had an MRI performed with lesions consistent with MS. She started to see Dr. Terrace Arabia here at [facility]. She was placed on Rebif. However, she stopped because her legs felt weaker when she was on it. She then transferred her care to Dr. Renne Crigler at [facility].     She was started on Tysabri.  She had several infusions of Tysabri but transportation was difficult to get to [facility]. Therefore, Dr. Renne Crigler switched her to Rituxan although she tolerated the medication well. She had a significant relapse with bilateral leg weakness within a couple weeks of the infusion. Therefore, 6 months later, she started Tysabri again. Due to difficulties with transportation, she transferred care to [place] in October 2016.   REVIEW OF SYSTEMS: Constitutional: No fevers,  chills, sweats, or change in appetite.   She has fatigue and poor sleep.    Eyes: No visual changes, double vision, eye pain Ear, nose and throat: No hearing loss, ear pain, nasal congestion, sore throat Cardiovascular: No chest pain, palpitations Respiratory: No shortness of breath at rest or with exertion.   No wheezes GastrointestinaI: No nausea, vomiting, diarrhea, abdominal pain, fecal incontinence Genitourinary: Shehas urinary urgency and frequency with occ incontinence.    Hasnocturia. Musculoskeletal: No neck pain, back pain Integumentary: No rash, pruritus, skin lesions Neurological: as above Psychiatric:   Some depression at this time.  No anxiety Endocrine: No palpitations, diaphoresis, change in appetite, change in weigh or increased  thirst Hematologic/Lymphatic: No anemia, purpura, petechiae. Allergic/Immunologic: No itchy/runny eyes, nasal congestion, recent allergic reactions, rashes  ALLERGIES: No Known Allergies  HOME MEDICATIONS:  Current Outpatient Prescriptions:  .  baclofen (LIORESAL) 10 MG tablet, TAKE 1 TABLET BY MOUTH 3 TIMES A DAY, Disp: 90 tablet, Rfl: 8 .  Dalfampridine (AMPYRA PO), Take 10 mg by mouth. , Disp: , Rfl:  .  diazepam (VALIUM) 5 MG tablet, Take 1 tablet (5 mg total) by mouth every 8 (eight) hours as needed for anxiety. (Patient not taking: Reported on 08/30/2016), Disp: 90 tablet, Rfl: 4 .  meloxicam (MOBIC) 7.5 MG tablet, Take 1 tablet (7.5 mg total) by mouth daily., Disp: 30 tablet, Rfl: 5 .  natalizumab (TYSABRI) 300 MG/15ML injection, Inject 300 mg into the vein every 30 (thirty) days., Disp: , Rfl:  .  oxybutynin (DITROPAN) 5 MG tablet, Take 1 tablet (5 mg total) by mouth 3 (three) times daily., Disp: 90 tablet, Rfl: 11 .  phentermine 37.5 MG capsule, Take 1 capsule (37.5 mg total) by mouth every morning., Disp: 30 capsule, Rfl: 5 .  tiZANidine (ZANAFLEX) 4 MG tablet, Take 1 tablet (4 mg total) by mouth 3 (three) times daily., Disp: 90 tablet, Rfl: 11  PAST MEDICAL HISTORY: Past Medical History:  Diagnosis Date  . Movement disorder   . MS (multiple sclerosis) (HCC)     PAST SURGICAL HISTORY: Past Surgical History:  Procedure Laterality Date  . CESAREAN SECTION      FAMILY HISTORY: Family History  Problem Relation Age of Onset  . Asthma Mother   . Hypertension Father   . Healthy Sister   . Healthy Brother     SOCIAL HISTORY:  Social History   Social History  . Marital status: Single    Spouse name: N/A  . Number of children: N/A  . Years of education: N/A   Occupational History  . Not on file.   Social History Main Topics  . Smoking status: Never Smoker  . Smokeless tobacco: Never Used  . Alcohol use No  . Drug use: No  . Sexual activity: Not Currently     Birth control/ protection: Injection   Other Topics Concern  . Not on file   Social History Narrative  . No narrative on file     PHYSICAL EXAM  Vitals:   12/27/16 1548  BP: 134/62  Pulse: 66  Resp: 16  Weight: 157 lb (71.2 kg)  Height: 5\' 2"  (1.575 m)    Body mass index is 28.72 kg/m.   General: The patient is well-developed and well-nourished and in no acute distress  Skin:No rash or edema  Neurologic Exam  Mental status: The patient is alert and oriented x 3 at the time of the examination. The patient has apparent normal recent and remote memory, with  an apparently normal attention span and concentration ability.   Speech is normal.  Cranial nerves: Extraocular movements are full.. Facial strength and sensation is normal.Trapezius and sternocleidomastoid strength is normal. No dysarthria is noted.  The tongue is midline, and the patient has symmetric elevation of the soft palate. No obvious hearing deficits are noted.  Motor:  Muscle bulk is inormal and tone is greatly increased in her legs (L>R) and the left arm.   Right arm with good tone/strength.  Strength is 3 to 4-/5 in the left distal arm and 2-3 in the proximal arm. Strength is 2+ in proximal right leg and 2- in proximal left leg and 4- in right knee extension and 3 in left knee extension , 2+ right and  1 left ankle extensors,    Sensory: Sensory testing is intact to touch and vibration except decreased vibration sensation in left leg.  Coordination: Cerebellar testing reveals poor left finger-nose-finger and she can't do heel-to-shin bilaterally.  Gait and station: She she needs bilateral support to stand and cannot remain standing for more than a short period of time. He cannot walk       Reflexes: Deep tendon reflexes are increased in arms, left > right and in legs with spread att knees and sustained clonus at the ankles.       DIAGNOSTIC DATA (LABS, IMAGING, TESTING) - I reviewed patient records, labs,  notes, testing and imaging myself where available.  Lab Results  Component Value Date   WBC 8.4 08/30/2016   HGB 11.8 08/30/2016   HCT 35.9 08/30/2016   MCV 81 08/30/2016   PLT 226 08/30/2016      Component Value Date/Time   NA 138 03/05/2012 1805   K 3.6 03/05/2012 1805   CL 105 03/05/2012 1805   CO2 21 03/05/2012 1805   GLUCOSE 96 03/05/2012 1805   BUN 7 03/05/2012 1805   CREATININE 0.58 03/05/2012 1805   CALCIUM 9.6 03/05/2012 1805   PROT 7.6 03/05/2012 1805   ALBUMIN 4.7 03/05/2012 1805   AST 14 03/05/2012 1805   ALT 9 03/05/2012 1805   ALKPHOS 76 03/05/2012 1805   BILITOT 0.7 03/05/2012 1805   GFRNONAA >90 03/05/2012 1805   GFRAA >90 03/05/2012 1805       ASSESSMENT AND PLAN  Multiple sclerosis (HCC) - Plan: Ambulatory referral to Physical Therapy, For home use only DME lightweight manual wheelchair with seat cushion  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    She will continue Tysabri. She remains JCV antibody negative. 2.    We will renew home physical therapy as she is getting a benefit from it. Try to stay active and exercises as tolerated.   3.    Light weight wheelchair.  4.    Continue Phentermine for fatigue, attention/focusweight loss 4.    Return in 6 months or sooner if there are new or worsening neurologic symptoms.    Ashish Rossetti A. Epimenio Foot, MD, PhD 12/27/2016, 4:31 PM Certified in Neurology, Clinical Neurophysiology, Sleep Medicine, Pain Medicine and Neuroimaging  [facility] [address], [address] [place], [place] [address] [phone]

## 2016-12-27 NOTE — Patient Instructions (Signed)
Increase tizanidine to 1/2 pill in the morning, 1/2 pill in the afternoon and 1 pill at bedtime

## 2017-01-01 ENCOUNTER — Ambulatory Visit (HOSPITAL_COMMUNITY)
Admission: RE | Admit: 2017-01-01 | Discharge: 2017-01-01 | Disposition: A | Discharge status: Home / Self Care | Payer: Medicaid Other | Source: Ambulatory Visit | Attending: Neurology | Admitting: Neurology

## 2017-01-01 ENCOUNTER — Encounter (HOSPITAL_COMMUNITY): Payer: Self-pay

## 2017-01-01 DIAGNOSIS — G35 Multiple sclerosis: Secondary | ICD-10-CM | POA: Insufficient documentation

## 2017-01-01 MED ORDER — SODIUM CHLORIDE 0.9 % IV SOLN
300.0000 mg | INTRAVENOUS | Status: DC
  Administered 2017-01-01: 300 mg via INTRAVENOUS
  Filled 2017-01-01: qty 15

## 2017-01-01 MED ORDER — SODIUM CHLORIDE 0.9 % IV SOLN
INTRAVENOUS | Status: DC
  Administered 2017-01-01: 09:00:00 via INTRAVENOUS

## 2017-01-01 NOTE — Discharge Instructions (Signed)
° °ATTENTION: ° °If you are going to be 15 or more minutes late for your appointment, please call [phone] to make other arrangements for your treatment. ° °If you arrive early for your schedule appointment, you may have to wait until your scheduled time. ° ° °Tysabri °Natalizumab injection °What is this medicine? °NATALIZUMAB (na ta LIZ you mab) is used to treat relapsing multiple sclerosis. This drug is not a cure. It is also used to treat Crohn's disease. °This medicine may be used for other purposes; ask your health care provider or pharmacist if you have questions. °COMMON BRAND NAME(S): Tysabri °What should I tell my health care provider before I take this medicine? °They need to know if you have any of these conditions: °-immune system problems °-progressive multifocal leukoencephalopathy (PML) °-an unusual or allergic reaction to natalizumab, other medicines, foods, dyes, or preservatives °-pregnant or trying to get pregnant °-breast-feeding °How should I use this medicine? °This medicine is for infusion into a vein. It is given by a health care professional in a hospital or clinic setting. °A special MedGuide will be given to you by the pharmacist with each prescription and refill. Be sure to read this information carefully each time. °Talk to your pediatrician regarding the use of this medicine in children. This medicine is not approved for use in children. °Overdosage: If you think you have taken too much of this medicine contact a poison control center or emergency room at once. °NOTE: This medicine is only for you. Do not share this medicine with others. °What if I miss a dose? °It is important not to miss your dose. Call your doctor or health care professional if you are unable to keep an appointment. °What may interact with this medicine? °-azathioprine °-cyclosporine °-interferon °-6-mercaptopurine °-methotrexate °-steroid medicines like prednisone or cortisone °-TNF-alpha inhibitors like  adalimumab, etanercept, and infliximab °-vaccines °This list may not describe all possible interactions. Give your health care provider a list of all the medicines, herbs, non-prescription drugs, or dietary supplements you use. Also tell them if you smoke, drink alcohol, or use illegal drugs. Some items may interact with your medicine. °What should I watch for while using this medicine? °Your condition will be monitored carefully while you are receiving this medicine. Visit your doctor for regular check ups. Tell your doctor or healthcare professional if your symptoms do not start to get better or if they get worse. °Stay away from people who are sick. Call your doctor or health care professional for advice if you get a fever, chills or sore throat, or other symptoms of a cold or flu. Do not treat yourself. °In some patients, this medicine may cause a serious brain infection that may cause death. If you have any problems seeing, thinking, speaking, walking, or standing, tell your doctor right away. If you cannot reach your doctor, get urgent medical care. °What side effects may I notice from receiving this medicine? °Side effects that you should report to your doctor or health care professional as soon as possible: °-allergic reactions like skin rash, itching or hives, swelling of the face, lips, or tongue °-breathing problems °-changes in vision °-chest pain °-dark urine °-depression, feelings of sadness °-dizziness °-general ill feeling or flu-like symptoms °-irregular, missed, or painful menstrual periods °-light-colored stools °-loss of appetite, nausea °-muscle weakness °-problems with balance, talking, or walking °-right upper belly pain °-unusually weak or tired °-yellowing of the eyes or skin °Side effects that usually do not require medical attention (report to your   doctor or health care professional if they continue or are bothersome): °-aches, pains °-headache °-stomach upset °-tiredness °This list may  not describe all possible side effects. Call your doctor for medical advice about side effects. You may report side effects to FDA at 1-800-FDA-1088. °Where should I keep my medicine? °This drug is given in a hospital or clinic and will not be stored at home. °NOTE: This sheet is a summary. It may not cover all possible information. If you have questions about this medicine, talk to your doctor, pharmacist, or health care provider. °© 2018 Elsevier/Gold Standard (2008-08-29 13:33:21) ° °

## 2017-01-01 NOTE — Progress Notes (Signed)
Patient stayed 25 min post Tysabri infusion. Aware of one hour recommended observation time but patient stated she needed to leave for transportation reasons. Aware to call MD for any problems or questions.

## 2017-01-23 ENCOUNTER — Other Ambulatory Visit: Payer: Self-pay | Admitting: *Deleted

## 2017-01-23 DIAGNOSIS — G35 Multiple sclerosis: Secondary | ICD-10-CM

## 2017-01-23 DIAGNOSIS — R269 Unspecified abnormalities of gait and mobility: Secondary | ICD-10-CM

## 2017-01-23 DIAGNOSIS — G8114 Spastic hemiplegia affecting left nondominant side: Secondary | ICD-10-CM

## 2017-01-29 ENCOUNTER — Encounter (HOSPITAL_COMMUNITY): Payer: Self-pay

## 2017-01-29 ENCOUNTER — Ambulatory Visit (HOSPITAL_COMMUNITY)
Admission: RE | Admit: 2017-01-29 | Discharge: 2017-01-29 | Disposition: A | Discharge status: Home / Self Care | Payer: Medicare Other | Source: Ambulatory Visit | Attending: Neurology | Admitting: Neurology

## 2017-01-29 DIAGNOSIS — G35 Multiple sclerosis: Secondary | ICD-10-CM | POA: Insufficient documentation

## 2017-01-29 MED ORDER — SODIUM CHLORIDE 0.9 % IV SOLN
300.0000 mg | INTRAVENOUS | Status: DC
  Administered 2017-01-29: 300 mg via INTRAVENOUS
  Filled 2017-01-29: qty 15

## 2017-01-29 MED ORDER — SODIUM CHLORIDE 0.9 % IV SOLN
INTRAVENOUS | Status: DC
  Administered 2017-01-29: 11:00:00 via INTRAVENOUS

## 2017-01-29 NOTE — Progress Notes (Signed)
Patient here for Tysabri infusion. Tolerated well. Had required 20cc NS flush afterwards. Was only able to stay for 15 min out of the recommended 1 hour observation time due to transportation (as well as her CNA meeting her at her home). Aware of next appt. Time. D/c via w/c to transportation [place].

## 2017-01-30 ENCOUNTER — Telehealth: Payer: Self-pay | Admitting: Neurology

## 2017-01-30 NOTE — Telephone Encounter (Signed)
Noted/fim 

## 2017-01-30 NOTE — Telephone Encounter (Signed)
Lindsay Schneider with [facility] called office in reference to start of care date change per patient request.  New date will be 01/31/17. FYI

## 2017-01-31 ENCOUNTER — Encounter: Payer: Self-pay | Admitting: *Deleted

## 2017-01-31 DIAGNOSIS — G35 Multiple sclerosis: Secondary | ICD-10-CM | POA: Diagnosis not present

## 2017-01-31 DIAGNOSIS — G8114 Spastic hemiplegia affecting left nondominant side: Secondary | ICD-10-CM | POA: Diagnosis not present

## 2017-01-31 NOTE — Telephone Encounter (Signed)
Maurine Minister with [facility] requesting PT once a week for one week and 2 times a week for 4 weeks.

## 2017-01-31 NOTE — Telephone Encounter (Signed)
LMOM for Maurine Minister that per RAS, ok for PT one week and then twice a week for 4 wks.  He does not need to return this call unless he has other questions/fim

## 2017-02-05 DIAGNOSIS — G8114 Spastic hemiplegia affecting left nondominant side: Secondary | ICD-10-CM | POA: Diagnosis not present

## 2017-02-05 DIAGNOSIS — G35 Multiple sclerosis: Secondary | ICD-10-CM | POA: Diagnosis not present

## 2017-02-06 NOTE — Telephone Encounter (Signed)
LMOM (identified vm) for Lindsay Schneider that per RAS, ok for left AFO.  If we need to send referral somewhere, please let me know/fim

## 2017-02-06 NOTE — Telephone Encounter (Signed)
[facility] [phone] called request order for left ankle AFO. He said when she stands the left ankle turns. He is working on the order for the wheelchair.

## 2017-02-08 ENCOUNTER — Encounter: Payer: Self-pay | Admitting: *Deleted

## 2017-02-08 ENCOUNTER — Telehealth: Payer: Self-pay | Admitting: Neurology

## 2017-02-08 DIAGNOSIS — M21372 Foot drop, left foot: Secondary | ICD-10-CM

## 2017-02-08 DIAGNOSIS — G8114 Spastic hemiplegia affecting left nondominant side: Secondary | ICD-10-CM | POA: Diagnosis not present

## 2017-02-08 DIAGNOSIS — G35 Multiple sclerosis: Secondary | ICD-10-CM | POA: Diagnosis not present

## 2017-02-08 HISTORY — DX: Foot drop, left foot: M21.372

## 2017-02-08 NOTE — Telephone Encounter (Signed)
Lindsay Schneider with [facility] called office in reference to patient having order for wheel chair, but patient is requesting it be faxed to [facility] due to no one in family being able to bring it and drop off.  Please call

## 2017-02-08 NOTE — Telephone Encounter (Signed)
Maurine Minister from [facility] calling re: the verbal authorization for the AFO, it needs to be faxed to [employer] for fitting [phone]

## 2017-02-08 NOTE — Telephone Encounter (Signed)
Order for left AFO faxed to [employer]/fim

## 2017-02-08 NOTE — Telephone Encounter (Signed)
w/c order faxed to [facility] as requested/fim

## 2017-02-12 DIAGNOSIS — G35 Multiple sclerosis: Secondary | ICD-10-CM | POA: Diagnosis not present

## 2017-02-12 DIAGNOSIS — G8114 Spastic hemiplegia affecting left nondominant side: Secondary | ICD-10-CM | POA: Diagnosis not present

## 2017-02-14 DIAGNOSIS — G8114 Spastic hemiplegia affecting left nondominant side: Secondary | ICD-10-CM | POA: Diagnosis not present

## 2017-02-14 DIAGNOSIS — G35 Multiple sclerosis: Secondary | ICD-10-CM | POA: Diagnosis not present

## 2017-02-19 ENCOUNTER — Telehealth: Payer: Self-pay | Admitting: Neurology

## 2017-02-19 DIAGNOSIS — G35 Multiple sclerosis: Secondary | ICD-10-CM | POA: Diagnosis not present

## 2017-02-19 DIAGNOSIS — G8114 Spastic hemiplegia affecting left nondominant side: Secondary | ICD-10-CM | POA: Diagnosis not present

## 2017-02-19 NOTE — Telephone Encounter (Signed)
LMTC.  I am not sure what he's asking for.  We faxed w/c orders to [facility].  What orders he referring to?

## 2017-02-19 NOTE — Telephone Encounter (Signed)
error 

## 2017-02-19 NOTE — Telephone Encounter (Signed)
Lindsay Schneider with Lindsay Schneider called office to see if we can refax orders for w/c due to patient not being contacted yet and he will then follow up with them.  FYI

## 2017-02-26 ENCOUNTER — Ambulatory Visit (HOSPITAL_COMMUNITY): Admission: RE | Admit: 2017-02-26 | Payer: Medicaid Other | Source: Ambulatory Visit

## 2017-02-26 ENCOUNTER — Telehealth: Payer: Self-pay | Admitting: Neurology

## 2017-02-26 DIAGNOSIS — G35 Multiple sclerosis: Secondary | ICD-10-CM | POA: Diagnosis not present

## 2017-02-26 DIAGNOSIS — G8114 Spastic hemiplegia affecting left nondominant side: Secondary | ICD-10-CM | POA: Diagnosis not present

## 2017-02-26 NOTE — Telephone Encounter (Signed)
Noted, thanks!

## 2017-02-26 NOTE — Telephone Encounter (Signed)
Lindsay Schneider  Home health called to report FYI pt missed a physical therapy appointment  And they will just extend care as far as 1 extra visit.  If there are questions he can be reached at [phone]

## 2017-02-27 ENCOUNTER — Ambulatory Visit: Payer: Medicaid Other | Admitting: Neurology

## 2017-03-05 ENCOUNTER — Telehealth: Payer: Self-pay | Admitting: Neurology

## 2017-03-05 NOTE — Telephone Encounter (Signed)
I have spoken with Shraddha this afternoon.  RAS gave her a letter confirming dx. of MS in March.  She sts she doesn't have a copy of it.  Letter printed, mailed to home address/fim

## 2017-03-05 NOTE — Telephone Encounter (Signed)
Pt is asking for a letter to be mailed to her stating that she has MS so that she can get assistance with bills.  Please call

## 2017-03-20 ENCOUNTER — Encounter (HOSPITAL_COMMUNITY)
Admission: RE | Admit: 2017-03-20 | Discharge: 2017-03-20 | Disposition: A | Discharge status: Home / Self Care | Payer: Medicare Other | Source: Ambulatory Visit | Attending: Neurology | Admitting: Neurology

## 2017-03-20 ENCOUNTER — Encounter (HOSPITAL_COMMUNITY): Payer: Self-pay

## 2017-03-20 DIAGNOSIS — G35 Multiple sclerosis: Secondary | ICD-10-CM | POA: Insufficient documentation

## 2017-03-20 MED ORDER — SODIUM CHLORIDE 0.9 % IV SOLN
300.0000 mg | INTRAVENOUS | Status: DC
  Administered 2017-03-20: 300 mg via INTRAVENOUS
  Filled 2017-03-20: qty 15

## 2017-03-20 MED ORDER — SODIUM CHLORIDE 0.9 % IV SOLN
INTRAVENOUS | Status: DC
  Administered 2017-03-20: 10:00:00 via INTRAVENOUS

## 2017-03-20 NOTE — Progress Notes (Signed)
tysabri infusion given.  Next appointment for 9/25 given to pt.  Pt stayed 45 min. Post infusion.  Pt knows to call MD with questions/concerns.  Pt was assisted to bedside commode by Pauline Aus, RN when infusion was complete.  Pt was taken to lobby in her wheelchair at d/c.

## 2017-04-04 NOTE — Telephone Encounter (Signed)
I have spoken with [place] and advised ok to pick up copy of letter in our office today.  Will be ready in the next 30 min.  Letter printed, awaiting RAS sig/fim

## 2017-04-04 NOTE — Telephone Encounter (Signed)
Pt calling back to inform that she has yet to receive the letter mentioned on 8-13, pt is asking if it can be reprinted and she just come and pick up the letter because she needs it by 5pm today, please call

## 2017-04-04 NOTE — Telephone Encounter (Signed)
Letter up front [facility]/fim 

## 2017-04-11 ENCOUNTER — Other Ambulatory Visit: Payer: Self-pay | Admitting: Neurology

## 2017-04-17 ENCOUNTER — Encounter (HOSPITAL_COMMUNITY): Payer: Self-pay

## 2017-04-17 ENCOUNTER — Encounter (HOSPITAL_COMMUNITY)
Admission: RE | Admit: 2017-04-17 | Discharge: 2017-04-17 | Disposition: A | Discharge status: Home / Self Care | Payer: Medicare Other | Source: Ambulatory Visit | Attending: Neurology | Admitting: Neurology

## 2017-04-17 DIAGNOSIS — G35 Multiple sclerosis: Secondary | ICD-10-CM | POA: Insufficient documentation

## 2017-04-17 MED ORDER — NATALIZUMAB 300 MG/15ML IV CONC
300.0000 mg | INTRAVENOUS | Status: DC
  Administered 2017-04-17: 300 mg via INTRAVENOUS
  Filled 2017-04-17: qty 15

## 2017-04-17 MED ORDER — SODIUM CHLORIDE 0.9 % IV SOLN
INTRAVENOUS | Status: DC
Start: 2017-04-17 — End: 2017-04-18
  Administered 2017-04-17: 10:00:00 via INTRAVENOUS

## 2017-04-17 NOTE — Progress Notes (Signed)
Patient here this am for Tysabri infusion. Tolerated well. Up to bedside commode with stand/pivot and gait belt with 3 staff to assist. (Patient stated was not able to stay entire time for one hour observation post infusion due to transportation so 20cc NS flushed per order and IV d/c.)   Patient comes to [facility] short stay in her own wheelchair as comes via transportation [place]. RN noted wheelchair was in poor condition with a rip in the seat and one brake not working. In lengthy conversation with patient she told me that she is unable to get a new w/c until 2-3 years as she has one that is only a couple of years old. The one she most recently got was an electronic one but per the patient she is unable to use it because her current rental house is not handicapped accessible. She said she has no way to get the electric w/c out of the house as doesn't have a ramp. Currently to leave the home she has two people to carry her and transfer her to manual wheelchair she is using.   RN called around within in the hospital (Child psychotherapist, case Production designer, theatre/television/film, home equipment company) to see if any resources available to assist patient with obtaining a safer manual wheelchair. No current resources available for outpatients.  Patient ready for d/c home so highly encouraged patient to ask around if any neighbors or friends or churches may have an extra wheelchair she might be able to use as hers is in such poor condition. She stated she would and understood our concern was for her safety.   Called and spoke to Faith RN at Dr. Bonnita [facility] office to look into resources regarding above (current need for new w/c as well as ramp). She stated they have previously connected patient to the local MS society in regards to having a ramp built. All above shared with Faith and she stated she will again call Page at the [facility] society [phone]) and have them contact patient.

## 2017-04-23 ENCOUNTER — Telehealth: Payer: Self-pay | Admitting: Neurology

## 2017-04-23 DIAGNOSIS — R269 Unspecified abnormalities of gait and mobility: Secondary | ICD-10-CM

## 2017-04-23 DIAGNOSIS — G35 Multiple sclerosis: Secondary | ICD-10-CM

## 2017-04-23 NOTE — Telephone Encounter (Signed)
DME order for one bathtub bench mailed to pt's home address ([address] per pt's request/fim

## 2017-04-23 NOTE — Addendum Note (Signed)
Addended by: Candis Schatz I on: 04/23/2017 03:32 PM   Modules accepted: Orders

## 2017-04-23 NOTE — Telephone Encounter (Signed)
Patient calling to get a tub bench.

## 2017-05-17 ENCOUNTER — Encounter (HOSPITAL_COMMUNITY): Payer: Self-pay

## 2017-05-17 ENCOUNTER — Encounter (HOSPITAL_COMMUNITY)
Admission: RE | Admit: 2017-05-17 | Discharge: 2017-05-17 | Disposition: A | Discharge status: Home / Self Care | Payer: Medicare Other | Source: Ambulatory Visit | Attending: Neurology | Admitting: Neurology

## 2017-05-17 DIAGNOSIS — G35 Multiple sclerosis: Secondary | ICD-10-CM | POA: Insufficient documentation

## 2017-05-17 MED ORDER — NATALIZUMAB 300 MG/15ML IV CONC
300.0000 mg | INTRAVENOUS | Status: AC
  Administered 2017-05-17: 300 mg via INTRAVENOUS
  Filled 2017-05-17: qty 15

## 2017-05-17 MED ORDER — SODIUM CHLORIDE 0.9 % IV SOLN
INTRAVENOUS | Status: AC
  Administered 2017-05-17: 10:00:00 via INTRAVENOUS

## 2017-05-23 ENCOUNTER — Telehealth: Payer: Self-pay | Admitting: Neurology

## 2017-05-23 NOTE — Telephone Encounter (Signed)
Lindsay Schneider with [facility] CAT program saw patient last week and is calling to advise her BP was 140/84. A returned call is not needed.

## 2017-05-23 NOTE — Telephone Encounter (Signed)
Noted/fim 

## 2017-05-29 ENCOUNTER — Ambulatory Visit: Payer: Medicaid Other | Admitting: Neurology

## 2017-05-30 ENCOUNTER — Encounter: Payer: Self-pay | Admitting: Neurology

## 2017-06-08 ENCOUNTER — Telehealth: Payer: Self-pay | Admitting: Neurology

## 2017-06-08 NOTE — Telephone Encounter (Signed)
Spoke with Olegario Messier at [employer],  and faxed ov note supporting need for AFO/fim

## 2017-06-08 NOTE — Telephone Encounter (Signed)
Pt called said [employer] is waiting to hear from the provider for approval on AFO brace. See tele note 02/08/17.

## 2017-06-13 ENCOUNTER — Other Ambulatory Visit: Payer: Self-pay | Admitting: Neurology

## 2017-06-15 ENCOUNTER — Encounter (HOSPITAL_COMMUNITY): Payer: Medicare Other

## 2017-06-22 ENCOUNTER — Encounter (HOSPITAL_COMMUNITY): Payer: Self-pay

## 2017-06-22 ENCOUNTER — Ambulatory Visit (HOSPITAL_COMMUNITY)
Admission: RE | Admit: 2017-06-22 | Discharge: 2017-06-22 | Disposition: A | Discharge status: Home / Self Care | Payer: Medicare Other | Source: Ambulatory Visit | Attending: Neurology | Admitting: Neurology

## 2017-06-22 DIAGNOSIS — G35 Multiple sclerosis: Secondary | ICD-10-CM | POA: Insufficient documentation

## 2017-06-22 MED ORDER — SODIUM CHLORIDE 0.9 % IV SOLN
INTRAVENOUS | Status: DC
  Administered 2017-06-22: 09:00:00 via INTRAVENOUS

## 2017-06-22 MED ORDER — SODIUM CHLORIDE 0.9 % IV SOLN
300.0000 mg | INTRAVENOUS | Status: DC
  Administered 2017-06-22: 300 mg via INTRAVENOUS
  Filled 2017-06-22: qty 15

## 2017-06-22 NOTE — Progress Notes (Signed)
Patient here for her Tysabri infusion. Stayed about 20 min post infusion for NS flush then had to leave due to transportation. Escorted to exit via w/c.

## 2017-06-22 NOTE — Discharge Instructions (Signed)

## 2017-06-27 DIAGNOSIS — Z5181 Encounter for therapeutic drug level monitoring: Secondary | ICD-10-CM | POA: Diagnosis not present

## 2017-07-03 DIAGNOSIS — Z5181 Encounter for therapeutic drug level monitoring: Secondary | ICD-10-CM | POA: Diagnosis not present

## 2017-07-05 DIAGNOSIS — Z5181 Encounter for therapeutic drug level monitoring: Secondary | ICD-10-CM | POA: Diagnosis not present

## 2017-07-10 DIAGNOSIS — Z5181 Encounter for therapeutic drug level monitoring: Secondary | ICD-10-CM | POA: Diagnosis not present

## 2017-07-13 ENCOUNTER — Encounter (HOSPITAL_COMMUNITY): Payer: Medicare Other

## 2017-07-18 ENCOUNTER — Telehealth: Payer: Self-pay | Admitting: Neurology

## 2017-07-18 MED ORDER — DALFAMPRIDINE ER 10 MG PO TB12: 10.0000 mg | ORAL_TABLET | Freq: Two times a day (BID) | ORAL | 3 refills | Status: DC

## 2017-07-18 NOTE — Telephone Encounter (Signed)
Dail @ [facility] has called for RN to discuss Ampyra please call her at [phone](this is her direct #)

## 2017-07-18 NOTE — Addendum Note (Signed)
Addended by: York Spaniel on: 07/18/2017 05:22 PM   Modules accepted: Orders

## 2017-07-18 NOTE — Telephone Encounter (Signed)
I will write the prescription for Ampyra.

## 2017-07-18 NOTE — Telephone Encounter (Signed)
Called Dail back. Since patient now under care of [name], [facility] requesting new rx ampyra be sent in. The rx they have on file is an old rx.  Advised [name] out of office this week. Will send request to [facility], Dr. Anne Hahn to review and see if he can send rx as requested.

## 2017-07-19 ENCOUNTER — Telehealth: Payer: Self-pay | Admitting: Neurology

## 2017-07-19 NOTE — Telephone Encounter (Signed)
Rx faxed and confirmed to [facility].

## 2017-07-19 NOTE — Telephone Encounter (Signed)
Tarish from [facility] called wanting to discuss the pts prescription. Tonye Pearsonarish can be reached at [phone]. He said he had [place]

## 2017-07-19 NOTE — Telephone Encounter (Signed)
Call [phone] regarding the pts medication dalfampridine (AMPYRA) 10 MG TB12 we cant receive fax until we speak with them

## 2017-07-19 NOTE — Telephone Encounter (Signed)
I called Lindsay Schneider back at the number provided. No answer, no option to leave a VM. Will try again another time. If Lindsay Schneider calls back, please ask the specific question that they need answered.

## 2017-07-20 NOTE — Telephone Encounter (Signed)
Called Lindsay Schneider at [facility] and they have received the script and are processing it through. He states that everything is fine and appreciated the call back

## 2017-07-23 DIAGNOSIS — Z5181 Encounter for therapeutic drug level monitoring: Secondary | ICD-10-CM | POA: Diagnosis not present

## 2017-07-26 ENCOUNTER — Encounter: Payer: Self-pay | Admitting: *Deleted

## 2017-07-27 ENCOUNTER — Encounter (HOSPITAL_COMMUNITY): Payer: Self-pay

## 2017-07-27 ENCOUNTER — Ambulatory Visit (HOSPITAL_COMMUNITY)
Admission: RE | Admit: 2017-07-27 | Discharge: 2017-07-27 | Disposition: A | Discharge status: Home / Self Care | Payer: Medicare Other | Source: Ambulatory Visit | Attending: Neurology | Admitting: Neurology

## 2017-07-27 DIAGNOSIS — G35 Multiple sclerosis: Secondary | ICD-10-CM | POA: Diagnosis not present

## 2017-07-27 MED ORDER — SODIUM CHLORIDE 0.9 % IV SOLN
300.0000 mg | INTRAVENOUS | Status: DC
  Administered 2017-07-27: 300 mg via INTRAVENOUS
  Filled 2017-07-27: qty 15

## 2017-07-27 MED ORDER — SODIUM CHLORIDE 0.9 % IV SOLN
INTRAVENOUS | Status: DC
  Administered 2017-07-27: 10:00:00 via INTRAVENOUS

## 2017-07-27 NOTE — Discharge Instructions (Signed)
Natalizumab injection/Tysabri Infusion  °What is this medicine? °NATALIZUMAB (na ta LIZ you mab) is used to treat relapsing multiple sclerosis. This drug is not a cure. It is also used to treat Crohn's disease. °This medicine may be used for other purposes; ask your health care provider or pharmacist if you have questions. °COMMON BRAND NAME(S): Tysabri °What should I tell my health care provider before I take this medicine? °They need to know if you have any of these conditions: °-immune system problems °-progressive multifocal leukoencephalopathy (PML) °-an unusual or allergic reaction to natalizumab, other medicines, foods, dyes, or preservatives °-pregnant or trying to get pregnant °-breast-feeding °How should I use this medicine? °This medicine is for infusion into a vein. It is given by a health care professional in a hospital or clinic setting. °A special MedGuide will be given to you by the pharmacist with each prescription and refill. Be sure to read this information carefully each time. °Talk to your pediatrician regarding the use of this medicine in children. This medicine is not approved for use in children. °Overdosage: If you think you have taken too much of this medicine contact a poison control center or emergency room at once. °NOTE: This medicine is only for you. Do not share this medicine with others. °What if I miss a dose? °It is important not to miss your dose. Call your doctor or health care professional if you are unable to keep an appointment. °What may interact with this medicine? °-azathioprine °-cyclosporine °-interferon °-6-mercaptopurine °-methotrexate °-steroid medicines like prednisone or cortisone °-TNF-alpha inhibitors like adalimumab, etanercept, and infliximab °-vaccines °This list may not describe all possible interactions. Give your health care provider a list of all the medicines, herbs, non-prescription drugs, or dietary supplements you use. Also tell them if you smoke, drink  alcohol, or use illegal drugs. Some items may interact with your medicine. °What should I watch for while using this medicine? °Your condition will be monitored carefully while you are receiving this medicine. Visit your doctor for regular check ups. Tell your doctor or healthcare professional if your symptoms do not start to get better or if they get worse. °Stay away from people who are sick. Call your doctor or health care professional for advice if you get a fever, chills or sore throat, or other symptoms of a cold or flu. Do not treat yourself. °In some patients, this medicine may cause a serious brain infection that may cause death. If you have any problems seeing, thinking, speaking, walking, or standing, tell your doctor right away. If you cannot reach your doctor, get urgent medical care. °What side effects may I notice from receiving this medicine? °Side effects that you should report to your doctor or health care professional as soon as possible: °-allergic reactions like skin rash, itching or hives, swelling of the face, lips, or tongue °-breathing problems °-changes in vision °-chest pain °-dark urine °-depression, feelings of sadness °-dizziness °-general ill feeling or flu-like symptoms °-irregular, missed, or painful menstrual periods °-light-colored stools °-loss of appetite, nausea °-muscle weakness °-problems with balance, talking, or walking °-right upper belly pain °-unusually weak or tired °-yellowing of the eyes or skin °Side effects that usually do not require medical attention (report to your doctor or health care professional if they continue or are bothersome): °-aches, pains °-headache °-stomach upset °-tiredness °This list may not describe all possible side effects. Call your doctor for medical advice about side effects. You may report side effects to FDA at 1-800-FDA-1088. °Where should   I keep my medicine? °This drug is given in a hospital or clinic and will not be stored at  home. °NOTE: This sheet is a summary. It may not cover all possible information. If you have questions about this medicine, talk to your doctor, pharmacist, or health care provider. °© 2018 Elsevier/Gold Standard (2008-08-29 13:33:21) ° °

## 2017-07-27 NOTE — Progress Notes (Signed)
Pt presented today for Tysabri with quarter size, raised skin lesions that she says itch and started over a month ago. Lesions are circular and dry. They are visible on her right arm and back.  She states they are on her hips and torso.  Consulted Dr Epimenio Foot thru [facility] prior to infusion.  Dr Epimenio Foot encourages Pt to follow up with primary care MD and is okay to proceed with infusion today.

## 2017-07-30 DIAGNOSIS — R21 Rash and other nonspecific skin eruption: Secondary | ICD-10-CM | POA: Diagnosis not present

## 2017-07-30 DIAGNOSIS — R5383 Other fatigue: Secondary | ICD-10-CM | POA: Diagnosis not present

## 2017-07-31 DIAGNOSIS — Z5181 Encounter for therapeutic drug level monitoring: Secondary | ICD-10-CM | POA: Diagnosis not present

## 2017-08-07 DIAGNOSIS — Z5181 Encounter for therapeutic drug level monitoring: Secondary | ICD-10-CM | POA: Diagnosis not present

## 2017-08-14 DIAGNOSIS — Z5181 Encounter for therapeutic drug level monitoring: Secondary | ICD-10-CM | POA: Diagnosis not present

## 2017-08-20 DIAGNOSIS — Z5181 Encounter for therapeutic drug level monitoring: Secondary | ICD-10-CM | POA: Diagnosis not present

## 2017-08-22 DIAGNOSIS — Z5181 Encounter for therapeutic drug level monitoring: Secondary | ICD-10-CM | POA: Diagnosis not present

## 2017-08-24 ENCOUNTER — Ambulatory Visit (HOSPITAL_COMMUNITY)
Admission: RE | Admit: 2017-08-24 | Discharge: 2017-08-24 | Disposition: A | Discharge status: Home / Self Care | Payer: Medicare Other | Source: Ambulatory Visit | Attending: Neurology | Admitting: Neurology

## 2017-08-24 ENCOUNTER — Encounter (HOSPITAL_COMMUNITY): Payer: Self-pay

## 2017-08-24 DIAGNOSIS — G35 Multiple sclerosis: Secondary | ICD-10-CM | POA: Diagnosis not present

## 2017-08-24 DIAGNOSIS — Z993 Dependence on wheelchair: Secondary | ICD-10-CM | POA: Diagnosis not present

## 2017-08-24 MED ORDER — SODIUM CHLORIDE 0.9 % IV SOLN
300.0000 mg | INTRAVENOUS | Status: DC
  Administered 2017-08-24: 300 mg via INTRAVENOUS
  Filled 2017-08-24: qty 15

## 2017-08-24 MED ORDER — SODIUM CHLORIDE 0.9 % IV SOLN
INTRAVENOUS | Status: DC
Start: 2017-08-24 — End: 2017-08-25
  Administered 2017-08-24: 11:00:00 via INTRAVENOUS

## 2017-08-24 NOTE — Discharge Instructions (Signed)
Natalizumab injection/Tysabri Infusion  °What is this medicine? °NATALIZUMAB (na ta LIZ you mab) is used to treat relapsing multiple sclerosis. This drug is not a cure. It is also used to treat Crohn's disease. °This medicine may be used for other purposes; ask your health care provider or pharmacist if you have questions. °COMMON BRAND NAME(S): Tysabri °What should I tell my health care provider before I take this medicine? °They need to know if you have any of these conditions: °-immune system problems °-progressive multifocal leukoencephalopathy (PML) °-an unusual or allergic reaction to natalizumab, other medicines, foods, dyes, or preservatives °-pregnant or trying to get pregnant °-breast-feeding °How should I use this medicine? °This medicine is for infusion into a vein. It is given by a health care professional in a hospital or clinic setting. °A special MedGuide will be given to you by the pharmacist with each prescription and refill. Be sure to read this information carefully each time. °Talk to your pediatrician regarding the use of this medicine in children. This medicine is not approved for use in children. °Overdosage: If you think you have taken too much of this medicine contact a poison control center or emergency room at once. °NOTE: This medicine is only for you. Do not share this medicine with others. °What if I miss a dose? °It is important not to miss your dose. Call your doctor or health care professional if you are unable to keep an appointment. °What may interact with this medicine? °-azathioprine °-cyclosporine °-interferon °-6-mercaptopurine °-methotrexate °-steroid medicines like prednisone or cortisone °-TNF-alpha inhibitors like adalimumab, etanercept, and infliximab °-vaccines °This list may not describe all possible interactions. Give your health care provider a list of all the medicines, herbs, non-prescription drugs, or dietary supplements you use. Also tell them if you smoke, drink  alcohol, or use illegal drugs. Some items may interact with your medicine. °What should I watch for while using this medicine? °Your condition will be monitored carefully while you are receiving this medicine. Visit your doctor for regular check ups. Tell your doctor or healthcare professional if your symptoms do not start to get better or if they get worse. °Stay away from people who are sick. Call your doctor or health care professional for advice if you get a fever, chills or sore throat, or other symptoms of a cold or flu. Do not treat yourself. °In some patients, this medicine may cause a serious brain infection that may cause death. If you have any problems seeing, thinking, speaking, walking, or standing, tell your doctor right away. If you cannot reach your doctor, get urgent medical care. °What side effects may I notice from receiving this medicine? °Side effects that you should report to your doctor or health care professional as soon as possible: °-allergic reactions like skin rash, itching or hives, swelling of the face, lips, or tongue °-breathing problems °-changes in vision °-chest pain °-dark urine °-depression, feelings of sadness °-dizziness °-general ill feeling or flu-like symptoms °-irregular, missed, or painful menstrual periods °-light-colored stools °-loss of appetite, nausea °-muscle weakness °-problems with balance, talking, or walking °-right upper belly pain °-unusually weak or tired °-yellowing of the eyes or skin °Side effects that usually do not require medical attention (report to your doctor or health care professional if they continue or are bothersome): °-aches, pains °-headache °-stomach upset °-tiredness °This list may not describe all possible side effects. Call your doctor for medical advice about side effects. You may report side effects to FDA at 1-800-FDA-1088. °Where should   I keep my medicine? °This drug is given in a hospital or clinic and will not be stored at  home. °NOTE: This sheet is a summary. It may not cover all possible information. If you have questions about this medicine, talk to your doctor, pharmacist, or health care provider. °© 2018 Elsevier/Gold Standard (2008-08-29 13:33:21) ° °

## 2017-08-27 DIAGNOSIS — Z5181 Encounter for therapeutic drug level monitoring: Secondary | ICD-10-CM | POA: Diagnosis not present

## 2017-08-27 DIAGNOSIS — R21 Rash and other nonspecific skin eruption: Secondary | ICD-10-CM | POA: Diagnosis not present

## 2017-08-28 ENCOUNTER — Ambulatory Visit (INDEPENDENT_AMBULATORY_CARE_PROVIDER_SITE_OTHER): Payer: Medicare Other | Admitting: Neurology

## 2017-08-28 ENCOUNTER — Other Ambulatory Visit: Payer: Self-pay

## 2017-08-28 ENCOUNTER — Encounter: Payer: Self-pay | Admitting: Neurology

## 2017-08-28 VITALS — BP 127/76 | HR 81 | Resp 18 | Ht 63.0 in | Wt 153.0 lb

## 2017-08-28 DIAGNOSIS — R21 Rash and other nonspecific skin eruption: Secondary | ICD-10-CM | POA: Insufficient documentation

## 2017-08-28 DIAGNOSIS — R29898 Other symptoms and signs involving the musculoskeletal system: Secondary | ICD-10-CM

## 2017-08-28 DIAGNOSIS — R5383 Other fatigue: Secondary | ICD-10-CM

## 2017-08-28 DIAGNOSIS — G35 Multiple sclerosis: Secondary | ICD-10-CM | POA: Diagnosis not present

## 2017-08-28 DIAGNOSIS — G8114 Spastic hemiplegia affecting left nondominant side: Secondary | ICD-10-CM

## 2017-08-28 MED ORDER — PHENTERMINE HCL 37.5 MG PO TABS: 37.5000 mg | ORAL_TABLET | Freq: Every morning | ORAL | 5 refills | Status: DC

## 2017-08-28 MED ORDER — MELOXICAM 7.5 MG PO TABS: 7.5000 mg | ORAL_TABLET | Freq: Every day | ORAL | 11 refills | Status: DC

## 2017-08-28 MED ORDER — BACLOFEN 10 MG PO TABS: 10.0000 mg | ORAL_TABLET | Freq: Three times a day (TID) | ORAL | 11 refills | Status: DC

## 2017-08-28 MED ORDER — DALFAMPRIDINE ER 10 MG PO TB12: 10.0000 mg | ORAL_TABLET | Freq: Two times a day (BID) | ORAL | 3 refills | Status: DC

## 2017-08-28 NOTE — Progress Notes (Signed)
[facility]  PATIENT: Lindsay Schneider DOB: [birth year]  REFERRING DOCTOR OR PCP:   SOURCE:   _________________________________   HISTORICAL  CHIEF COMPLAINT:  Chief Complaint  Patient presents with  . Multiple Sclerosis    Sts. she continues to tolerate Tysabri well.  JCV ab last checked 08/31/17 (0.21).  Has been breaking out in large circular rash (generalized).  Saw dermatology yesterday and had a skin bx/fim    HISTORY OF PRESENT ILLNESS:  Lindsay Schneider is a 32 year old woman who was diagnosed with MS in 2011 after presenting with left sided arm and leg numbness and weakness.     Update 08/28/2017: She has no new MS exacerbations and impairments are stable.  She tolerates Tysabri well.  She actually has done slightly better this year.   She can dress independently now and was requiring help to get pants on.   She has severe leg weakness, moderate left arm weakness, all with spasticity.   Her right arm is strong      She has a rash on her arms progressing over the past couple weeks.   She has dark roundish spots from head to legs.   She had a biopsy at [facility] yesterday.    No treatments were provided yet.     From 12/27/2016: MS:   She is on Tysabri. Her JCV Ab titers have been negative.   She tolerates Tysabri well.  She has had no definite exacerbation.       She has been on Tysabri for about 18 months but had been on it previously as well.  Gait/strength/sensation: She has had more weakness in her legs, left worse than right.   Her left arm is weak but right arm is strong.    She is whheelchair bound and has needed more help with transfers.    She also has left side spasticity.   Handwriting is mildly better (right hand) but she tires out and her hand shakes when that occurs.     She takes baclofen 90 mg a day and tizanidine only once a day .   Higher dose was not well tolerated    Sometimes, she hgets severe right leg spasms where the leg locks up.    This mostly occurs when she is being moved.     Bladder:   She has difficulty with her bladder. She has urinary urgency and frequency and will have occasional incontinence if she cannot get to the bathroom and transfer in time.  Oxybutynin only helpe a little bit so she stopped.     Vision:   She denies any difficulty with her vision.  Vision is blurry in the mornings.   .    Fatigue:   She has physical fatigue.   Her fatigue improved when phentermine was started.   She also has been able to lose a couple pounds.  She is sleeping ok most nights on tizanidine/baclofen combo.    Mood/cognition:   Mood is ok with only occasional mild depression and no anxiety.     She denies any major difficulties with cognitive function. She has difficulty focusing but is doing better with phentermine.  Left hip pain:   She has more pain in the left hip.    MS History:  In 2011, onset of left arm and leg numbness and weakness.  Her symptoms develop over 1 day. She had an MRI performed with lesions consistent with MS. She started to see Dr. Terrace Arabia here  at [facility]. She was placed on Rebif. However, she stopped because her legs felt weaker when she was on it. She then transferred her care to Dr. Renne Crigler at [facility].     She was started on Tysabri.  She had several infusions of Tysabri but transportation was difficult to get to [facility]. Therefore, Dr. Renne Crigler switched her to Rituxan although she tolerated the medication well. She had a significant relapse with bilateral leg weakness within a couple weeks of the infusion. Therefore, 6 months later, she started Tysabri again. Due to difficulties with transportation, she transferred care to [place] in October 2016.   REVIEW OF SYSTEMS: Constitutional: No fevers, chills, sweats, or change in appetite.   She has fatigue and poor sleep.    Eyes: No visual changes, double vision, eye pain Ear, nose and throat: No hearing loss, ear pain, nasal congestion, sore throat Cardiovascular: No chest  pain, palpitations Respiratory: No shortness of breath at rest or with exertion.   No wheezes GastrointestinaI: No nausea, vomiting, diarrhea, abdominal pain, fecal incontinence Genitourinary: Shehas urinary urgency and frequency with occ incontinence.    Hasnocturia. Musculoskeletal: No neck pain, back pain Integumentary: No rash, pruritus, skin lesions Neurological: as above Psychiatric:   Some depression at this time.  No anxiety Endocrine: No palpitations, diaphoresis, change in appetite, change in weigh or increased thirst Hematologic/Lymphatic: No anemia, purpura, petechiae. Allergic/Immunologic: No itchy/runny eyes, nasal congestion, recent allergic reactions, rashes  ALLERGIES: No Known Allergies  HOME MEDICATIONS:  Current Outpatient Medications:  .  baclofen (LIORESAL) 10 MG tablet, Take 1 tablet (10 mg total) by mouth 3 (three) times daily., Disp: 90 tablet, Rfl: 11 .  dalfampridine (AMPYRA) 10 MG TB12, Take 1 tablet (10 mg total) by mouth 2 (two) times daily., Disp: 180 tablet, Rfl: 3 .  meloxicam (MOBIC) 7.5 MG tablet, Take 1 tablet (7.5 mg total) by mouth daily., Disp: 30 tablet, Rfl: 11 .  natalizumab (TYSABRI) 300 MG/15ML injection, Inject 300 mg into the vein every 30 (thirty) days., Disp: , Rfl:  .  phentermine (ADIPEX-P) 37.5 MG tablet, Take 1 tablet (37.5 mg total) by mouth every morning., Disp: 30 tablet, Rfl: 5  PAST MEDICAL HISTORY: Past Medical History:  Diagnosis Date  . Movement disorder   . MS (multiple sclerosis) (HCC)     PAST SURGICAL HISTORY: Past Surgical History:  Procedure Laterality Date  . CESAREAN SECTION      FAMILY HISTORY: Family History  Problem Relation Age of Onset  . Asthma Mother   . Hypertension Father   . Healthy Sister   . Healthy Brother     SOCIAL HISTORY:  Social History   Socioeconomic History  . Marital status: Single    Spouse name: Not on file  . Number of children: Not on file  . Years of education: Not  on file  . Highest education level: Not on file  Social Needs  . Financial resource strain: Not on file  . Food insecurity - worry: Not on file  . Food insecurity - inability: Not on file  . Transportation needs - medical: Not on file  . Transportation needs - non-medical: Not on file  Occupational History  . Not on file  Tobacco Use  . Smoking status: Never Smoker  . Smokeless tobacco: Never Used  Substance and Sexual Activity  . Alcohol use: No  . Drug use: No  . Sexual activity: Not Currently    Birth control/protection: Injection  Other Topics Concern  . Not  on file  Social History Narrative  . Not on file     PHYSICAL EXAM  Vitals:   08/28/17 1117  BP: 127/76  Pulse: 81  Resp: 18  Weight: 153 lb (69.4 kg)  Height: 5\' 3"  (1.6 m)    Body mass index is 27.1 kg/m.   General: The patient is well-developed and well-nourished and in no acute distress  Skin:   Hyperpigmented rash on the arms, legs and trunk.  Neurologic Exam  Mental status: The patient is alert and oriented x 3 at the time of the examination. The patient has apparent normal recent and remote memory, with an apparently normal attention span and concentration ability.   Speech is normal.  Cranial nerves: Extraocular movements are full.. Facial strength and sensation is normal. Trapezius strength is normal.  The tongue is midline, and the patient has symmetric elevation of the soft palate. No obvious hearing deficits are noted.  Motor:  Muscle bulk is inormal and tone is greatly increased in her legs (L>R) and the left arm.   Right arm with good tone/strength.  Strength is 3 to 4-/5 in the left distal arm and 2-3 in the proximal arm. Strength is 2+ in proximal right leg and 2- in proximal left leg and 4- in right knee extension and 3 in left knee extension , 2+ right and  1 left ankle extensors,    Sensory: Intact sensation to touch and vibration in the arms. Reduced vibration sensation in the left  leg.  Coordination: Cerebellar testing reveals poor left finger-nose-finger and she can't do heel-to-shin bilaterally.  Gait and station:  She is unable to stand without report and cannot walk.      Reflexes: Deep tendon reflexes are increased in arms, left > right and in legs with spread att knees and sustained clonus at the ankles.       DIAGNOSTIC DATA (LABS, IMAGING, TESTING) - I reviewed patient records, labs, notes, testing and imaging myself where available.  Lab Results  Component Value Date   WBC 8.4 08/30/2016   HGB 11.8 08/30/2016   HCT 35.9 08/30/2016   MCV 81 08/30/2016   PLT 226 08/30/2016      Component Value Date/Time   NA 138 03/05/2012 1805   K 3.6 03/05/2012 1805   CL 105 03/05/2012 1805   CO2 21 03/05/2012 1805   GLUCOSE 96 03/05/2012 1805   BUN 7 03/05/2012 1805   CREATININE 0.58 03/05/2012 1805   CALCIUM 9.6 03/05/2012 1805   PROT 7.6 03/05/2012 1805   ALBUMIN 4.7 03/05/2012 1805   AST 14 03/05/2012 1805   ALT 9 03/05/2012 1805   ALKPHOS 76 03/05/2012 1805   BILITOT 0.7 03/05/2012 1805   GFRNONAA >90 03/05/2012 1805   GFRAA >90 03/05/2012 1805       ASSESSMENT AND PLAN  Multiple sclerosis (HCC) - Plan: Stratify JCV Antibody Test ([facility]), CBC with Differential/Platelet  Bilateral leg weakness  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    Continue Tysabri. We will check the JCV antibody today.  2.    She will follow up with dermatology for her recent onset rash. I do not think it is related to any other medications that we are writing.  3.    Continue Phentermine for fatigue, attention/focusweight loss.   Renew other medications 4.    Return in 6 months or sooner if there are new or worsening neurologic  symptoms.    Sohan Potvin A. Epimenio Foot, MD, PhD 08/28/2017, 6:58 PM Certified in Neurology, Clinical Neurophysiology, Sleep Medicine, Pain Medicine and  Neuroimaging  [facility] [address] [place], [place] [address] [phone]

## 2017-08-29 DIAGNOSIS — Z5181 Encounter for therapeutic drug level monitoring: Secondary | ICD-10-CM | POA: Diagnosis not present

## 2017-08-29 LAB — CBC WITH DIFFERENTIAL/PLATELET
BASOS: 0 %
Basophils Absolute: 0 10*3/uL (ref 0.0–0.2)
EOS (ABSOLUTE): 0.1 10*3/uL (ref 0.0–0.4)
EOS: 1 %
HEMATOCRIT: 37.7 % (ref 34.0–46.6)
HEMOGLOBIN: 11.8 g/dL (ref 11.1–15.9)
Immature Grans (Abs): 0 10*3/uL (ref 0.0–0.1)
Immature Granulocytes: 0 %
LYMPHS ABS: 3.2 10*3/uL — AB (ref 0.7–3.1)
Lymphs: 33 %
MCH: 27.3 pg (ref 26.6–33.0)
MCHC: 31.3 g/dL — AB (ref 31.5–35.7)
MCV: 87 fL (ref 79–97)
Monocytes Absolute: 0.7 10*3/uL (ref 0.1–0.9)
Monocytes: 7 %
NEUTROS ABS: 5.7 10*3/uL (ref 1.4–7.0)
Neutrophils: 59 %
Platelets: 214 10*3/uL (ref 150–379)
RBC: 4.33 x10E6/uL (ref 3.77–5.28)
RDW: 14.8 % (ref 12.3–15.4)
WBC: 9.7 10*3/uL (ref 3.4–10.8)

## 2017-09-04 DIAGNOSIS — Z5181 Encounter for therapeutic drug level monitoring: Secondary | ICD-10-CM | POA: Diagnosis not present

## 2017-09-04 DIAGNOSIS — L308 Other specified dermatitis: Secondary | ICD-10-CM | POA: Diagnosis not present

## 2017-09-05 ENCOUNTER — Encounter: Payer: Self-pay | Admitting: *Deleted

## 2017-09-06 DIAGNOSIS — Z4802 Encounter for removal of sutures: Secondary | ICD-10-CM | POA: Diagnosis not present

## 2017-09-06 DIAGNOSIS — L308 Other specified dermatitis: Secondary | ICD-10-CM | POA: Diagnosis not present

## 2017-09-06 DIAGNOSIS — Z5181 Encounter for therapeutic drug level monitoring: Secondary | ICD-10-CM | POA: Diagnosis not present

## 2017-09-10 DIAGNOSIS — Z5181 Encounter for therapeutic drug level monitoring: Secondary | ICD-10-CM | POA: Diagnosis not present

## 2017-09-12 DIAGNOSIS — Z5181 Encounter for therapeutic drug level monitoring: Secondary | ICD-10-CM | POA: Diagnosis not present

## 2017-09-17 DIAGNOSIS — Z5181 Encounter for therapeutic drug level monitoring: Secondary | ICD-10-CM | POA: Diagnosis not present

## 2017-09-19 DIAGNOSIS — Z5181 Encounter for therapeutic drug level monitoring: Secondary | ICD-10-CM | POA: Diagnosis not present

## 2017-09-24 DIAGNOSIS — Z5181 Encounter for therapeutic drug level monitoring: Secondary | ICD-10-CM | POA: Diagnosis not present

## 2017-09-26 ENCOUNTER — Encounter (HOSPITAL_COMMUNITY): Payer: Medicare Other

## 2017-09-26 DIAGNOSIS — Z5181 Encounter for therapeutic drug level monitoring: Secondary | ICD-10-CM | POA: Diagnosis not present

## 2017-10-02 DIAGNOSIS — Z5181 Encounter for therapeutic drug level monitoring: Secondary | ICD-10-CM | POA: Diagnosis not present

## 2017-10-04 DIAGNOSIS — Z5181 Encounter for therapeutic drug level monitoring: Secondary | ICD-10-CM | POA: Diagnosis not present

## 2017-10-10 DIAGNOSIS — Z5181 Encounter for therapeutic drug level monitoring: Secondary | ICD-10-CM | POA: Diagnosis not present

## 2017-10-12 DIAGNOSIS — Z5181 Encounter for therapeutic drug level monitoring: Secondary | ICD-10-CM | POA: Diagnosis not present

## 2017-10-15 DIAGNOSIS — Z5181 Encounter for therapeutic drug level monitoring: Secondary | ICD-10-CM | POA: Diagnosis not present

## 2017-10-17 DIAGNOSIS — Z5181 Encounter for therapeutic drug level monitoring: Secondary | ICD-10-CM | POA: Diagnosis not present

## 2017-10-24 ENCOUNTER — Encounter (HOSPITAL_COMMUNITY)
Admission: RE | Admit: 2017-10-24 | Discharge: 2017-10-24 | Disposition: A | Discharge status: Home / Self Care | Payer: Medicare Other | Source: Ambulatory Visit | Attending: Neurology | Admitting: Neurology

## 2017-10-24 ENCOUNTER — Encounter (HOSPITAL_COMMUNITY): Payer: Self-pay

## 2017-10-24 DIAGNOSIS — G35 Multiple sclerosis: Secondary | ICD-10-CM | POA: Diagnosis not present

## 2017-10-24 MED ORDER — SODIUM CHLORIDE 0.9 % IV SOLN
300.0000 mg | INTRAVENOUS | Status: DC
  Administered 2017-10-24: 300 mg via INTRAVENOUS
  Filled 2017-10-24: qty 15

## 2017-10-24 MED ORDER — SODIUM CHLORIDE 0.9 % IV SOLN
INTRAVENOUS | Status: DC
  Administered 2017-10-24: 10:00:00 via INTRAVENOUS

## 2017-10-24 NOTE — Progress Notes (Signed)
Tolerated infusion well, stayed 30 minutes of recommended post infusion observation time.

## 2017-11-06 DIAGNOSIS — L308 Other specified dermatitis: Secondary | ICD-10-CM | POA: Diagnosis not present

## 2017-11-06 DIAGNOSIS — L810 Postinflammatory hyperpigmentation: Secondary | ICD-10-CM | POA: Diagnosis not present

## 2017-11-15 ENCOUNTER — Telehealth: Payer: Self-pay | Admitting: Neurology

## 2017-11-15 DIAGNOSIS — G35 Multiple sclerosis: Secondary | ICD-10-CM

## 2017-11-15 DIAGNOSIS — R269 Unspecified abnormalities of gait and mobility: Secondary | ICD-10-CM

## 2017-11-15 NOTE — Telephone Encounter (Signed)
DME order for beside commode faxed to [facility] as requested/fim

## 2017-11-15 NOTE — Telephone Encounter (Signed)
Pt called said her bedside toilet broke. She need rx sent to [facility]. Thank you

## 2017-11-21 ENCOUNTER — Encounter (HOSPITAL_COMMUNITY): Payer: Self-pay

## 2017-11-21 ENCOUNTER — Encounter (HOSPITAL_COMMUNITY)
Admission: RE | Admit: 2017-11-21 | Discharge: 2017-11-21 | Disposition: A | Discharge status: Home / Self Care | Payer: Medicare Other | Source: Ambulatory Visit | Attending: Neurology | Admitting: Neurology

## 2017-11-21 DIAGNOSIS — G35 Multiple sclerosis: Secondary | ICD-10-CM | POA: Insufficient documentation

## 2017-11-21 MED ORDER — SODIUM CHLORIDE 0.9 % IV SOLN
INTRAVENOUS | Status: DC
  Administered 2017-11-21: 11:00:00 via INTRAVENOUS

## 2017-11-21 MED ORDER — SODIUM CHLORIDE 0.9 % IV SOLN
300.0000 mg | INTRAVENOUS | Status: DC
  Administered 2017-11-21: 300 mg via INTRAVENOUS
  Filled 2017-11-21: qty 15

## 2017-11-21 NOTE — Progress Notes (Signed)
Stayed thirty minutes post Tysabri infusion (out of recommended 1 hour). Accompanied by daughter. Aware to call MD for any concerns.

## 2017-12-07 DIAGNOSIS — L308 Other specified dermatitis: Secondary | ICD-10-CM | POA: Diagnosis not present

## 2017-12-19 ENCOUNTER — Encounter (HOSPITAL_COMMUNITY): Payer: Medicare Other

## 2018-01-03 ENCOUNTER — Telehealth: Payer: Self-pay | Admitting: Neurology

## 2018-01-03 NOTE — Telephone Encounter (Signed)
Pt requesting a call, stating she missed her infusion treatment for this month and would like to come in for another at a different time this month. Please call to advise

## 2018-01-03 NOTE — Telephone Encounter (Signed)
Spoke with Raquel Sarna and explained she needs to call [facility] short stay to r/s her Tysabri infusion. She verbalized understanding of same, sts. she has their phone#/fim

## 2018-01-14 ENCOUNTER — Encounter: Payer: Self-pay | Admitting: *Deleted

## 2018-01-22 ENCOUNTER — Encounter (HOSPITAL_COMMUNITY)
Admission: RE | Admit: 2018-01-22 | Discharge: 2018-01-22 | Disposition: A | Discharge status: Home / Self Care | Payer: Medicare Other | Source: Ambulatory Visit | Attending: Neurology | Admitting: Neurology

## 2018-01-22 ENCOUNTER — Encounter (HOSPITAL_COMMUNITY): Payer: Self-pay

## 2018-01-22 DIAGNOSIS — G35 Multiple sclerosis: Secondary | ICD-10-CM | POA: Diagnosis not present

## 2018-01-22 MED ORDER — NATALIZUMAB 300 MG/15ML IV CONC
300.0000 mg | INTRAVENOUS | Status: DC
  Administered 2018-01-22: 300 mg via INTRAVENOUS
  Filled 2018-01-22: qty 15

## 2018-01-22 MED ORDER — SODIUM CHLORIDE 0.9 % IV SOLN
Freq: Once | INTRAVENOUS | Status: AC
  Administered 2018-01-22: 10:00:00 via INTRAVENOUS

## 2018-01-22 NOTE — Progress Notes (Signed)
Pt stayed approx 45 mins post Tysabri infusion from the recommended 1 hour / Touch. Pt did well with Tysabri infusion an aware to contact MD office if any complications arise.

## 2018-01-22 NOTE — Discharge Instructions (Signed)

## 2018-01-25 DIAGNOSIS — L308 Other specified dermatitis: Secondary | ICD-10-CM | POA: Diagnosis not present

## 2018-01-31 ENCOUNTER — Other Ambulatory Visit: Payer: Self-pay | Admitting: Neurology

## 2018-01-31 NOTE — Telephone Encounter (Signed)
Rx registry checked. Last fill date is 12/24/17 for #30. Last OV was 08/28/17 and next OV is 02/26/18.

## 2018-02-01 NOTE — Telephone Encounter (Signed)
Rx sent electronically.  

## 2018-02-11 ENCOUNTER — Other Ambulatory Visit: Payer: Self-pay

## 2018-02-11 ENCOUNTER — Other Ambulatory Visit: Payer: Self-pay | Admitting: Podiatry

## 2018-02-11 ENCOUNTER — Encounter: Payer: Self-pay | Admitting: Podiatry

## 2018-02-11 ENCOUNTER — Ambulatory Visit: Payer: Medicare Other

## 2018-02-11 ENCOUNTER — Ambulatory Visit (INDEPENDENT_AMBULATORY_CARE_PROVIDER_SITE_OTHER): Payer: Medicare Other | Admitting: Podiatry

## 2018-02-11 VITALS — BP 114/88 | HR 75

## 2018-02-11 DIAGNOSIS — B351 Tinea unguium: Secondary | ICD-10-CM

## 2018-02-11 DIAGNOSIS — M779 Enthesopathy, unspecified: Secondary | ICD-10-CM

## 2018-02-11 NOTE — Progress Notes (Signed)
Subjective:   Patient ID: Lindsay Schneider, female   DOB: 32 y.o.   MRN: [id number]   HPI Patient presents stating that she does have MS and she has feet and ankles but sometimes ache and swell and she is in a wheelchair and does not have use of the left side of her body   Review of Systems  All other systems reviewed and are negative.       Objective:  Physical Exam  Constitutional: She appears well-developed and well-nourished.  Cardiovascular: Intact distal pulses.  Pulmonary/Chest: Effort normal.  Musculoskeletal: Normal range of motion.  Neurological: She is alert.  Skin: Skin is warm.  Nursing note and vitals reviewed.   Neuro vascular status was found to be intact muscle strength adequate with patient found to have diminished muscle strength bilateral moderate inversion of both feet with a mycotic nail infection right.  Patient also has left foot drop noted and generalized weakness bilateral     Assessment:  Condition secondary to acute MS with instability of the lateral column with mycotic nail infection right big toe     Plan:  H&P all conditions discussed and today debridement of the nailbed accomplished with no iatrogenic bleeding.  I then discussed bracing but I do not recommend due to her instability in her hips and knees and I do not think these will be of benefit to her

## 2018-02-20 ENCOUNTER — Telehealth: Payer: Self-pay | Admitting: Neurology

## 2018-02-20 DIAGNOSIS — G35 Multiple sclerosis: Secondary | ICD-10-CM

## 2018-02-20 DIAGNOSIS — Z993 Dependence on wheelchair: Secondary | ICD-10-CM

## 2018-02-20 DIAGNOSIS — R269 Unspecified abnormalities of gait and mobility: Secondary | ICD-10-CM

## 2018-02-20 NOTE — Telephone Encounter (Signed)
The legs on the tub bench has come apart from the bench, she said it is broke and needs a new script sent to [facility]. Please call to advise

## 2018-02-20 NOTE — Telephone Encounter (Signed)
Order placed and message sent to [facility] making them aware to pull order.

## 2018-02-22 ENCOUNTER — Encounter (HOSPITAL_COMMUNITY)
Admission: RE | Admit: 2018-02-22 | Discharge: 2018-02-22 | Disposition: A | Discharge status: Home / Self Care | Payer: Medicare Other | Source: Ambulatory Visit | Attending: Neurology | Admitting: Neurology

## 2018-02-22 ENCOUNTER — Encounter (HOSPITAL_COMMUNITY): Payer: Self-pay

## 2018-02-22 DIAGNOSIS — G35 Multiple sclerosis: Secondary | ICD-10-CM | POA: Diagnosis not present

## 2018-02-22 MED ORDER — NATALIZUMAB 300 MG/15ML IV CONC
300.0000 mg | INTRAVENOUS | Status: DC
  Administered 2018-02-22: 300 mg via INTRAVENOUS
  Filled 2018-02-22: qty 15

## 2018-02-22 MED ORDER — SODIUM CHLORIDE 0.9 % IV SOLN
INTRAVENOUS | Status: DC
  Administered 2018-02-22: 11:00:00 via INTRAVENOUS

## 2018-02-22 NOTE — Discharge Instructions (Signed)

## 2018-02-22 NOTE — Telephone Encounter (Signed)
Per [facility]: "Unfortunately this pt's insurance will not cover another tub bench at this time. [name] ordered a tub bench and pt received it on 04/27/2017. Medicare does not cover tub benches and Medicaid will only cover a new one once every 3 years."  "She can purchase one from [place] private pay for $60.00 or she maybe able to find one online cheaper. "  [facility] has already reached out to the patient to make her aware of this and reports that patient is going to shop around.

## 2018-02-26 ENCOUNTER — Ambulatory Visit: Payer: Medicare Other | Admitting: Neurology

## 2018-03-08 DIAGNOSIS — L308 Other specified dermatitis: Secondary | ICD-10-CM | POA: Diagnosis not present

## 2018-03-19 ENCOUNTER — Other Ambulatory Visit (HOSPITAL_COMMUNITY): Payer: Self-pay | Admitting: General Practice

## 2018-03-29 ENCOUNTER — Encounter (HOSPITAL_COMMUNITY): Payer: Self-pay

## 2018-03-29 ENCOUNTER — Encounter (HOSPITAL_COMMUNITY)
Admission: RE | Admit: 2018-03-29 | Discharge: 2018-03-29 | Disposition: A | Discharge status: Home / Self Care | Payer: Medicare Other | Source: Ambulatory Visit | Attending: Neurology | Admitting: Neurology

## 2018-03-29 DIAGNOSIS — G35 Multiple sclerosis: Secondary | ICD-10-CM | POA: Diagnosis not present

## 2018-03-29 MED ORDER — SODIUM CHLORIDE 0.9 % IV SOLN
300.0000 mg | INTRAVENOUS | Status: DC
  Administered 2018-03-29: 300 mg via INTRAVENOUS
  Filled 2018-03-29: qty 15

## 2018-03-29 MED ORDER — SODIUM CHLORIDE 0.9 % IV SOLN
INTRAVENOUS | Status: DC
Start: 2018-03-29 — End: 2018-03-30
  Administered 2018-03-29: 09:00:00 via INTRAVENOUS

## 2018-03-29 NOTE — Progress Notes (Signed)
Patient stayed for 15 minute post Tysabri infusion observation. Instructed to call Dr. Bonnita Hollow office for problems/concerns. Ms. Lindsay Schneider verbalized understanding.

## 2018-03-29 NOTE — Discharge Instructions (Signed)

## 2018-04-04 ENCOUNTER — Ambulatory Visit: Payer: Medicare Other | Admitting: Neurology

## 2018-04-04 ENCOUNTER — Encounter: Payer: Self-pay | Admitting: Neurology

## 2018-04-30 ENCOUNTER — Encounter (HOSPITAL_COMMUNITY): Payer: Medicare Other

## 2018-05-13 ENCOUNTER — Encounter (HOSPITAL_COMMUNITY): Payer: Self-pay

## 2018-05-13 ENCOUNTER — Encounter (HOSPITAL_COMMUNITY)
Admission: RE | Admit: 2018-05-13 | Discharge: 2018-05-13 | Disposition: A | Discharge status: Home / Self Care | Payer: Medicare Other | Source: Ambulatory Visit | Attending: Neurology | Admitting: Neurology

## 2018-05-13 DIAGNOSIS — G35 Multiple sclerosis: Secondary | ICD-10-CM | POA: Insufficient documentation

## 2018-05-13 MED ORDER — SODIUM CHLORIDE 0.9 % IV SOLN
INTRAVENOUS | Status: DC
  Administered 2018-05-13: 12:00:00 via INTRAVENOUS

## 2018-05-13 MED ORDER — SODIUM CHLORIDE 0.9 % IV SOLN
300.0000 mg | INTRAVENOUS | Status: DC
  Administered 2018-05-13: 300 mg via INTRAVENOUS
  Filled 2018-05-13: qty 15

## 2018-05-31 ENCOUNTER — Encounter (HOSPITAL_COMMUNITY): Payer: Medicare Other

## 2018-06-07 DIAGNOSIS — L308 Other specified dermatitis: Secondary | ICD-10-CM | POA: Diagnosis not present

## 2018-06-07 DIAGNOSIS — L988 Other specified disorders of the skin and subcutaneous tissue: Secondary | ICD-10-CM | POA: Diagnosis not present

## 2018-06-12 ENCOUNTER — Encounter (HOSPITAL_COMMUNITY)
Admission: RE | Admit: 2018-06-12 | Discharge: 2018-06-12 | Disposition: A | Discharge status: Home / Self Care | Payer: Medicare Other | Source: Ambulatory Visit | Attending: Neurology | Admitting: Neurology

## 2018-06-12 ENCOUNTER — Encounter (HOSPITAL_COMMUNITY): Payer: Self-pay

## 2018-06-12 DIAGNOSIS — G35 Multiple sclerosis: Secondary | ICD-10-CM | POA: Insufficient documentation

## 2018-06-12 MED ORDER — SODIUM CHLORIDE 0.9 % IV SOLN
300.0000 mg | INTRAVENOUS | Status: DC
  Administered 2018-06-12: 300 mg via INTRAVENOUS
  Filled 2018-06-12: qty 15

## 2018-06-12 MED ORDER — SODIUM CHLORIDE 0.9 % IV SOLN
INTRAVENOUS | Status: DC
  Administered 2018-06-12: 10:00:00 via INTRAVENOUS

## 2018-06-13 ENCOUNTER — Other Ambulatory Visit (HOSPITAL_COMMUNITY): Payer: Self-pay | Admitting: General Practice

## 2018-06-13 ENCOUNTER — Telehealth: Payer: Self-pay | Admitting: *Deleted

## 2018-06-13 NOTE — Telephone Encounter (Signed)
Faxed new orders for Tysabri to [facility] outpt pharmacy. Received fax confirmation. Sent staff message to [facility] letting them know we faxed them.

## 2018-07-02 ENCOUNTER — Encounter (HOSPITAL_COMMUNITY): Payer: Medicare Other

## 2018-07-10 ENCOUNTER — Encounter (HOSPITAL_COMMUNITY): Payer: Self-pay

## 2018-07-10 ENCOUNTER — Encounter (HOSPITAL_COMMUNITY)
Admission: RE | Admit: 2018-07-10 | Discharge: 2018-07-10 | Disposition: A | Discharge status: Home / Self Care | Payer: Medicare Other | Source: Ambulatory Visit | Attending: Neurology | Admitting: Neurology

## 2018-07-10 DIAGNOSIS — A419 Sepsis, unspecified organism: Secondary | ICD-10-CM | POA: Diagnosis not present

## 2018-07-10 DIAGNOSIS — G35 Multiple sclerosis: Secondary | ICD-10-CM | POA: Diagnosis not present

## 2018-07-10 DIAGNOSIS — G8114 Spastic hemiplegia affecting left nondominant side: Secondary | ICD-10-CM | POA: Diagnosis not present

## 2018-07-10 DIAGNOSIS — R05 Cough: Secondary | ICD-10-CM | POA: Diagnosis not present

## 2018-07-10 DIAGNOSIS — R509 Fever, unspecified: Secondary | ICD-10-CM | POA: Diagnosis not present

## 2018-07-10 DIAGNOSIS — A4189 Other specified sepsis: Secondary | ICD-10-CM | POA: Diagnosis not present

## 2018-07-10 DIAGNOSIS — E876 Hypokalemia: Secondary | ICD-10-CM | POA: Diagnosis not present

## 2018-07-10 DIAGNOSIS — J101 Influenza due to other identified influenza virus with other respiratory manifestations: Secondary | ICD-10-CM | POA: Diagnosis not present

## 2018-07-10 DIAGNOSIS — J209 Acute bronchitis, unspecified: Secondary | ICD-10-CM | POA: Diagnosis not present

## 2018-07-10 DIAGNOSIS — R110 Nausea: Secondary | ICD-10-CM | POA: Diagnosis not present

## 2018-07-10 MED ORDER — SODIUM CHLORIDE 0.9 % IV SOLN
300.0000 mg | INTRAVENOUS | Status: DC
  Administered 2018-07-10: 300 mg via INTRAVENOUS
  Filled 2018-07-10 (×3): qty 15

## 2018-07-10 MED ORDER — SODIUM CHLORIDE 0.9 % IV SOLN
INTRAVENOUS | Status: DC
  Administered 2018-07-10: 10:00:00 via INTRAVENOUS

## 2018-07-10 NOTE — Discharge Instructions (Signed)
Natalizumab injection What is this medicine? NATALIZUMAB (na ta LIZ you mab) is used to treat relapsing multiple sclerosis. This drug is not a cure. It is also used to treat Crohn's disease. This medicine may be used for other purposes; ask your health care provider or pharmacist if you have questions. COMMON BRAND NAME(S): Tysabri What should I tell my health care provider before I take this medicine? They need to know if you have any of these conditions: -immune system problems -progressive multifocal leukoencephalopathy (PML) -an unusual or allergic reaction to natalizumab, other medicines, foods, dyes, or preservatives -pregnant or trying to get pregnant -breast-feeding How should I use this medicine? This medicine is for infusion into a vein. It is given by a health care professional in a hospital or clinic setting. A special MedGuide will be given to you by the pharmacist with each prescription and refill. Be sure to read this information carefully each time. Talk to your pediatrician regarding the use of this medicine in children. This medicine is not approved for use in children. Overdosage: If you think you have taken too much of this medicine contact a poison control center or emergency room at once. NOTE: This medicine is only for you. Do not share this medicine with others. What if I miss a dose? It is important not to miss your dose. Call your doctor or health care professional if you are unable to keep an appointment. What may interact with this medicine? -azathioprine -cyclosporine -interferon -6-mercaptopurine -methotrexate -steroid medicines like prednisone or cortisone -TNF-alpha inhibitors like adalimumab, etanercept, and infliximab -vaccines This list may not describe all possible interactions. Give your health care provider a list of all the medicines, herbs, non-prescription drugs, or dietary supplements you use. Also tell them if you smoke, drink alcohol, or use  illegal drugs. Some items may interact with your medicine. What should I watch for while using this medicine? Your condition will be monitored carefully while you are receiving this medicine. Visit your doctor for regular check ups. Tell your doctor or healthcare professional if your symptoms do not start to get better or if they get worse. Stay away from people who are sick. Call your doctor or health care professional for advice if you get a fever, chills or sore throat, or other symptoms of a cold or flu. Do not treat yourself. In some patients, this medicine may cause a serious brain infection that may cause death. If you have any problems seeing, thinking, speaking, walking, or standing, tell your doctor right away. If you cannot reach your doctor, get urgent medical care. What side effects may I notice from receiving this medicine? Side effects that you should report to your doctor or health care professional as soon as possible: -allergic reactions like skin rash, itching or hives, swelling of the face, lips, or tongue -breathing problems -changes in vision -chest pain -dark urine -depression, feelings of sadness -dizziness -general ill feeling or flu-like symptoms -irregular, missed, or painful menstrual periods -light-colored stools -loss of appetite, nausea -muscle weakness -problems with balance, talking, or walking -right upper belly pain -unusually weak or tired -yellowing of the eyes or skin Side effects that usually do not require medical attention (report to your doctor or health care professional if they continue or are bothersome): -aches, pains -headache -stomach upset -tiredness This list may not describe all possible side effects. Call your doctor for medical advice about side effects. You may report side effects to FDA at 1-800-FDA-1088. Where should I keep   my medicine? This drug is given in a hospital or clinic and will not be stored at home. NOTE: This sheet is  a summary. It may not cover all possible information. If you have questions about this medicine, talk to your doctor, pharmacist, or health care provider.  2019 Elsevier/Gold Standard (2008-08-29 13:33:21)  

## 2018-07-11 ENCOUNTER — Inpatient Hospital Stay (HOSPITAL_COMMUNITY)
Admission: EM | Admit: 2018-07-11 | Discharge: 2018-07-15 | DRG: 872 | Disposition: A | Discharge status: Home / Self Care | Payer: Medicare Other | Attending: Internal Medicine | Admitting: Internal Medicine

## 2018-07-11 ENCOUNTER — Emergency Department (HOSPITAL_COMMUNITY): Payer: Medicare Other

## 2018-07-11 ENCOUNTER — Encounter (HOSPITAL_COMMUNITY): Payer: Self-pay | Admitting: *Deleted

## 2018-07-11 DIAGNOSIS — M62838 Other muscle spasm: Secondary | ICD-10-CM | POA: Diagnosis present

## 2018-07-11 DIAGNOSIS — Z87440 Personal history of urinary (tract) infections: Secondary | ICD-10-CM

## 2018-07-11 DIAGNOSIS — Z79899 Other long term (current) drug therapy: Secondary | ICD-10-CM

## 2018-07-11 DIAGNOSIS — B9789 Other viral agents as the cause of diseases classified elsewhere: Secondary | ICD-10-CM | POA: Diagnosis not present

## 2018-07-11 DIAGNOSIS — Z8249 Family history of ischemic heart disease and other diseases of the circulatory system: Secondary | ICD-10-CM

## 2018-07-11 DIAGNOSIS — R000 Tachycardia, unspecified: Secondary | ICD-10-CM | POA: Diagnosis not present

## 2018-07-11 DIAGNOSIS — A419 Sepsis, unspecified organism: Secondary | ICD-10-CM | POA: Diagnosis not present

## 2018-07-11 DIAGNOSIS — J101 Influenza due to other identified influenza virus with other respiratory manifestations: Secondary | ICD-10-CM | POA: Diagnosis present

## 2018-07-11 DIAGNOSIS — R509 Fever, unspecified: Secondary | ICD-10-CM | POA: Diagnosis not present

## 2018-07-11 DIAGNOSIS — Z993 Dependence on wheelchair: Secondary | ICD-10-CM

## 2018-07-11 DIAGNOSIS — A4189 Other specified sepsis: Secondary | ICD-10-CM | POA: Diagnosis not present

## 2018-07-11 DIAGNOSIS — R110 Nausea: Secondary | ICD-10-CM | POA: Diagnosis not present

## 2018-07-11 DIAGNOSIS — I10 Essential (primary) hypertension: Secondary | ICD-10-CM | POA: Diagnosis not present

## 2018-07-11 DIAGNOSIS — G35 Multiple sclerosis: Secondary | ICD-10-CM | POA: Diagnosis present

## 2018-07-11 DIAGNOSIS — D72829 Elevated white blood cell count, unspecified: Secondary | ICD-10-CM | POA: Diagnosis not present

## 2018-07-11 DIAGNOSIS — G8114 Spastic hemiplegia affecting left nondominant side: Secondary | ICD-10-CM | POA: Diagnosis present

## 2018-07-11 DIAGNOSIS — J209 Acute bronchitis, unspecified: Secondary | ICD-10-CM | POA: Diagnosis present

## 2018-07-11 DIAGNOSIS — M21372 Foot drop, left foot: Secondary | ICD-10-CM | POA: Diagnosis present

## 2018-07-11 DIAGNOSIS — R52 Pain, unspecified: Secondary | ICD-10-CM | POA: Diagnosis not present

## 2018-07-11 DIAGNOSIS — R05 Cough: Secondary | ICD-10-CM | POA: Diagnosis not present

## 2018-07-11 DIAGNOSIS — E876 Hypokalemia: Secondary | ICD-10-CM | POA: Diagnosis present

## 2018-07-11 LAB — CBC WITH DIFFERENTIAL/PLATELET
Abs Immature Granulocytes: 0.06 10*3/uL (ref 0.00–0.07)
BASOS PCT: 0 %
Basophils Absolute: 0 10*3/uL (ref 0.0–0.1)
EOS ABS: 0.6 10*3/uL — AB (ref 0.0–0.5)
Eosinophils Relative: 5 %
HCT: 36.1 % (ref 36.0–46.0)
Hemoglobin: 11.8 g/dL — ABNORMAL LOW (ref 12.0–15.0)
IMMATURE GRANULOCYTES: 1 %
Lymphocytes Relative: 9 %
Lymphs Abs: 1.1 10*3/uL (ref 0.7–4.0)
MCH: 27.6 pg (ref 26.0–34.0)
MCHC: 32.7 g/dL (ref 30.0–36.0)
MCV: 84.5 fL (ref 80.0–100.0)
MONO ABS: 1.5 10*3/uL — AB (ref 0.1–1.0)
Monocytes Relative: 12 %
NEUTROS PCT: 73 %
Neutro Abs: 8.7 10*3/uL — ABNORMAL HIGH (ref 1.7–7.7)
Platelets: 209 10*3/uL (ref 150–400)
RBC: 4.27 MIL/uL (ref 3.87–5.11)
RDW: 14.1 % (ref 11.5–15.5)
WBC: 11.8 10*3/uL — AB (ref 4.0–10.5)
nRBC: 0.3 % — ABNORMAL HIGH (ref 0.0–0.2)

## 2018-07-11 LAB — PROTIME-INR
INR: 1.09
Prothrombin Time: 14 seconds (ref 11.4–15.2)

## 2018-07-11 LAB — COMPREHENSIVE METABOLIC PANEL
ALT: 17 U/L (ref 0–44)
AST: 19 U/L (ref 15–41)
Albumin: 4 g/dL (ref 3.5–5.0)
Alkaline Phosphatase: 66 U/L (ref 38–126)
Anion gap: 11 (ref 5–15)
BILIRUBIN TOTAL: 1 mg/dL (ref 0.3–1.2)
CO2: 22 mmol/L (ref 22–32)
Calcium: 8.9 mg/dL (ref 8.9–10.3)
Chloride: 104 mmol/L (ref 98–111)
Creatinine, Ser: 0.57 mg/dL (ref 0.44–1.00)
Glucose, Bld: 103 mg/dL — ABNORMAL HIGH (ref 70–99)
POTASSIUM: 3.2 mmol/L — AB (ref 3.5–5.1)
Sodium: 137 mmol/L (ref 135–145)
TOTAL PROTEIN: 7.1 g/dL (ref 6.5–8.1)

## 2018-07-11 LAB — INFLUENZA PANEL BY PCR (TYPE A & B)
Influenza A By PCR: NEGATIVE
Influenza B By PCR: POSITIVE — AB

## 2018-07-11 LAB — I-STAT BETA HCG BLOOD, ED (MC, WL, AP ONLY): I-stat hCG, quantitative: <5 m[IU]/mL (ref <5)

## 2018-07-11 LAB — I-STAT CG4 LACTIC ACID, ED: LACTIC ACID, VENOUS: 0.83 mmol/L (ref 0.5–1.9)

## 2018-07-11 IMAGING — DX DG CHEST 2V
2 series · 2 of 2 positions shown · non-contrast
Comparison: Chest x-ray dated [DATE].

CLINICAL DATA: Cough.

EXAM:
CHEST - 2 VIEW

[chest lat]
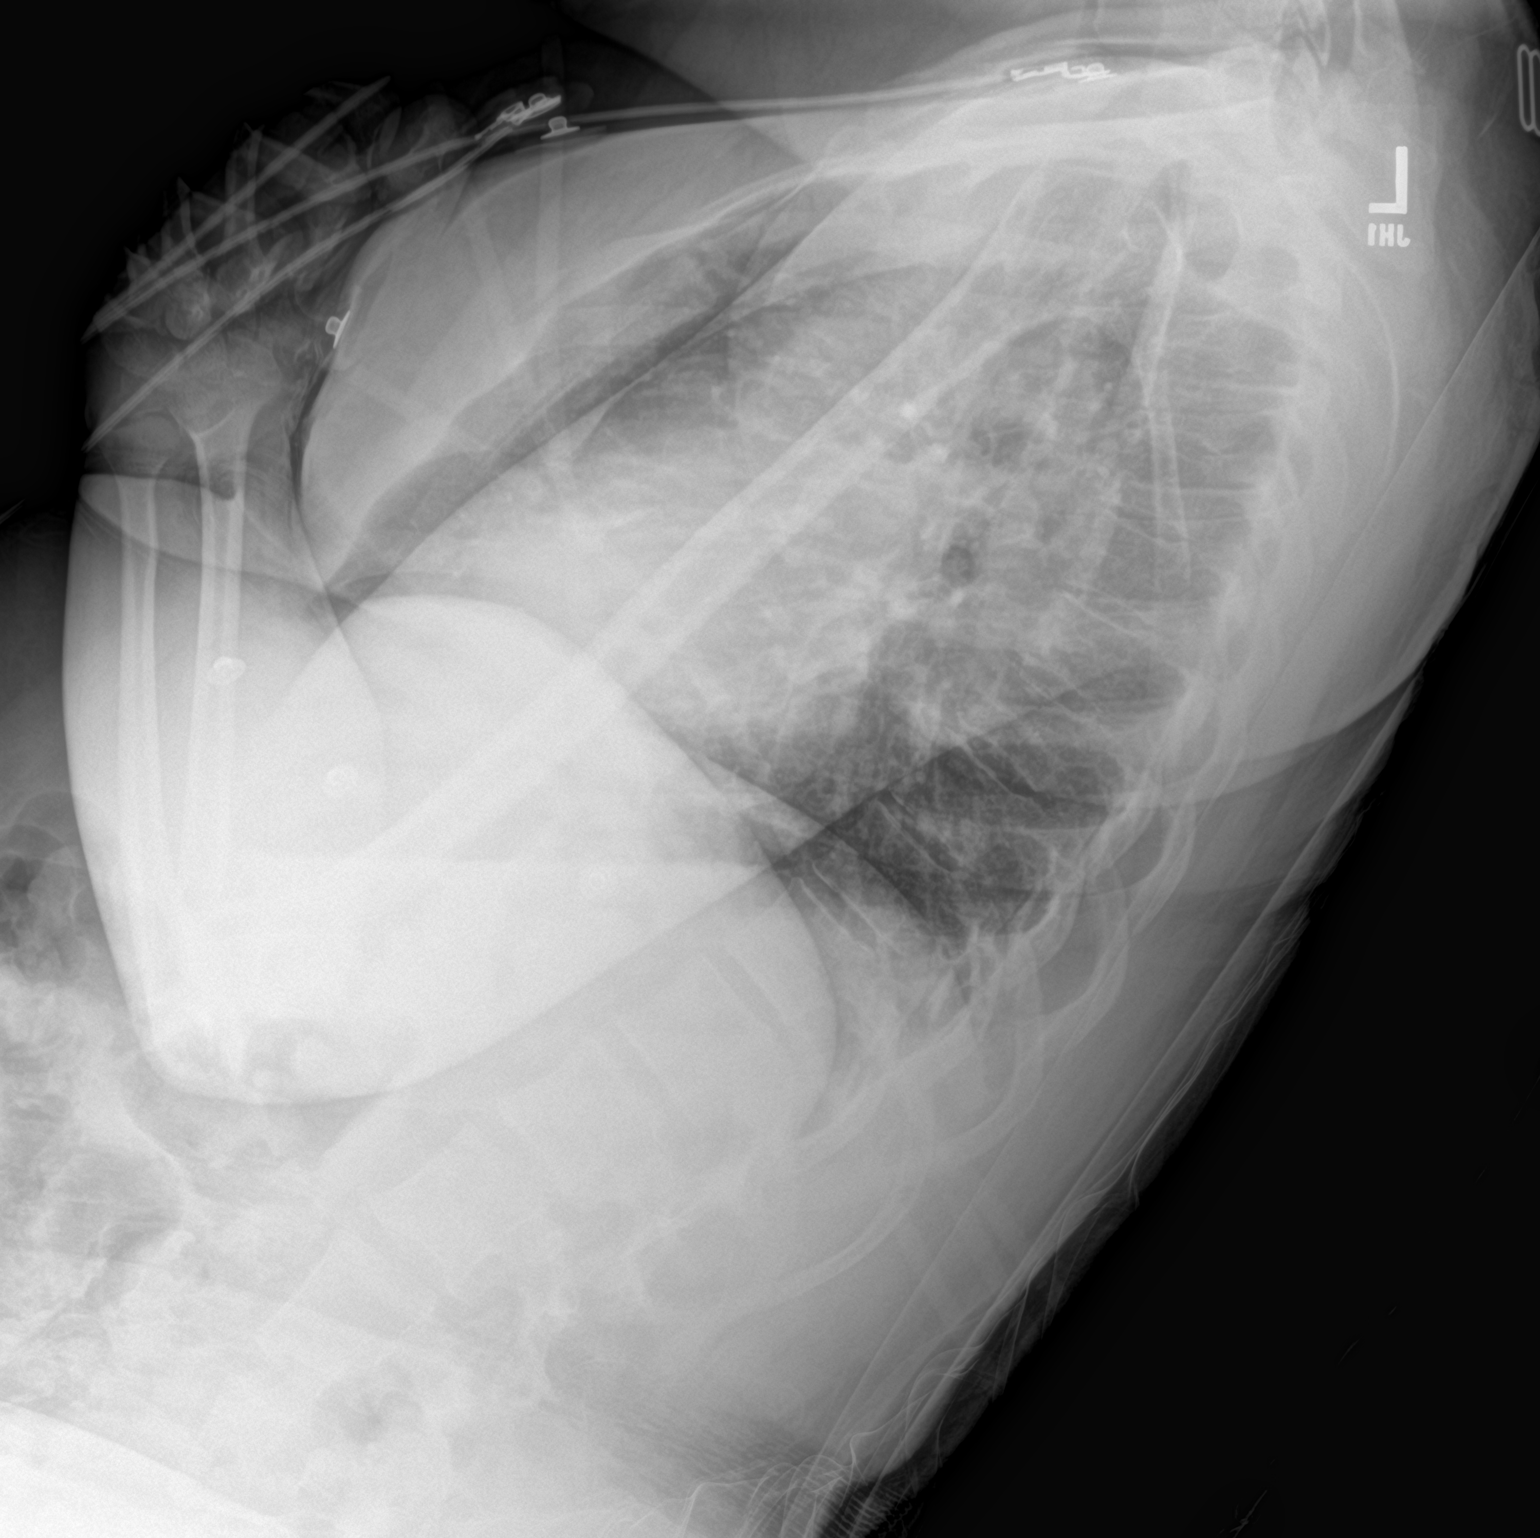

[chest ap]
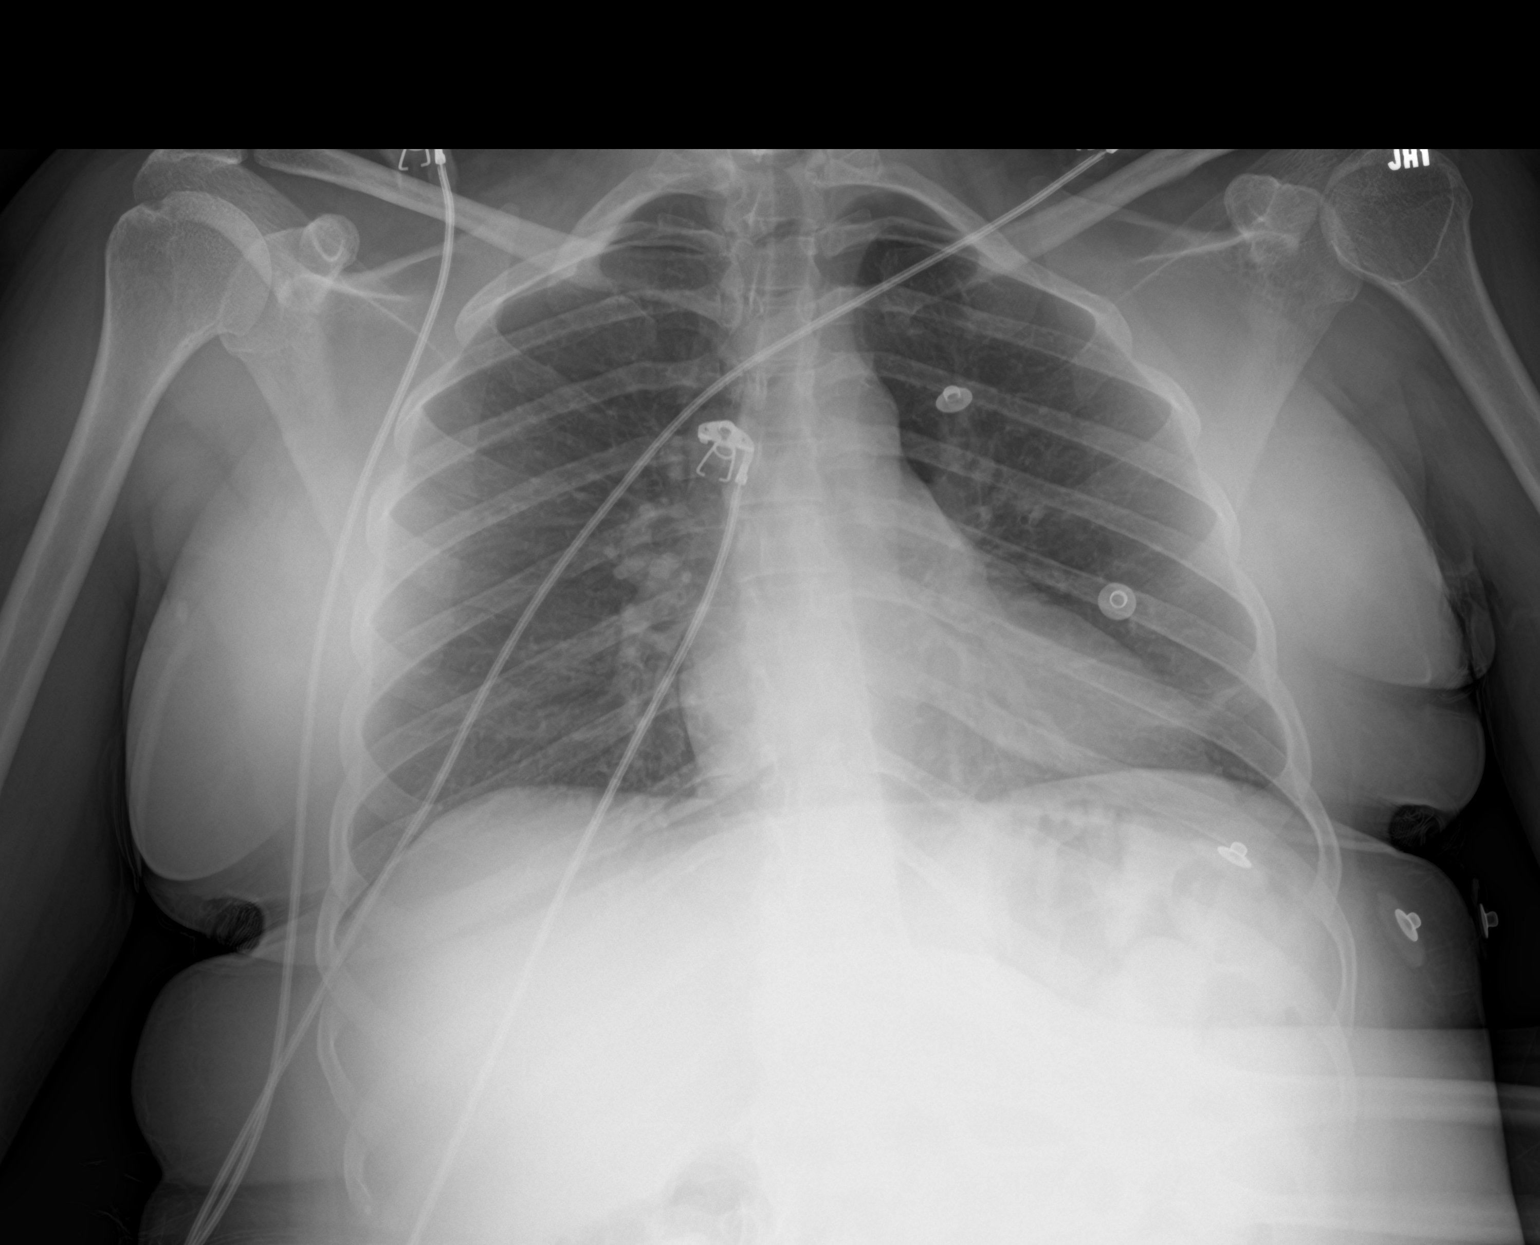

[2 of 2 positions shown; findings below may reference images not displayed]

FINDINGS: The heart size and mediastinal contours are within normal limits.
Both lungs are clear. The visualized skeletal structures are
unremarkable.
IMPRESSION: No active cardiopulmonary disease.

## 2018-07-11 MED ORDER — SODIUM CHLORIDE 0.9 % IV SOLN: 2.0000 g | Freq: Once | INTRAVENOUS | Status: DC

## 2018-07-11 MED ORDER — SODIUM CHLORIDE 0.9 % IV SOLN
500.0000 mg | Freq: Once | INTRAVENOUS | Status: AC
  Administered 2018-07-11: 500 mg via INTRAVENOUS
  Filled 2018-07-11: qty 500

## 2018-07-11 MED ORDER — IPRATROPIUM-ALBUTEROL 0.5-2.5 (3) MG/3ML IN SOLN
3.0000 mL | Freq: Four times a day (QID) | RESPIRATORY_TRACT | Status: DC
  Filled 2018-07-11: qty 3

## 2018-07-11 MED ORDER — ACETAMINOPHEN 650 MG RE SUPP: 650.0000 mg | Freq: Four times a day (QID) | RECTAL | Status: DC | PRN

## 2018-07-11 MED ORDER — OSELTAMIVIR PHOSPHATE 75 MG PO CAPS
75.0000 mg | ORAL_CAPSULE | Freq: Two times a day (BID) | ORAL | Status: DC
  Administered 2018-07-12 – 2018-07-15 (×7): 75 mg via ORAL
  Filled 2018-07-11 (×7): qty 1

## 2018-07-11 MED ORDER — SODIUM CHLORIDE 0.9 % IV SOLN
2.0000 g | INTRAVENOUS | Status: DC
  Filled 2018-07-11: qty 20

## 2018-07-11 MED ORDER — DALFAMPRIDINE ER 10 MG PO TB12
10.0000 mg | ORAL_TABLET | Freq: Two times a day (BID) | ORAL | Status: DC
  Administered 2018-07-12 – 2018-07-15 (×7): 10 mg via ORAL

## 2018-07-11 MED ORDER — BISACODYL 5 MG PO TBEC: 5.0000 mg | DELAYED_RELEASE_TABLET | Freq: Every day | ORAL | Status: DC | PRN

## 2018-07-11 MED ORDER — ONDANSETRON HCL 4 MG PO TABS: 4.0000 mg | ORAL_TABLET | Freq: Four times a day (QID) | ORAL | Status: DC | PRN

## 2018-07-11 MED ORDER — SODIUM CHLORIDE 0.9 % IV BOLUS
1000.0000 mL | Freq: Once | INTRAVENOUS | Status: AC
  Administered 2018-07-11: 1000 mL via INTRAVENOUS

## 2018-07-11 MED ORDER — ONDANSETRON HCL 4 MG/2ML IJ SOLN: 4.0000 mg | Freq: Four times a day (QID) | INTRAMUSCULAR | Status: DC | PRN

## 2018-07-11 MED ORDER — ACETAMINOPHEN 500 MG PO TABS
1000.0000 mg | ORAL_TABLET | Freq: Once | ORAL | Status: AC
  Administered 2018-07-11: 1000 mg via ORAL
  Filled 2018-07-11: qty 2

## 2018-07-11 MED ORDER — OSELTAMIVIR PHOSPHATE 75 MG PO CAPS
75.0000 mg | ORAL_CAPSULE | Freq: Once | ORAL | Status: AC
  Administered 2018-07-12: 75 mg via ORAL
  Filled 2018-07-11: qty 1

## 2018-07-11 MED ORDER — HYDROXYZINE HCL 25 MG PO TABS
25.0000 mg | ORAL_TABLET | Freq: Every day | ORAL | Status: DC
  Administered 2018-07-12 – 2018-07-14 (×4): 25 mg via ORAL
  Filled 2018-07-11 (×4): qty 1

## 2018-07-11 MED ORDER — SODIUM CHLORIDE 0.9 % IV SOLN
2.0000 g | Freq: Once | INTRAVENOUS | Status: AC
  Administered 2018-07-11: 2 g via INTRAVENOUS
  Filled 2018-07-11: qty 20

## 2018-07-11 MED ORDER — MAGNESIUM CITRATE PO SOLN: 1.0000 | Freq: Once | ORAL | Status: DC | PRN

## 2018-07-11 MED ORDER — ACETAMINOPHEN 325 MG PO TABS
650.0000 mg | ORAL_TABLET | Freq: Four times a day (QID) | ORAL | Status: DC | PRN
  Administered 2018-07-12 – 2018-07-13 (×2): 650 mg via ORAL
  Filled 2018-07-11 (×2): qty 2

## 2018-07-11 MED ORDER — ENOXAPARIN SODIUM 40 MG/0.4ML ~~LOC~~ SOLN
40.0000 mg | Freq: Every day | SUBCUTANEOUS | Status: DC
  Administered 2018-07-12 – 2018-07-14 (×4): 40 mg via SUBCUTANEOUS
  Filled 2018-07-11 (×4): qty 0.4

## 2018-07-11 MED ORDER — OXYCODONE HCL 5 MG PO TABS
5.0000 mg | ORAL_TABLET | ORAL | Status: DC | PRN
  Filled 2018-07-11: qty 1

## 2018-07-11 MED ORDER — SENNOSIDES-DOCUSATE SODIUM 8.6-50 MG PO TABS: 1.0000 | ORAL_TABLET | Freq: Every evening | ORAL | Status: DC | PRN

## 2018-07-11 MED ORDER — SODIUM CHLORIDE 0.9 % IV SOLN: 1.0000 g | Freq: Once | INTRAVENOUS | Status: DC

## 2018-07-11 MED ORDER — POTASSIUM CHLORIDE CRYS ER 20 MEQ PO TBCR
20.0000 meq | EXTENDED_RELEASE_TABLET | Freq: Two times a day (BID) | ORAL | Status: AC
  Administered 2018-07-12 (×2): 20 meq via ORAL
  Filled 2018-07-11 (×2): qty 1

## 2018-07-11 MED ORDER — METRONIDAZOLE IN NACL 5-0.79 MG/ML-% IV SOLN: 500.0000 mg | Freq: Three times a day (TID) | INTRAVENOUS | Status: DC

## 2018-07-11 MED ORDER — BACLOFEN 20 MG PO TABS
30.0000 mg | ORAL_TABLET | Freq: Every day | ORAL | Status: DC
  Administered 2018-07-12 – 2018-07-15 (×4): 30 mg via ORAL
  Filled 2018-07-11 (×2): qty 1
  Filled 2018-07-11: qty 3
  Filled 2018-07-11: qty 1

## 2018-07-11 MED ORDER — SODIUM CHLORIDE 0.9 % IV SOLN
INTRAVENOUS | Status: DC
  Administered 2018-07-12 – 2018-07-14 (×4): via INTRAVENOUS

## 2018-07-11 MED ORDER — VANCOMYCIN HCL IN DEXTROSE 1-5 GM/200ML-% IV SOLN: 1000.0000 mg | Freq: Once | INTRAVENOUS | Status: DC

## 2018-07-11 MED ORDER — PHENTERMINE HCL 37.5 MG PO TABS
37.5000 mg | ORAL_TABLET | Freq: Every day | ORAL | Status: DC
  Administered 2018-07-12 – 2018-07-15 (×4): 37.5 mg via ORAL

## 2018-07-11 NOTE — ED Triage Notes (Signed)
Pt BIB [facility] from home. Pt hx of MS and had treatment yesterday after the treatment she started having intense muscle spasms over her whole body. Pt has been sick with a virus for a week and has not been eating or drinking well for the past week. Pt has gotten this treatment before, no new medications. CAOx4.

## 2018-07-11 NOTE — ED Provider Notes (Signed)
[facility]-EMERGENCY DEPT Provider Note   CSN: [id number] Arrival date & time: 07/11/18  1816     History   Chief Complaint Chief Complaint  Patient presents with  . Spasms    HPI Lindsay Schneider is a 32 y.o. female with history of multiple sclerosis currently on immunosuppressive therapy, natalizumab, presenting for 1 day history of fever, chills, cough and muscle spasm.  Patient states that her symptoms began suddenly and have been progressively worse since yesterday.  She has not taken any medication prior to arrival.  Patient versus generalized body aches and a headache.  Patient states that her headache is moderate intensity, generalized, constant and worsened with coughing.  Patient denies chest pain, shortness of breath, vomiting, diarrhea, vision changes, neck pain or any other concerns at this time.  HPI  Past Medical History:  Diagnosis Date  . Movement disorder   . MS (multiple sclerosis) [facility])     Patient Active Problem List   Diagnosis Date Noted  . Sepsis, viral (HCC) 07/11/2018  . Rash 08/28/2017  . Left foot drop 02/08/2017  . 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 Left hip pain 08/30/2016  . Multiple sclerosis (HCC) 04/27/2015  . Gait disturbance 04/27/2015  . Urinary disorder 04/27/2015  . Other fatigue 04/27/2015  . Left hand weakness 04/27/2015  . Bilateral leg weakness 04/27/2015    Past Surgical History:  Procedure Laterality Date  . CESAREAN SECTION       OB History   No obstetric history on file.      Home Medications    Prior to Admission medications   Medication Sig Start Date End Date Taking? Authorizing Provider  baclofen (LIORESAL) 10 MG tablet Take 1 tablet (10 mg total) by mouth 3 (three) times daily. Patient taking differently: Take 30 mg by mouth daily after breakfast.  08/28/17  Yes Sater, Pearletha Furl, MD  dalfampridine (AMPYRA) 10 MG TB12 Take 1 tablet (10 mg total) by  mouth 2 (two) times daily. 08/28/17  Yes Sater, Pearletha Furl, MD  hydrOXYzine (ATARAX/VISTARIL) 25 MG tablet Take 25 mg by mouth at bedtime.  01/25/18  Yes [provider]  natalizumab (TYSABRI) 300 MG/15ML injection Inject 300 mg into the vein every 30 (thirty) days.   Yes [provider]  phentermine (ADIPEX-P) 37.5 MG tablet TAKE 1 TABLET BY MOUTH EVERY MORNING Patient taking differently: Take 37.5 mg by mouth daily before breakfast.  01/31/18  Yes Sater, Pearletha Furl, MD  triamcinolone (KENALOG) 0.025 % ointment Apply 1 application topically 2 (two) times daily as needed (rash).  01/25/18  Yes [provider]  meloxicam (MOBIC) 7.5 MG tablet Take 1 tablet (7.5 mg total) by mouth daily. Patient not taking: Reported on 07/11/2018 08/28/17   Asa Lente, MD    Family History Family History  Problem Relation Age of Onset  . Asthma Mother   . Hypertension Father   . Healthy Sister   . Healthy Brother     Social History Social History   Tobacco Use  . Smoking status: Never Smoker  . Smokeless tobacco: Never Used  Substance Use Topics  . Alcohol use: No  . Drug use: No     Allergies   Patient has no known allergies.   Review of Systems Review of Systems  Constitutional: Positive for chills and fever.  HENT: Negative.  Negative for rhinorrhea and sore throat.   Eyes: Negative.  Negative for visual disturbance.  Respiratory: Positive  for cough. Negative for shortness of breath.   Cardiovascular: Negative.  Negative for chest pain and palpitations.  Gastrointestinal: Positive for nausea. Negative for abdominal pain, blood in stool, diarrhea and vomiting.  Genitourinary: Negative.  Negative for dysuria, hematuria, vaginal bleeding and vaginal discharge.  Musculoskeletal: Positive for arthralgias and myalgias. Negative for neck pain and neck stiffness.  Skin: Negative.  Negative for rash.  Neurological: Positive for headaches. Negative for dizziness and  weakness.   Physical Exam Updated Vital Signs BP (!) 148/98 (BP Location: Right Arm)   Pulse (!) 121   Temp (!) 100.8 F (38.2 C)   Resp (!) 26   Ht 5\' 2"  (1.575 m)   Wt 68 kg   LMP 05/28/2018   SpO2 100%   BMI 27.44 kg/m   Physical Exam Constitutional:      General: She is not in acute distress.    Appearance: She is ill-appearing.  HENT:     Head: Normocephalic and atraumatic.     Right Ear: Tympanic membrane, ear canal and external ear normal.     Left Ear: Tympanic membrane, ear canal and external ear normal.     Nose: Nose normal.     Mouth/Throat:     Mouth: Mucous membranes are moist.     Pharynx: Oropharynx is clear.  Eyes:     Extraocular Movements: Extraocular movements intact.     Conjunctiva/sclera: Conjunctivae normal.     Pupils: Pupils are equal, round, and reactive to light.  Neck:     Musculoskeletal: Normal range of motion and neck supple.  Cardiovascular:     Rate and Rhythm: Regular rhythm. Tachycardia present.     Pulses: Normal pulses.     Heart sounds: Normal heart sounds.  Pulmonary:     Effort: Pulmonary effort is normal. No respiratory distress.     Breath sounds: Rhonchi present.  Chest:     Chest wall: No tenderness.  Abdominal:     General: Abdomen is flat.     Palpations: Abdomen is soft.     Tenderness: There is no abdominal tenderness. There is no guarding or rebound.  Musculoskeletal: Normal range of motion.     Right lower leg: No edema.     Left lower leg: No edema.     Comments: Contracture of left upper extremity  Skin:    General: Skin is warm and dry.     Capillary Refill: Capillary refill takes less than 2 seconds.  Neurological:     General: No focal deficit present.     Mental Status: She is alert and oriented to person, place, and time.     GCS: GCS eye subscore is 4. GCS verbal subscore is 5. GCS motor subscore is 6.     Comments: Mental Status: Alert, oriented, thought content appropriate, able to give a coherent  history. Speech fluent without evidence of aphasia. Able to follow 2 step commands without difficulty. Cranial Nerves: II: Peripheral visual fields grossly normal, pupils equal, round, reactive to light III,IV, VI: ptosis not present, extra-ocular motions intact bilaterally V,VII: smile symmetric, eyebrows raise symmetric, facial light touch sensation equal VIII: hearing grossly normal to voice X: uvula elevates symmetrically XI: bilateral shoulder shrug symmetric and strong XII: midline tongue extension without fassiculations Motor: Normal tone.  Patient with MS and contractures, per patient and family she is moving all 4 extremities and at baseline. Sensory: Sensation intact to light touch in all extremities. Cerebellar: normal finger-to-nose of the right upper extremity  CV: distal pulses palpable throughout  Psychiatric:        Mood and Affect: Mood normal.        Behavior: Behavior normal.    ED Treatments / Results  Labs (all labs ordered are listed, but only abnormal results are displayed) Labs Reviewed  COMPREHENSIVE METABOLIC PANEL - Abnormal; Notable for the following components:      Result Value   Potassium 3.2 (*)    Glucose, Bld 103 (*)    BUN <5 (*)    All other components within normal limits  CBC WITH DIFFERENTIAL/PLATELET - Abnormal; Notable for the following components:   WBC 11.8 (*)    Hemoglobin 11.8 (*)    nRBC 0.3 (*)    Neutro Abs 8.7 (*)    Monocytes Absolute 1.5 (*)    Eosinophils Absolute 0.6 (*)    All other components within normal limits  INFLUENZA PANEL BY PCR (TYPE A & B) - Abnormal; Notable for the following components:   Influenza B By PCR POSITIVE (*)    All other components within normal limits  CULTURE, BLOOD (ROUTINE X 2)  CULTURE, BLOOD (ROUTINE X 2)  CULTURE, BLOOD (ROUTINE X 2)  CULTURE, BLOOD (ROUTINE X 2)  PROTIME-INR  URINALYSIS, ROUTINE W REFLEX MICROSCOPIC  I-STAT CG4 LACTIC ACID, ED  I-STAT BETA HCG BLOOD, ED ([facility],  [facility], [facility] ONLY)  I-STAT CG4 LACTIC ACID, ED    EKG EKG Interpretation  Date/Time:  Thursday July 11 2018 19:58:14 EST Ventricular Rate:  121 PR Interval:    QRS Duration: 73 QT Interval:  305 QTC Calculation: 433 R Axis:   45 Text Interpretation:  Sinus tachycardia Borderline low voltage, extremity leads Nonspecific T abnormalities, anterior leads no prior to compare with Confirmed by Meridee ScoreButler, Michael [phone]([id number]) on 07/11/2018 8:01:40 PM   Radiology Dg Chest 2 View  Result Date: 07/11/2018 CLINICAL DATA:  Cough. EXAM: CHEST - 2 VIEW COMPARISON:  Chest x-ray dated January 20, 2008. FINDINGS: The heart size and mediastinal contours are within normal limits. Both lungs are clear. The visualized skeletal structures are unremarkable. IMPRESSION: No active cardiopulmonary disease. Electronically Signed   By: Obie DredgeWilliam T Derry M.D.   On: 07/11/2018 19:56    Procedures .Critical Care Performed by: Bill SalinasMorelli, Brandon A, PA-C Authorized by: Bill SalinasMorelli, Brandon A, PA-C   Critical care provider statement:    Critical care time (minutes):  32   Critical care was necessary to treat or prevent imminent or life-threatening deterioration of the following conditions:  Sepsis   Critical care was time spent personally by me on the following activities:  Ordering and performing treatments and interventions, ordering and review of laboratory studies, ordering and review of radiographic studies, pulse oximetry, re-evaluation of patient's condition, review of old charts, obtaining history from patient or surrogate, examination of patient, evaluation of patient's response to treatment, discussions with consultants and development of treatment plan with patient or surrogate   (including critical care time)  Medications Ordered in ED Medications  oseltamivir (TAMIFLU) capsule 75 mg (has no administration in time range)  azithromycin (ZITHROMAX) 500 mg in sodium chloride 0.9 % 250 mL IVPB ( Intravenous Stopped 07/11/18  2125)  cefTRIAXone (ROCEPHIN) 2 g in sodium chloride 0.9 % 100 mL IVPB ( Intravenous Stopped 07/11/18 1954)  acetaminophen (TYLENOL) tablet 1,000 mg (1,000 mg Oral Given 07/11/18 1915)  sodium chloride 0.9 % bolus 1,000 mL (0 mLs Intravenous Stopped 07/11/18 2117)     Initial Impression / Assessment and Plan / ED  Course  I have reviewed the triage vital signs and the nursing notes.  Pertinent labs & imaging results that were available during my care of the patient were reviewed by me and considered in my medical decision making (see chart for details).    32 year old female on immunosuppressive therapy for her MS presenting today, she is febrile, tachycardic and tachypneic.  Patient with rhonchorous cough, code sepsis called.  Respiratory antibiotics begun.  Discussed with Dr. Charm Barges who agrees with plan of care at this time. --------------- Pregnancy test negative Lactic acid within normal limits CMP nonacute CBC with mild leukocytosis PT/INR within normal limits Chest x-ray negative EKG with tachycardia, no acute changes reviewed by Dr. Charm Barges Fluids, IV Rocephin, IV azithromycin begun. Consult called to hospitalist for admission. -------------------------- Influenza B positive Tamiflu ordered however patient wants to wait to take this medication until discussion with hospitalist due to her natalizumab therapy Urinalysis pending --------------------------- Patient has been accepted to hospitalist service, appreciate hospitalist consultation.   Patient's case discussed with Dr. Charm Barges who agrees with workup and admission.   Note: Portions of this report may have been transcribed using voice recognition software. Every effort was made to ensure accuracy; however, inadvertent computerized transcription errors may still be present. Final Clinical Impressions(s) / ED Diagnoses   Final diagnoses:  Sepsis, due to unspecified organism, unspecified whether acute organ dysfunction  present [facility])    ED Discharge Orders    None       Elizabeth Palau 07/11/18 2259    Terrilee Files, MD 07/11/18 2315

## 2018-07-11 NOTE — H&P (Signed)
History and Physical   [facility] [place] [facility] Admission History and Physical [employer], D.O.    Patient Name: Lindsay Schneider MR#: [id number] Date of Birth: [birth year] Date of Admission: 07/11/2018  Referring MD/NP/PA: PA Marelli Primary Care Physician: Dorothyann Peng, MD  Chief Complaint:  Chief Complaint  Patient presents with  . Spasms    HPI: Lindsay Schneider is a 31 y.o. female with a known history of multiple sclerosis, wheelchair-bound presents to the emergency department for evaluation of muscle spasm.  Patient was in a usual state of health until yesterday when she developed symptoms of worsening muscle spasm associated with fevers, chills, nonproductive cough, body aches and headache.  Her daughter who is in second grade has been ill recently but has not been tested for influenza.  She has a history of urinary tract infections but denies symptoms at this time. No Dysuria, hematuria, frequency  Otherwise there has been no change in status. Patient has been taking medication as prescribed and there has been no recent change in medication or diet.  No recent antibiotics.  There has been no recent illness, hospitalizations, travel or sick contacts.    EMS/ED Course: Patient received flu, IV fluids. Medical admission has been requested for further management of sepsis secondary to influenza B.  Review of Systems:  CONSTITUTIONAL: Positive fever/chills, fatigue, weakness, body aches, headache .negative weight gain/loss. EYES: No blurry or double vision. ENT: No tinnitus, postnasal drip, redness or soreness of the oropharynx. RESPIRATORY: Positive  cough, negative dyspnea, wheeze.  No hemoptysis.  CARDIOVASCULAR: No chest pain, palpitations, syncope, orthopnea. No lower extremity edema.  GASTROINTESTINAL: No nausea, vomiting, abdominal pain, diarrhea, constipation.  No hematemesis, melena or hematochezia. GENITOURINARY: No dysuria, frequency,  hematuria. ENDOCRINE: No polyuria or nocturia. No heat or cold intolerance. HEMATOLOGY: No anemia, bruising, bleeding. INTEGUMENTARY: No rashes, ulcers, lesions. MUSCULOSKELETAL: No arthritis, gout. NEUROLOGIC: No numbness, tingling, ataxia, seizure-type activity, weakness. PSYCHIATRIC: No anxiety, depression, insomnia.   Past Medical History:  Diagnosis Date  . Movement disorder   . MS (multiple sclerosis) (HCC)     Past Surgical History:  Procedure Laterality Date  . CESAREAN SECTION       reports that she has never smoked. She has never used smokeless tobacco. She reports that she does not drink alcohol or use drugs.  No Known Allergies  Family History  Problem Relation Age of Onset  . Asthma Mother   . Hypertension Father   . Healthy Sister   . Healthy Brother     Prior to Admission medications   Medication Sig Start Date End Date Taking? Authorizing Provider  baclofen (LIORESAL) 10 MG tablet Take 1 tablet (10 mg total) by mouth 3 (three) times daily. Patient taking differently: Take 30 mg by mouth daily after breakfast.  08/28/17  Yes Sater, Pearletha Furl, MD  dalfampridine (AMPYRA) 10 MG TB12 Take 1 tablet (10 mg total) by mouth 2 (two) times daily. 08/28/17  Yes Sater, Pearletha Furl, MD  hydrOXYzine (ATARAX/VISTARIL) 25 MG tablet Take 25 mg by mouth at bedtime.  01/25/18  Yes [provider]  natalizumab (TYSABRI) 300 MG/15ML injection Inject 300 mg into the vein every 30 (thirty) days.   Yes [provider]  phentermine (ADIPEX-P) 37.5 MG tablet TAKE 1 TABLET BY MOUTH EVERY MORNING Patient taking differently: Take 37.5 mg by mouth daily before breakfast.  01/31/18  Yes Sater, Pearletha Furl, MD  triamcinolone (KENALOG) 0.025 % ointment Apply 1 application topically 2 (two) times  daily as needed (rash).  01/25/18  Yes [provider]  meloxicam (MOBIC) 7.5 MG tablet Take 1 tablet (7.5 mg total) by mouth daily. Patient not taking: Reported on 07/11/2018 08/28/17    Asa LenteSater, Richard A, MD    Physical Exam: Vitals:   07/11/18 1915 07/11/18 1959 07/11/18 2006 07/11/18 2039  BP: (!) 144/105  (!) 139/99 (!) 148/98  Pulse: (!) 133  (!) 133 (!) 121  Resp: (!) 25  (!) 29 (!) 26  Temp:      SpO2: 96%  99% 100%  Weight:  68 kg    Height:  5\' 2"  (1.575 m)      GENERAL: 32 y.o.-year-old female patient, well-developed, well-nourished lying in the bed in no acute distress.  Pleasant and cooperative.   HEENT: Head atraumatic, normocephalic. Pupils equal. Mucus membranes moist. NECK: Supple. No JVD. CHEST: Rhonchi at the left base. No use of accessory muscles of respiration.  No reproducible chest wall tenderness.  CARDIOVASCULAR: S1, S2 normal. No murmurs, rubs, or gallops. Cap refill <2 seconds. Pulses intact distally.  ABDOMEN: Soft, nondistended, nontender. No rebound, guarding, rigidity. Normoactive bowel sounds present in all four quadrants.  EXTREMITIES: Left upper extremity contracture.  Bilateral lower extremity weakness no pedal edema, cyanosis, or clubbing. No calf tenderness or Homan's sign.  NEUROLOGIC: The patient is alert and oriented x 3. Cranial nerves II through XII are grossly intact with no focal sensorimotor deficit. PSYCHIATRIC:  Normal affect, mood, thought content. SKIN: Warm, dry, and intact without obvious rash, lesion, or ulcer.    Labs on Admission:  CBC: Recent Labs  Lab 07/11/18 1908  WBC 11.8*  NEUTROABS 8.7*  HGB 11.8*  HCT 36.1  MCV 84.5  PLT 209   Basic Metabolic Panel: Recent Labs  Lab 07/11/18 1908  NA 137  K 3.2*  CL 104  CO2 22  GLUCOSE 103*  BUN <5*  CREATININE 0.57  CALCIUM 8.9   GFR: Estimated Creatinine Clearance: 93 mL/min (by C-G formula based on SCr of 0.57 mg/dL). Liver Function Tests: Recent Labs  Lab 07/11/18 1908  AST 19  ALT 17  ALKPHOS 66  BILITOT 1.0  PROT 7.1  ALBUMIN 4.0   No results for input(s): LIPASE, AMYLASE in the last 168 hours. No results for input(s): AMMONIA in the  last 168 hours. Coagulation Profile: Recent Labs  Lab 07/11/18 1908  INR 1.09   Cardiac Enzymes: No results for input(s): CKTOTAL, CKMB, CKMBINDEX, TROPONINI in the last 168 hours. BNP (last 3 results) No results for input(s): PROBNP in the last 8760 hours. HbA1C: No results for input(s): HGBA1C in the last 72 hours. CBG: No results for input(s): GLUCAP in the last 168 hours. Lipid Profile: No results for input(s): CHOL, HDL, LDLCALC, TRIG, CHOLHDL, LDLDIRECT in the last 72 hours. Thyroid Function Tests: No results for input(s): TSH, T4TOTAL, FREET4, T3FREE, THYROIDAB in the last 72 hours. Anemia Panel: No results for input(s): VITAMINB12, FOLATE, FERRITIN, TIBC, IRON, RETICCTPCT in the last 72 hours. Urine analysis:    Component Value Date/Time   COLORURINE YELLOW 03/05/2012 1915   APPEARANCEUR CLEAR 03/05/2012 1915   LABSPEC <=1.005 12/02/2013 1431   PHURINE 5.5 12/02/2013 1431   GLUCOSEU NEGATIVE 12/02/2013 1431   HGBUR TRACE (A) 12/02/2013 1431   BILIRUBINUR NEGATIVE 12/02/2013 1431   KETONESUR NEGATIVE 12/02/2013 1431   PROTEINUR NEGATIVE 12/02/2013 1431   UROBILINOGEN 0.2 12/02/2013 1431   NITRITE POSITIVE (A) 12/02/2013 1431   LEUKOCYTESUR MODERATE (A) 12/02/2013 1431  Sepsis Labs: @LABRCNTIP (procalcitonin:4,lacticidven:4) )No results found for this or any previous visit (from the past 240 hour(s)).   Radiological Exams on Admission: Dg Chest 2 View  Result Date: 07/11/2018 CLINICAL DATA:  Cough. EXAM: CHEST - 2 VIEW COMPARISON:  Chest x-ray dated January 20, 2008. FINDINGS: The heart size and mediastinal contours are within normal limits. Both lungs are clear. The visualized skeletal structures are unremarkable. IMPRESSION: No active cardiopulmonary disease. Electronically Signed   By: Obie Dredge M.D.   On: 07/11/2018 19:56    Assessment/Plan  This is a 32 y.o. female with a history of MS now being admitted with:  #. Sepsis secondary to influenza B,  UTI - Admit to inpatient with telemetry monitoring - PO Tamiflu - IV Rocephin - IV fluid hydration - Follow up blood,urine & sputum cultures - Repeat CBC in am.   #. Mild hypokalemia -Replace orally  #. History of MS - Continue home medications   Admission status: Inpatient IV Fluids: NS Diet/Nutrition: Regular  Consults called: PT DVT Px: Lovenox, SCDs (wheelchair bound) Code Status: Full Code  Disposition Plan: To home in 1-2 days  All the records are reviewed and case discussed with ED provider. Management plans discussed with the patient and family who express understanding and agree with plan of care.  Mandel Seiden D.O. on 07/11/2018 at 9:50 PM CC: Primary care physician; Dorothyann Peng, MD   07/11/2018, 9:50 PM

## 2018-07-11 NOTE — ED Notes (Signed)
Bed: WA23 Expected date:  Expected time:  Means of arrival:  Comments: EMS-MS exaserbation 

## 2018-07-12 ENCOUNTER — Other Ambulatory Visit: Payer: Self-pay

## 2018-07-12 ENCOUNTER — Encounter (HOSPITAL_COMMUNITY): Payer: Self-pay | Admitting: *Deleted

## 2018-07-12 DIAGNOSIS — G35 Multiple sclerosis: Secondary | ICD-10-CM

## 2018-07-12 DIAGNOSIS — J101 Influenza due to other identified influenza virus with other respiratory manifestations: Secondary | ICD-10-CM

## 2018-07-12 DIAGNOSIS — D72829 Elevated white blood cell count, unspecified: Secondary | ICD-10-CM

## 2018-07-12 DIAGNOSIS — E876 Hypokalemia: Secondary | ICD-10-CM

## 2018-07-12 LAB — CBC
HCT: 31.6 % — ABNORMAL LOW (ref 36.0–46.0)
Hemoglobin: 10.3 g/dL — ABNORMAL LOW (ref 12.0–15.0)
MCH: 27.5 pg (ref 26.0–34.0)
MCHC: 32.6 g/dL (ref 30.0–36.0)
MCV: 84.3 fL (ref 80.0–100.0)
NRBC: 0.5 % — AB (ref 0.0–0.2)
Platelets: 174 10*3/uL (ref 150–400)
RBC: 3.75 MIL/uL — ABNORMAL LOW (ref 3.87–5.11)
RDW: 14.1 % (ref 11.5–15.5)
WBC: 8.3 10*3/uL (ref 4.0–10.5)

## 2018-07-12 LAB — COMPREHENSIVE METABOLIC PANEL
ALT: 17 U/L (ref 0–44)
AST: 24 U/L (ref 15–41)
Albumin: 3.1 g/dL — ABNORMAL LOW (ref 3.5–5.0)
Alkaline Phosphatase: 49 U/L (ref 38–126)
Anion gap: 11 (ref 5–15)
BUN: <5 mg/dL — ABNORMAL LOW (ref 6–20)
CO2: 20 mmol/L — ABNORMAL LOW (ref 22–32)
Calcium: 7.5 mg/dL — ABNORMAL LOW (ref 8.9–10.3)
Chloride: 112 mmol/L — ABNORMAL HIGH (ref 98–111)
Creatinine, Ser: 0.52 mg/dL (ref 0.44–1.00)
GFR calc Af Amer: >60 mL/min (ref >60)
GFR calc non Af Amer: >60 mL/min (ref >60)
Glucose, Bld: 77 mg/dL (ref 70–99)
Potassium: 3.1 mmol/L — ABNORMAL LOW (ref 3.5–5.1)
Sodium: 143 mmol/L (ref 135–145)
TOTAL PROTEIN: 5.5 g/dL — AB (ref 6.5–8.1)
Total Bilirubin: 0.8 mg/dL (ref 0.3–1.2)

## 2018-07-12 LAB — HIV ANTIBODY (ROUTINE TESTING W REFLEX): HIV Screen 4th Generation wRfx: NONREACTIVE

## 2018-07-12 LAB — URINALYSIS, ROUTINE W REFLEX MICROSCOPIC
Bilirubin Urine: NEGATIVE
GLUCOSE, UA: NEGATIVE mg/dL
HGB URINE DIPSTICK: NEGATIVE
Ketones, ur: 80 mg/dL — AB
Leukocytes, UA: NEGATIVE
Nitrite: NEGATIVE
Protein, ur: NEGATIVE mg/dL
SPECIFIC GRAVITY, URINE: 1.009 (ref 1.005–1.030)
pH: 5 (ref 5.0–8.0)

## 2018-07-12 MED ORDER — METHOCARBAMOL 500 MG PO TABS: 500.0000 mg | ORAL_TABLET | Freq: Four times a day (QID) | ORAL | Status: DC | PRN

## 2018-07-12 MED ORDER — POTASSIUM CHLORIDE CRYS ER 20 MEQ PO TBCR
40.0000 meq | EXTENDED_RELEASE_TABLET | ORAL | Status: AC
  Administered 2018-07-12 (×2): 40 meq via ORAL
  Filled 2018-07-12 (×2): qty 2

## 2018-07-12 MED ORDER — IPRATROPIUM-ALBUTEROL 0.5-2.5 (3) MG/3ML IN SOLN: 3.0000 mL | Freq: Four times a day (QID) | RESPIRATORY_TRACT | Status: DC | PRN

## 2018-07-12 NOTE — ED Notes (Signed)
Pts Aunt will bring pts MS medication in the morning 0730 so we can dispense the medications in the morning.

## 2018-07-12 NOTE — Evaluation (Signed)
Physical Therapy Evaluation Patient Details Name: Lindsay Schneider MRN: [id number] DOB: 1986/03/07 Today's Date: 07/12/2018   History of Present Illness  32 yo female admitted 12/19 for sepsis secondary to influenza, muscle spasms in relation to R-R MS. Pt with no other remarkable PMH.   Clinical Impression   Pt presents with LE hypertonicitiy L>R, UE hypertonicity L>R, LE weakness, decreased postural control. These factors are limiting pt in being able to perform bed mobility and transfer to and from bed. At baseline, pt reports being nearly independent in transfers, and occasionally requires help. Pt not at baseline due to feeling ill. Pt to benefit from acute PT to address deficits. Pt unable to transfer safely today, required total assist to scoot in bed and mod-max assist for bed mobility. PT recommending HHPT vs OPPT depending on pt preferences, both pt and pt's aunt at bedside state they feel comfortable with pt d/cing home and aunt states she can physically assist pt as always. Pt is at baseline in wheelchair for mobility, but states she is feeling weaker and tighter in LUE/LLE. Pt may need lifts in the future to improve transfer ability. PT to progress mobility as tolerated, and will continue to follow acutely.      Follow Up Recommendations Home health PT;Outpatient PT;Supervision for mobility/OOB(HHPT vs OPPT)    Equipment Recommendations  None recommended by PT    Recommendations for Other Services       Precautions / Restrictions Precautions Precautions: Fall Restrictions Weight Bearing Restrictions: No      Mobility  Bed Mobility Overal bed mobility: Needs Assistance Bed Mobility: Supine to Sit;Sit to Supine;Rolling Rolling: Mod assist   Supine to sit: Max assist;HOB elevated Sit to supine: Max assist;HOB elevated   General bed mobility comments: Mod assist for rolling for bed pad placement. Max assist for supine<>sit for trunk elevation, bilateral LE translation,  and scooting to EOB with bed pad once sitting upright. Pt able to sit in long sitting with min assist. Pt sat EOB ~10 minutes, attempted to assist pt in transfer to drop arm recliner, but on attempts pt with total assist, unable to safely transfer pt +1. Pt states she feels very weak right now.   Transfers Overall transfer level: (Unable to perform, pt with total assist +1, will require +2 at this time )                  Ambulation/Gait                Stairs            Wheelchair Mobility    Modified Rankin (Stroke Patients Only)       Balance Overall balance assessment: Needs assistance Sitting-balance support: No upper extremity supported Sitting balance-Leahy Scale: Fair Sitting balance - Comments: able to sit unsupported briefly, with L lateral lean if not using RUE support  Postural control: Left lateral lean   Standing balance-Leahy Scale: Zero Standing balance comment: pt does not stand, has been in w/c for 7 years                              Pertinent Vitals/Pain Pain Assessment: 0-10 Pain Score: 7  Pain Location: stomach and head  Pain Descriptors / Indicators: Discomfort Pain Intervention(s): Limited activity within patient's tolerance;Repositioned;RN gave pain meds during session;Monitored during session    Home Living Family/patient expects to be discharged to:: Private residence Living Arrangements: Children(pt has 2  kids, 8 and 12 ) Available Help at Discharge: Family;Available 24 hours/day Type of Home: House Home Access: Stairs to enter   [employer] Corporation[employer]trance Stairs-Number of Steps: 4(Pt's family members pick her up in w/c to take her in and out of house. Pt reports needing a ramp ) Home Layout: One level Home Equipment: Tub bench;Bedside commode;Wheelchair - manual;Wheelchair - power Additional Comments: Pt reports using bedside commode for toileting     Prior Function Level of Independence: Needs assistance   Gait / Transfers  Assistance Needed: pt uses manual wheelchair for mobility in home, uses power wheelchair for community navigation. Pt states she sometimes gets assist for transferring, but typically pt transfers on her own.   ADL's / Homemaking Assistance Needed: gets assist with dressing/bathing/cooking/cleaning. Aide comes 7 days a week, 8 am -2 pm        Hand Dominance   Dominant Hand: Right    Extremity/Trunk Assessment   Upper Extremity Assessment Upper Extremity Assessment: RUE deficits/detail;LUE deficits/detail RUE Deficits / Details: Able to perform AROM elbow flexion/extension  RUE Sensation: WNL LUE Deficits / Details: Pt holds LUE in ~120* elbow flexion, wrist flexion, and finger flexion. Pt states this is worse right now, typically UE is able to move out of this posture but PT with extreme difficulty opening hand and correcting elbow flexion, unable to reach 90* elbow flexion LUE Sensation: WNL    Lower Extremity Assessment Lower Extremity Assessment: RLE deficits/detail;LLE deficits/detail RLE Deficits / Details: Pt with hypertonicity of bilateral LEs L>R. Pt unable to perform hip/knee flexion/extension + clonus sitting EOB with LE WB. Pt with bilateral feet held in PF/eversion, difficult to move pt out of this position.  RLE Sensation: WNL RLE Coordination: decreased gross motor LLE Deficits / Details: see above  LLE Sensation: WNL LLE Coordination: decreased gross motor    Cervical / Trunk Assessment Cervical / Trunk Assessment: Normal  Communication   Communication: No difficulties  Cognition Arousal/Alertness: Awake/alert Behavior During Therapy: WFL for tasks assessed/performed Overall Cognitive Status: Within Functional Limits for tasks assessed                                        General Comments      Exercises     Assessment/Plan    PT Assessment Patient needs continued PT services  PT Problem List Decreased strength;Pain;Decreased range of  motion;Decreased activity tolerance;Decreased balance;Decreased mobility       PT Treatment Interventions DME instruction;Therapeutic activities;Gait training;Therapeutic exercise;Patient/family education;Balance training;Functional mobility training    PT Goals (Current goals can be found in the Care Plan section)  Acute Rehab PT Goals Patient Stated Goal: return to PLOF  PT Goal Formulation: With patient Time For Goal Achievement: 07/26/18 Potential to Achieve Goals: Good    Frequency Min 3X/week   Barriers to discharge        Co-evaluation               AM-PAC PT "6 Clicks" Mobility  Outcome Measure Help needed turning from your back to your side while in a flat bed without using bedrails?: A Lot Help needed moving from lying on your back to sitting on the side of a flat bed without using bedrails?: A Lot Help needed moving to and from a bed to a chair (including a wheelchair)?: A Lot Help needed standing up from a chair using your arms (e.g., wheelchair or bedside chair)?:  Total Help needed to walk in hospital room?: Total Help needed climbing 3-5 steps with a railing? : Total 6 Click Score: 9    End of Session   Activity Tolerance: Patient limited by fatigue Patient left: in bed;with bed alarm set;with call bell/phone within reach;with family/visitor present Nurse Communication: Other (comment)(Unable to locate RN at time of eval, attempted to call RN but RN not available via phone) PT Visit Diagnosis: Muscle weakness (generalized) (M62.81);Other abnormalities of gait and mobility (R26.89)    Time: 1610-9604 PT Time Calculation (min) (ACUTE ONLY): 34 min   Charges:   PT Evaluation $PT Eval Low Complexity: 1 Low PT Treatments $Therapeutic Activity: 8-22 mins      Nicola Police, PT Acute Rehabilitation Services Pager [phone]  Office [phone]   Gautam Langhorst D Landis Cassaro 07/12/2018, 4:06 PM

## 2018-07-12 NOTE — ED Notes (Signed)
Called to give report RN was on the phone and getting report from PACU, requested to call back in 10 minutes

## 2018-07-12 NOTE — ED Notes (Signed)
Pt's Aunt, Miguel RotaRegina Chavis would like to be called when pt receives a rm [phone] (c)

## 2018-07-12 NOTE — Progress Notes (Signed)
Patient ID: Park BreedSherika R Pantaleo, female   DOB: 02/29/1986, 32 y.o.   MRN: [id number]  PROGRESS NOTE    Park BreedSherika R Pettijohn  WUJ:[id number] DOB: 12/13/1985 DOA: 07/11/2018 PCP: Dorothyann PengSanders, Robyn, MD   Brief Narrative:  32 year old female with multiple sclerosis, wheelchair-bound presented with muscle spasm along with fevers, chills, body aches.  She was found to have influenza.  She was started on IV fluids and Tamiflu.   Assessment & Plan:   Active Problems:   Sepsis, viral (HCC)   Sepsis secondary to influenza B -Patient feels better but does not feel ready to go home yet.  Still having some fevers.  Appetite not improved yet.  Will resume normal saline at 100 cc an hour. -Follow cultures -Repeat a.m. labs  Influenza B with acute bronchitis -Continue Tamiflu  Leukocytosis -Resolved  Hypokalemia -Replace.  Repeat a.m. labs  History of multiple sclerosis -Continue home medications.  We will add Robaxin as needed for spasm  Doubt that patient has UTI -No urinary symptoms.  UA is unremarkable.  Discontinue Rocephin  DVT prophylaxis: Lovenox Code Status: Full Family Communication: Spoke to patient and aunt at bedside Disposition Plan: Home in 1 to 2 days  Consultants: None  Procedures: None  Antimicrobials: Rocephin from 07/11/2018 onwards   Subjective: Patient seen and examined at bedside.  She feels slightly better but does not feel ready to go home.  Still complains of left upper extremity spasm.  Appetite is slightly improved.  Still coughing.  Objective: Vitals:   07/12/18 0609 07/12/18 1045 07/12/18 1100 07/12/18 1130  BP: 136/79 (!) 134/97 (!) 141/96   Pulse: 99 (!) 120 (!) 104   Resp: 18 19 (!) 21 18  Temp:  98.9 F (37.2 C)    TempSrc:  Oral    SpO2: 99% 99% 100%   Weight:      Height:        Intake/Output Summary (Last 24 hours) at 07/12/2018 1241 Last data filed at 07/11/2018 2203 Gross per 24 hour  Intake 1387.13 ml  Output -  Net 1387.13 ml    Filed Weights   07/11/18 1959  Weight: 68 kg    Examination:  General exam: Appears calm and comfortable  Respiratory system: Bilateral decreased breath sounds at bases with some scattered crackles Cardiovascular system: S1 & S2 heard, tachycardic Gastrointestinal system: Abdomen is nondistended, soft and nontender. Normal bowel sounds heard. Extremities: No cyanosis, clubbing, edema.  Left upper extremity contracture  Data Reviewed: I have personally reviewed following labs and imaging studies  CBC: Recent Labs  Lab 07/11/18 1908 07/12/18 0606  WBC 11.8* 8.3  NEUTROABS 8.7*  --   HGB 11.8* 10.3*  HCT 36.1 31.6*  MCV 84.5 84.3  PLT 209 174   Basic Metabolic Panel: Recent Labs  Lab 07/11/18 1908 07/12/18 0606  NA 137 143  K 3.2* 3.1*  CL 104 112*  CO2 22 20*  GLUCOSE 103* 77  BUN <5* <5*  CREATININE 0.57 0.52  CALCIUM 8.9 7.5*   GFR: Estimated Creatinine Clearance: 93 mL/min (by C-G formula based on SCr of 0.52 mg/dL). Liver Function Tests: Recent Labs  Lab 07/11/18 1908 07/12/18 0606  AST 19 24  ALT 17 17  ALKPHOS 66 49  BILITOT 1.0 0.8  PROT 7.1 5.5*  ALBUMIN 4.0 3.1*   No results for input(s): LIPASE, AMYLASE in the last 168 hours. No results for input(s): AMMONIA in the last 168 hours. Coagulation Profile: Recent Labs  Lab 07/11/18 1908  INR  1.09   Cardiac Enzymes: No results for input(s): CKTOTAL, CKMB, CKMBINDEX, TROPONINI in the last 168 hours. BNP (last 3 results) No results for input(s): PROBNP in the last 8760 hours. HbA1C: No results for input(s): HGBA1C in the last 72 hours. CBG: No results for input(s): GLUCAP in the last 168 hours. Lipid Profile: No results for input(s): CHOL, HDL, LDLCALC, TRIG, CHOLHDL, LDLDIRECT in the last 72 hours. Thyroid Function Tests: No results for input(s): TSH, T4TOTAL, FREET4, T3FREE, THYROIDAB in the last 72 hours. Anemia Panel: No results for input(s): VITAMINB12, FOLATE, FERRITIN, TIBC, IRON,  RETICCTPCT in the last 72 hours. Sepsis Labs: Recent Labs  Lab 07/11/18 1917  LATICACIDVEN 0.83    Recent Results (from the past 240 hour(s))  Culture, blood (Routine x 2)     Status: None (Preliminary result)   Collection Time: 07/11/18  7:08 PM  Result Value Ref Range Status   Specimen Description   Final    BLOOD RIGHT FOREARM Performed at [facility], [address] [place], [place] [address]    Special Requests   Final    BOTTLES DRAWN AEROBIC AND ANAEROBIC Blood Culture results may not be optimal due to an excessive volume of blood received in culture bottles Performed at [facility], [address] [place], [place] [address]    Culture PENDING  Incomplete   Report Status PENDING  Incomplete         Radiology Studies: Dg Chest 2 View  Result Date: 07/11/2018 CLINICAL DATA:  Cough. EXAM: CHEST - 2 VIEW COMPARISON:  Chest x-ray dated January 20, 2008. FINDINGS: The heart size and mediastinal contours are within normal limits. Both lungs are clear. The visualized skeletal structures are unremarkable. IMPRESSION: No active cardiopulmonary disease. Electronically Signed   By: Obie Dredge M.D.   On: 07/11/2018 19:56        Scheduled Meds: . baclofen  30 mg Oral QPC breakfast  . dalfampridine  10 mg Oral BID  . enoxaparin (LOVENOX) injection  40 mg Subcutaneous QHS  . hydrOXYzine  25 mg Oral QHS  . oseltamivir  75 mg Oral BID  . phentermine  37.5 mg Oral QAC breakfast   Continuous Infusions: . sodium chloride Stopped (07/12/18 0910)  . cefTRIAXone (ROCEPHIN)  IV       LOS: 1 day        Glade Lloyd, MD [facility] Pager [phone]  If 7PM-7AM, please contact night-coverage [email or web address] Password [facility] 07/12/2018, 12:41 PM

## 2018-07-12 NOTE — ED Notes (Signed)
ED TO INPATIENT HANDOFF REPORT  Name/Age/Gender Lindsay Schneider 32 y.o. female  Code Status    Code Status Orders  (From admission, onward)         Start     Ordered   07/11/18 2317  Full code  Continuous     07/11/18 2316        Code Status History    This patient has a current code status but no historical code status.      Home/SNF/Other Home  Chief Complaint muscle spasms  Level of Care/Admitting Diagnosis ED Disposition    ED Disposition Condition Comment   Admit  Hospital Area: [facility] [[id number]]  Level of Care: Telemetry [5]  Admit to tele based on following criteria: Other see comments  Comments: Sepsis 2/2 flu B  Diagnosis: Sepsis, viral [facility]) [161096]  Admitting Physician: Tonye Royalty [[id number]]  Attending Physician: Janann Colonel  Estimated length of stay: past midnight tomorrow  Certification:: I certify this patient will need inpatient services for at least 2 midnights  PT Class (Do Not Modify): Inpatient [101]  PT Acc Code (Do Not Modify): Private [1]       Medical History Past Medical History:  Diagnosis Date  . Movement disorder   . MS (multiple sclerosis) (HCC)     Allergies No Known Allergies  IV Location/Drains/Wounds Patient Lines/Drains/Airways Status   Active Line/Drains/Airways    Name:   Placement date:   Placement time:   Site:   Days:   Peripheral IV 07/11/18 Right Forearm   07/11/18    1906    Forearm   1   Peripheral IV 07/11/18 Right Hand   07/11/18    1907    Hand   1          Labs/Imaging Results for orders placed or performed during the hospital encounter of 07/11/18 (from the past 48 hour(s))  Influenza panel by PCR (type A & B)     Status: Abnormal   Collection Time: 07/11/18  6:56 PM  Result Value Ref Range   Influenza A By PCR NEGATIVE NEGATIVE   Influenza B By PCR POSITIVE (A) NEGATIVE    Comment: (NOTE) The Xpert Xpress Flu assay is intended as an aid in the  diagnosis of  influenza and should not be used as a sole basis for treatment.  This  assay is FDA approved for nasopharyngeal swab specimens only. Nasal  washings and aspirates are unacceptable for Xpert Xpress Flu testing. Performed at [facility], [address], [place], [place] [address]   Comprehensive metabolic panel     Status: Abnormal   Collection Time: 07/11/18  7:08 PM  Result Value Ref Range   Sodium 137 135 - 145 mmol/L   Potassium 3.2 (L) 3.5 - 5.1 mmol/L   Chloride 104 98 - 111 mmol/L   CO2 22 22 - 32 mmol/L   Glucose, Bld 103 (H) 70 - 99 mg/dL   BUN <5 (L) 6 - 20 mg/dL   Creatinine, Ser 7.82 0.44 - 1.00 mg/dL   Calcium 8.9 8.9 - 95.6 mg/dL   Total Protein 7.1 6.5 - 8.1 g/dL   Albumin 4.0 3.5 - 5.0 g/dL   AST 19 15 - 41 U/L   ALT 17 0 - 44 U/L   Alkaline Phosphatase 66 38 - 126 U/L   Total Bilirubin 1.0 0.3 - 1.2 mg/dL   GFR calc non Af Amer >60 >60 mL/min   GFR  calc Af Amer >60 >60 mL/min   Anion gap 11 5 - 15    Comment: Performed at [facility], [address] [place], [place] [address]  CBC with Differential     Status: Abnormal   Collection Time: 07/11/18  7:08 PM  Result Value Ref Range   WBC 11.8 (H) 4.0 - 10.5 K/uL   RBC 4.27 3.87 - 5.11 MIL/uL   Hemoglobin 11.8 (L) 12.0 - 15.0 g/dL   HCT 91.436.1 78.236.0 - 95.646.0 %   MCV 84.5 80.0 - 100.0 fL   MCH 27.6 26.0 - 34.0 pg   MCHC 32.7 30.0 - 36.0 g/dL   RDW 21.314.1 08.611.5 - 57.815.5 %   Platelets 209 150 - 400 K/uL   nRBC 0.3 (H) 0.0 - 0.2 %   Neutrophils Relative % 73 %   Neutro Abs 8.7 (H) 1.7 - 7.7 K/uL   Lymphocytes Relative 9 %   Lymphs Abs 1.1 0.7 - 4.0 K/uL   Monocytes Relative 12 %   Monocytes Absolute 1.5 (H) 0.1 - 1.0 K/uL   Eosinophils Relative 5 %   Eosinophils Absolute 0.6 (H) 0.0 - 0.5 K/uL   Basophils Relative 0 %   Basophils Absolute 0.0 0.0 - 0.1 K/uL   Immature Granulocytes 1 %   Abs Immature Granulocytes 0.06 0.00 - 0.07 K/uL    Comment: Performed at  [facility], [address] [place], [place] [address]  Protime-INR     Status: None   Collection Time: 07/11/18  7:08 PM  Result Value Ref Range   Prothrombin Time 14.0 11.4 - 15.2 seconds   INR 1.09     Comment: Performed at [facility], [address] [place], [place] [address]  Culture, blood (Routine x 2)     Status: None (Preliminary result)   Collection Time: 07/11/18  7:08 PM  Result Value Ref Range   Specimen Description      BLOOD RIGHT FOREARM Performed at [facility], [address] [place], [place] [address]    Special Requests      BOTTLES DRAWN AEROBIC AND ANAEROBIC Blood Culture results may not be optimal due to an excessive volume of blood received in culture bottles Performed at [facility], [address] [place], [place] [address]    Culture PENDING    Report Status PENDING   I-Stat beta hCG blood, ED     Status: None   Collection Time: 07/11/18  7:15 PM  Result Value Ref Range   I-stat hCG, quantitative <5.0 <5 mIU/mL   Comment 3            Comment:   GEST. AGE      CONC.  (mIU/mL)   <=1 WEEK        5 - 50     2 WEEKS       50 - 500     3 WEEKS       100 - 10,000     4 WEEKS     1,000 - 30,000        FEMALE AND NON-PREGNANT FEMALE:     LESS THAN 5 mIU/mL   I-Stat CG4 Lactic Acid, ED     Status: None   Collection Time: 07/11/18  7:17 PM  Result Value Ref Range   Lactic Acid, Venous 0.83 0.5 - 1.9 mmol/L  Urinalysis, Routine w reflex microscopic     Status: Abnormal   Collection  Time: 07/12/18  5:28 AM  Result Value Ref Range   Color, Urine YELLOW YELLOW   APPearance CLEAR CLEAR   Specific Gravity, Urine 1.009 1.005 - 1.030   pH 5.0 5.0 - 8.0   Glucose, UA NEGATIVE NEGATIVE mg/dL   Hgb urine dipstick NEGATIVE NEGATIVE   Bilirubin Urine NEGATIVE NEGATIVE   Ketones, ur 80 (A) NEGATIVE mg/dL   Protein, ur NEGATIVE NEGATIVE mg/dL   Nitrite NEGATIVE NEGATIVE   Leukocytes, UA NEGATIVE  NEGATIVE    Comment: Performed at [facility], [address] [place], [place] [address]  CBC     Status: Abnormal   Collection Time: 07/12/18  6:06 AM  Result Value Ref Range   WBC 8.3 4.0 - 10.5 K/uL   RBC 3.75 (L) 3.87 - 5.11 MIL/uL   Hemoglobin 10.3 (L) 12.0 - 15.0 g/dL   HCT 60.4 (L) 54.0 - 98.1 %   MCV 84.3 80.0 - 100.0 fL   MCH 27.5 26.0 - 34.0 pg   MCHC 32.6 30.0 - 36.0 g/dL   RDW 19.1 47.8 - 29.5 %   Platelets 174 150 - 400 K/uL   nRBC 0.5 (H) 0.0 - 0.2 %    Comment: Performed at [facility], [address], [place], [place] [address]  Comprehensive metabolic panel     Status: Abnormal   Collection Time: 07/12/18  6:06 AM  Result Value Ref Range   Sodium 143 135 - 145 mmol/L   Potassium 3.1 (L) 3.5 - 5.1 mmol/L   Chloride 112 (H) 98 - 111 mmol/L   CO2 20 (L) 22 - 32 mmol/L   Glucose, Bld 77 70 - 99 mg/dL   BUN <5 (L) 6 - 20 mg/dL   Creatinine, Ser 8.65 0.44 - 1.00 mg/dL   Calcium 7.5 (L) 8.9 - 10.3 mg/dL   Total Protein 5.5 (L) 6.5 - 8.1 g/dL   Albumin 3.1 (L) 3.5 - 5.0 g/dL   AST 24 15 - 41 U/L   ALT 17 0 - 44 U/L   Alkaline Phosphatase 49 38 - 126 U/L   Total Bilirubin 0.8 0.3 - 1.2 mg/dL   GFR calc non Af Amer >60 >60 mL/min   GFR calc Af Amer >60 >60 mL/min   Anion gap 11 5 - 15    Comment: Performed at [facility], [address], [place], [place] [address]   Dg Chest 2 View  Result Date: 07/11/2018 CLINICAL DATA:  Cough. EXAM: CHEST - 2 VIEW COMPARISON:  Chest x-ray dated January 20, 2008. FINDINGS: The heart size and mediastinal contours are within normal limits. Both lungs are clear. The visualized skeletal structures are unremarkable. IMPRESSION: No active cardiopulmonary disease. Electronically Signed   By: Obie Dredge M.D.   On: 07/11/2018 19:56   EKG Interpretation  Date/Time:  Thursday July 11 2018 19:58:14 EST Ventricular Rate:  121 PR Interval:    QRS Duration: 73 QT  Interval:  305 QTC Calculation: 433 R Axis:   45 Text Interpretation:  Sinus tachycardia Borderline low voltage, extremity leads Nonspecific T abnormalities, anterior leads no prior to compare with Confirmed by Meridee Score [id number]) on 07/11/2018 8:01:40 PM   Pending Labs Unresulted Labs (From admission, onward)    Start     Ordered   07/18/18 0500  Creatinine, serum  (enoxaparin (LOVENOX)    CrCl >/= 30 ml/min)  Weekly,   R    Comments:  while on enoxaparin therapy  07/11/18 2316   07/11/18 2317  HIV antibody (Routine Testing)  Once,   R     07/11/18 2316   07/11/18 1853  Blood Culture (routine x 2)  BLOOD CULTURE X 2,   STAT     07/11/18 1855   07/11/18 1839  Culture, blood (Routine x 2)  BLOOD CULTURE X 2,   STAT     07/11/18 1839          Vitals/Pain Today's Vitals   07/12/18 0609 07/12/18 1045 07/12/18 1100 07/12/18 1130  BP: 136/79 (!) 134/97 (!) 141/96   Pulse: 99 (!) 120 (!) 104   Resp: 18 19 (!) 21 18  Temp:  98.9 F (37.2 C)    TempSrc:  Oral    SpO2: 99% 99% 100%   Weight:      Height:      PainSc:        Isolation Precautions Droplet precaution  Medications Medications  dalfampridine TB12 10 mg (10 mg Oral Given 07/12/18 0907)  hydrOXYzine (ATARAX/VISTARIL) tablet 25 mg (25 mg Oral Given 07/12/18 0001)  phentermine (ADIPEX-P) tablet 37.5 mg (37.5 mg Oral Given 07/12/18 0917)  baclofen (LIORESAL) tablet 30 mg (30 mg Oral Given 07/12/18 0907)  enoxaparin (LOVENOX) injection 40 mg (40 mg Subcutaneous Given 07/12/18 0002)  0.9 %  sodium chloride infusion ( Intravenous Hold 07/12/18 0910)  acetaminophen (TYLENOL) tablet 650 mg (has no administration in time range)    Or  acetaminophen (TYLENOL) suppository 650 mg (has no administration in time range)  oxyCODONE (Oxy IR/ROXICODONE) immediate release tablet 5 mg (has no administration in time range)  senna-docusate (Senokot-S) tablet 1 tablet (has no administration in time range)  bisacodyl (DULCOLAX)  EC tablet 5 mg (has no administration in time range)  magnesium citrate solution 1 Bottle (has no administration in time range)  ondansetron (ZOFRAN) tablet 4 mg (has no administration in time range)    Or  ondansetron (ZOFRAN) injection 4 mg (has no administration in time range)  oseltamivir (TAMIFLU) capsule 75 mg (75 mg Oral Given 07/12/18 0915)  cefTRIAXone (ROCEPHIN) 2 g in sodium chloride 0.9 % 100 mL IVPB (has no administration in time range)  ipratropium-albuterol (DUONEB) 0.5-2.5 (3) MG/3ML nebulizer solution 3 mL (has no administration in time range)  azithromycin (ZITHROMAX) 500 mg in sodium chloride 0.9 % 250 mL IVPB ( Intravenous Stopped 07/11/18 2125)  cefTRIAXone (ROCEPHIN) 2 g in sodium chloride 0.9 % 100 mL IVPB ( Intravenous Stopped 07/11/18 1954)  acetaminophen (TYLENOL) tablet 1,000 mg (1,000 mg Oral Given 07/11/18 1915)  sodium chloride 0.9 % bolus 1,000 mL (0 mLs Intravenous Stopped 07/11/18 2117)  oseltamivir (TAMIFLU) capsule 75 mg (75 mg Oral Given 07/12/18 0002)  potassium chloride SA (K-DUR,KLOR-CON) CR tablet 20 mEq (20 mEq Oral Given 07/12/18 0907)    Mobility walks

## 2018-07-13 DIAGNOSIS — A419 Sepsis, unspecified organism: Secondary | ICD-10-CM

## 2018-07-13 DIAGNOSIS — B9789 Other viral agents as the cause of diseases classified elsewhere: Secondary | ICD-10-CM

## 2018-07-13 DIAGNOSIS — A4189 Other specified sepsis: Principal | ICD-10-CM

## 2018-07-13 LAB — BASIC METABOLIC PANEL
Anion gap: 9 (ref 5–15)
BUN: <5 mg/dL — ABNORMAL LOW (ref 6–20)
CO2: 22 mmol/L (ref 22–32)
Calcium: 8.2 mg/dL — ABNORMAL LOW (ref 8.9–10.3)
Chloride: 108 mmol/L (ref 98–111)
Creatinine, Ser: 0.63 mg/dL (ref 0.44–1.00)
Glucose, Bld: 119 mg/dL — ABNORMAL HIGH (ref 70–99)
Potassium: 3.7 mmol/L (ref 3.5–5.1)
Sodium: 139 mmol/L (ref 135–145)

## 2018-07-13 LAB — CBC WITH DIFFERENTIAL/PLATELET
Abs Immature Granulocytes: 0.06 10*3/uL (ref 0.00–0.07)
BASOS ABS: 0 10*3/uL (ref 0.0–0.1)
BASOS PCT: 0 %
Eosinophils Absolute: 0 10*3/uL (ref 0.0–0.5)
Eosinophils Relative: 1 %
HCT: 33.8 % — ABNORMAL LOW (ref 36.0–46.0)
Hemoglobin: 10.7 g/dL — ABNORMAL LOW (ref 12.0–15.0)
Immature Granulocytes: 1 %
Lymphocytes Relative: 45 %
Lymphs Abs: 3.6 10*3/uL (ref 0.7–4.0)
MCH: 27.5 pg (ref 26.0–34.0)
MCHC: 31.7 g/dL (ref 30.0–36.0)
MCV: 86.9 fL (ref 80.0–100.0)
Monocytes Absolute: 1.2 10*3/uL — ABNORMAL HIGH (ref 0.1–1.0)
Monocytes Relative: 14 %
Neutro Abs: 3.1 10*3/uL (ref 1.7–7.7)
Neutrophils Relative %: 39 %
PLATELETS: 160 10*3/uL (ref 150–400)
RBC: 3.89 MIL/uL (ref 3.87–5.11)
RDW: 14.4 % (ref 11.5–15.5)
WBC: 8 10*3/uL (ref 4.0–10.5)
nRBC: 1.3 % — ABNORMAL HIGH (ref 0.0–0.2)

## 2018-07-13 LAB — MAGNESIUM: Magnesium: 1.9 mg/dL (ref 1.7–2.4)

## 2018-07-13 MED ORDER — GUAIFENESIN-DM 100-10 MG/5ML PO SYRP
5.0000 mL | ORAL_SOLUTION | ORAL | Status: DC | PRN
  Filled 2018-07-13: qty 10

## 2018-07-13 NOTE — Progress Notes (Signed)
Patient ID: Lindsay Schneider, female   DOB: 1986/05/24, 32 y.o.   MRN: [id number]  PROGRESS NOTE    Lindsay Schneider  UGQ:[id number] DOB: Feb 14, 1986 DOA: 07/11/2018 PCP: Dorothyann Peng, MD   Brief Narrative:  32 year old female with multiple sclerosis, wheelchair-bound presented with muscle spasm along with fevers, chills, body aches.  She was found to have influenza.  She was started on IV fluids and Tamiflu.  07/13/2018: Patient reports some improvement.  Fever is improving.  However, patient continues to have cough that is productive of yellowish-greenish sputum.   Assessment & Plan:   Active Problems:   Sepsis, viral (HCC)   Sepsis secondary to influenza B -Patient feels better but does not feel ready to go home yet.  Still having some fevers.  Appetite not improved yet.  Will resume normal saline at 100 cc an hour. -Follow cultures -Repeat a.m. labs 07/13/2018: Patient is still having fever.  Continue Tamiflu for now.  Blood pressure to add IV vancomycin.  Influenza B with acute bronchitis -Continue Tamiflu -Please see above.  Leukocytosis -Resolved  Hypokalemia -Replace.  Repeat a.m. labs  History of multiple sclerosis -Continue home medications.  We will add Robaxin as needed for spasm  Doubt that patient has UTI -No urinary symptoms.  UA is unremarkable.  Discontinue Rocephin  DVT prophylaxis: Lovenox Code Status: Full Family Communication: Spoke to patient and aunt at bedside Disposition Plan: Home in 1 to 2 days  Consultants: None  Procedures: None  Antimicrobials: Rocephin from 07/11/2018 onwards   Subjective: Patient seen. Patient is still having fever. Patient reports having cough that is productive of yellowish-greenish sputum.  Objective: Vitals:   07/12/18 1309 07/12/18 1646 07/12/18 2031 07/13/18 0453  BP: (!) 147/96  (!) 112/92 (!) 132/97  Pulse: (!) 113  (!) 101 (!) 113  Resp: (!) 26 18 18  (!) 22  Temp: (!) 100.7 F (38.2 C) 100 F  (37.8 C) 99.8 F (37.7 C) (!) 100.4 F (38 C)  TempSrc: Oral Oral Oral Oral  SpO2: 95%  93% (!) 89%  Weight: 65.3 kg     Height: 5\' 2"  (1.575 m)       Intake/Output Summary (Last 24 hours) at 07/13/2018 1313 Last data filed at 07/13/2018 1120 Gross per 24 hour  Intake 1698.35 ml  Output 2800 ml  Net -1101.65 ml   Filed Weights   07/11/18 1959 07/12/18 1309  Weight: 68 kg 65.3 kg    Examination:  General exam: Appears calm and comfortable.  Patient is functional paraplegic. Respiratory system: Decreased air entry. Cardiovascular system: S1 & S2 heard, tachycardic Gastrointestinal system: Abdomen is nondistended, soft and nontender. Normal bowel sounds heard. Extremities: No cyanosis, clubbing, edema.  Left upper extremity contracture Neuro: Awake and alert.  Patient is functional paraplegic.  Data Reviewed: I have personally reviewed following labs and imaging studies  CBC: Recent Labs  Lab 07/11/18 1908 07/12/18 0606 07/13/18 0534  WBC 11.8* 8.3 8.0  NEUTROABS 8.7*  --  3.1  HGB 11.8* 10.3* 10.7*  HCT 36.1 31.6* 33.8*  MCV 84.5 84.3 86.9  PLT 209 174 160   Basic Metabolic Panel: Recent Labs  Lab 07/11/18 1908 07/12/18 0606 07/13/18 0534  NA 137 143 139  K 3.2* 3.1* 3.7  CL 104 112* 108  CO2 22 20* 22  GLUCOSE 103* 77 119*  BUN <5* <5* <5*  CREATININE 0.57 0.52 0.63  CALCIUM 8.9 7.5* 8.2*  MG  --   --  1.9  GFR: Estimated Creatinine Clearance: 91.2 mL/min (by C-G formula based on SCr of 0.63 mg/dL). Liver Function Tests: Recent Labs  Lab 07/11/18 1908 07/12/18 0606  AST 19 24  ALT 17 17  ALKPHOS 66 49  BILITOT 1.0 0.8  PROT 7.1 5.5*  ALBUMIN 4.0 3.1*   No results for input(s): LIPASE, AMYLASE in the last 168 hours. No results for input(s): AMMONIA in the last 168 hours. Coagulation Profile: Recent Labs  Lab 07/11/18 1908  INR 1.09   Cardiac Enzymes: No results for input(s): CKTOTAL, CKMB, CKMBINDEX, TROPONINI in the last 168  hours. BNP (last 3 results) No results for input(s): PROBNP in the last 8760 hours. HbA1C: No results for input(s): HGBA1C in the last 72 hours. CBG: No results for input(s): GLUCAP in the last 168 hours. Lipid Profile: No results for input(s): CHOL, HDL, LDLCALC, TRIG, CHOLHDL, LDLDIRECT in the last 72 hours. Thyroid Function Tests: No results for input(s): TSH, T4TOTAL, FREET4, T3FREE, THYROIDAB in the last 72 hours. Anemia Panel: No results for input(s): VITAMINB12, FOLATE, FERRITIN, TIBC, IRON, RETICCTPCT in the last 72 hours. Sepsis Labs: Recent Labs  Lab 07/11/18 1917  LATICACIDVEN 0.83    Recent Results (from the past 240 hour(s))  Culture, blood (Routine x 2)     Status: None (Preliminary result)   Collection Time: 07/11/18  7:08 PM  Result Value Ref Range Status   Specimen Description   Final    BLOOD RIGHT FOREARM Performed at [facility], [address] [place], [place] [address]    Special Requests   Final    BOTTLES DRAWN AEROBIC AND ANAEROBIC Blood Culture results may not be optimal due to an excessive volume of blood received in culture bottles Performed at [facility], [address] [place], [place] [address]    Culture   Final    NO GROWTH 1 DAY Performed at [facility], [address], [place], [place] [address]    Report Status PENDING  Incomplete  Blood Culture (routine x 2)     Status: None (Preliminary result)   Collection Time: 07/11/18  7:08 PM  Result Value Ref Range Status   Specimen Description   Final    BLOOD RIGHT HAND Performed at [facility], [address], [place], [place] [address]    Special Requests   Final    BOTTLES DRAWN AEROBIC AND ANAEROBIC Blood Culture results may not be optimal due to an excessive volume of blood received in culture bottles Performed at [facility], [address], [place], [place] [address]    Culture   Final    NO GROWTH 1  DAY Performed at [facility], [address] [place], [place] [address]    Report Status PENDING  Incomplete         Radiology Studies: Dg Chest 2 View  Result Date: 07/11/2018 CLINICAL DATA:  Cough. EXAM: CHEST - 2 VIEW COMPARISON:  Chest x-ray dated January 20, 2008. FINDINGS: The heart size and mediastinal contours are within normal limits. Both lungs are clear. The visualized skeletal structures are unremarkable. IMPRESSION: No active cardiopulmonary disease. Electronically Signed   By: Obie Dredge M.D.   On: 07/11/2018 19:56        Scheduled Meds: . baclofen  30 mg Oral QPC breakfast  . dalfampridine  10 mg Oral BID  . enoxaparin (LOVENOX) injection  40 mg Subcutaneous QHS  . hydrOXYzine  25 mg Oral  QHS  . oseltamivir  75 mg Oral BID  . phentermine  37.5 mg Oral QAC breakfast   Continuous Infusions: . sodium chloride 100 mL/hr at 07/12/18 2221     LOS: 2 days        Barnetta ChapelSylvester I Marcquis Ridlon, MD [facility] Pager [phone]  If 7PM-7AM, please contact night-coverage [email or web address] Password [email or web address] 07/13/2018, 1:13 PM

## 2018-07-14 NOTE — Progress Notes (Signed)
Patient ID: Lindsay Schneider, female   DOB: Feb 17, 1986, 32 y.o.   MRN: [id number]  PROGRESS NOTE    Lindsay Schneider  YIA:[id number] DOB: 1985-11-14 DOA: 07/11/2018 PCP: Dorothyann Peng, MD   Brief Narrative:  32 year old female with multiple sclerosis, wheelchair-bound presented with muscle spasm along with fevers, chills, body aches.  She was found to have influenza.  She was started on IV fluids and Tamiflu.  07/13/2018: Patient reports some improvement.  Fever is improving.  However, patient continues to have cough that is productive of yellowish-greenish sputum.  07/14/2018: Patient continues to improve.  Patient is noted keen on being discharged back on today.  Hopefully, patient will be discharged back in the morning.   Assessment & Plan:   Active Problems:   Sepsis, viral (HCC)   Sepsis secondary to influenza B -Patient feels better but does not feel ready to go home yet.  Still having some fevers.  Appetite not improved yet.  Will resume normal saline at 100 cc an hour. -Follow cultures -Repeat a.m. labs 07/13/2018: Patient is still having fever.  Continue Tamiflu for now.  Blood pressure to add IV vancomycin. 07/14/2018: Pursue disposition in the morning.  Influenza B with acute bronchitis -Continue Tamiflu -Please see above.  Leukocytosis -Resolved  Hypokalemia -Replace.  Repeat a.m. labs  History of multiple sclerosis -Continue home medications.  We will add Robaxin as needed for spasm  Doubt that patient has UTI -No urinary symptoms.  UA is unremarkable.  Discontinue Rocephin  DVT prophylaxis: Lovenox Code Status: Full Family Communication: Spoke to patient and aunt at bedside Disposition Plan: Home in 1 to 2 days  Consultants: None  Procedures: None  Antimicrobials: Rocephin from 07/11/2018 onwards   Subjective: Patient seen. No new complaints today.  Patient looks a lot better today.     Objective: Vitals:   07/13/18 1447 07/13/18 2057  07/14/18 0444 07/14/18 1235  BP: 122/80 (!) 131/102 108/82 115/87  Pulse: 97 99 99 97  Resp: 16 (!) 24 18 16   Temp: 98.4 F (36.9 C) 98.7 F (37.1 C) 98.7 F (37.1 C) 98.9 F (37.2 C)  TempSrc: Oral Oral Oral Oral  SpO2: 100% 97% 97% 98%  Weight:      Height:        Intake/Output Summary (Last 24 hours) at 07/14/2018 1820 Last data filed at 07/14/2018 1755 Gross per 24 hour  Intake 1872.29 ml  Output 2750 ml  Net -877.71 ml   Filed Weights   07/11/18 1959 07/12/18 1309  Weight: 68 kg 65.3 kg    Examination:  General exam: Appears calm and comfortable.  Patient is functional paraplegic. Respiratory system: Decreased air entry. Cardiovascular system: S1 & S2 heard, tachycardic Gastrointestinal system: Abdomen is nondistended, soft and nontender. Normal bowel sounds heard. Extremities: No cyanosis, clubbing, edema.  Left upper extremity contracture Neuro: Awake and alert.  Patient is functional paraplegic.  Data Reviewed: I have personally reviewed following labs and imaging studies  CBC: Recent Labs  Lab 07/11/18 1908 07/12/18 0606 07/13/18 0534  WBC 11.8* 8.3 8.0  NEUTROABS 8.7*  --  3.1  HGB 11.8* 10.3* 10.7*  HCT 36.1 31.6* 33.8*  MCV 84.5 84.3 86.9  PLT 209 174 160   Basic Metabolic Panel: Recent Labs  Lab 07/11/18 1908 07/12/18 0606 07/13/18 0534  NA 137 143 139  K 3.2* 3.1* 3.7  CL 104 112* 108  CO2 22 20* 22  GLUCOSE 103* 77 119*  BUN <5* <5* <5*  CREATININE  0.57 0.52 0.63  CALCIUM 8.9 7.5* 8.2*  MG  --   --  1.9   GFR: Estimated Creatinine Clearance: 91.2 mL/min (by C-G formula based on SCr of 0.63 mg/dL). Liver Function Tests: Recent Labs  Lab 07/11/18 1908 07/12/18 0606  AST 19 24  ALT 17 17  ALKPHOS 66 49  BILITOT 1.0 0.8  PROT 7.1 5.5*  ALBUMIN 4.0 3.1*   No results for input(s): LIPASE, AMYLASE in the last 168 hours. No results for input(s): AMMONIA in the last 168 hours. Coagulation Profile: Recent Labs  Lab  07/11/18 1908  INR 1.09   Cardiac Enzymes: No results for input(s): CKTOTAL, CKMB, CKMBINDEX, TROPONINI in the last 168 hours. BNP (last 3 results) No results for input(s): PROBNP in the last 8760 hours. HbA1C: No results for input(s): HGBA1C in the last 72 hours. CBG: No results for input(s): GLUCAP in the last 168 hours. Lipid Profile: No results for input(s): CHOL, HDL, LDLCALC, TRIG, CHOLHDL, LDLDIRECT in the last 72 hours. Thyroid Function Tests: No results for input(s): TSH, T4TOTAL, FREET4, T3FREE, THYROIDAB in the last 72 hours. Anemia Panel: No results for input(s): VITAMINB12, FOLATE, FERRITIN, TIBC, IRON, RETICCTPCT in the last 72 hours. Sepsis Labs: Recent Labs  Lab 07/11/18 1917  LATICACIDVEN 0.83    Recent Results (from the past 240 hour(s))  Culture, blood (Routine x 2)     Status: None (Preliminary result)   Collection Time: 07/11/18  7:08 PM  Result Value Ref Range Status   Specimen Description   Final    BLOOD RIGHT FOREARM Performed at [facility], [address] [place], [place] [address]    Special Requests   Final    BOTTLES DRAWN AEROBIC AND ANAEROBIC Blood Culture results may not be optimal due to an excessive volume of blood received in culture bottles Performed at [facility], [address] [place], [place] [address]    Culture   Final    NO GROWTH 2 DAYS Performed at [facility], [address] [place], [place] [address]    Report Status PENDING  Incomplete  Blood Culture (routine x 2)     Status: None (Preliminary result)   Collection Time: 07/11/18  7:08 PM  Result Value Ref Range Status   Specimen Description   Final    BLOOD RIGHT HAND Performed at [facility], [address] [place], [place] [address]    Special Requests   Final    BOTTLES DRAWN AEROBIC AND ANAEROBIC Blood Culture results may not be optimal due to an excessive volume of blood received in culture bottles Performed at [facility], [address] [place], [place] [address]    Culture   Final    NO GROWTH 2 DAYS Performed at [facility], [address] [place], [place] [address]    Report Status PENDING  Incomplete         Radiology Studies: No results found.      Scheduled Meds: . baclofen  30 mg Oral QPC breakfast  . dalfampridine  10 mg Oral BID  . enoxaparin (LOVENOX) injection  40 mg Subcutaneous QHS  . hydrOXYzine  25 mg Oral QHS  . oseltamivir  75 mg Oral BID  . phentermine  37.5 mg Oral QAC breakfast   Continuous Infusions: . sodium chloride 100 mL/hr at 07/14/18 1240     LOS: 3 days        Jacqlyn KraussSylvester  Rockwell Alexandria, MD [facility] Pager [phone]  If 7PM-7AM, please contact night-coverage [email or web address] Password [facility] 07/14/2018, 6:20 PM

## 2018-07-15 ENCOUNTER — Ambulatory Visit: Payer: Medicare Other | Admitting: Neurology

## 2018-07-15 ENCOUNTER — Telehealth: Payer: Self-pay | Admitting: *Deleted

## 2018-07-15 NOTE — Progress Notes (Signed)
Patients medications obtained from pharmacy and returned to patient.  Signed white copies of home med sheets placed on chart

## 2018-07-15 NOTE — Telephone Encounter (Signed)
Received fax notification from touch prescribing program that pt re-authorized 07/15/18-02/14/19. Pt enrollment number: [id number]. Account: [facility]. Site auth number: [id number].

## 2018-07-15 NOTE — Care Management Important Message (Signed)
Important Message  Patient Details  Name: Lindsay Schneider MRN: [id number] Date of Birth: [birth year]   Medicare Important Message Given:  Yes    Caren Macadam 07/15/2018, 10:47 AMImportant Message  Patient Details  Name: Lindsay Schneider MRN: [id number] Date of Birth: [birth year]   Medicare Important Message Given:  Yes    Caren Macadam 07/15/2018, 10:47 AM

## 2018-07-15 NOTE — Telephone Encounter (Signed)
Faxed completed/signed Tysabri pt status report and reauth questionnaire to [facility] at [phone]. Received confirmation.  

## 2018-07-15 NOTE — Progress Notes (Signed)
Patient discharged home.  IV removed - WNL.  Reviewed AVS and meds - instructed to follow up with PCP in 1 week.  Verbalizes understanding, no questions at this time, patient in NAD.  Assisted off unit by NT

## 2018-07-15 NOTE — Discharge Summary (Signed)
Physician Discharge Summary  Patient ID: AGAM WRUBEL MRN: [id number] DOB/AGE: September 22, 1985 32 y.o.  Admit date: 07/11/2018 Discharge date: 07/15/2018  Admission Diagnoses:  Discharge Diagnoses:  Active Problems:   Sepsis, viral (HCC)   Discharged Condition: stable  Hospital Course: 32 year old female with multiple sclerosis, wheelchair-bound presented with muscle spasm along with fevers, chills, body aches.  She was found to have influenza.  She was started on IV fluids and Tamiflu.  Sepsis/SIRS secondary to influenza B: -Patient was managed with Tamiflu during the hospital stay, as well as supportive care.  Influenza B with acute bronchitis: -Treated with Tamiflu  Leukocytosis: -Resolved  Hypokalemia: -Repleted during the hospital stay.   History of multiple sclerosis: -Continued home medications.   -Added Robaxin as needed for spasm  Doubt that patient had UTI: -No urinary symptoms.   -UA is unremarkable.   -Discontinued Rocephin   Consults: None  Significant Diagnostic Studies: Positive for influenza B by PCR.  Discharge Exam: Blood pressure 118/73, pulse 94, temperature 98.4 F (36.9 C), temperature source Oral, resp. rate 18, height 5\' 2"  (1.575 m), weight 65.3 kg, last menstrual period 05/28/2018, SpO2 92 %.   Disposition: Discharge disposition: 01-Home or Self Care   Discharge Instructions    Diet - low sodium heart healthy   Complete by:  As directed    Increase activity slowly   Complete by:  As directed      Allergies as of 07/15/2018   No Known Allergies     Medication List    STOP taking these medications   meloxicam 7.5 MG tablet Commonly known as:  MOBIC     TAKE these medications   baclofen 10 MG tablet Commonly known as:  LIORESAL Take 1 tablet (10 mg total) by mouth 3 (three) times daily. What changed:    how much to take  when to take this   dalfampridine 10 MG Tb12 Commonly known as:  AMPYRA Take 1 tablet  (10 mg total) by mouth 2 (two) times daily.   hydrOXYzine 25 MG tablet Commonly known as:  ATARAX/VISTARIL Take 25 mg by mouth at bedtime.   natalizumab 300 MG/15ML injection Commonly known as:  TYSABRI Inject 300 mg into the vein every 30 (thirty) days.   phentermine 37.5 MG tablet Commonly known as:  ADIPEX-P TAKE 1 TABLET BY MOUTH EVERY MORNING What changed:  when to take this   triamcinolone 0.025 % ointment Commonly known as:  KENALOG Apply 1 application topically 2 (two) times daily as needed (rash).        SignedBarnetta Chapel 07/15/2018, 1:00 PM

## 2018-07-17 LAB — CULTURE, BLOOD (ROUTINE X 2)
Culture: NO GROWTH
Culture: NO GROWTH

## 2018-07-23 DIAGNOSIS — L308 Other specified dermatitis: Secondary | ICD-10-CM | POA: Diagnosis not present

## 2018-08-07 ENCOUNTER — Encounter (HOSPITAL_COMMUNITY)
Admission: RE | Admit: 2018-08-07 | Payer: Medicare Other | Source: Ambulatory Visit | Attending: Neurology | Admitting: Neurology

## 2018-08-09 ENCOUNTER — Other Ambulatory Visit: Payer: Self-pay | Admitting: Neurology

## 2018-08-16 ENCOUNTER — Other Ambulatory Visit: Payer: Self-pay | Admitting: Neurology

## 2018-08-22 ENCOUNTER — Encounter (HOSPITAL_COMMUNITY): Payer: Medicare Other

## 2018-08-27 ENCOUNTER — Encounter (HOSPITAL_COMMUNITY): Payer: Medicare Other

## 2018-09-02 ENCOUNTER — Other Ambulatory Visit (HOSPITAL_COMMUNITY): Payer: Self-pay | Admitting: General Practice

## 2018-09-04 ENCOUNTER — Other Ambulatory Visit (HOSPITAL_COMMUNITY): Payer: Self-pay | Admitting: General Practice

## 2018-09-04 ENCOUNTER — Telehealth: Payer: Self-pay | Admitting: *Deleted

## 2018-09-04 NOTE — Telephone Encounter (Signed)
Called, LVM for pt to call and schedule f/u in the next month with Amy, NP (as long as she has no new sx). She was last seen by [name] 08/28/17 and has had no f/u since. She cx appt on 02/26/18 and 04/04/18 d/t lack of transportation. She needs repeat labs and evaluation to continue receiving Tysabri infusions at [facility] short stay per [name].   Last JCV 08/28/17 indeterminate, titer: 0.27. Inhibition assay: negative.  Her next tysabri infusion is scheduled for 09/09/18 at [facility]. I faxed back signed order by [name] to them. Fax: [phone]. Received fax confirmation.

## 2018-09-05 NOTE — Telephone Encounter (Signed)
Called pt again. She did not received VM from yesterday. Made earlier f/u for 09/20/18 at 9am. Cx appt for 10/2018. Expressed importance of her coming to appt to have repeat labs/exam. Advised Tysabri high risk medication and it is important for her to f/u every 6 months with [name]. She verbalized understanding and appreciation.

## 2018-09-06 ENCOUNTER — Encounter (HOSPITAL_COMMUNITY): Payer: Medicare Other

## 2018-09-06 DIAGNOSIS — L810 Postinflammatory hyperpigmentation: Secondary | ICD-10-CM | POA: Diagnosis not present

## 2018-09-06 DIAGNOSIS — L308 Other specified dermatitis: Secondary | ICD-10-CM | POA: Diagnosis not present

## 2018-09-09 ENCOUNTER — Encounter (HOSPITAL_COMMUNITY): Payer: Self-pay

## 2018-09-09 ENCOUNTER — Encounter (HOSPITAL_COMMUNITY)
Admission: RE | Admit: 2018-09-09 | Discharge: 2018-09-09 | Disposition: A | Discharge status: Home / Self Care | Payer: Medicare Other | Source: Ambulatory Visit | Attending: Neurology | Admitting: Neurology

## 2018-09-09 DIAGNOSIS — G35 Multiple sclerosis: Secondary | ICD-10-CM | POA: Insufficient documentation

## 2018-09-09 MED ORDER — SODIUM CHLORIDE 0.9 % IV SOLN
300.0000 mg | INTRAVENOUS | Status: DC
  Administered 2018-09-09: 300 mg via INTRAVENOUS
  Filled 2018-09-09: qty 15

## 2018-09-09 MED ORDER — SODIUM CHLORIDE 0.9 % IV SOLN
INTRAVENOUS | Status: AC
  Administered 2018-09-09: 11:00:00 via INTRAVENOUS

## 2018-09-09 NOTE — Progress Notes (Signed)
Tolerated infusion well and stayed 15 minutes of recommended 1 hour post observation time.

## 2018-09-20 ENCOUNTER — Telehealth: Payer: Self-pay | Admitting: *Deleted

## 2018-09-20 ENCOUNTER — Encounter: Payer: Self-pay | Admitting: Neurology

## 2018-09-20 ENCOUNTER — Telehealth: Payer: Self-pay | Admitting: Neurology

## 2018-09-20 ENCOUNTER — Ambulatory Visit (INDEPENDENT_AMBULATORY_CARE_PROVIDER_SITE_OTHER): Payer: Medicare Other | Admitting: Neurology

## 2018-09-20 VITALS — BP 128/80 | HR 82

## 2018-09-20 DIAGNOSIS — G8114 Spastic hemiplegia affecting left nondominant side: Secondary | ICD-10-CM | POA: Diagnosis not present

## 2018-09-20 DIAGNOSIS — R5383 Other fatigue: Secondary | ICD-10-CM

## 2018-09-20 DIAGNOSIS — Z79899 Other long term (current) drug therapy: Secondary | ICD-10-CM | POA: Diagnosis not present

## 2018-09-20 DIAGNOSIS — G35 Multiple sclerosis: Secondary | ICD-10-CM

## 2018-09-20 DIAGNOSIS — E559 Vitamin D deficiency, unspecified: Secondary | ICD-10-CM

## 2018-09-20 DIAGNOSIS — G4700 Insomnia, unspecified: Secondary | ICD-10-CM

## 2018-09-20 MED ORDER — OXYBUTYNIN CHLORIDE 5 MG PO TABS: 5.0000 mg | ORAL_TABLET | Freq: Two times a day (BID) | ORAL | 11 refills | Status: DC

## 2018-09-20 MED ORDER — LORAZEPAM 1 MG PO TABS: ORAL_TABLET | ORAL | 5 refills | Status: DC

## 2018-09-20 MED ORDER — LORAZEPAM 1 MG PO TABS: 1.0000 mg | ORAL_TABLET | Freq: Three times a day (TID) | ORAL | 5 refills | Status: DC

## 2018-09-20 MED ORDER — BACLOFEN 10 MG PO TABS: ORAL_TABLET | ORAL | 11 refills | Status: DC

## 2018-09-20 MED ORDER — PHENTERMINE HCL 37.5 MG PO TABS: ORAL_TABLET | ORAL | 5 refills | Status: DC

## 2018-09-20 NOTE — Telephone Encounter (Signed)
Placed JCV lab in [facility] lock box for routine lab pick up. Results pending. 

## 2018-09-20 NOTE — Telephone Encounter (Signed)
Our clinic closes at 12pm and we lock the doors at that time also. Patient's aunt Madyx Whynot, starts banging on our doors and screaming, "I left my pocket book, let me in." So I go to open the door for her. Patient continues to scream and bang on the door. I then tell her, "Hold on, I am coming." I open the door and she comes into the lobby and states "I left my pocket book, I need to get it." I told her that was fine and she could get it. She then says "I needed my pocketbook, I ain't got time for all that mouth." Then she turns to a patient also in the lobby and says "I ain't got time for you hoes, why didn't you open the door for me either?" Then she proceeds to say the same to me. She tells me "If you don't want to work, stay home. I ain't got time for you hoes." I then tell the Lindsay Schneider to leave and next time she will not be let back in.   Not only did Lindsay Schneider the patient's aunt, call me a "hoe", she called another patient in the lobby who was accompanied by her 72 year old daughter a "hoe". Please review this disruptive behavior. [name] actions were uncalled for and made a scene in our lobby. I apologized to patient in our lobby after Lindsay Schneider left. However, patient is very pleasant and we enjoy having her as a patient.

## 2018-09-20 NOTE — Progress Notes (Signed)
[facility]  PATIENT: Lindsay Schneider DOB: 1986/06/28  REFERRING DOCTOR OR PCP:   SOURCE:   _________________________________   HISTORICAL  CHIEF COMPLAINT:  Chief Complaint  Patient presents with  . Follow-up    RM 13 with mother. Last seen 08/28/17. Still having weakness in legs and left arm. No falls since last seen.   . Multiple Sclerosis    On Tysabri. Last tysabri infusion was 09/09/18 at [facility] short stay. Next one scheduled for 10/07/18. Last JCV checked 08/28/17, indeterminate 0.27, inhibition assay: negative. NEEDS JCV LAB TODAY    HISTORY OF PRESENT ILLNESS:  Earle Sierzega is a 33 y.o. woman who was diagnosed with MS in 2011 after presenting with left sided arm and leg numbness and weakness.     Update 09/20/2018: Her MS has been stable.   She is on Tysabri and tolerates it well.  She is JCV negative (0.27; neg inhib assay).     No exacerbations.    She is wheelchair bound and needs help with transfers.     Her right arm is strong but she has bilateral leg and left arm weakness and spasticity.      Vision is doing well.    She denies any problems with color vision.      No vertigo.     She has bladder urgency and frequency.    She has nocturia.    She feels fatigue is better on phentermine.   She tolerates it well.   Focus and attention are better on Adderall.   She is back in school.      She has some sleep onset insomnia but sleep maintenance is reasonably good.        Mood is doing well.    She is seeing a dermatologist for a rash and is on Kenalog cream.     Update 08/28/2017: She has no new MS exacerbations and impairments are stable.  She tolerates Tysabri well.  She actually has done slightly better this year.   She can dress independently now and was requiring help to get pants on.   She has severe leg weakness, moderate left arm weakness, all with spasticity.   Her right arm is strong      She has a rash on her arms progressing over the past couple  weeks.   She has dark roundish spots from head to legs.   She had a biopsy at [facility] yesterday.    No treatments were provided yet.     From 12/27/2016: MS:   She is on Tysabri. Her JCV Ab titers have been negative.   She tolerates Tysabri well.  She has had no definite exacerbation.       She has been on Tysabri for about 18 months but had been on it previously as well.  Gait/strength/sensation: She has had more weakness in her legs, left worse than right.   Her left arm is weak but right arm is strong.    She is whheelchair bound and has needed more help with transfers.    She also has left side spasticity.   Handwriting is mildly better (right hand) but she tires out and her hand shakes when that occurs.     She takes baclofen 90 mg a day and tizanidine only once a day .   Higher dose was not well tolerated    Sometimes, she hgets severe right leg spasms where the leg locks up.   This mostly  occurs when she is being moved.     Bladder:   She has difficulty with her bladder. She has urinary urgency and frequency and will have occasional incontinence if she cannot get to the bathroom and transfer in time.  Oxybutynin only helpe a little bit so she stopped.     Vision:   She denies any difficulty with her vision.  Vision is blurry in the mornings.   .    Fatigue:   She has physical fatigue.   Her fatigue improved when phentermine was started.   She also has been able to lose a couple pounds.  She is sleeping ok most nights on tizanidine/baclofen combo.    Mood/cognition:   Mood is ok with only occasional mild depression and no anxiety.     She denies any major difficulties with cognitive function. She has difficulty focusing but is doing better with phentermine.  Left hip pain:   She has more pain in the left hip.    MS History:  In 2011, onset of left arm and leg numbness and weakness.  Her symptoms develop over 1 day. She had an MRI performed with lesions consistent with MS. She started to  see Dr. Terrace Arabia here at [facility]. She was placed on Rebif. However, she stopped because her legs felt weaker when she was on it. She then transferred her care to Dr. Renne Crigler at [facility].     She was started on Tysabri.  She had several infusions of Tysabri but transportation was difficult to get to [facility]. Therefore, Dr. Renne Crigler switched her to Rituxan although she tolerated the medication well. She had a significant relapse with bilateral leg weakness within a couple weeks of the infusion. Therefore, 6 months later, she started Tysabri again. Due to difficulties with transportation, she transferred care to [place] in October 2016.   REVIEW OF SYSTEMS: Constitutional: No fevers, chills, sweats, or change in appetite.   She has fatigue and poor sleep.    Eyes: No visual changes, double vision, eye pain Ear, nose and throat: No hearing loss, ear pain, nasal congestion, sore throat Cardiovascular: No chest pain, palpitations Respiratory: No shortness of breath at rest or with exertion.   No wheezes GastrointestinaI: No nausea, vomiting, diarrhea, abdominal pain, fecal incontinence Genitourinary: Shehas urinary urgency and frequency with occ incontinence.    Hasnocturia. Musculoskeletal: No neck pain, back pain Integumentary: No rash, pruritus, skin lesions Neurological: as above Psychiatric:   Some depression at this time.  No anxiety Endocrine: No palpitations, diaphoresis, change in appetite, change in weigh or increased thirst Hematologic/Lymphatic: No anemia, purpura, petechiae. Allergic/Immunologic: No itchy/runny eyes, nasal congestion, recent allergic reactions, rashes  ALLERGIES: No Known Allergies  HOME MEDICATIONS:  Current Outpatient Medications:  .  baclofen (LIORESAL) 10 MG tablet, Take one or two pills 3 times a day (up to 6/day), Disp: 180 tablet, Rfl: 11 .  dalfampridine 10 MG TB12, TAKE ONE TABLET BY MOUTH TWICE DAILY (APPROXIMATELY 12 HOURS APART). MAY BE TAKEN WITH OR WITHOUT FOOD. DO  NOT CUT OR CRUSH. STORE AT ROOM TEMPERATURE, Disp: 180 tablet, Rfl: 1 .  hydrOXYzine (ATARAX/VISTARIL) 25 MG tablet, Take 25 mg by mouth at bedtime. , Disp: , Rfl: 0 .  natalizumab (TYSABRI) 300 MG/15ML injection, Inject 300 mg into the vein every 30 (thirty) days., Disp: , Rfl:  .  phentermine (ADIPEX-P) 37.5 MG tablet, TAKE 1 TAB BY MOUTH EVERY DAY IN THE MORNING.  Please keep pending appt on 10/23/2018., Disp: 30 tablet,  Rfl: 5 .  triamcinolone (KENALOG) 0.025 % ointment, Apply 1 application topically 2 (two) times daily as needed (rash). , Disp: , Rfl: 0 .  LORazepam (ATIVAN) 1 MG tablet, One po qHS prn, Disp: 30 tablet, Rfl: 5 .  oxybutynin (DITROPAN) 5 MG tablet, Take 1 tablet (5 mg total) by mouth 2 (two) times daily., Disp: 60 tablet, Rfl: 11  PAST MEDICAL HISTORY: Past Medical History:  Diagnosis Date  . Movement disorder   . MS (multiple sclerosis) (HCC)     PAST SURGICAL HISTORY: Past Surgical History:  Procedure Laterality Date  . CESAREAN SECTION      FAMILY HISTORY: Family History  Problem Relation Age of Onset  . Asthma Mother   . Hypertension Father   . Healthy Sister   . Healthy Brother     SOCIAL HISTORY:  Social History   Socioeconomic History  . Marital status: Single    Spouse name: Not on file  . Number of children: Not on file  . Years of education: Not on file  . Highest education level: Not on file  Occupational History  . Not on file  Social Needs  . Financial resource strain: Not on file  . Food insecurity:    Worry: Not on file    Inability: Not on file  . Transportation needs:    Medical: Not on file    Non-medical: Not on file  Tobacco Use  . Smoking status: Never Smoker  . Smokeless tobacco: Never Used  Substance and Sexual Activity  . Alcohol use: No  . Drug use: No  . Sexual activity: Not Currently    Birth control/protection: Injection  Lifestyle  . Physical activity:    Days per week: Not on file    Minutes per session: Not  on file  . Stress: Not on file  Relationships  . Social connections:    Talks on phone: Not on file    Gets together: Not on file    Attends religious service: Not on file    Active member of club or organization: Not on file    Attends meetings of clubs or organizations: Not on file    Relationship status: Not on file  . Intimate partner violence:    Fear of current or ex partner: Not on file    Emotionally abused: Not on file    Physically abused: Not on file    Forced sexual activity: Not on file  Other Topics Concern  . Not on file  Social History Narrative  . Not on file     PHYSICAL EXAM  Vitals:   09/20/18 0856  BP: 128/80  Pulse: 82  SpO2: 99%    There is no height or weight on file to calculate BMI.   General: The patient is well-developed and well-nourished and in no acute distress  Skin:   She has a hyperpigmented rash on the arms, legs and trunk.  Neurologic Exam  Mental status: The patient is alert and oriented x 3 at the time of the examination. The patient has apparent normal recent and remote memory, with an apparently normal attention span and concentration ability.   Speech is normal.  Cranial nerves: Extraocular movements are full.. Facial strength and sensation is normal. Trapezius strength is normal.  The tongue is midline, and the patient has symmetric elevation of the soft palate. No obvious hearing deficits are noted.  Motor: Muscle bulk is normal.  Muscle tone is greatly increased in the left  arm and both legs, left greater than right.   Right arm with good tone/strength.  Strength is 3 to 4-/5 in the left distal arm and 2-3 in the proximal arm. Strength is 2 to 2+ in proximal right leg and 2- in proximal left leg and 3 in right knee extension and 2+ in left knee extension , 2+ right and  1 left ankle extensors,    Sensory: Intact sensation to touch and vibration in the arms.  She has reduced vibration sensation in the left leg.  Coordination:  Cerebellar testing reveals poor left finger-nose-finger and she can't do heel-to-shin bilaterally.  Gait and station:  She is unable to stand without report and cannot walk.      Reflexes: Deep tendon reflexes are increased in arms, left > right and in legs with spread att knees and sustained clonus at the ankles.       DIAGNOSTIC DATA (LABS, IMAGING, TESTING) - I reviewed patient records, labs, notes, testing and imaging myself where available.  Lab Results  Component Value Date   WBC 8.0 07/13/2018   HGB 10.7 (L) 07/13/2018   HCT 33.8 (L) 07/13/2018   MCV 86.9 07/13/2018   PLT 160 07/13/2018      Component Value Date/Time   NA 139 07/13/2018 0534   K 3.7 07/13/2018 0534   CL 108 07/13/2018 0534   CO2 22 07/13/2018 0534   GLUCOSE 119 (H) 07/13/2018 0534   BUN <5 (L) 07/13/2018 0534   CREATININE 0.63 07/13/2018 0534   CALCIUM 8.2 (L) 07/13/2018 0534   PROT 5.5 (L) 07/12/2018 0606   ALBUMIN 3.1 (L) 07/12/2018 0606   AST 24 07/12/2018 0606   ALT 17 07/12/2018 0606   ALKPHOS 49 07/12/2018 0606   BILITOT 0.8 07/12/2018 0606   GFRNONAA >60 07/13/2018 0534   GFRAA >60 07/13/2018 0534       ASSESSMENT AND PLAN  Multiple sclerosis (HCC) - Plan: Stratify JCV Antibody Test ([facility]), CBC with Differential/Platelet, Ambulatory referral to Physical Therapy, VITAMIN D 25 Hydroxy (Vit-D Deficiency, Fractures), MR BRAIN WO CONTRAST, MR CERVICAL SPINE WO CONTRAST  High risk medication use - Plan: Stratify JCV Antibody Test ([facility]), CBC with Differential/Platelet  Vitamin D deficiency - Plan: VITAMIN D 25 Hydroxy (Vit-D Deficiency, Fractures)  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    Continue Tysabri. We will check the JCV antibody today.  Also check CBC and vitamin D.  We also need to check an MRI of the brain and cervical spine to  determine if she is having subclinical progression.  If present, we will need to consider a change to a different disease modifying therapy. 2.    I will get her back on oxybutynin for her bladder twice a day.  Additionally I will prescribe lorazepam nightly to help with her insomnia and nighttime spasticity..  3.    Continue Phentermine for fatigue, attention/focusweight loss.    4.    Return in 6 months or sooner if there are new or worsening neurologic symptoms.    Jaquille Kau A. Epimenio Foot, MD, PhD 09/20/2018, 9:42 AM Certified in Neurology, Clinical Neurophysiology, Sleep Medicine, Pain Medicine and Neuroimaging  [facility] [address] [place], [place] [address] [phone]

## 2018-09-20 NOTE — Telephone Encounter (Signed)
Medicare/medicaid order sent to [facility]. No auth they will reach out to the pt to schedule.  °

## 2018-09-21 LAB — CBC WITH DIFFERENTIAL/PLATELET
Basophils Absolute: 0 10*3/uL (ref 0.0–0.2)
Basos: 0 %
EOS (ABSOLUTE): 1 10*3/uL — ABNORMAL HIGH (ref 0.0–0.4)
Eos: 10 %
Hematocrit: 32.3 % — ABNORMAL LOW (ref 34.0–46.6)
Hemoglobin: 10.6 g/dL — ABNORMAL LOW (ref 11.1–15.9)
Immature Grans (Abs): 0.1 10*3/uL (ref 0.0–0.1)
Immature Granulocytes: 1 %
Lymphocytes Absolute: 3.4 10*3/uL — ABNORMAL HIGH (ref 0.7–3.1)
Lymphs: 34 %
MCH: 27.9 pg (ref 26.6–33.0)
MCHC: 32.8 g/dL (ref 31.5–35.7)
MCV: 85 fL (ref 79–97)
Monocytes Absolute: 0.6 10*3/uL (ref 0.1–0.9)
Monocytes: 7 %
NEUTROS ABS: 4.8 10*3/uL (ref 1.4–7.0)
NRBC: 1 % — ABNORMAL HIGH (ref 0–0)
Neutrophils: 48 %
Platelets: 204 10*3/uL (ref 150–450)
RBC: 3.8 x10E6/uL (ref 3.77–5.28)
RDW: 14.7 % (ref 11.7–15.4)
WBC: 9.8 10*3/uL (ref 3.4–10.8)

## 2018-09-21 LAB — VITAMIN D 25 HYDROXY (VIT D DEFICIENCY, FRACTURES): Vit D, 25-Hydroxy: 5 ng/mL — ABNORMAL LOW (ref 30.0–100.0)

## 2018-09-23 ENCOUNTER — Telehealth: Payer: Self-pay | Admitting: *Deleted

## 2018-09-23 MED ORDER — VITAMIN D (ERGOCALCIFEROL) 1.25 MG (50000 UNIT) PO CAPS: 50000.0000 [IU] | ORAL_CAPSULE | ORAL | 3 refills | Status: DC

## 2018-09-23 NOTE — Telephone Encounter (Signed)
-----   Message from Asa Lente, MD sent at 09/22/2018  4:55 PM EST ----- Please let her know that the blood count looks fine.  The vitamin D is very low and I want her to take 50,000 units weekly times a year #13 #3

## 2018-09-23 NOTE — Telephone Encounter (Signed)
Called and spoke with pt about lab results per [name] note. Pt verbalized understanding. I escribed Vit D rx to [id number] - [place],  - [address].

## 2018-09-23 NOTE — Telephone Encounter (Signed)
I called patient to discuss the encounter on Friday. Patient stated that she was not here when that happened. Her aunt came back to get her pocketbook but did not say anything to her. I told her to let her aunt know that she could call me if needed.

## 2018-09-30 NOTE — Telephone Encounter (Signed)
JCV ab indeterminate, index: 0.28. Inhibition assay: negative.

## 2018-10-01 ENCOUNTER — Telehealth: Payer: Self-pay | Admitting: *Deleted

## 2018-10-01 NOTE — Telephone Encounter (Signed)
Dalfampridine ER 10mg  shipped to pt 09/25/18 per [facility] fax we received.

## 2018-10-02 ENCOUNTER — Ambulatory Visit: Admission: RE | Admit: 2018-10-02 | Payer: Medicare Other | Source: Ambulatory Visit

## 2018-10-02 ENCOUNTER — Ambulatory Visit: Payer: Medicare Other

## 2018-10-04 ENCOUNTER — Encounter (HOSPITAL_COMMUNITY): Payer: Medicare Other

## 2018-10-07 ENCOUNTER — Encounter (HOSPITAL_COMMUNITY): Payer: Medicare Other

## 2018-10-23 ENCOUNTER — Ambulatory Visit: Payer: Medicare Other | Admitting: Neurology

## 2018-11-04 ENCOUNTER — Encounter (HOSPITAL_COMMUNITY): Payer: Medicare Other

## 2018-11-05 ENCOUNTER — Ambulatory Visit (HOSPITAL_COMMUNITY): Payer: Medicare Other

## 2018-11-06 ENCOUNTER — Telehealth: Payer: Self-pay | Admitting: *Deleted

## 2018-11-06 ENCOUNTER — Other Ambulatory Visit (HOSPITAL_COMMUNITY): Payer: Self-pay | Admitting: General Practice

## 2018-11-06 NOTE — Telephone Encounter (Signed)
Received fax notification from [facility] prescribing program that pt authorized from 08/16/18-02/14/19. Enrollment number: [id number]. Account: [facility]. Site auth number: [id number].

## 2018-11-08 ENCOUNTER — Other Ambulatory Visit (HOSPITAL_COMMUNITY): Payer: Self-pay | Admitting: General Practice

## 2018-12-02 ENCOUNTER — Encounter (HOSPITAL_COMMUNITY): Payer: Medicare Other

## 2018-12-03 ENCOUNTER — Inpatient Hospital Stay (HOSPITAL_COMMUNITY): Admission: RE | Admit: 2018-12-03 | Payer: Medicare Other | Source: Ambulatory Visit

## 2018-12-03 ENCOUNTER — Ambulatory Visit (HOSPITAL_COMMUNITY): Payer: Medicare Other

## 2018-12-25 ENCOUNTER — Encounter (HOSPITAL_COMMUNITY): Payer: Medicare Other

## 2019-01-03 ENCOUNTER — Ambulatory Visit (HOSPITAL_COMMUNITY)
Admission: RE | Admit: 2019-01-03 | Discharge: 2019-01-03 | Disposition: A | Discharge status: Home / Self Care | Payer: Medicare Other | Source: Ambulatory Visit | Attending: Neurology | Admitting: Neurology

## 2019-01-03 ENCOUNTER — Other Ambulatory Visit: Payer: Self-pay

## 2019-01-03 DIAGNOSIS — G35 Multiple sclerosis: Secondary | ICD-10-CM | POA: Diagnosis not present

## 2019-01-03 MED ORDER — SODIUM CHLORIDE 0.9 % IV SOLN
300.0000 mg | INTRAVENOUS | Status: DC
  Administered 2019-01-03: 300 mg via INTRAVENOUS
  Filled 2019-01-03: qty 15

## 2019-01-03 MED ORDER — SODIUM CHLORIDE 0.9 % IV SOLN
INTRAVENOUS | Status: DC | PRN
  Administered 2019-01-03: 250 mL via INTRAVENOUS

## 2019-01-03 NOTE — Progress Notes (Signed)
PATIENT CARE CENTER NOTE  Diagnosis: Multiple Sclerosis   Provider: Richard, Sater, MD   Procedure: Tysabri IV   Note: Patient received Tysabri infusion via PIV. Tolerated well with no adverse reaction. Patient did not want to stay the 1 hour post-infusion. Discharge instructions given. Vital signs stable. Alert, oriented and ambulatory at discharge.            

## 2019-01-03 NOTE — Discharge Instructions (Signed)
Natalizumab injection What is this medicine? NATALIZUMAB (na ta LIZ you mab) is used to treat relapsing multiple sclerosis. This drug is not a cure. It is also used to treat Crohn's disease. This medicine may be used for other purposes; ask your health care provider or pharmacist if you have questions. COMMON BRAND NAME(S): Tysabri What should I tell my health care provider before I take this medicine? They need to know if you have any of these conditions: -immune system problems -progressive multifocal leukoencephalopathy (PML) -an unusual or allergic reaction to natalizumab, other medicines, foods, dyes, or preservatives -pregnant or trying to get pregnant -breast-feeding How should I use this medicine? This medicine is for infusion into a vein. It is given by a health care professional in a hospital or clinic setting. A special MedGuide will be given to you by the pharmacist with each prescription and refill. Be sure to read this information carefully each time. Talk to your pediatrician regarding the use of this medicine in children. This medicine is not approved for use in children. Overdosage: If you think you have taken too much of this medicine contact a poison control center or emergency room at once. NOTE: This medicine is only for you. Do not share this medicine with others. What if I miss a dose? It is important not to miss your dose. Call your doctor or health care professional if you are unable to keep an appointment. What may interact with this medicine? -azathioprine -cyclosporine -interferon -6-mercaptopurine -methotrexate -steroid medicines like prednisone or cortisone -TNF-alpha inhibitors like adalimumab, etanercept, and infliximab -vaccines This list may not describe all possible interactions. Give your health care provider a list of all the medicines, herbs, non-prescription drugs, or dietary supplements you use. Also tell them if you smoke, drink alcohol, or use  illegal drugs. Some items may interact with your medicine. What should I watch for while using this medicine? Your condition will be monitored carefully while you are receiving this medicine. Visit your doctor for regular check ups. Tell your doctor or healthcare professional if your symptoms do not start to get better or if they get worse. Stay away from people who are sick. Call your doctor or health care professional for advice if you get a fever, chills or sore throat, or other symptoms of a cold or flu. Do not treat yourself. In some patients, this medicine may cause a serious brain infection that may cause death. If you have any problems seeing, thinking, speaking, walking, or standing, tell your doctor right away. If you cannot reach your doctor, get urgent medical care. What side effects may I notice from receiving this medicine? Side effects that you should report to your doctor or health care professional as soon as possible: -allergic reactions like skin rash, itching or hives, swelling of the face, lips, or tongue -breathing problems -changes in vision -chest pain -dark urine -depression, feelings of sadness -dizziness -general ill feeling or flu-like symptoms -irregular, missed, or painful menstrual periods -light-colored stools -loss of appetite, nausea -muscle weakness -problems with balance, talking, or walking -right upper belly pain -unusually weak or tired -yellowing of the eyes or skin Side effects that usually do not require medical attention (report to your doctor or health care professional if they continue or are bothersome): -aches, pains -headache -stomach upset -tiredness This list may not describe all possible side effects. Call your doctor for medical advice about side effects. You may report side effects to FDA at 1-800-FDA-1088. Where should I keep   my medicine? This drug is given in a hospital or clinic and will not be stored at home. NOTE: This sheet is  a summary. It may not cover all possible information. If you have questions about this medicine, talk to your doctor, pharmacist, or health care provider.  2019 Elsevier/Gold Standard (2008-08-29 13:33:21)  

## 2019-01-14 ENCOUNTER — Telehealth: Payer: Self-pay

## 2019-01-14 NOTE — Telephone Encounter (Signed)
Tysabri Re-auth form has been signed and faxed to [facility]. Fax # [phone], confirmation received.  Dr. Jannifer Franklin signed during Dr. Garth Bigness absence.

## 2019-02-13 ENCOUNTER — Other Ambulatory Visit (HOSPITAL_COMMUNITY): Payer: Self-pay | Admitting: *Deleted

## 2019-02-13 ENCOUNTER — Other Ambulatory Visit: Payer: Self-pay

## 2019-02-13 ENCOUNTER — Encounter (HOSPITAL_COMMUNITY): Payer: Medicare Other

## 2019-02-13 ENCOUNTER — Ambulatory Visit (HOSPITAL_COMMUNITY)
Admission: RE | Admit: 2019-02-13 | Discharge: 2019-02-13 | Disposition: A | Discharge status: Home / Self Care | Payer: Medicare Other | Source: Ambulatory Visit | Attending: Neurology | Admitting: Neurology

## 2019-02-13 DIAGNOSIS — G35 Multiple sclerosis: Secondary | ICD-10-CM | POA: Diagnosis not present

## 2019-02-13 MED ORDER — SODIUM CHLORIDE 0.9 % IV SOLN
300.0000 mg | INTRAVENOUS | Status: DC
  Administered 2019-02-13: 300 mg via INTRAVENOUS
  Filled 2019-02-13: qty 15

## 2019-02-13 MED ORDER — SODIUM CHLORIDE 0.9 % IV SOLN
INTRAVENOUS | Status: DC | PRN
  Administered 2019-02-13: 250 mL via INTRAVENOUS

## 2019-02-13 NOTE — Progress Notes (Signed)
PATIENT CARE CENTER NOTE  Diagnosis: Multiple Sclerosis   Provider: Arlice Colt, MD   Procedure: Fritzi Mandes IV   Note: Patient received Tysabri infusion via PIV. Tolerated well with no adverse reaction. Patient did not want to stay the 1 hour post-infusion. Discharge instructions given. Vital signs stable. Alert, oriented and ambulatory at discharge.

## 2019-02-13 NOTE — Discharge Instructions (Signed)
Natalizumab injection What is this medicine? NATALIZUMAB (na ta LIZ you mab) is used to treat relapsing multiple sclerosis. This drug is not a cure. It is also used to treat Crohn's disease. This medicine may be used for other purposes; ask your health care provider or pharmacist if you have questions. COMMON BRAND NAME(S): Tysabri What should I tell my health care provider before I take this medicine? They need to know if you have any of these conditions:  immune system problems  progressive multifocal leukoencephalopathy (PML)  an unusual or allergic reaction to natalizumab, other medicines, foods, dyes, or preservatives  pregnant or trying to get pregnant  breast-feeding How should I use this medicine? This medicine is for infusion into a vein. It is given by a health care professional in a hospital or clinic setting. A special MedGuide will be given to you by the pharmacist with each prescription and refill. Be sure to read this information carefully each time. Talk to your pediatrician regarding the use of this medicine in children. This medicine is not approved for use in children. Overdosage: If you think you have taken too much of this medicine contact a poison control center or emergency room at once. NOTE: This medicine is only for you. Do not share this medicine with others. What if I miss a dose? It is important not to miss your dose. Call your doctor or health care professional if you are unable to keep an appointment. What may interact with this medicine?  azathioprine  cyclosporine  interferon  6-mercaptopurine  methotrexate  steroid medicines like prednisone or cortisone  TNF-alpha inhibitors like adalimumab, etanercept, and infliximab  vaccines This list may not describe all possible interactions. Give your health care provider a list of all the medicines, herbs, non-prescription drugs, or dietary supplements you use. Also tell them if you smoke, drink  alcohol, or use illegal drugs. Some items may interact with your medicine. What should I watch for while using this medicine? Your condition will be monitored carefully while you are receiving this medicine. Visit your doctor for regular check ups. Tell your doctor or healthcare professional if your symptoms do not start to get better or if they get worse. Stay away from people who are sick. Call your doctor or health care professional for advice if you get a fever, chills or sore throat, or other symptoms of a cold or flu. Do not treat yourself. In some patients, this medicine may cause a serious brain infection that may cause death. If you have any problems seeing, thinking, speaking, walking, or standing, tell your doctor right away. If you cannot reach your doctor, get urgent medical care. What side effects may I notice from receiving this medicine? Side effects that you should report to your doctor or health care professional as soon as possible:  allergic reactions like skin rash, itching or hives, swelling of the face, lips, or tongue  breathing problems  changes in vision  chest pain  dark urine  depression, feelings of sadness  dizziness  general ill feeling or flu-like symptoms  irregular, missed, or painful menstrual periods  light-colored stools  loss of appetite, nausea  muscle weakness  problems with balance, talking, or walking  right upper belly pain  unusually weak or tired  yellowing of the eyes or skin Side effects that usually do not require medical attention (report to your doctor or health care professional if they continue or are bothersome):  aches, pains  headache  stomach upset    tiredness This list may not describe all possible side effects. Call your doctor for medical advice about side effects. You may report side effects to FDA at 1-800-FDA-1088. Where should I keep my medicine? This drug is given in a hospital or clinic and will not be  stored at home. NOTE: This sheet is a summary. It may not cover all possible information. If you have questions about this medicine, talk to your doctor, pharmacist, or health care provider.  2020 Elsevier/Gold Standard (2008-08-29 13:33:21)  

## 2019-02-17 ENCOUNTER — Encounter: Payer: Self-pay | Admitting: Family Medicine

## 2019-02-17 ENCOUNTER — Encounter (HOSPITAL_COMMUNITY): Payer: Medicare Other

## 2019-02-17 ENCOUNTER — Ambulatory Visit (INDEPENDENT_AMBULATORY_CARE_PROVIDER_SITE_OTHER): Payer: Medicare Other | Admitting: Family Medicine

## 2019-02-17 ENCOUNTER — Other Ambulatory Visit: Payer: Self-pay

## 2019-02-17 VITALS — BP 140/88 | HR 88 | Temp 98.5°F | Resp 14

## 2019-02-17 DIAGNOSIS — I10 Essential (primary) hypertension: Secondary | ICD-10-CM

## 2019-02-17 DIAGNOSIS — G8114 Spastic hemiplegia affecting left nondominant side: Secondary | ICD-10-CM

## 2019-02-17 DIAGNOSIS — G35 Multiple sclerosis: Secondary | ICD-10-CM | POA: Diagnosis not present

## 2019-02-17 DIAGNOSIS — R269 Unspecified abnormalities of gait and mobility: Secondary | ICD-10-CM

## 2019-02-17 MED ORDER — HYDROCHLOROTHIAZIDE 12.5 MG PO TABS: 12.5000 mg | ORAL_TABLET | Freq: Every day | ORAL | 3 refills | Status: DC

## 2019-02-17 NOTE — Patient Instructions (Addendum)
I placed a referral for Home Health to come for evaluation. Remember to elevate your legs as much as you can while seated. I also started you on a diuretic pill that will help reduce fluid.   DASH Eating Plan DASH stands for "Dietary Approaches to Stop Hypertension." The DASH eating plan is a healthy eating plan that has been shown to reduce high blood pressure (hypertension). It may also reduce your risk for type 2 diabetes, heart disease, and stroke. The DASH eating plan may also help with weight loss. What are tips for following this plan?  General guidelines  Avoid eating more than 2,300 mg (milligrams) of salt (sodium) a day. If you have hypertension, you may need to reduce your sodium intake to 1,500 mg a day.  Limit alcohol intake to no more than 1 drink a day for nonpregnant women and 2 drinks a day for men. One drink equals 12 oz of beer, 5 oz of wine, or 1 oz of hard liquor.  Work with your health care provider to maintain a healthy body weight or to lose weight. Ask what an ideal weight is for you.  Get at least 30 minutes of exercise that causes your heart to beat faster (aerobic exercise) most days of the week. Activities may include walking, swimming, or biking.  Work with your health care provider or diet and nutrition specialist (dietitian) to adjust your eating plan to your individual calorie needs. Reading food labels   Check food labels for the amount of sodium per serving. Choose foods with less than 5 percent of the Daily Value of sodium. Generally, foods with less than 300 mg of sodium per serving fit into this eating plan.  To find whole grains, look for the word "whole" as the first word in the ingredient list. Shopping  Buy products labeled as "low-sodium" or "no salt added."  Buy fresh foods. Avoid canned foods and premade or frozen meals. Cooking  Avoid adding salt when cooking. Use salt-free seasonings or herbs instead of table salt or sea salt. Check with  your health care provider or pharmacist before using salt substitutes.  Do not fry foods. Cook foods using healthy methods such as baking, boiling, grilling, and broiling instead.  Cook with heart-healthy oils, such as olive, canola, soybean, or sunflower oil. Meal planning  Eat a balanced diet that includes: ? 5 or more servings of fruits and vegetables each day. At each meal, try to fill half of your plate with fruits and vegetables. ? Up to 6-8 servings of whole grains each day. ? Less than 6 oz of lean meat, poultry, or fish each day. A 3-oz serving of meat is about the same size as a deck of cards. One egg equals 1 oz. ? 2 servings of low-fat dairy each day. ? A serving of nuts, seeds, or beans 5 times each week. ? Heart-healthy fats. Healthy fats called Omega-3 fatty acids are found in foods such as flaxseeds and coldwater fish, like sardines, salmon, and mackerel.  Limit how much you eat of the following: ? Canned or prepackaged foods. ? Food that is high in trans fat, such as fried foods. ? Food that is high in saturated fat, such as fatty meat. ? Sweets, desserts, sugary drinks, and other foods with added sugar. ? Full-fat dairy products.  Do not salt foods before eating.  Try to eat at least 2 vegetarian meals each week.  Eat more home-cooked food and less restaurant, buffet, and fast food.  When eating at a restaurant, ask that your food be prepared with less salt or no salt, if possible. What foods are recommended? The items listed may not be a complete list. Talk with your dietitian about what dietary choices are best for you. Grains Whole-grain or whole-wheat bread. Whole-grain or whole-wheat pasta. Brown rice. [place]. Bulgur. Whole-grain and low-sodium cereals. Pita bread. Low-fat, low-sodium crackers. Whole-wheat flour tortillas. Vegetables Fresh or frozen vegetables (raw, steamed, roasted, or grilled). Low-sodium or reduced-sodium tomato and vegetable  juice. Low-sodium or reduced-sodium tomato sauce and tomato paste. Low-sodium or reduced-sodium canned vegetables. Fruits All fresh, dried, or frozen fruit. Canned fruit in natural juice (without added sugar). Meat and other protein foods Skinless chicken or [place]. Ground chicken or [place]. Pork with fat trimmed off. Fish and seafood. Egg whites. Dried beans, peas, or lentils. Unsalted nuts, nut butters, and seeds. Unsalted canned beans. Lean cuts of beef with fat trimmed off. Low-sodium, lean deli meat. Dairy Low-fat (1%) or fat-free (skim) milk. Fat-free, low-fat, or reduced-fat cheeses. Nonfat, low-sodium ricotta or cottage cheese. Low-fat or nonfat yogurt. Low-fat, low-sodium cheese. Fats and oils Soft margarine without trans fats. Vegetable oil. Low-fat, reduced-fat, or light mayonnaise and salad dressings (reduced-sodium). Canola, safflower, olive, soybean, and sunflower oils. Avocado. Seasoning and other foods Herbs. Spices. Seasoning mixes without salt. Unsalted popcorn and pretzels. Fat-free sweets. What foods are not recommended? The items listed may not be a complete list. Talk with your dietitian about what dietary choices are best for you. Grains Baked goods made with fat, such as croissants, muffins, or some breads. Dry pasta or rice meal packs. Vegetables Creamed or fried vegetables. Vegetables in a cheese sauce. Regular canned vegetables (not low-sodium or reduced-sodium). Regular canned tomato sauce and paste (not low-sodium or reduced-sodium). Regular tomato and vegetable juice (not low-sodium or reduced-sodium). Angie Fava. Olives. Fruits Canned fruit in a light or heavy syrup. Fried fruit. Fruit in cream or butter sauce. Meat and other protein foods Fatty cuts of meat. Ribs. Fried meat. Berniece Salines. Sausage. Bologna and other processed lunch meats. Salami. Fatback. Hotdogs. Bratwurst. Salted nuts and seeds. Canned beans with added salt. Canned or smoked fish. Whole eggs or egg yolks.  Chicken or [place] with skin. Dairy Whole or 2% milk, cream, and half-and-half. Whole or full-fat cream cheese. Whole-fat or sweetened yogurt. Full-fat cheese. Nondairy creamers. Whipped toppings. Processed cheese and cheese spreads. Fats and oils Butter. Stick margarine. Lard. Shortening. Ghee. Bacon fat. Tropical oils, such as coconut, palm kernel, or palm oil. Seasoning and other foods Salted popcorn and pretzels. Onion salt, garlic salt, seasoned salt, table salt, and sea salt. Worcestershire sauce. Tartar sauce. Barbecue sauce. Teriyaki sauce. Soy sauce, including reduced-sodium. Steak sauce. Canned and packaged gravies. Fish sauce. Oyster sauce. Cocktail sauce. Horseradish that you find on the shelf. Ketchup. Mustard. Meat flavorings and tenderizers. Bouillon cubes. Hot sauce and Tabasco sauce. Premade or packaged marinades. Premade or packaged taco seasonings. Relishes. Regular salad dressings. Where to find more information:  National Heart, Lung, and [place]: https://wilson-eaton.com/  American Heart Association: www.heart.org Summary  The DASH eating plan is a healthy eating plan that has been shown to reduce high blood pressure (hypertension). It may also reduce your risk for type 2 diabetes, heart disease, and stroke.  With the DASH eating plan, you should limit salt (sodium) intake to 2,300 mg a day. If you have hypertension, you may need to reduce your sodium intake to 1,500 mg a day.  When on the DASH eating plan,  aim to eat more fresh fruits and vegetables, whole grains, lean proteins, low-fat dairy, and heart-healthy fats.  Work with your health care provider or diet and nutrition specialist (dietitian) to adjust your eating plan to your individual calorie needs. This information is not intended to replace advice given to you by your health care provider. Make sure you discuss any questions you have with your health care provider. Document Released: 06/29/2011 Document Revised:  06/22/2017 Document Reviewed: 07/03/2016 Elsevier Patient Education  2020 Else[employer]. Hydrochlorothiazide, HCTZ capsules or tablets What is this medicine? HYDROCHLOROTHIAZIDE (hye droe klor oh THYE a zide) is a diuretic. It increases the amount of urine passed, which causes the body to lose salt and water. This medicine is used to treat high blood pressure. It is also reduces the swelling and water retention caused by various medical conditions, such as heart, liver, or kidney disease. This medicine may be used for other purposes; ask your health care provider or pharmacist if you have questions. COMMON BRAND NAME(S): Esidrix, Ezide, HydroDIURIL, Microzide, Oretic, Zide What should I tell my health care provider before I take this medicine? They need to know if you have any of these conditions:  diabetes  gout  immune system problems, like lupus  kidney disease or kidney stones  liver disease  pancreatitis  small amount of urine or difficulty passing urine  an unusual or allergic reaction to hydrochlorothiazide, sulfa drugs, other medicines, foods, dyes, or preservatives  pregnant or trying to get pregnant  breast-feeding How should I use this medicine? Take this medicine by mouth with a glass of water. Follow the directions on the prescription label. Take your medicine at regular intervals. Remember that you will need to pass urine frequently after taking this medicine. Do not take your doses at a time of day that will cause you problems. Do not stop taking your medicine unless your doctor tells you to. Talk to your pediatrician regarding the use of this medicine in children. Special care may be needed. Overdosage: If you think you have taken too much of this medicine contact a poison control center or emergency room at once. NOTE: This medicine is only for you. Do not share this medicine with others. What if I miss a dose? If you miss a dose, take it as soon as you can. If it  is almost time for your next dose, take only that dose. Do not take double or extra doses. What may interact with this medicine?  cholestyramine  colestipol  digoxin  dofetilide  lithium  medicines for blood pressure  medicines for diabetes  medicines that relax muscles for surgery  other diuretics  steroid medicines like prednisone or cortisone This list may not describe all possible interactions. Give your health care provider a list of all the medicines, herbs, non-prescription drugs, or dietary supplements you use. Also tell them if you smoke, drink alcohol, or use illegal drugs. Some items may interact with your medicine. What should I watch for while using this medicine? Visit your doctor or health care professional for regular checks on your progress. Check your blood pressure as directed. Ask your doctor or health care professional what your blood pressure should be and when you should contact him or her. You may need to be on a special diet while taking this medicine. Ask your doctor. Check with your doctor or health care professional if you get an attack of severe diarrhea, nausea and vomiting, or if you sweat a lot. The loss  of too much body fluid can make it dangerous for you to take this medicine. You may get drowsy or dizzy. Do not drive, use machinery, or do anything that needs mental alertness until you know how this medicine affects you. Do not stand or sit up quickly, especially if you are an older patient. This reduces the risk of dizzy or fainting spells. Alcohol may interfere with the effect of this medicine. Avoid alcoholic drinks. This medicine may increase blood sugar. Ask your healthcare provider if changes in diet or medicines are needed if you have diabetes. This medicine can make you more sensitive to the sun. Keep out of the sun. If you cannot avoid being in the sun, wear protective clothing and use sunscreen. Do not use sun lamps or tanning beds/booths.  What side effects may I notice from receiving this medicine? Side effects that you should report to your doctor or health care professional as soon as possible:  allergic reactions such as skin rash or itching, hives, swelling of the lips, mouth, tongue, or throat  changes in vision  chest pain  eye pain  fast or irregular heartbeat  feeling faint or lightheaded, falls  gout attack  muscle pain or cramps  pain or difficulty when passing urine  pain, tingling, numbness in the hands or feet  redness, blistering, peeling or loosening of the skin, including inside the mouth   signs and symptoms of high blood sugar such as being more thirsty or hungry or having to urinate more than normal. You may also feel very tired or have blurry vision.  unusually weak Side effects that usually do not require medical attention (report to your doctor or health care professional if they continue or are bothersome):  change in sex drive or performance  dry mouth  headache  stomach upset This list may not describe all possible side effects. Call your doctor for medical advice about side effects. You may report side effects to FDA at 1-800-FDA-1088. Where should I keep my medicine? Keep out of the reach of children. Store at room temperature between 15 and 30 degrees C (59 and 86 degrees F). Do not freeze. Protect from light and moisture. Keep container closed tightly. Throw away any unused medicine after the expiration date. NOTE: This sheet is a summary. It may not cover all possible information. If you have questions about this medicine, talk to your doctor, pharmacist, or health care provider.  2020 Elsevier/Gold Standard (2018-05-01 09:25:06) Health Maintenance, Female Adopting a healthy lifestyle and getting preventive care are important in promoting health and wellness. Ask your health care provider about:  The right schedule for you to have regular tests and exams.  Things you can  do on your own to prevent diseases and keep yourself healthy. What should I know about diet, weight, and exercise? Eat a healthy diet   Eat a diet that includes plenty of vegetables, fruits, low-fat dairy products, and lean protein.  Do not eat a lot of foods that are high in solid fats, added sugars, or sodium. Maintain a healthy weight Body mass index (BMI) is used to identify weight problems. It estimates body fat based on height and weight. Your health care provider can help determine your BMI and help you achieve or maintain a healthy weight. Get regular exercise Get regular exercise. This is one of the most important things you can do for your health. Most adults should:  Exercise for at least 150 minutes each week. The exercise should  increase your heart rate and make you sweat (moderate-intensity exercise).  Do strengthening exercises at least twice a week. This is in addition to the moderate-intensity exercise.  Spend less time sitting. Even light physical activity can be beneficial. Watch cholesterol and blood lipids Have your blood tested for lipids and cholesterol at 31 years of age, then have this test every 5 years. Have your cholesterol levels checked more often if:  Your lipid or cholesterol levels are high.  You are older than 31 years of age.  You are at high risk for heart disease. What should I know about cancer screening? Depending on your health history and family history, you may need to have cancer screening at various ages. This may include screening for:  Breast cancer.  Cervical cancer.  Colorectal cancer.  Skin cancer.  Lung cancer. What should I know about heart disease, diabetes, and high blood pressure? Blood pressure and heart disease  High blood pressure causes heart disease and increases the risk of stroke. This is more likely to develop in people who have high blood pressure readings, are of African descent, or are overweight.  Have your  blood pressure checked: ? Every 3-5 years if you are 6218-31 years of age. ? Every year if you are 722 years old or older. Diabetes Have regular diabetes screenings. This checks your fasting blood sugar level. Have the screening done:  Once every three years after age 31 if you are at a normal weight and have a low risk for diabetes.  More often and at a younger age if you are overweight or have a high risk for diabetes. What should I know about preventing infection? Hepatitis B If you have a higher risk for hepatitis B, you should be screened for this virus. Talk with your health care provider to find out if you are at risk for hepatitis B infection. Hepatitis C Testing is recommended for:  Everyone born from [birth year] through 1963.  Anyone with known risk factors for hepatitis C. Sexually transmitted infections (STIs)  Get screened for STIs, including gonorrhea and chlamydia, if: ? You are sexually active and are younger than 31 years of age. ? You are older than 31 years of age and your health care provider tells you that you are at risk for this type of infection. ? Your sexual activity has changed since you were last screened, and you are at increased risk for chlamydia or gonorrhea. Ask your health care provider if you are at risk.  Ask your health care provider about whether you are at high risk for HIV. Your health care provider may recommend a prescription medicine to help prevent HIV infection. If you choose to take medicine to prevent HIV, you should first get tested for HIV. You should then be tested every 3 months for as long as you are taking the medicine. Pregnancy  If you are about to stop having your period (premenopausal) and you may become pregnant, seek counseling before you get pregnant.  Take 400 to 800 micrograms (mcg) of folic acid every day if you become pregnant.  Ask for birth control (contraception) if you want to prevent pregnancy. Osteoporosis and menopause  Osteoporosis is a disease in which the bones lose minerals and strength with aging. This can result in bone fractures. If you are 819 years old or older, or if you are at risk for osteoporosis and fractures, ask your health care provider if you should:  Be screened for bone loss.  Take  a calcium or vitamin D supplement to lower your risk of fractures.  Be given hormone replacement therapy (HRT) to treat symptoms of menopause. Follow these instructions at home: Lifestyle  Do not use any products that contain nicotine or tobacco, such as cigarettes, e-cigarettes, and chewing tobacco. If you need help quitting, ask your health care provider.  Do not use street drugs.  Do not share needles.  Ask your health care provider for help if you need support or information about quitting drugs. Alcohol use  Do not drink alcohol if: ? Your health care provider tells you not to drink. ? You are pregnant, may be pregnant, or are planning to become pregnant.  If you drink alcohol: ? Limit how much you use to 0-1 drink a day. ? Limit intake if you are breastfeeding.  Be aware of how much alcohol is in your drink. In the [place] one drink equals one 12 oz bottle of beer (355 mL), one 5 oz glass of wine (148 mL), or one 1 oz glass of hard liquor (44 mL). General instructions  Schedule regular health, dental, and eye exams.  Stay current with your vaccines.  Tell your health care provider if: ? You often feel depressed. ? You have ever been abused or do not feel safe at home. Summary  Adopting a healthy lifestyle and getting preventive care are important in promoting health and wellness.  Follow your health care provider's instructions about healthy diet, exercising, and getting tested or screened for diseases.  Follow your health care provider's instructions on monitoring your cholesterol and blood pressure. This information is not intended to replace advice given to you by your health care  provider. Make sure you discuss any questions you have with your health care provider. Document Released: 01/23/2011 Document Revised: 07/03/2018 Document Reviewed: 07/03/2018 Elsevier Patient Education  2020 [employer].

## 2019-02-17 NOTE — Progress Notes (Signed)
Patient [facility]  New Patient Encounter Provider: Lanae Boast, [place]  JJO:[id number]  DOB - 27-Mar-1986  SUBJECTIVE:   Lindsay Schneider, is a 33 y.o. female who presents to establish care with this clinic.   Current problems/concerns:  Patient presents to establish care. Has a history of MS that was diagnosed in July 2011. Patient is followed by Dr. Felecia Shelling in neurology.  She is in need of a home health evaluation. She states that she has difficulty with shifting her body weight while in bed. She is also wheelchair bound and needs evaluation for need of correct padding and supplies. Patient states that she is having left sided weakness in the upper and lower extremities due to her MS.  She was to follow up with physical therapy, but was placed on hold due to COVID restrictions.  Patient must use a wheelchair and aide when leaving the home.    No Known Allergies Past Medical History:  Diagnosis Date  . Movement disorder   . MS (multiple sclerosis) ([place])    Current Outpatient Medications on File Prior to Visit  Medication Sig Dispense Refill  . baclofen (LIORESAL) 10 MG tablet Take one or two pills 3 times a day (up to 6/day) 180 tablet 11  . dalfampridine 10 MG TB12 TAKE ONE TABLET BY MOUTH TWICE DAILY (APPROXIMATELY 12 HOURS APART). MAY BE TAKEN WITH OR WITHOUT FOOD. DO NOT CUT OR CRUSH. STORE AT ROOM TEMPERATURE 180 tablet 1  . hydrOXYzine (ATARAX/VISTARIL) 25 MG tablet Take 25 mg by mouth at bedtime.   0  . natalizumab (TYSABRI) 300 MG/15ML injection Inject 300 mg into the vein every 30 (thirty) days.    Marland Kitchen oxybutynin (DITROPAN) 5 MG tablet Take 1 tablet (5 mg total) by mouth 2 (two) times daily. 60 tablet 11  . phentermine (ADIPEX-P) 37.5 MG tablet TAKE 1 TAB BY MOUTH EVERY DAY IN THE MORNING.  Please keep pending appt on 10/23/2018. 30 tablet 5  . triamcinolone (KENALOG) 0.025 % ointment Apply 1 application topically 2 (two)  times daily as needed (rash).   0  . Vitamin D, Ergocalciferol, (DRISDOL) 1.25 MG (50000 UT) CAPS capsule Take 1 capsule (50,000 Units total) by mouth every 7 (seven) days. 13 capsule 3  . LORazepam (ATIVAN) 1 MG tablet One po qHS prn (Patient not taking: Reported on 02/17/2019) 30 tablet 5   No current facility-administered medications on file prior to visit.    Family History  Problem Relation Age of Onset  . Asthma Mother   . Hypertension Father   . Healthy Sister   . Healthy Brother    Social History   Socioeconomic History  . Marital status: Single    Spouse name: Not on file  . Number of children: Not on file  . Years of education: Not on file  . Highest education level: Not on file  Occupational History  . Not on file  Social Needs  . Financial resource strain: Not on file  . Food insecurity    Worry: Not on file    Inability: Not on file  . Transportation needs    Medical: Not on file    Non-medical: Not on file  Tobacco Use  . Smoking status: Never Smoker  . Smokeless tobacco: Never Used  Substance and Sexual Activity  . Alcohol use: No  . Drug use: No  . Sexual activity: Not Currently    Birth control/protection: Injection  Lifestyle  .  Physical activity    Days per week: Not on file    Minutes per session: Not on file  . Stress: Not on file  Relationships  . Social Musician on phone: Not on file    Gets together: Not on file    Attends religious service: Not on file    Active member of club or organization: Not on file    Attends meetings of clubs or organizations: Not on file    Relationship status: Not on file  . Intimate partner violence    Fear of current or ex partner: Not on file    Emotionally abused: Not on file    Physically abused: Not on file    Forced sexual activity: Not on file  Other Topics Concern  . Not on file  Social History Narrative  . Not on file    ROS   OBJECTIVE:    BP 140/88 Comment: manually  Pulse  88   Temp 98.5 F (36.9 C) (Oral)   Resp 14   LMP 01/31/2019   SpO2 98%   Physical Exam  Constitutional: She is oriented to person, place, and time and well-developed, well-nourished, and in no distress. No distress.  HENT:  Head: Normocephalic and atraumatic.  Eyes: Pupils are equal, round, and reactive to light. Conjunctivae and EOM are normal.  Neck: Normal range of motion. Neck supple.  Cardiovascular: Normal rate, regular rhythm and intact distal pulses. Exam reveals no gallop and no friction rub.  No murmur heard. Pulmonary/Chest: Effort normal and breath sounds normal. No respiratory distress. She has no wheezes.  Abdominal: Soft. Bowel sounds are normal. There is no abdominal tenderness.  Musculoskeletal: Normal range of motion.        General: No tenderness or edema.  Lymphadenopathy:    She has no cervical adenopathy.  Neurological: She is alert and oriented to person, place, and time. She displays weakness and abnormal stance (unable to stand). She exhibits abnormal muscle tone. Coordination and gait abnormal.  In wheelchair   Skin: Skin is warm and dry.  Psychiatric: Mood, memory, affect and judgment normal.  Nursing note and vitals reviewed.    ASSESSMENT/PLAN:  1. Multiple sclerosis (HCC) Patient followed by neurology. She will continue with current medications.  - Ambulatory referral to Home Health  2. 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  - Ambulatory referral to Home Health  3. Gait disturbance Referred for home health evaluation.   4. Hypertension, unspecified type Will add HCTZ due to bilateral lower edema and increased HTN today.  - hydrochlorothiazide (HYDRODIURIL) 12.5 MG tablet; Take 1 tablet (12.5 mg total) by mouth daily.  Dispense: 90 tablet; Refill: 3  Return in about 3 months (around 05/20/2019) for HTN.  The patient was given clear instructions to go to  ER or return to medical center if symptoms don't improve, worsen or new problems develop. The patient verbalized understanding. The patient was told to call to get lab results if they haven't heard anything in the next week.     This note has been created with Education officer, environmental. Any transcriptional errors are unintentional.   Ms. Andr L. Riley Lam, FNP-BC [facility] [address] [place], [place] [address] [phone]

## 2019-03-10 ENCOUNTER — Telehealth: Payer: Self-pay | Admitting: Neurology

## 2019-03-10 NOTE — Telephone Encounter (Signed)
I called patient regarding rescheduling her 8/28 appointment due to office being closed. Requested patient call back to reschedule.

## 2019-03-12 ENCOUNTER — Encounter: Payer: Self-pay | Admitting: Neurology

## 2019-03-12 NOTE — Telephone Encounter (Signed)
Unable to contact patient, appt has been cancelled, letter has been sent to advise pt of cancellation.

## 2019-03-21 ENCOUNTER — Ambulatory Visit: Payer: Medicare Other | Admitting: Neurology

## 2019-04-07 ENCOUNTER — Telehealth: Payer: Self-pay

## 2019-04-07 NOTE — Telephone Encounter (Signed)
We received a letter stating the patient had a telephone visit with the health department so Lindsay Schneider wanted me to call pt and see if she is still coming here as a patient she stated she was. So I have scheduled her an appointment for a physical. YRL,RMA

## 2019-04-10 ENCOUNTER — Encounter (HOSPITAL_COMMUNITY): Payer: Medicare Other

## 2019-04-16 ENCOUNTER — Other Ambulatory Visit: Payer: Self-pay | Admitting: Neurology

## 2019-04-21 ENCOUNTER — Encounter (HOSPITAL_COMMUNITY): Payer: Medicare Other

## 2019-04-25 ENCOUNTER — Ambulatory Visit (HOSPITAL_COMMUNITY)
Admission: RE | Admit: 2019-04-25 | Discharge: 2019-04-25 | Disposition: A | Discharge status: Home / Self Care | Payer: Medicare Other | Source: Ambulatory Visit | Attending: Neurology | Admitting: Neurology

## 2019-04-25 ENCOUNTER — Other Ambulatory Visit: Payer: Self-pay

## 2019-04-25 DIAGNOSIS — G35 Multiple sclerosis: Secondary | ICD-10-CM | POA: Insufficient documentation

## 2019-04-25 MED ORDER — SODIUM CHLORIDE 0.9 % IV SOLN
INTRAVENOUS | Status: DC | PRN
  Administered 2019-04-25: 250 mL via INTRAVENOUS

## 2019-04-25 MED ORDER — SODIUM CHLORIDE 0.9 % IV SOLN
300.0000 mg | INTRAVENOUS | Status: DC
  Administered 2019-04-25: 300 mg via INTRAVENOUS
  Filled 2019-04-25: qty 15

## 2019-04-25 NOTE — Progress Notes (Signed)
PATIENT CARE CENTER NOTE  Diagnosis:Multiple Sclerosis   Provider:Richard, Sater, MD   Procedure:Tysabri IV   Note:Patient received Tysabri infusion via PIV. Tolerated well with no adverse reaction. Patient did not want to stay the 1 hour post-infusion. Discharge instructions given. Vital signs stable. Alert, oriented and discharged in personal wheelchair.

## 2019-04-25 NOTE — Discharge Instructions (Signed)
Natalizumab injection What is this medicine? NATALIZUMAB (na ta LIZ you mab) is used to treat relapsing multiple sclerosis. This drug is not a cure. It is also used to treat Crohn's disease. This medicine may be used for other purposes; ask your health care provider or pharmacist if you have questions. COMMON BRAND NAME(S): Tysabri What should I tell my health care provider before I take this medicine? They need to know if you have any of these conditions:  immune system problems  progressive multifocal leukoencephalopathy (PML)  an unusual or allergic reaction to natalizumab, other medicines, foods, dyes, or preservatives  pregnant or trying to get pregnant  breast-feeding How should I use this medicine? This medicine is for infusion into a vein. It is given by a health care professional in a hospital or clinic setting. A special MedGuide will be given to you by the pharmacist with each prescription and refill. Be sure to read this information carefully each time. Talk to your pediatrician regarding the use of this medicine in children. This medicine is not approved for use in children. Overdosage: If you think you have taken too much of this medicine contact a poison control center or emergency room at once. NOTE: This medicine is only for you. Do not share this medicine with others. What if I miss a dose? It is important not to miss your dose. Call your doctor or health care professional if you are unable to keep an appointment. What may interact with this medicine?  azathioprine  cyclosporine  interferon  6-mercaptopurine  methotrexate  steroid medicines like prednisone or cortisone  TNF-alpha inhibitors like adalimumab, etanercept, and infliximab  vaccines This list may not describe all possible interactions. Give your health care provider a list of all the medicines, herbs, non-prescription drugs, or dietary supplements you use. Also tell them if you smoke, drink  alcohol, or use illegal drugs. Some items may interact with your medicine. What should I watch for while using this medicine? Your condition will be monitored carefully while you are receiving this medicine. Visit your doctor for regular check ups. Tell your doctor or healthcare professional if your symptoms do not start to get better or if they get worse. Stay away from people who are sick. Call your doctor or health care professional for advice if you get a fever, chills or sore throat, or other symptoms of a cold or flu. Do not treat yourself. In some patients, this medicine may cause a serious brain infection that may cause death. If you have any problems seeing, thinking, speaking, walking, or standing, tell your doctor right away. If you cannot reach your doctor, get urgent medical care. What side effects may I notice from receiving this medicine? Side effects that you should report to your doctor or health care professional as soon as possible:  allergic reactions like skin rash, itching or hives, swelling of the face, lips, or tongue  breathing problems  changes in vision  chest pain  dark urine  depression, feelings of sadness  dizziness  general ill feeling or flu-like symptoms  irregular, missed, or painful menstrual periods  light-colored stools  loss of appetite, nausea  muscle weakness  problems with balance, talking, or walking  right upper belly pain  unusually weak or tired  yellowing of the eyes or skin Side effects that usually do not require medical attention (report to your doctor or health care professional if they continue or are bothersome):  aches, pains  headache  stomach upset    tiredness This list may not describe all possible side effects. Call your doctor for medical advice about side effects. You may report side effects to FDA at 1-800-FDA-1088. Where should I keep my medicine? This drug is given in a hospital or clinic and will not be  stored at home. NOTE: This sheet is a summary. It may not cover all possible information. If you have questions about this medicine, talk to your doctor, pharmacist, or health care provider.  2020 Elsevier/Gold Standard (2008-08-29 13:33:21)  

## 2019-04-30 ENCOUNTER — Ambulatory Visit: Payer: Medicare Other | Admitting: Neurology

## 2019-05-06 ENCOUNTER — Encounter: Payer: Self-pay | Admitting: Nurse Practitioner

## 2019-05-07 ENCOUNTER — Telehealth: Payer: Self-pay | Admitting: *Deleted

## 2019-05-07 NOTE — Telephone Encounter (Signed)
Called and spoke with pt. Advised we are aware she had to cx appt that was for 04/30/19 with Dr. Felecia Shelling d/t transportation issues and r/s for 07/03/19. However, I talked with Dr. Felecia Shelling and he would like her to come in sooner for appt. I scheduled sooner appt with her on 05/21/19 at 10:30am with AL,NP. Cx appt made for 07/03/19 since she is coming in sooner. Advised we need updated OV/labs re-authorize her Tysabri infusions that she receives at [facility] (phone: [phone]). Her last infusion with them was 04/25/2019. We will need to send them new orders after she is seen that day. She verbalized understanding.

## 2019-05-15 ENCOUNTER — Other Ambulatory Visit: Payer: Self-pay | Admitting: Neurology

## 2019-05-20 ENCOUNTER — Ambulatory Visit: Payer: Medicare Other | Admitting: Family Medicine

## 2019-05-21 ENCOUNTER — Encounter: Payer: Self-pay | Admitting: Family Medicine

## 2019-05-21 ENCOUNTER — Other Ambulatory Visit: Payer: Self-pay

## 2019-05-21 ENCOUNTER — Ambulatory Visit (INDEPENDENT_AMBULATORY_CARE_PROVIDER_SITE_OTHER): Payer: Medicare Other | Admitting: Family Medicine

## 2019-05-21 ENCOUNTER — Telehealth: Payer: Self-pay | Admitting: Family Medicine

## 2019-05-21 ENCOUNTER — Ambulatory Visit: Payer: Medicare Other | Admitting: Family Medicine

## 2019-05-21 VITALS — BP 133/90 | HR 79 | Temp 97.7°F | Ht 62.0 in

## 2019-05-21 DIAGNOSIS — R269 Unspecified abnormalities of gait and mobility: Secondary | ICD-10-CM

## 2019-05-21 DIAGNOSIS — R29898 Other symptoms and signs involving the musculoskeletal system: Secondary | ICD-10-CM | POA: Diagnosis not present

## 2019-05-21 DIAGNOSIS — G8114 Spastic hemiplegia affecting left nondominant side: Secondary | ICD-10-CM | POA: Diagnosis not present

## 2019-05-21 DIAGNOSIS — G35 Multiple sclerosis: Secondary | ICD-10-CM | POA: Diagnosis not present

## 2019-05-21 DIAGNOSIS — E559 Vitamin D deficiency, unspecified: Secondary | ICD-10-CM | POA: Diagnosis not present

## 2019-05-21 NOTE — Telephone Encounter (Signed)
Medicare/medicaid order sent to GI. No auth they will reach out to the patient to schedule.  °

## 2019-05-21 NOTE — Patient Instructions (Signed)
We will update labs today  PT referral for hemiplegia and weakness  Continue current treatment plan. Please call to schedule Tysabri infusion  Follow up with Dr Epimenio Foot in 4-6 months.   Multiple Sclerosis Multiple sclerosis (MS) is a disease of the brain, spinal cord, and optic nerves (central nervous system). It causes the body's disease-fighting (immune) system to destroy the protective covering (myelin sheath) around nerves in the brain. When this happens, signals (nerve impulses) going to and from the brain and spinal cord do not get sent properly or may not get sent at all. There are several types of MS:  Relapsing-remitting MS. This is the most common type. This causes sudden attacks of symptoms. After an attack, you may recover completely until the next attack, or some symptoms may remain permanently.  Secondary progressive MS. This usually develops after the onset of relapsing-remitting MS. Similar to relapsing-remitting MS, this type also causes sudden attacks of symptoms. Attacks may be less frequent, but symptoms slowly get worse (progress) over time.  Primary progressive MS. This causes symptoms that steadily progress over time. This type of MS does not cause sudden attacks of symptoms. The age of onset of MS varies, but it often develops between 39-40 years of age. MS is a lifelong (chronic) condition. There is no cure, but treatment can help slow down the progression of the disease. What are the causes? The cause of this condition is not known. What increases the risk? You are more likely to develop this condition if:  You are a woman.  You have a relative with MS. However, the condition is not passed from parent to child (inherited).  You have a lack (deficiency) of vitamin D.  You smoke. MS is more common in the [place] than in the [place]. What are the signs or symptoms? Relapsing-remitting and secondary progressive MS cause symptoms to  occur in episodes or attacks that may last weeks to months. There may be long periods between attacks in which there are almost no symptoms. Primary progressive MS causes symptoms to steadily progress after they develop. Symptoms of MS vary because of the many different ways it affects the central nervous system. The main symptoms include:  Vision problems and eye pain.  Numbness.  Weakness.  Inability to move your arms, hands, feet, or legs (paralysis).  Balance problems.  Shaking that you cannot control (tremors).  Muscle spasms.  Problems with thinking (cognitive changes). MS can also cause symptoms that are associated with the disease, but are not always the direct result of an MS attack. They may include:  Inability to control urination or bowel movements (incontinence).  Headaches.  Fatigue.  Inability to tolerate heat.  Emotional changes.  Depression.  Pain. How is this diagnosed? This condition is diagnosed based on:  Your symptoms.  A neurological exam. This involves checking central nervous system function, such as nerve function, reflexes, and coordination.  MRIs of the brain and spinal cord.  Lab tests, including a lumbar puncture that tests the fluid that surrounds the brain and spinal cord (cerebrospinal fluid).  Tests to measure the electrical activity of the brain in response to stimulation (evoked potentials). How is this treated? There is no cure for MS, but medicines can help decrease the number and frequency of attacks and help relieve nuisance symptoms. Treatment options may include:  Medicines that reduce the frequency of attacks. These medicines may be given by injection, by mouth (orally), or through an IV.  Medicines  that reduce inflammation (steroids). These may provide short-term relief of symptoms.  Medicines to help control pain, depression, fatigue, or incontinence.  Vitamin D, if you have a deficiency.  Using devices to help you  move around (assistive devices), such as braces, a cane, or a walker.  Physical therapy to strengthen and stretch your muscles.  Occupational therapy to help you with everyday tasks.  Alternative or complementary treatments such as exercise, massage, or acupuncture. Follow these instructions at home:  Take over-the-counter and prescription medicines only as told by your health care provider.  Do not drive or use heavy machinery while taking prescription pain medicine.  Use assistive devices as recommended by your physical therapist or your health care provider.  Exercise as directed by your health care provider.  Return to your normal activities as told by your health care provider. Ask your health care provider what activities are safe for you.  Reach out for support. Share your feelings with friends, family, or a support group.  Keep all follow-up visits as told by your health care provider and therapists. This is important. Where to find more information  National Multiple Sclerosis Society: https://www.nationalmssociety.org Contact a health care provider if:  You feel depressed.  You develop new pain or numbness.  You have tremors.  You have problems with sexual function. Get help right away if:  You develop paralysis.  You develop numbness.  You have problems with your bladder or bowel function.  You develop double vision.  You lose vision in one or both eyes.  You develop suicidal thoughts.  You develop severe confusion. If you ever feel like you may hurt yourself or others, or have thoughts about taking your own life, get help right away. You can go to your nearest emergency department or call:  Your local emergency services (911 in the [place]  A suicide crisis helpline, such as the [place] at 574-167-6098. This is open 24 hours a day. Summary  Multiple sclerosis (MS) is a disease of the central nervous system that causes  the body's immune system to destroy the protective covering (myelin sheath) around nerves in the brain.  There are 3 types of MS: relapsing-remitting, secondary progressive, and primary progressive. Relapsing-remitting and secondary progressive MS cause symptoms to occur in episodes or attacks that may last weeks to months. Primary progressive MS causes symptoms to steadily progress after they develop.  There is no cure for MS, but medicines can help decrease the number and frequency of attacks and help relieve nuisance symptoms. Treatment may also include physical or occupational therapy.  If you develop numbness, paralysis, vision problems, or other neurological symptoms, get help right away. This information is not intended to replace advice given to you by your health care provider. Make sure you discuss any questions you have with your health care provider. Document Released: 07/07/2000 Document Revised: 06/22/2017 Document Reviewed: 09/18/2016 Elsevier Patient Education  2020 [employer].

## 2019-05-21 NOTE — Progress Notes (Signed)
PATIENT: Lindsay Schneider DOB: [birth year]  REASON FOR VISIT: follow up HISTORY FROM: patient  Chief Complaint  Patient presents with  . Follow-up    MS f/u. Alone. Rm 5. Patient mentioned that she cant move her left arm or left leg.      HISTORY OF PRESENT ILLNESS: Today 05/21/19 Lindsay Schneider is a 33 y.o. female here today for follow up. Last Tysabri infusion was in September, 2020. She is tolerating Tysabri well with no obvious adverse effects.  She reports that MS symptoms are stable.  She continues to have left-sided weakness.  She is unable to use her left side.  She continues baclofen as prescribed for spasticity as well as Ampyra for stiffness.  She feels that these medications help somewhat.  She also continues Ditropan for overactive bladder.  No incontinence.  She denies any new concerns of numbness, new weakness, vision changes.  She is wheelchair-bound.  She requires assistance with transfers.  She has an aide that comes to her home every morning and another that comes every evening.  She completed prescription for vitamin D.  We will update labs today.  Last MRI 2016.  HISTORY: (copied from Dr Bonnita Hollow note on 09/20/2018)  Lindsay Schneider is a 33 y.o. woman who was diagnosed with MS in 2011 after presenting with left sided arm and leg numbness and weakness.     Update 09/20/2018: Her MS has been stable.   She is on Tysabri and tolerates it well.  She is JCV negative (0.27; neg inhib assay).     No exacerbations.    She is wheelchair bound and needs help with transfers.     Her right arm is strong but she has bilateral leg and left arm weakness and spasticity.      Vision is doing well.    She denies any problems with color vision.      No vertigo.     She has bladder urgency and frequency.    She has nocturia.    She feels fatigue is better on phentermine.   She tolerates it well.   Focus and attention are better on Adderall.   She is back in school.      She has some  sleep onset insomnia but sleep maintenance is reasonably good.        Mood is doing well.    She is seeing a dermatologist for a rash and is on Kenalog cream.     Update 08/28/2017: She has no new MS exacerbations and impairments are stable.  She tolerates Tysabri well.  She actually has done slightly better this year.   She can dress independently now and was requiring help to get pants on.   She has severe leg weakness, moderate left arm weakness, all with spasticity.   Her right arm is strong      She has a rash on her arms progressing over the past couple weeks.   She has dark roundish spots from head to legs.   She had a biopsy at [facility] yesterday.    No treatments were provided yet.     From 12/27/2016: MS:   She is on Tysabri. Her JCV Ab titers have been negative.   She tolerates Tysabri well.  She has had no definite exacerbation.       She has been on Tysabri for about 18 months but had been on it previously as well.  Gait/strength/sensation: She has had more weakness  in her legs, left worse than right.   Her left arm is weak but right arm is strong.    She is whheelchair bound and has needed more help with transfers.    She also has left side spasticity.   Handwriting is mildly better (right hand) but she tires out and her hand shakes when that occurs.     She takes baclofen 90 mg a day and tizanidine only once a day .   Higher dose was not well tolerated    Sometimes, she hgets severe right leg spasms where the leg locks up.   This mostly occurs when she is being moved.     Bladder:   She has difficulty with her bladder. She has urinary urgency and frequency and will have occasional incontinence if she cannot get to the bathroom and transfer in time.  Oxybutynin only helpe a little bit so she stopped.     Vision:   She denies any difficulty with her vision.  Vision is blurry in the mornings.   .    Fatigue:   She has physical fatigue.   Her fatigue improved when phentermine  was started.   She also has been able to lose a couple pounds.  She is sleeping ok most nights on tizanidine/baclofen combo.    Mood/cognition:   Mood is ok with only occasional mild depression and no anxiety.     She denies any major difficulties with cognitive function. She has difficulty focusing but is doing better with phentermine.  Left hip pain:   She has more pain in the left hip.    MS History:  In 2011, onset of left arm and leg numbness and weakness.  Her symptoms develop over 1 day. She had an MRI performed with lesions consistent with MS. She [facility] She was placed on Rebif. However, she stopped because her legs felt weaker when she was on it. She then transferred her care [facility].     She was started on Tysabri.  She had several infusions of Tysabri but [place] [facility] Therefore, Dr. Renne CriglerPharr switched her to Rituxan although she tolerated the medication well. She had a significant relapse with bilateral leg weakness within a couple weeks of the infusion. Therefore, 6 months later, she started Tysabri again. Due to difficulties with transportation, she transferred care to us in October 2016.   REVIEW OF SYSTEMS: Out of a complete 14 system review of symptoms, the patient complains only of the following symptoms, weakness, numbness, spacticity, imbalance, gait disturbance, frequent urination and all other reviewed systems are negative.  ALLERGIES: No Known Allergies  HOME MEDICATIONS: Outpatient Medications Prior to Visit  Medication Sig Dispense Refill  . baclofen (LIORESAL) 10 MG tablet Take one or two pills 3 times a day (up to 6/day) 180 tablet 11  . dalfampridine 10 MG TB12 TAKE ONE TABLET BY MOUTH TWICE DAILY (APPROXIMATELY 12 HOURS APART). MAY BE TAKEN WITH OR WITHOUT FOOD. DO NOT CUT OR CRUSH. STORE AT ROOM TEMPERATURE 180 tablet 1  . hydrochlorothiazide (HYDRODIURIL) 12.5 MG tablet Take 1 tablet  (12.5 mg total) by mouth daily. 90 tablet 3  . hydrOXYzine (ATARAX/VISTARIL) 25 MG tablet Take 25 mg by mouth at bedtime.   0  . LORazepam (ATIVAN) 1 MG tablet One po qHS prn 30 tablet 5  . natalizumab (TYSABRI) 300 MG/15ML injection Inject 300 mg into the vein every  30 (thirty) days.    Marland Kitchen. oxybutynin (DITROPAN) 5 MG tablet Take 1 tablet (5 mg total) by mouth 2 (two) times daily. 60 tablet 11  . phentermine (ADIPEX-P) 37.5 MG tablet TAKE 1 TAB BY MOUTH EVERY DAY IN THE MORNING. 30 tablet 5  . triamcinolone (KENALOG) 0.025 % ointment Apply 1 application topically 2 (two) times daily as needed (rash).   0  . Vitamin D, Ergocalciferol, (DRISDOL) 1.25 MG (50000 UT) CAPS capsule TAKE 1 CAPSULE (50,000 UNITS TOTAL) BY MOUTH EVERY 7 (SEVEN) DAYS. 13 capsule 3   No facility-administered medications prior to visit.     PAST MEDICAL HISTORY: Past Medical History:  Diagnosis Date  . Movement disorder   . MS (multiple sclerosis) (HCC)     PAST SURGICAL HISTORY: Past Surgical History:  Procedure Laterality Date  . CESAREAN SECTION      FAMILY HISTORY: Family History  Problem Relation Age of Onset  . Asthma Mother   . Hypertension Father   . Healthy Sister   . Healthy Brother     SOCIAL HISTORY: Social History   Socioeconomic History  . Marital status: Single    Spouse name: Not on file  . Number of children: Not on file  . Years of education: Not on file  . Highest education level: Not on file  Occupational History  . Not on file  Social Needs  . Financial resource strain: Not on file  . Food insecurity    Worry: Not on file    Inability: Not on file  . Transportation needs    Medical: Not on file    Non-medical: Not on file  Tobacco Use  . Smoking status: Never Smoker  . Smokeless tobacco: Never Used  Substance and Sexual Activity  . Alcohol use: No  . Drug use: No  . Sexual activity: Not Currently    Birth control/protection: Injection  Lifestyle  . Physical activity     Days per week: Not on file    Minutes per session: Not on file  . Stress: Not on file  Relationships  . Social Musicianconnections    Talks on phone: Not on file    Gets together: Not on file    Attends religious service: Not on file    Active member of club or organization: Not on file    Attends meetings of clubs or organizations: Not on file    Relationship status: Not on file  . Intimate partner violence    Fear of current or ex partner: Not on file    Emotionally abused: Not on file    Physically abused: Not on file    Forced sexual activity: Not on file  Other Topics Concern  . Not on file  Social History Narrative  . Not on file      PHYSICAL EXAM  Vitals:   05/21/19 1014  BP: 133/90  Pulse: 79  Temp: 97.7 F (36.5 C)  TempSrc: Oral  Height: 5\' 2"  (1.575 m)   Body mass index is 26.33 kg/m.  Generalized: Well developed, in no acute distress  Cardiology: normal rate and rhythm, no murmur noted Neurological examination  Mentation: Alert oriented to time, place, history taking. Follows all commands speech and language fluent Cranial nerve II-XII: Pupils were equal round reactive to light. Extraocular movements were full, visual field were full on confrontational test. Facial sensation and strength were normal. Uvula tongue midline. Head turning and shoulder shrug  were normal and symmetric. Motor: The motor  testing reveals 5 over 5 strength of right upper extremity,3/5 of left upper flexion, 2/5 left upper extension, 2/5 bilateral lower extremities. Good symmetric motor tone is noted throughout.  Sensory: Sensory testing is intact to soft touch on all 4 extremities. No evidence of extinction is noted.  Coordination: Cerebellar testing reveals good finger-nose-finger with right hand, unable to perform with left hand and bilateral lower extremities.   Gait and station: Unable to ambulate, patient is in wheelchair today. Reflexes: Deep tendon reflexes are symmetric and  normal bilaterally.   DIAGNOSTIC DATA (LABS, IMAGING, TESTING) - I reviewed patient records, labs, notes, testing and imaging myself where available.  No flowsheet data found.   Lab Results  Component Value Date   WBC 9.8 09/20/2018   HGB 10.6 (L) 09/20/2018   HCT 32.3 (L) 09/20/2018   MCV 85 09/20/2018   PLT 204 09/20/2018      Component Value Date/Time   NA 139 07/13/2018 0534   K 3.7 07/13/2018 0534   CL 108 07/13/2018 0534   CO2 22 07/13/2018 0534   GLUCOSE 119 (H) 07/13/2018 0534   BUN <5 (L) 07/13/2018 0534   CREATININE 0.63 07/13/2018 0534   CALCIUM 8.2 (L) 07/13/2018 0534   PROT 5.5 (L) 07/12/2018 0606   ALBUMIN 3.1 (L) 07/12/2018 0606   AST 24 07/12/2018 0606   ALT 17 07/12/2018 0606   ALKPHOS 49 07/12/2018 0606   BILITOT 0.8 07/12/2018 0606   GFRNONAA >60 07/13/2018 0534   GFRAA >60 07/13/2018 0534   No results found for: CHOL, HDL, LDLCALC, LDLDIRECT, TRIG, CHOLHDL No results found for: HGBA1C No results found for: VITAMINB12 No results found for: TSH     ASSESSMENT AND PLAN 33 y.o. year old female  has a past medical history of Movement disorder and MS (multiple sclerosis) ([place]). here with     ICD-10-CM   1. Multiple sclerosis ([place])  G35 Vitamin D, 25-hydroxy    CBC with Differential/Platelets    CMP    Stratify JCV Ab (w/ Index) w/ Rflx    Ambulatory referral to Physical Therapy    MR BRAIN W WO CONTRAST    MR CERVICAL SPINE W WO CONTRAST  2. Vitamin D deficiency  E55.9 Vitamin D, 25-hydroxy  3. Bilateral leg weakness  R29.898 Ambulatory referral to Physical Therapy  4. 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 Gait disturbance  R26.9 Ambulatory referral to Physical Therapy    Deyjah feels that Terrence Dupont symptoms are fairly stable.  Unfortunately, she has needed an updated office visit to continue Tysabri.  We have received sign release and will reorder  Tysabri infusions.  I will update labs today as well.  We will anticipate replacing vitamin D as needed.  She will continue current treatment plan.  I would also like for her to participate in physical therapy for spasticity and hemiplegia.  We will order updated MRI.  I would like for her to follow-up in 4 to 6 months with Dr. Ladonna Snide.  She verbalizes understanding and agreement with this plan.   Orders Placed This Encounter  Procedures  . MR BRAIN W WO CONTRAST    MS protocol    Standing Status:   Future    Standing Expiration Date:   07/20/2020    Order Specific Question:   If indicated for the ordered procedure, I authorize the administration of contrast media per Radiology protocol    Answer:  Yes    Order Specific Question:   What is the patient's sedation requirement?    Answer:   No Sedation    Order Specific Question:   Does the patient have a pacemaker or implanted devices?    Answer:   No    Order Specific Question:   Radiology Contrast Protocol - do NOT remove file path    Answer:   \\[email or web address]    Order Specific Question:   Preferred imaging location?    Answer:   Internal  . MR CERVICAL SPINE W WO CONTRAST    MS protocol    Standing Status:   Future    Standing Expiration Date:   07/20/2020    Order Specific Question:   If indicated for the ordered procedure, I authorize the administration of contrast media per Radiology protocol    Answer:   Yes    Order Specific Question:   What is the patient's sedation requirement?    Answer:   No Sedation    Order Specific Question:   Does the patient have a pacemaker or implanted devices?    Answer:   No    Order Specific Question:   Radiology Contrast Protocol - do NOT remove file path    Answer:   \\[email or web address]    Order Specific Question:   Preferred imaging location?    Answer:   Internal  . Vitamin D, 25-hydroxy  . CBC with Differential/Platelets  . CMP  .  Stratify JCV Ab (w/ Index) w/ Rflx  . Ambulatory referral to Physical Therapy    Referral Priority:   Routine    Referral Type:   Physical Medicine    Referral Reason:   Specialty Services Required    Requested Specialty:   Physical Therapy    Number of Visits Requested:   1     No orders of the defined types were placed in this encounter.     I spent 45 minutes with the patient. 50% of this time was spent counseling and educating patient on plan of care and medications.    Shawnie Dapper, FNP-C 05/21/2019, 1:03 PM [facility] [address] [place], [place] [address] [phone]

## 2019-05-21 NOTE — Progress Notes (Signed)
I have read the note, and I agree with the clinical assessment and plan.  Morelia Cassells A. Yovanny Coats, MD, PhD, FAAN Certified in Neurology, Clinical Neurophysiology, Sleep Medicine, Pain Medicine and Neuroimaging  [facility] [address], [address] , [place] [address] [phone]  

## 2019-05-22 ENCOUNTER — Telehealth: Payer: Self-pay

## 2019-05-22 LAB — CBC WITH DIFFERENTIAL/PLATELET
Basophils Absolute: 0 10*3/uL (ref 0.0–0.2)
Basos: 0 %
EOS (ABSOLUTE): 0.8 10*3/uL — ABNORMAL HIGH (ref 0.0–0.4)
Eos: 10 %
Hematocrit: 34.6 % (ref 34.0–46.6)
Hemoglobin: 11.3 g/dL (ref 11.1–15.9)
Immature Grans (Abs): 0 10*3/uL (ref 0.0–0.1)
Immature Granulocytes: 0 %
Lymphocytes Absolute: 2.7 10*3/uL (ref 0.7–3.1)
Lymphs: 32 %
MCH: 27.6 pg (ref 26.6–33.0)
MCHC: 32.7 g/dL (ref 31.5–35.7)
MCV: 85 fL (ref 79–97)
Monocytes Absolute: 0.7 10*3/uL (ref 0.1–0.9)
Monocytes: 9 %
Neutrophils Absolute: 4.1 10*3/uL (ref 1.4–7.0)
Neutrophils: 49 %
Platelets: 227 10*3/uL (ref 150–450)
RBC: 4.09 x10E6/uL (ref 3.77–5.28)
RDW: 14.6 % (ref 11.7–15.4)
WBC: 8.4 10*3/uL (ref 3.4–10.8)

## 2019-05-22 LAB — COMPREHENSIVE METABOLIC PANEL
ALT: 16 IU/L (ref 0–32)
AST: 19 IU/L (ref 0–40)
Albumin/Globulin Ratio: 1.8 (ref 1.2–2.2)
Albumin: 4.2 g/dL (ref 3.8–4.8)
Alkaline Phosphatase: 91 IU/L (ref 39–117)
BUN/Creatinine Ratio: 7 — ABNORMAL LOW (ref 9–23)
BUN: 4 mg/dL — ABNORMAL LOW (ref 6–20)
Bilirubin Total: 0.3 mg/dL (ref 0.0–1.2)
CO2: 24 mmol/L (ref 20–29)
Calcium: 9.4 mg/dL (ref 8.7–10.2)
Chloride: 104 mmol/L (ref 96–106)
Creatinine, Ser: 0.56 mg/dL — ABNORMAL LOW (ref 0.57–1.00)
GFR calc Af Amer: 144 mL/min/{1.73_m2} (ref >59)
GFR calc non Af Amer: 125 mL/min/{1.73_m2} (ref >59)
Globulin, Total: 2.3 g/dL (ref 1.5–4.5)
Glucose: 80 mg/dL (ref 65–99)
Potassium: 3.9 mmol/L (ref 3.5–5.2)
Sodium: 139 mmol/L (ref 134–144)
Total Protein: 6.5 g/dL (ref 6.0–8.5)

## 2019-05-22 LAB — VITAMIN D 25 HYDROXY (VIT D DEFICIENCY, FRACTURES): Vit D, 25-Hydroxy: 71.7 ng/mL (ref 30.0–100.0)

## 2019-05-22 NOTE — Telephone Encounter (Signed)
Unable to get in contact with the patient. LVM with the patient's results. Office number was provided in case she has any questions or concerns.

## 2019-05-22 NOTE — Telephone Encounter (Signed)
-----   Message from Debbora Presto, NP sent at 05/22/2019 11:14 AM EDT ----- Labs are stable. Vit D is really good.

## 2019-05-28 ENCOUNTER — Telehealth: Payer: Self-pay | Admitting: *Deleted

## 2019-05-28 NOTE — Telephone Encounter (Signed)
Fax confirmation received for tysabri [phone] [place].

## 2019-05-29 NOTE — Telephone Encounter (Signed)
[email or web address]  has called to inform RN Lovey Newcomer they do not do the type infusion the order is for.  Maudie Mercury states it is done in the office next door to them (Patient Lindsay Schneider) Maudie Mercury can be reached at [phone]

## 2019-05-29 NOTE — Telephone Encounter (Signed)
I refaxed to them the orders at pt care center for tysabri [phone].  Fax confirmation received.

## 2019-05-29 NOTE — Telephone Encounter (Signed)
Called Kim at [facility], pt is getting her infusions at pt care center.

## 2019-05-30 ENCOUNTER — Other Ambulatory Visit: Payer: Self-pay | Admitting: Neurology

## 2019-06-02 NOTE — Telephone Encounter (Signed)
Received JCV antibody results from [facility].  Index 3.89.

## 2019-06-04 NOTE — Telephone Encounter (Signed)
I have spoken with the patient today regarding recent positive JCV antibody.  I have discussed with her the need to discontinue Tysabri infusions.  We have discussed alternate DMT therapy.  Dr. Felecia Shelling, we feel that she would do best on Ocrevus infusions.  She was educated on this medication and the need for additional blood work.  I will asked Lovey Newcomer to initiate Ocrevus start form as well as any other Neurosurgeon for Ocrevus.  Patient will come to the office on Monday 11/16 for blood work and to pick up Scientist, clinical (histocompatibility and immunogenetics).  Also signed start form at that time.  She verbalizes understanding with this plan and will call with any new or worsening symptoms.  Last Tysabri infusion was September 2020.

## 2019-06-05 NOTE — Telephone Encounter (Signed)
Ocrevus P/w at desk (completed needs pt signature and AL/NP signature).

## 2019-06-09 ENCOUNTER — Other Ambulatory Visit: Payer: Self-pay | Admitting: Family Medicine

## 2019-06-09 DIAGNOSIS — G35 Multiple sclerosis: Secondary | ICD-10-CM

## 2019-06-10 ENCOUNTER — Ambulatory Visit: Payer: Medicare Other | Attending: Family Medicine | Admitting: Physical Therapy

## 2019-06-17 ENCOUNTER — Other Ambulatory Visit: Payer: Medicare Other

## 2019-06-27 ENCOUNTER — Ambulatory Visit: Payer: Medicare Other

## 2019-07-02 NOTE — Telephone Encounter (Signed)
I called pt to remind her about lab work for the ocrevus.  (she needs to signs p/w too.).  She has not been able to get ride.

## 2019-07-03 ENCOUNTER — Ambulatory Visit: Payer: Medicare Other | Admitting: Neurology

## 2019-07-03 ENCOUNTER — Telehealth: Payer: Self-pay | Admitting: Family Medicine

## 2019-07-03 NOTE — Telephone Encounter (Signed)
Received d/c form, waiting on MD signature

## 2019-07-03 NOTE — Telephone Encounter (Signed)
[name] @ [facility] is asking for a call from [place] to discuss if pt will continue with Tysabri or not, please call.

## 2019-07-03 NOTE — Telephone Encounter (Signed)
This pt has transportation issues and has not been here for her labs or to initiate ocrevus.  I spoke to her this week and she stated she still plans on doing and signing p/w.  You do not want to continue her tysabri from your phone note.  (I will relay to [facility]) correct?

## 2019-07-03 NOTE — Telephone Encounter (Signed)
I called LMVM for pt that trying to reach her for lab draw to start process for ocrevus.  She had last infusion for tysabri 04-25-19 and did not have 06-23-19 infusion at pt care center.  They will fax discontinuation form for [place] to fill out.

## 2019-07-03 NOTE — Telephone Encounter (Signed)
She has positive JCV so she can not continue Tysabri. We really need to have her on Ocrevus or some other medication if she is not able to get needed labs for infusion. Was she able to get here for tysabri infusion? Can she make accommodations? Any community support options? She does need to be on treatment but Tysabri could cause her more trouble.

## 2019-07-15 ENCOUNTER — Ambulatory Visit: Payer: Medicare Other | Admitting: Family Medicine

## 2019-07-30 ENCOUNTER — Ambulatory Visit: Payer: Medicare Other | Attending: Family Medicine | Admitting: Physical Therapy

## 2019-08-06 NOTE — Telephone Encounter (Signed)
I have placed copy of ocrevus enrollment signature form for pt to sign and mailed to her as she has not come in for labs or signing of form.

## 2019-08-07 NOTE — Telephone Encounter (Signed)
Please reach back out to see if we can get in touch with her. We can not start treatment until she is able to do these things.

## 2019-08-11 NOTE — Telephone Encounter (Signed)
Received disability forms for pt today. (dept of education).

## 2019-08-12 DIAGNOSIS — Z0289 Encounter for other administrative examinations: Secondary | ICD-10-CM

## 2019-08-19 ENCOUNTER — Telehealth: Payer: Self-pay | Admitting: *Deleted

## 2019-08-19 NOTE — Telephone Encounter (Signed)
I called pt and LMVM for her to call me back.  (she has not been in for her labs or sign p/w for ocrevus).  She had someone drop off disability forms.

## 2019-08-19 NOTE — Telephone Encounter (Signed)
Pt called but hung up.  I did not get return call (phone #??)

## 2019-08-19 NOTE — Telephone Encounter (Signed)
I attempted to call patient as well. No answer but I did leave voice mail. I also called her aunt, Angelique Blonder, on DPR> She answered and reports that she is going to see Yasira today. I have asked her to relay the message that we need to talk to her asap regarding medication change. She took our number and will ask Braniya to call us back. We need to take care of getting her back on DMT.

## 2019-08-20 NOTE — Telephone Encounter (Signed)
I called and LMVM for pt to return call about disability form and MS treatment.

## 2019-09-08 ENCOUNTER — Encounter: Payer: Self-pay | Admitting: *Deleted

## 2019-09-08 ENCOUNTER — Telehealth: Payer: Self-pay | Admitting: Family Medicine

## 2019-09-08 NOTE — Telephone Encounter (Signed)
I called pt and spoke to family member that stated she will call back in 10-15 minutes.  I save appt for ALNP at 09-10-19 at 1330.

## 2019-09-08 NOTE — Telephone Encounter (Signed)
I called and LMVM for pt to call back regarding her MS, treatment, disability p/w. Will send letter (certified).

## 2019-09-08 NOTE — Telephone Encounter (Signed)
Andrey Campanile, can we please try again to reach [name]. I do have disability papers but unfortunately can not fill these out without seeing her. We need to determine an alternate plan of care for MS. She is now off treatment for three months. We can set up a video visit if she is unable to come in. If she can not get transportation for Ocrevus, we can discuss other oral options. If you can't reach her, please send a letter. TY.

## 2019-09-11 ENCOUNTER — Other Ambulatory Visit: Payer: Self-pay | Admitting: Neurology

## 2019-09-18 ENCOUNTER — Telehealth: Payer: Self-pay | Admitting: *Deleted

## 2019-09-18 NOTE — Telephone Encounter (Signed)
I was unable to reach pt. I mailed pt disability form to pt home address. A refund will be mail to pt.

## 2019-10-22 ENCOUNTER — Ambulatory Visit (INDEPENDENT_AMBULATORY_CARE_PROVIDER_SITE_OTHER): Payer: Medicare Other | Admitting: Neurology

## 2019-10-22 ENCOUNTER — Other Ambulatory Visit: Payer: Self-pay

## 2019-10-22 ENCOUNTER — Encounter: Payer: Self-pay | Admitting: Neurology

## 2019-10-22 VITALS — BP 136/98 | HR 76 | Temp 97.6°F | Ht 62.0 in

## 2019-10-22 DIAGNOSIS — R29898 Other symptoms and signs involving the musculoskeletal system: Secondary | ICD-10-CM | POA: Diagnosis not present

## 2019-10-22 DIAGNOSIS — G35 Multiple sclerosis: Secondary | ICD-10-CM

## 2019-10-22 DIAGNOSIS — N399 Disorder of urinary system, unspecified: Secondary | ICD-10-CM

## 2019-10-22 DIAGNOSIS — G8114 Spastic hemiplegia affecting left nondominant side: Secondary | ICD-10-CM

## 2019-10-22 DIAGNOSIS — Z114 Encounter for screening for human immunodeficiency virus [HIV]: Secondary | ICD-10-CM | POA: Diagnosis not present

## 2019-10-22 DIAGNOSIS — Z79899 Other long term (current) drug therapy: Secondary | ICD-10-CM

## 2019-10-22 NOTE — Progress Notes (Addendum)
[facility]  PATIENT: Lindsay Schneider DOB: [birth year]    _________________________________   HISTORICAL  CHIEF COMPLAINT:  Chief Complaint  Patient presents with  . Follow-up    RM 12 with friend (temp: 97.4). Last seen 05/21/2019. In wheelchair in office today, denies any falls.   . Multiple Sclerosis    Stopped tysabri d/t positive JCV. Wanted to change to Ocrevus but she was unreachable to initiate therapy.     HISTORY OF PRESENT ILLNESS:  Lindsay Schneider is a 34 y.o. woman who was diagnosed with MS in 2011 after presenting with left sided arm and leg numbness and weakness.    She has an active/relapsing secondary progressive MS.    Update 10/22/2019: She was on Tysabri and tolerates it well.   Her last infusion was a couple months ago.   She converted to JCV Ab positive status when last checked 05/21/2019.   We had discussed options and she would like to start Ocrevus.   She never started the medication.   We also discussed Kesimta but she is unable to do an intramuscular shot.     She does not want to get the Covid 19 vaccination.   We discussed that I recommend that she gets the vaccination.    Her left side has always been more involved than her with and she has weakness with spasticity.   The right leg is also weak.     She is wheelchair bound,  She needs assistance with transfers but bears some weight.    She is noting more intermittent numbness on the left side.   These occur randomly but only on the left side.    She notes urinary urge incontinence.    She has reduced VA and color vision OS.      Update 09/20/2018: Her MS has been stable.   She is on Tysabri and tolerates it well.  She is JCV negative (0.27; neg inhib assay).     No exacerbations.    She is wheelchair bound and needs help with transfers.     Her right arm is strong but she has bilateral leg and left arm weakness and spasticity.      Vision is doing well.    She denies any problems with  color vision.      No vertigo.     She has bladder urgency and frequency.    She has nocturia.    She feels fatigue is better on phentermine.   She tolerates it well.   Focus and attention are better on Adderall.   She is back in school.      She has some sleep onset insomnia but sleep maintenance is reasonably good.        Mood is doing well.    She is seeing a dermatologist for a rash and is on Kenalog cream.     Update 08/28/2017: She has no new MS exacerbations and impairments are stable.  She tolerates Tysabri well.  She actually has done slightly better this year.   She can dress independently now and was requiring help to get pants on.   She has severe leg weakness, moderate left arm weakness, all with spasticity.   Her right arm is strong      She has a rash on her arms progressing over the past couple weeks.   She has dark roundish spots from head to legs.   She had a biopsy at [facility] yesterday.  No treatments were provided yet.     From 12/27/2016: MS:   She is on Tysabri. Her JCV Ab titers have been negative.   She tolerates Tysabri well.  She has had no definite exacerbation.       She has been on Tysabri for about 18 months but had been on it previously as well.  Gait/strength/sensation: She has had more weakness in her legs, left worse than right.   Her left arm is weak but right arm is strong.    She is whheelchair bound and has needed more help with transfers.    She also has left side spasticity.   Handwriting is mildly better (right hand) but she tires out and her hand shakes when that occurs.     She takes baclofen 90 mg a day and tizanidine only once a day .   Higher dose was not well tolerated    Sometimes, she hgets severe right leg spasms where the leg locks up.   This mostly occurs when she is being moved.     Bladder:   She has difficulty with her bladder. She has urinary urgency and frequency and will have occasional incontinence if she cannot get to the bathroom and  transfer in time.  Oxybutynin only helpe a little bit so she stopped.     Vision:   She denies any difficulty with her vision.  Vision is blurry in the mornings.   .    Fatigue:   She has physical fatigue.   Her fatigue improved when phentermine was started.   She also has been able to lose a couple pounds.  She is sleeping ok most nights on tizanidine/baclofen combo.    Mood/cognition:   Mood is ok with only occasional mild depression and no anxiety.     She denies any major difficulties with cognitive function. She has difficulty focusing but is doing better with phentermine.  Left hip pain:   She has more pain in the left hip.    MS History:  In 2011, onset of left arm and leg numbness and weakness.  Her symptoms develop over 1 day. She had an MRI performed with lesions consistent with MS. She started to see Dr. Terrace Arabia here at [facility]. She was placed on Rebif. However, she stopped because her legs felt weaker when she was on it. She then transferred her care to Dr. Renne Crigler at [facility].     She was started on Tysabri.  She had several infusions of Tysabri but transportation was difficult to get to [facility]. Therefore, Dr. Renne Crigler switched her to Rituxan although she tolerated the medication well. She had a significant relapse with bilateral leg weakness within a couple weeks of the infusion. Therefore, 6 months later, she started Tysabri again. Due to difficulties with transportation, she transferred care to [place] in October 2016.   REVIEW OF SYSTEMS: Constitutional: No fevers, chills, sweats, or change in appetite.   She has fatigue and poor sleep.    Eyes: No visual changes, double vision, eye pain Ear, nose and throat: No hearing loss, ear pain, nasal congestion, sore throat Cardiovascular: No chest pain, palpitations Respiratory: No shortness of breath at rest or with exertion.   No wheezes GastrointestinaI: No nausea, vomiting, diarrhea, abdominal pain, fecal incontinence Genitourinary: Shehas  urinary urgency and frequency with occ incontinence.    Hasnocturia. Musculoskeletal: No neck pain, back pain Integumentary: No rash, pruritus, skin lesions Neurological: as above Psychiatric:   Some depression at this  time.  No anxiety Endocrine: No palpitations, diaphoresis, change in appetite, change in weigh or increased thirst Hematologic/Lymphatic: No anemia, purpura, petechiae. Allergic/Immunologic: No itchy/runny eyes, nasal congestion, recent allergic reactions, rashes  ALLERGIES: No Known Allergies  HOME MEDICATIONS:  Current Outpatient Medications:  .  baclofen (LIORESAL) 10 MG tablet, TAKE 1 TO 2 TABLETS 3 TIMES A DAY, Disp: 540 tablet, Rfl: 3 .  dalfampridine 10 MG TB12, TAKE ONE TABLET BY MOUTH TWICE DAILY (APPROXIMATELY 12 HOURS APART). MAY BE TAKEN WITH OR WITHOUT FOOD. DO NOT CUT OR CRUSH. STORE AT ROOM TEMPERATURE., Disp: 180 tablet, Rfl: 1 .  hydrochlorothiazide (HYDRODIURIL) 12.5 MG tablet, Take 1 tablet (12.5 mg total) by mouth daily., Disp: 90 tablet, Rfl: 3 .  hydrOXYzine (ATARAX/VISTARIL) 25 MG tablet, Take 25 mg by mouth at bedtime. , Disp: , Rfl: 0 .  LORazepam (ATIVAN) 1 MG tablet, One po qHS prn, Disp: 30 tablet, Rfl: 5 .  oxybutynin (DITROPAN) 5 MG tablet, Take 1 tablet (5 mg total) by mouth 2 (two) times daily., Disp: 60 tablet, Rfl: 11 .  phentermine (ADIPEX-P) 37.5 MG tablet, TAKE 1 TAB BY MOUTH EVERY DAY IN THE MORNING., Disp: 30 tablet, Rfl: 5 .  triamcinolone (KENALOG) 0.025 % ointment, Apply 1 application topically 2 (two) times daily as needed (rash). , Disp: , Rfl: 0 .  Vitamin D, Ergocalciferol, (DRISDOL) 1.25 MG (50000 UT) CAPS capsule, TAKE 1 CAPSULE (50,000 UNITS TOTAL) BY MOUTH EVERY 7 (SEVEN) DAYS., Disp: 13 capsule, Rfl: 3  PAST MEDICAL HISTORY: Past Medical History:  Diagnosis Date  . Movement disorder   . MS (multiple sclerosis) (HCC)     PAST SURGICAL HISTORY: Past Surgical History:  Procedure Laterality Date  . CESAREAN SECTION        FAMILY HISTORY: Family History  Problem Relation Age of Onset  . Asthma Mother   . Hypertension Father   . Healthy Sister   . Healthy Brother     SOCIAL HISTORY:  Social History   Socioeconomic History  . Marital status: Single    Spouse name: Not on file  . Number of children: Not on file  . Years of education: Not on file  . Highest education level: Not on file  Occupational History  . Not on file  Tobacco Use  . Smoking status: Never Smoker  . Smokeless tobacco: Never Used  Substance and Sexual Activity  . Alcohol use: No  . Drug use: No  . Sexual activity: Not Currently    Birth control/protection: Injection  Other Topics Concern  . Not on file  Social History Narrative  . Not on file   Social Determinants of Health   Financial Resource Strain:   . Difficulty of Paying Living Expenses:   Food Insecurity:   . Worried About Programme researcher, broadcasting/film/video in the Last Year:   . Barista in the Last Year:   Transportation Needs:   . Freight forwarder (Medical):   Marland Kitchen Lack of Transportation (Non-Medical):   Physical Activity:   . Days of Exercise per Week:   . Minutes of Exercise per Session:   Stress:   . Feeling of Stress :   Social Connections:   . Frequency of Communication with Friends and Family:   . Frequency of Social Gatherings with Friends and Family:   . Attends Religious Services:   . Active Member of Clubs or Organizations:   . Attends Banker Meetings:   Marland Kitchen Marital Status:  Intimate Partner Violence:   . Fear of Current or Ex-Partner:   . Emotionally Abused:   Marland Kitchen Physically Abused:   . Sexually Abused:      PHYSICAL EXAM  Vitals:   10/22/19 1057  BP: (!) 136/98  Pulse: 76  Temp: 97.6 F (36.4 C)  SpO2: 94%  Height: 5\' 2"  (1.575 m)    Body mass index is 26.33 kg/m.   General: The patient is well-developed and well-nourished and in no acute distress  Skin:   She has a hyperpigmented rash on the arms, legs and  trunk.  Neurologic Exam  Mental status: The patient is alert and oriented x 3 at the time of the examination. The patient has apparent normal recent and remote memory, with an apparently normal attention span and concentration ability.   Speech is normal.  Cranial nerves: Extraocular movements are full.. Facial strength and sensation is normal. Trapezius strength is normal.  The tongue is midline, and the patient has symmetric elevation of the soft palate. No obvious hearing deficits are noted.  Motor: Muscle bulk is normal.  Muscle tone is greatly increased in the left arm and both legs, left greater than right.   Right arm has good tone/strength but reduced RM.  Strength is 3 to 4-/5 in the left distal arm and 2+ in the proximal arm. Strength is 2  in proximal right and left leg and 2+ in right knee extension and 2 in left knee extension , 2- right and  1 left ankle extensors,    Sensory: Intact sensation to touch and vibration in the arms.  She has reduced vibration sensation in the left leg.  Coordination: Cerebellar testing reveals poor left finger-nose-finger and she can't do heel-to-shin bilaterally.  Gait and station: She cannot stand or walk.  Reflexes: Deep tendon reflexes are increased in arms, left > right and in legs with spread att knees and sustained clonus at the ankles.       DIAGNOSTIC DATA (LABS, IMAGING, TESTING) - I reviewed patient records, labs, notes, testing and imaging myself where available.  Lab Results  Component Value Date   WBC 8.4 05/21/2019   HGB 11.3 05/21/2019   HCT 34.6 05/21/2019   MCV 85 05/21/2019   PLT 227 05/21/2019      Component Value Date/Time   NA 139 05/21/2019 1057   K 3.9 05/21/2019 1057   CL 104 05/21/2019 1057   CO2 24 05/21/2019 1057   GLUCOSE 80 05/21/2019 1057   GLUCOSE 119 (H) 07/13/2018 0534   BUN 4 (L) 05/21/2019 1057   CREATININE 0.56 (L) 05/21/2019 1057   CALCIUM 9.4 05/21/2019 1057   PROT 6.5 05/21/2019 1057    ALBUMIN 4.2 05/21/2019 1057   AST 19 05/21/2019 1057   ALT 16 05/21/2019 1057   ALKPHOS 91 05/21/2019 1057   BILITOT 0.3 05/21/2019 1057   GFRNONAA 125 05/21/2019 1057   GFRAA 144 05/21/2019 1057       ASSESSMENT AND PLAN  Multiple sclerosis (HCC) - Plan: Hepatic function panel, Hepatitis B core antibody, total, Hepatitis B surface antigen, Hepatitis B surface antibody,qualitative, QuantiFERON-TB Gold Plus, HIV Antibody (routine testing w rflx), CBC with Differential/Platelet  High risk medication use - Plan: Hepatic function panel, Hepatitis B core antibody, total, Hepatitis B surface antigen, Hepatitis B surface antibody,qualitative, QuantiFERON-TB Gold Plus, HIV Antibody (routine testing w rflx), CBC with Differential/Platelet  Encounter for screening for HIV - Plan: HIV Antibody (routine testing w rflx)  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    She recently converted from JCV negative to JCV high titer positive.  Therefore, the risks of Tysabri have greatly increased and I recommend a switch to a different medication.  We went over multiple options.  She is most interested in [place].  We will check some blood work to rule out chronic infections as she signed a service request form.   2.   Continue oxybutynin for bladder twice a day.  Continue lorazepam nightly to help with her insomnia and nighttime spasticity..  3.    Continue Phentermine for fatigue, attention/focusweight loss.    4.    Return in 6 months or sooner if there are new or worsening neurologic symptoms.  45-minute office visit with the majority of the time spent face-to-face for history and physical, discussion/counseling and decision-making.  Additional time with record review and documentation.   Ephraim Reichel A. Felecia Shelling, MD, PhD 7/93/9030, 09:23 AM Certified in Neurology, Clinical Neurophysiology, Sleep Medicine, Pain Medicine and  Neuroimaging  [facility] [address], [place], [place] [address] [phone]

## 2019-10-25 LAB — QUANTIFERON-TB GOLD PLUS
QuantiFERON Mitogen Value: >10 IU/mL
QuantiFERON Nil Value: 0 IU/mL
QuantiFERON TB1 Ag Value: 0.01 IU/mL
QuantiFERON TB2 Ag Value: 0.02 IU/mL
QuantiFERON-TB Gold Plus: NEGATIVE

## 2019-10-25 LAB — CBC WITH DIFFERENTIAL/PLATELET
Basophils Absolute: 0 10*3/uL (ref 0.0–0.2)
Basos: 0 %
EOS (ABSOLUTE): 0.5 10*3/uL — ABNORMAL HIGH (ref 0.0–0.4)
Eos: 6 %
Hematocrit: 38.8 % (ref 34.0–46.6)
Hemoglobin: 12.5 g/dL (ref 11.1–15.9)
Immature Grans (Abs): 0 10*3/uL (ref 0.0–0.1)
Immature Granulocytes: 0 %
Lymphocytes Absolute: 1.8 10*3/uL (ref 0.7–3.1)
Lymphs: 20 %
MCH: 27.3 pg (ref 26.6–33.0)
MCHC: 32.2 g/dL (ref 31.5–35.7)
MCV: 85 fL (ref 79–97)
Monocytes Absolute: 0.6 10*3/uL (ref 0.1–0.9)
Monocytes: 6 %
Neutrophils Absolute: 6.3 10*3/uL (ref 1.4–7.0)
Neutrophils: 68 %
Platelets: 240 10*3/uL (ref 150–450)
RBC: 4.58 x10E6/uL (ref 3.77–5.28)
RDW: 15.8 % — ABNORMAL HIGH (ref 11.7–15.4)
WBC: 9.4 10*3/uL (ref 3.4–10.8)

## 2019-10-25 LAB — HEPATIC FUNCTION PANEL
ALT: 14 IU/L (ref 0–32)
AST: 13 IU/L (ref 0–40)
Albumin: 4.2 g/dL (ref 3.8–4.8)
Alkaline Phosphatase: 92 IU/L (ref 39–117)
Bilirubin Total: 0.4 mg/dL (ref 0.0–1.2)
Bilirubin, Direct: 0.13 mg/dL (ref 0.00–0.40)
Total Protein: 6.5 g/dL (ref 6.0–8.5)

## 2019-10-25 LAB — HEPATITIS B CORE ANTIBODY, TOTAL: Hep B Core Total Ab: NEGATIVE

## 2019-10-25 LAB — HEPATITIS B SURFACE ANTIBODY,QUALITATIVE: Hep B Surface Ab, Qual: NONREACTIVE

## 2019-10-25 LAB — HIV ANTIBODY (ROUTINE TESTING W REFLEX): HIV Screen 4th Generation wRfx: NONREACTIVE

## 2019-10-25 LAB — HEPATITIS B SURFACE ANTIGEN: Hepatitis B Surface Ag: NEGATIVE

## 2019-10-28 ENCOUNTER — Telehealth: Payer: Self-pay | Admitting: *Deleted

## 2019-10-28 NOTE — Telephone Encounter (Signed)
Submitted PA Ocrevus on nctracks website. Waiting on determination.  If approved, orders will be sent to [facility] for her to be contacted to get scheduled.

## 2019-10-28 NOTE — Telephone Encounter (Addendum)
Called and spoke with patient about lab results per [name] note. She verbalized understanding.  She requested handicap placard form be filled out. Advised I will ask [name], if ok, she would like it mailed. I spoke with MD and he ok'd this. Placed completed/signed form in mail.   Catheryn Bacon PA pending with her insurance. If approved, [facility] will contact her to get scheduled. We will call her back if anything further needed.

## 2019-10-28 NOTE — Telephone Encounter (Signed)
-----   Message from Asa Lente, MD sent at 10/27/2019  5:28 PM EDT ----- the lab work was fine.  We can send in the Ocrevus to infusion

## 2019-10-30 NOTE — Telephone Encounter (Addendum)
First PA denied via Medicaid, no reason given. I re-submitted with further info. Second PA denied. I called nctracks to find out reasoning for denial. They state pt has part D plan and PA needs to be done via that plan, no card scanned in pt chart for this. Medicaid was unable to tell me what part D plan pt has. I called her Medicare plan and spoke with Patrice. She was unable to give me her Part D plan info, states I have to contact the pt directly for this.   [facility] and spoke with tech. States her Medicare Part D plan is through [employer]. ID: [id number]. RxBIN: [id number]. RXPCN: [id number]. RXGrp: [id number]. Phone#[phone]. I initiated PA on CMM. KeyMadolyn Frieze - PA Case ID: [id number]. Submitted PA and marked urgent. Determination pending with [facility] Medicare.

## 2019-11-04 NOTE — Telephone Encounter (Signed)
Received fax from [facility] that Ocrevus infusion must be billed to Medicare Part B. I called Medicare and spoke with Burnis Medin. She stated procedure code J2350 for Ocrevus does not require PA. Should Buy/Bill through Medicare part B.   Faxed completed/signed order to [facility] at [phone]. Received fax confirmation. Asked they contact pt to get scheduled.

## 2019-11-06 NOTE — Telephone Encounter (Signed)
Tried calling pt, mailbox full. Was going to let her know [facility] should be contacting her to get scheduled for Ocrevus.

## 2019-11-07 ENCOUNTER — Other Ambulatory Visit: Payer: Self-pay | Admitting: Neurology

## 2019-11-11 NOTE — Telephone Encounter (Signed)
Called pt. She has not heard from [facility] to get scheduled for her Ocrevus. She will call them at [phone] to get scheduled.

## 2019-11-18 NOTE — Telephone Encounter (Signed)
Called pt again because I see that she has not scheduled her infusion yet. She states she called and thinks she may have written number down wrong. Provided her their phone number again: [phone]. She will call to get Ocrevus infusion scheduled.

## 2019-11-19 NOTE — Telephone Encounter (Signed)
Checked and pt is scheduled at [facility] for Ocrevus 12/08/2019 at 9:30am.

## 2019-12-05 ENCOUNTER — Telehealth: Payer: Self-pay | Admitting: Neurology

## 2019-12-05 NOTE — Telephone Encounter (Signed)
[email or web address] Has called to ask if pt still needs to remain on dalfampridine 10 MG TB12 Since the medication is for helping to walk.  Leah asked because pt is unable to walk or stand

## 2019-12-08 ENCOUNTER — Ambulatory Visit (HOSPITAL_COMMUNITY)
Admission: RE | Admit: 2019-12-08 | Discharge: 2019-12-08 | Disposition: A | Discharge status: Home / Self Care | Payer: Medicare Other | Source: Ambulatory Visit | Attending: Internal Medicine | Admitting: Internal Medicine

## 2019-12-08 DIAGNOSIS — G35 Multiple sclerosis: Secondary | ICD-10-CM | POA: Diagnosis not present

## 2019-12-08 MED ORDER — METHYLPREDNISOLONE SODIUM SUCC 125 MG IJ SOLR
125.0000 mg | Freq: Once | INTRAMUSCULAR | Status: AC
  Administered 2019-12-08: 125 mg via INTRAVENOUS
  Filled 2019-12-08: qty 2

## 2019-12-08 MED ORDER — SODIUM CHLORIDE 0.9 % IV SOLN
300.0000 mg | Freq: Once | INTRAVENOUS | Status: AC
  Administered 2019-12-08: 300 mg via INTRAVENOUS
  Filled 2019-12-08: qty 10

## 2019-12-08 MED ORDER — DIPHENHYDRAMINE HCL 50 MG/ML IJ SOLN
25.0000 mg | Freq: Once | INTRAMUSCULAR | Status: AC
  Administered 2019-12-08: 25 mg via INTRAVENOUS
  Filled 2019-12-08: qty 1

## 2019-12-08 MED ORDER — ACETAMINOPHEN 325 MG PO TABS
650.0000 mg | ORAL_TABLET | Freq: Once | ORAL | Status: AC
  Administered 2019-12-08: 650 mg via ORAL
  Filled 2019-12-08: qty 2

## 2019-12-08 MED ORDER — SODIUM CHLORIDE 0.9 % IV SOLN
INTRAVENOUS | Status: DC | PRN
  Administered 2019-12-08: 250 mL via INTRAVENOUS

## 2019-12-08 NOTE — Progress Notes (Signed)
PATIENT CARE CENTER NOTE  Diagnosis: Multiple Sclerosis    Provider: Despina Arias, MD   Procedure: Ocrevus 300 mg infusion    Note: Patient received Ocrevus 300 mg infusion via PIV. Pre-medications given per order. Infusion titrated per protocol. Patient tolerated infusion well with no adverse reaction. Vital signs remained stable. Patient declined the 1 hour observation post-infusion. Patient advised to notify provider or report to the ED if any severe reaction occurred. Patient to come back in 15 days for second 300 mg dose of Ocrevus. Discharge instructions given. Patient alert, oriented and transported in wheelchair at discharge.

## 2019-12-08 NOTE — Telephone Encounter (Signed)
Called, LVM for pt to call office.  Per [name], if she feels she gets a benefit from dalfampridine with transfers, she should continue. If she is not sure, she can stop the medication for a few weeks to see if it makes a difference.

## 2019-12-08 NOTE — Discharge Instructions (Signed)
Ocrelizumab injection °What is this medicine? °OCRELIZUMAB (ok re LIZ ue mab) treats multiple sclerosis. It helps to decrease the number of multiple sclerosis relapses. It is not a cure. °This medicine may be used for other purposes; ask your health care provider or pharmacist if you have questions. °COMMON BRAND NAME(S): OCREVUS °What should I tell my health care provider before I take this medicine? °They need to know if you have any of these conditions: °· cancer °· hepatitis B infection °· other infection (especially a virus infection such as chickenpox, cold sores, or herpes) °· an unusual or allergic reaction to ocrelizumab, other medicines, foods, dyes or preservatives °· pregnant or trying to get pregnant °· breast-feeding °How should I use this medicine? °This medicine is for infusion into a vein. It is given by a health care professional in a hospital or clinic setting. °A special MedGuide will be given to you before each treatment. Be sure to read this information carefully each time. °Talk to your pediatrician regarding the use of this medicine in children. Special care may be needed. °Overdosage: If you think you have taken too much of this medicine contact a poison control center or emergency room at once. °NOTE: This medicine is only for you. Do not share this medicine with others. °What if I miss a dose? °Keep appointments for follow-up doses as directed. It is important not to miss your dose. Call your doctor or health care professional if you are unable to keep an appointment. °What may interact with this medicine? °· alemtuzumab °· daclizumab °· dimethyl fumarate °· fingolimod °· glatiramer °· interferon beta °· live virus vaccines °· mitoxantrone °· natalizumab °· peginterferon beta °· rituximab °· steroid medicines like prednisone or cortisone °· teriflunomide °This list may not describe all possible interactions. Give your health care provider a list of all the medicines, herbs,  non-prescription drugs, or dietary supplements you use. Also tell them if you smoke, drink alcohol, or use illegal drugs. Some items may interact with your medicine. °What should I watch for while using this medicine? °Tell your doctor or healthcare professional if your symptoms do not start to get better or if they get worse. °This medicine can cause serious allergic reactions. To reduce your risk you may need to take medicine before treatment with this medicine. Take your medicine as directed. °Women should inform their doctor if they wish to become pregnant or think they might be pregnant. There is a potential for serious side effects to an unborn child. Talk to your health care professional or pharmacist for more information. Female patients should use effective birth control methods while receiving this medicine and for 6 months after the last dose. °Call your doctor or health care professional for advice if you get a fever, chills or sore throat, or other symptoms of a cold or flu. Do not treat yourself. This drug decreases your body's ability to fight infections. Try to avoid being around people who are sick. °If you have a hepatitis B infection or a history of a hepatitis B infection, talk to your doctor. The symptoms of hepatitis B may get worse if you take this medicine. °In some patients, this medicine may cause a serious brain infection that may cause death. If you have any problems seeing, thinking, speaking, walking, or standing, tell your doctor right away. If you cannot reach your doctor, urgently seek other source of medical care. °This medicine can decrease the response to a vaccine. If you need to get   vaccinated, tell your healthcare professional if you have received this medicine. Extra booster doses may be needed. Talk to your doctor to see if a different vaccination schedule is needed. °Talk to your doctor about your risk of cancer. You may be more at risk for certain types of cancers if you  take this medicine. °What side effects may I notice from receiving this medicine? °Side effects that you should report to your doctor or health care professional as soon as possible: °· allergic reactions like skin rash, itching or hives, swelling of the face, lips, or tongue °· breathing problems °· facial flushing °· fast, irregular heartbeat °· lump or soreness in the breast °· signs and symptoms of herpes such as cold sore, shingles, or genital sores °· signs and symptoms of infection like fever or chills, cough, sore throat, pain or trouble passing urine °· signs and symptoms of low blood pressure like dizziness; feeling faint or lightheaded, falls; unusually weak or tired °· signs and symptoms of progressive multifocal leukoencephalopathy (PML) like changes in vision; clumsiness; confusion; personality changes; weakness on one side of the body °· swelling of the ankles, feet, hands °Side effects that usually do not require medical attention (report these to your doctor or health care professional if they continue or are bothersome): °· back pain °· depressed mood °· diarrhea °· pain, redness, or irritation at site where injected °This list may not describe all possible side effects. Call your doctor for medical advice about side effects. You may report side effects to FDA at 1-800-FDA-1088. °Where should I keep my medicine? °This drug is given in a hospital or clinic and will not be stored at home. °NOTE: This sheet is a summary. It may not cover all possible information. If you have questions about this medicine, talk to your doctor, pharmacist, or health care provider. °© 2020 Elsevier/Gold Standard (2018-07-15 07:41:53) ° °

## 2019-12-10 NOTE — Telephone Encounter (Signed)
Called, LVM for pt to call office Tried calling Mason Jim (grandmother on [place]) at [phone]. LVM for herself or pt to call office.

## 2019-12-11 ENCOUNTER — Other Ambulatory Visit: Payer: Self-pay | Admitting: *Deleted

## 2019-12-11 MED ORDER — DALFAMPRIDINE ER 10 MG PO TB12: ORAL_TABLET | ORAL | 1 refills | Status: DC

## 2019-12-11 NOTE — Telephone Encounter (Signed)
[facility] back and advised pt should continue on medication. They were out of refills, I e-scribed.

## 2019-12-24 ENCOUNTER — Other Ambulatory Visit: Payer: Self-pay

## 2019-12-24 ENCOUNTER — Ambulatory Visit (HOSPITAL_COMMUNITY)
Admission: RE | Admit: 2019-12-24 | Discharge: 2019-12-24 | Disposition: A | Discharge status: Home / Self Care | Payer: Medicare Other | Source: Ambulatory Visit | Attending: Internal Medicine | Admitting: Internal Medicine

## 2019-12-24 DIAGNOSIS — G35 Multiple sclerosis: Secondary | ICD-10-CM | POA: Insufficient documentation

## 2019-12-24 MED ORDER — METHYLPREDNISOLONE SODIUM SUCC 125 MG IJ SOLR
125.0000 mg | Freq: Once | INTRAMUSCULAR | Status: AC
  Administered 2019-12-24: 125 mg via INTRAVENOUS
  Filled 2019-12-24: qty 2

## 2019-12-24 MED ORDER — DIPHENHYDRAMINE HCL 25 MG PO CAPS
25.0000 mg | ORAL_CAPSULE | Freq: Once | ORAL | Status: AC
  Administered 2019-12-24: 25 mg via ORAL
  Filled 2019-12-24: qty 1

## 2019-12-24 MED ORDER — ACETAMINOPHEN 325 MG PO TABS
650.0000 mg | ORAL_TABLET | Freq: Once | ORAL | Status: AC
  Administered 2019-12-24: 650 mg via ORAL
  Filled 2019-12-24: qty 2

## 2019-12-24 MED ORDER — SODIUM CHLORIDE 0.9 % IV SOLN
INTRAVENOUS | Status: DC | PRN
  Administered 2019-12-24: 250 mL via INTRAVENOUS

## 2019-12-24 MED ORDER — SODIUM CHLORIDE 0.9 % IV SOLN
300.0000 mg | Freq: Once | INTRAVENOUS | Status: AC
  Administered 2019-12-24: 300 mg via INTRAVENOUS
  Filled 2019-12-24: qty 10

## 2019-12-24 NOTE — Discharge Instructions (Signed)
Ocrelizumab injection °What is this medicine? °OCRELIZUMAB (ok re LIZ ue mab) treats multiple sclerosis. It helps to decrease the number of multiple sclerosis relapses. It is not a cure. °This medicine may be used for other purposes; ask your health care provider or pharmacist if you have questions. °COMMON BRAND NAME(S): OCREVUS °What should I tell my health care provider before I take this medicine? °They need to know if you have any of these conditions: °· cancer °· hepatitis B infection °· other infection (especially a virus infection such as chickenpox, cold sores, or herpes) °· an unusual or allergic reaction to ocrelizumab, other medicines, foods, dyes or preservatives °· pregnant or trying to get pregnant °· breast-feeding °How should I use this medicine? °This medicine is for infusion into a vein. It is given by a health care professional in a hospital or clinic setting. °A special MedGuide will be given to you before each treatment. Be sure to read this information carefully each time. °Talk to your pediatrician regarding the use of this medicine in children. Special care may be needed. °Overdosage: If you think you have taken too much of this medicine contact a poison control center or emergency room at once. °NOTE: This medicine is only for you. Do not share this medicine with others. °What if I miss a dose? °Keep appointments for follow-up doses as directed. It is important not to miss your dose. Call your doctor or health care professional if you are unable to keep an appointment. °What may interact with this medicine? °· alemtuzumab °· daclizumab °· dimethyl fumarate °· fingolimod °· glatiramer °· interferon beta °· live virus vaccines °· mitoxantrone °· natalizumab °· peginterferon beta °· rituximab °· steroid medicines like prednisone or cortisone °· teriflunomide °This list may not describe all possible interactions. Give your health care provider a list of all the medicines, herbs,  non-prescription drugs, or dietary supplements you use. Also tell them if you smoke, drink alcohol, or use illegal drugs. Some items may interact with your medicine. °What should I watch for while using this medicine? °Tell your doctor or healthcare professional if your symptoms do not start to get better or if they get worse. °This medicine can cause serious allergic reactions. To reduce your risk you may need to take medicine before treatment with this medicine. Take your medicine as directed. °Women should inform their doctor if they wish to become pregnant or think they might be pregnant. There is a potential for serious side effects to an unborn child. Talk to your health care professional or pharmacist for more information. Female patients should use effective birth control methods while receiving this medicine and for 6 months after the last dose. °Call your doctor or health care professional for advice if you get a fever, chills or sore throat, or other symptoms of a cold or flu. Do not treat yourself. This drug decreases your body's ability to fight infections. Try to avoid being around people who are sick. °If you have a hepatitis B infection or a history of a hepatitis B infection, talk to your doctor. The symptoms of hepatitis B may get worse if you take this medicine. °In some patients, this medicine may cause a serious brain infection that may cause death. If you have any problems seeing, thinking, speaking, walking, or standing, tell your doctor right away. If you cannot reach your doctor, urgently seek other source of medical care. °This medicine can decrease the response to a vaccine. If you need to get   vaccinated, tell your healthcare professional if you have received this medicine. Extra booster doses may be needed. Talk to your doctor to see if a different vaccination schedule is needed. °Talk to your doctor about your risk of cancer. You may be more at risk for certain types of cancers if you  take this medicine. °What side effects may I notice from receiving this medicine? °Side effects that you should report to your doctor or health care professional as soon as possible: °· allergic reactions like skin rash, itching or hives, swelling of the face, lips, or tongue °· breathing problems °· facial flushing °· fast, irregular heartbeat °· lump or soreness in the breast °· signs and symptoms of herpes such as cold sore, shingles, or genital sores °· signs and symptoms of infection like fever or chills, cough, sore throat, pain or trouble passing urine °· signs and symptoms of low blood pressure like dizziness; feeling faint or lightheaded, falls; unusually weak or tired °· signs and symptoms of progressive multifocal leukoencephalopathy (PML) like changes in vision; clumsiness; confusion; personality changes; weakness on one side of the body °· swelling of the ankles, feet, hands °Side effects that usually do not require medical attention (report these to your doctor or health care professional if they continue or are bothersome): °· back pain °· depressed mood °· diarrhea °· pain, redness, or irritation at site where injected °This list may not describe all possible side effects. Call your doctor for medical advice about side effects. You may report side effects to FDA at 1-800-FDA-1088. °Where should I keep my medicine? °This drug is given in a hospital or clinic and will not be stored at home. °NOTE: This sheet is a summary. It may not cover all possible information. If you have questions about this medicine, talk to your doctor, pharmacist, or health care provider. °© 2020 Elsevier/Gold Standard (2018-07-15 07:41:53) ° °

## 2019-12-24 NOTE — Progress Notes (Signed)
Patient received 300 mg of IV Ocrevus. Pre medications - PO Tylenol, PO Benadryl, IV Solu-medrol were given per order by Despina Arias MD. Patient declined the one hour post infusion observation. Tolerated well, vitals stable, discharge instructions given, verbalized understanding. Patient alert, oriented and transported in a wheelchair at the time of discharge.

## 2020-01-28 ENCOUNTER — Other Ambulatory Visit: Payer: Self-pay | Admitting: Neurology

## 2020-03-22 ENCOUNTER — Ambulatory Visit: Payer: Self-pay | Admitting: Nurse Practitioner

## 2020-03-30 ENCOUNTER — Other Ambulatory Visit: Payer: Self-pay | Admitting: *Deleted

## 2020-03-30 MED ORDER — PHENTERMINE HCL 37.5 MG PO CAPS
37.5000 mg | ORAL_CAPSULE | ORAL | 5 refills | Status: DC
Start: 2020-03-30 — End: 2020-09-29

## 2020-04-22 ENCOUNTER — Ambulatory Visit: Payer: Medicare Other | Admitting: Neurology

## 2020-04-26 ENCOUNTER — Ambulatory Visit: Payer: Medicare Other | Admitting: Neurology

## 2020-05-02 ENCOUNTER — Other Ambulatory Visit: Payer: Self-pay | Admitting: Family Medicine

## 2020-05-02 DIAGNOSIS — I10 Essential (primary) hypertension: Secondary | ICD-10-CM

## 2020-05-31 ENCOUNTER — Other Ambulatory Visit: Payer: Self-pay | Admitting: Neurology

## 2020-06-07 ENCOUNTER — Telehealth: Payer: Self-pay | Admitting: Neurology

## 2020-06-07 MED ORDER — BACLOFEN 10 MG PO TABS: ORAL_TABLET | ORAL | 3 refills | Status: DC

## 2020-06-07 NOTE — Telephone Encounter (Addendum)
Reviewed pt chart. Baclofen last sent to [facility] 09/11/[facility] with 3 refills (this was a years supply).  Called [facility] at [phone]. Spoke with Hessie Diener. Pt refilled on 09/11/19, 11/18/19, 01/25/20, 04/02/20. She was refilling early.  Will speak with MD to see how he would like to proceed.

## 2020-06-07 NOTE — Telephone Encounter (Signed)
We can have her go up to 8 a day 10 mg baclofen #720   #3    please let her know not to take > 8/day so should last 90 days each

## 2020-06-07 NOTE — Telephone Encounter (Signed)
Pt request refill baclofen (LIORESAL) 10 MG tablet at [facility] [phone] -

## 2020-06-07 NOTE — Telephone Encounter (Signed)
Called and LVM for pt relaying Dr. Bonnita Hollow message. E-scribed updated script to [facility].

## 2020-06-14 ENCOUNTER — Other Ambulatory Visit: Payer: Self-pay | Admitting: Neurology

## 2020-06-22 ENCOUNTER — Telehealth: Payer: Self-pay | Admitting: *Deleted

## 2020-06-22 ENCOUNTER — Ambulatory Visit (INDEPENDENT_AMBULATORY_CARE_PROVIDER_SITE_OTHER): Payer: Medicare Other | Admitting: Neurology

## 2020-06-22 ENCOUNTER — Encounter: Payer: Self-pay | Admitting: Neurology

## 2020-06-22 VITALS — BP 153/101 | HR 86 | Ht 62.0 in

## 2020-06-22 DIAGNOSIS — R29898 Other symptoms and signs involving the musculoskeletal system: Secondary | ICD-10-CM | POA: Diagnosis not present

## 2020-06-22 DIAGNOSIS — G8114 Spastic hemiplegia affecting left nondominant side: Secondary | ICD-10-CM

## 2020-06-22 DIAGNOSIS — R3915 Urgency of urination: Secondary | ICD-10-CM

## 2020-06-22 DIAGNOSIS — Z298 Encounter for other specified prophylactic measures: Secondary | ICD-10-CM | POA: Diagnosis not present

## 2020-06-22 DIAGNOSIS — G35 Multiple sclerosis: Secondary | ICD-10-CM | POA: Diagnosis not present

## 2020-06-22 MED ORDER — TIZANIDINE HCL 4 MG PO TABS: ORAL_TABLET | ORAL | 3 refills | Status: DC

## 2020-06-22 NOTE — Telephone Encounter (Signed)
Called pt not able to reach pt.

## 2020-06-22 NOTE — Telephone Encounter (Signed)
Faxed updated Ocrevus order to [facility] at [phone]. Received fax confirmation.

## 2020-06-22 NOTE — Progress Notes (Signed)
[facility]  PATIENT: Lindsay Schneider DOB: [birth year]    _________________________________   HISTORICAL  CHIEF COMPLAINT:  Chief Complaint  Patient presents with  . Follow-up    RM 12. Last seen 10/22/19. Having increased tightness in feet/legs. Started last month. Denies any falls.   . Multiple Sclerosis    On Ocrevus. Last: 12/24/19. Next: 06/24/20. Receives at [facility].   . Gait Problem    In WC in office today.     HISTORY OF PRESENT ILLNESS:  Lindsay Schneider is a 34 y.o. woman who was diagnosed with MS in 2011 after presenting with left sided arm and leg numbness and weakness.    She has an active/relapsing secondary progressive MS.    Update 06/22/2020: She switched to Ocrevus from Tysabri afte the last visit after she converted to JCV Ab positive at the end of 2020 (high positive at 3.89).   She opted to switch to Ocrevus.  Her first infusion was May and June 2021   Her next infusion is 06/24/2020.   She tolerated it well.     She notres that she is always feeling cold.    She is feeling a little weaker in her legs.    Her left side has always been more involved than her with and she has weakness with spasticity.   The right leg is also weak.  The right arm is strong but has reduced coordination.  Handwriting is worse.   She has spasticity that is most troublesome in there left arm.  She feels the spasticity has worsened over the last year.  She is on baclofen 20 mg po qid for the spasticity.   She is wheelchair bound and she needs help to transfer.     She has intermittent numbness on the left side.    She notes more urinary urgency and rare  incontinence.    She has reduced VA and color vision OS.     She does not want to get the Covid 19 vaccination.   We discussed that I recommend that she gets the vaccination.    She needs a new lightweight wheelchair.   MS History:   In 2011, onset of left arm and leg numbness and weakness.  Her  symptoms develop over 1 day. She had an MRI performed with lesions consistent with MS. She started to see Dr. Terrace Arabia here at [facility]. She was placed on Rebif. However, she stopped because her legs felt weaker when she was on it. She then transferred her care to Dr. Renne Crigler at [facility].     She was started on Tysabri.  She had several infusions of Tysabri but transportation was difficult to get to [facility]. Therefore, Dr. Renne Crigler switched her to Rituxan although she tolerated the medication well. She had a significant relapse with bilateral leg weakness within a couple weeks of the infusion. Therefore, 6 months later, she started Tysabri again. Due to difficulties with transportation, she transferred care to [place] in October 2016.  She converted to JCV Ab positive early 2021   Imaging: MRI of the brain 06/25/2015 shows a couple small T2/FLAIR hyperintense foci in the cerebellar hemispheres and other foci in the left middle cerebellar peduncle and pons.. The deep gray matter appears normal. In the hemispheres, there are are multiple single and confluent T2/FLAIR hyperintense foci, predominantly in the periventricular white matter. Many of these foci are radially oriented to the ventricles.   MRI of the cervical spine 06/23/2015  shows multiple T2 hyperintense foci within the spinal cord. They are located posteriorly to the left adjacent to C2, posteriorly adjacent to C3, posterior to the right adjacent to C4, posterior to the right adjacent to C5, posterior to the right and central adjacent to C6.   None of these foci enhanced after contrast administration.  REVIEW OF SYSTEMS: Constitutional: No fevers, chills, sweats, or change in appetite.   She has fatigue and poor sleep.    Eyes: No visual changes, double vision, eye pain Ear, nose and throat: No hearing loss, ear pain, nasal congestion, sore throat Cardiovascular: No chest pain, palpitations Respiratory: No shortness of breath at rest or with exertion.   No  wheezes GastrointestinaI: No nausea, vomiting, diarrhea, abdominal pain, fecal incontinence Genitourinary: Shehas urinary urgency and frequency with occ incontinence.    Hasnocturia. Musculoskeletal: No neck pain, back pain Integumentary: No rash, pruritus, skin lesions Neurological: as above Psychiatric:   Some depression at this time.  No anxiety Endocrine: No palpitations, diaphoresis, change in appetite, change in weigh or increased thirst Hematologic/Lymphatic: No anemia, purpura, petechiae. Allergic/Immunologic: No itchy/runny eyes, nasal congestion, recent allergic reactions, rashes  ALLERGIES: No Known Allergies  HOME MEDICATIONS:  Current Outpatient Medications:  .  baclofen (LIORESAL) 10 MG tablet, TAKE 1 TO 2 TABLETS 4 TIMES A DAY, Disp: 720 tablet, Rfl: 3 .  dalfampridine 10 MG TB12, TAKE 1 TABLET BY MOUTH 2 TIMES A DAY (APPROXIMATELY 12 HOURS APART), Disp: 180 tablet, Rfl: 0 .  hydrochlorothiazide (HYDRODIURIL) 12.5 MG tablet, Take 1 tablet (12.5 mg total) by mouth daily., Disp: 90 tablet, Rfl: 3 .  hydrOXYzine (ATARAX/VISTARIL) 25 MG tablet, Take 25 mg by mouth at bedtime. , Disp: , Rfl: 0 .  LORazepam (ATIVAN) 1 MG tablet, One po qHS prn, Disp: 30 tablet, Rfl: 5 .  oxybutynin (DITROPAN) 5 MG tablet, Take 1 tablet (5 mg total) by mouth 2 (two) times daily., Disp: 60 tablet, Rfl: 11 .  phentermine (ADIPEX-P) 37.5 MG tablet, TAKE 1 TAB BY MOUTH EVERY DAY IN THE MORNING., Disp: 30 tablet, Rfl: 5 .  phentermine 37.5 MG capsule, Take 1 capsule (37.5 mg total) by mouth every morning., Disp: 30 capsule, Rfl: 5 .  triamcinolone (KENALOG) 0.025 % ointment, Apply 1 application topically 2 (two) times daily as needed (rash). , Disp: , Rfl: 0 .  Vitamin D, Ergocalciferol, (DRISDOL) 1.25 MG (50000 UT) CAPS capsule, TAKE 1 CAPSULE (50,000 UNITS TOTAL) BY MOUTH EVERY 7 (SEVEN) DAYS., Disp: 13 capsule, Rfl: 3 .  tiZANidine (ZANAFLEX) 4 MG tablet, 1/2 to 1 pill po tid, Disp: 270 tablet,  Rfl: 3  PAST MEDICAL HISTORY: Past Medical History:  Diagnosis Date  . Movement disorder   . MS (multiple sclerosis) (HCC)     PAST SURGICAL HISTORY: Past Surgical History:  Procedure Laterality Date  . CESAREAN SECTION      FAMILY HISTORY: Family History  Problem Relation Age of Onset  . Asthma Mother   . Hypertension Father   . Healthy Sister   . Healthy Brother     SOCIAL HISTORY:  Social History   Socioeconomic History  . Marital status: Single    Spouse name: Not on file  . Number of children: Not on file  . Years of education: Not on file  . Highest education level: Not on file  Occupational History  . Not on file  Tobacco Use  . Smoking status: Never Smoker  . Smokeless tobacco: Never Used  Vaping Use  .  Vaping Use: Never used  Substance and Sexual Activity  . Alcohol use: No  . Drug use: No  . Sexual activity: Not Currently    Birth control/protection: Injection  Other Topics Concern  . Not on file  Social History Narrative  . Not on file   Social Determinants of Health   Financial Resource Strain:   . Difficulty of Paying Living Expenses: Not on file  Food Insecurity:   . Worried About Programme researcher, broadcasting/film/video in the Last Year: Not on file  . Ran Out of Food in the Last Year: Not on file  Transportation Needs:   . Lack of Transportation (Medical): Not on file  . Lack of Transportation (Non-Medical): Not on file  Physical Activity:   . Days of Exercise per Week: Not on file  . Minutes of Exercise per Session: Not on file  Stress:   . Feeling of Stress : Not on file  Social Connections:   . Frequency of Communication with Friends and Family: Not on file  . Frequency of Social Gatherings with Friends and Family: Not on file  . Attends Religious Services: Not on file  . Active Member of Clubs or Organizations: Not on file  . Attends Banker Meetings: Not on file  . Marital Status: Not on file  Intimate Partner Violence:   . Fear  of Current or Ex-Partner: Not on file  . Emotionally Abused: Not on file  . Physically Abused: Not on file  . Sexually Abused: Not on file     PHYSICAL EXAM  Vitals:   06/22/20 1526  BP: (!) 153/101  Pulse: 86  Height: 5\' 2"  (1.575 m)    Body mass index is 26.33 kg/m.   General: The patient is well-developed and well-nourished and in no acute distress  Skin:   She has a hyperpigmented rash on the arms, legs and trunk.  Neurologic Exam  Mental status: The patient is alert and oriented x 3 at the time of the examination. The patient has apparent normal recent and remote memory, with an apparently normal attention span and concentration ability.   Speech is normal.  Cranial nerves: Extraocular movements are full.. Facial strength and sensation is normal. Trapezius strength is normal.  The tongue is midline, and the patient has symmetric elevation of the soft palate. No obvious hearing deficits are noted.  Motor: Muscle bulk is normal.  Muscle tone is greatly increased in the left arm and both legs, left greater than right.   Right arm has good tone/strength but reduced RAM.  Strength is 3 to 4-/5 in the left distal arm and 2+ in the proximal arm. Strength is 2  in proximal right and left leg and 2+ in right knee extension and 2 in left knee extension , 2- right and  1 left ankle extensors,    Sensory: Intact sensation to touch and vibration in the arms.  She has reduced vibration sensation in the left leg.  Coordination: She has slightly reduced finger-nose-finger on the right.  She is unable to do on the left.  She is too weak for heel-to-shin.  Gait and station: She cannot stand or walk.  Reflexes: Deep tendon reflexes are increased in arms, left > right and in legs with spread att knees and sustained clonus at the ankles.       DIAGNOSTIC DATA (LABS, IMAGING, TESTING) - I reviewed patient records, labs, notes, testing and imaging myself where available.  Lab Results  Component Value Date   WBC 9.4 10/22/2019   HGB 12.5 10/22/2019   HCT 38.8 10/22/2019   MCV 85 10/22/2019   PLT 240 10/22/2019      Component Value Date/Time   NA 139 05/21/2019 1057   K 3.9 05/21/2019 1057   CL 104 05/21/2019 1057   CO2 24 05/21/2019 1057   GLUCOSE 80 05/21/2019 1057   GLUCOSE 119 (H) 07/13/2018 0534   BUN 4 (L) 05/21/2019 1057   CREATININE 0.56 (L) 05/21/2019 1057   CALCIUM 9.4 05/21/2019 1057   PROT 6.5 10/22/2019 1137   ALBUMIN 4.2 10/22/2019 1137   AST 13 10/22/2019 1137   ALT 14 10/22/2019 1137   ALKPHOS 92 10/22/2019 1137   BILITOT 0.4 10/22/2019 1137   GFRNONAA 125 05/21/2019 1057   GFRAA 144 05/21/2019 1057       ASSESSMENT AND PLAN  Multiple sclerosis (HCC) - Plan: CBC with Differential/Platelet, IgG, IgA, IgM, CD19 and CD20, Flow Cytometry  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    Continue Ocrevus.  Her next infusion will be in the next week.  We will check blood work today.  2.   Continue baclofen up to 80 mg a day and continue lorazepam nightly to help with her insomnia and nighttime spasticity.. Since she is experiencing more spasticity I will add tizanidine and titrate up to 4 mg 3 times daily and further if tolerated 3.    Continue Phentermine for fatigue, attention/focusweight loss.    4.    Prescription for lightweight wheelchair.   5.    Return in 6 months or sooner if there are new or worsening neurologic symptoms.  45-minute office visit with the majority of the time spent face-to-face for history and physical, discussion/counseling and decision-making.  Additional time with record review and documentation.   Gisel Vipond A. Epimenio Foot, MD, PhD 06/22/2020, 5:30 PM Certified in Neurology, Clinical Neurophysiology, Sleep Medicine,  Pain Medicine and Neuroimaging  [facility] [address], [address] [place], [place] [address] [phone]

## 2020-06-24 ENCOUNTER — Other Ambulatory Visit: Payer: Self-pay

## 2020-06-24 ENCOUNTER — Ambulatory Visit (HOSPITAL_COMMUNITY)
Admission: RE | Admit: 2020-06-24 | Discharge: 2020-06-24 | Disposition: A | Discharge status: Home / Self Care | Payer: Medicare Other | Source: Ambulatory Visit | Attending: Internal Medicine | Admitting: Internal Medicine

## 2020-06-24 DIAGNOSIS — G35 Multiple sclerosis: Secondary | ICD-10-CM | POA: Diagnosis not present

## 2020-06-24 MED ORDER — DIPHENHYDRAMINE HCL 25 MG PO CAPS
25.0000 mg | ORAL_CAPSULE | Freq: Once | ORAL | Status: AC
  Administered 2020-06-24: 25 mg via ORAL
  Filled 2020-06-24: qty 1

## 2020-06-24 MED ORDER — SODIUM CHLORIDE 0.9 % IV SOLN
600.0000 mg | INTRAVENOUS | Status: DC
  Administered 2020-06-24: 600 mg via INTRAVENOUS
  Filled 2020-06-24: qty 20

## 2020-06-24 MED ORDER — FAMOTIDINE IN NACL 20-0.9 MG/50ML-% IV SOLN
20.0000 mg | Freq: Once | INTRAVENOUS | Status: AC
  Administered 2020-06-24: 20 mg via INTRAVENOUS
  Filled 2020-06-24: qty 50

## 2020-06-24 MED ORDER — METHYLPREDNISOLONE SODIUM SUCC 125 MG IJ SOLR
125.0000 mg | Freq: Once | INTRAMUSCULAR | Status: AC
  Administered 2020-06-24: 125 mg via INTRAVENOUS
  Filled 2020-06-24: qty 2

## 2020-06-24 MED ORDER — ACETAMINOPHEN 325 MG PO TABS
650.0000 mg | ORAL_TABLET | Freq: Once | ORAL | Status: AC
  Administered 2020-06-24: 650 mg via ORAL
  Filled 2020-06-24: qty 2

## 2020-06-24 MED ORDER — SODIUM CHLORIDE 0.9 % IV SOLN
INTRAVENOUS | Status: DC | PRN
  Administered 2020-06-24: 250 mL via INTRAVENOUS

## 2020-06-24 NOTE — Progress Notes (Signed)
[facility]ARE [facility] NOTE  Diagnosis: Multiple Sclerosis    Provider: Despina Arias, MD   Procedure: Ocrevus 600 mg infusion    Note: Patient received Ocrevus 600 mg infusion via PIV. Pre-medications given per order. Infusion titrated per protocol. Patient tolerated infusion well with no adverse reaction. Vital signs remained stable. Patient declined the 1 hour observation post-infusion. Patient advised to notify provider or report to the ED if any severe reaction occurred. Discharge instructions given. Patient to come back in 6 months for next infusion. Alert, oriented and transported in wheelchair at discharge.

## 2020-06-24 NOTE — Discharge Instructions (Signed)
Ocrelizumab injection °What is this medicine? °OCRELIZUMAB (ok re LIZ ue mab) treats multiple sclerosis. It helps to decrease the number of multiple sclerosis relapses. It is not a cure. °This medicine may be used for other purposes; ask your health care provider or pharmacist if you have questions. °COMMON BRAND NAME(S): OCREVUS °What should I tell my health care provider before I take this medicine? °They need to know if you have any of these conditions: °· cancer °· hepatitis B infection °· other infection (especially a virus infection such as chickenpox, cold sores, or herpes) °· an unusual or allergic reaction to ocrelizumab, other medicines, foods, dyes or preservatives °· pregnant or trying to get pregnant °· breast-feeding °How should I use this medicine? °This medicine is for infusion into a vein. It is given by a health care professional in a hospital or clinic setting. °A special MedGuide will be given to you before each treatment. Be sure to read this information carefully each time. °Talk to your pediatrician regarding the use of this medicine in children. Special care may be needed. °Overdosage: If you think you have taken too much of this medicine contact a poison control center or emergency room at once. °NOTE: This medicine is only for you. Do not share this medicine with others. °What if I miss a dose? °Keep appointments for follow-up doses as directed. It is important not to miss your dose. Call your doctor or health care professional if you are unable to keep an appointment. °What may interact with this medicine? °· alemtuzumab °· daclizumab °· dimethyl fumarate °· fingolimod °· glatiramer °· interferon beta °· live virus vaccines °· mitoxantrone °· natalizumab °· peginterferon beta °· rituximab °· steroid medicines like prednisone or cortisone °· teriflunomide °This list may not describe all possible interactions. Give your health care provider a list of all the medicines, herbs,  non-prescription drugs, or dietary supplements you use. Also tell them if you smoke, drink alcohol, or use illegal drugs. Some items may interact with your medicine. °What should I watch for while using this medicine? °Tell your doctor or healthcare professional if your symptoms do not start to get better or if they get worse. °This medicine can cause serious allergic reactions. To reduce your risk you may need to take medicine before treatment with this medicine. Take your medicine as directed. °Women should inform their doctor if they wish to become pregnant or think they might be pregnant. There is a potential for serious side effects to an unborn child. Talk to your health care professional or pharmacist for more information. Female patients should use effective birth control methods while receiving this medicine and for 6 months after the last dose. °Call your doctor or health care professional for advice if you get a fever, chills or sore throat, or other symptoms of a cold or flu. Do not treat yourself. This drug decreases your body's ability to fight infections. Try to avoid being around people who are sick. °If you have a hepatitis B infection or a history of a hepatitis B infection, talk to your doctor. The symptoms of hepatitis B may get worse if you take this medicine. °In some patients, this medicine may cause a serious brain infection that may cause death. If you have any problems seeing, thinking, speaking, walking, or standing, tell your doctor right away. If you cannot reach your doctor, urgently seek other source of medical care. °This medicine can decrease the response to a vaccine. If you need to get   vaccinated, tell your healthcare professional if you have received this medicine. Extra booster doses may be needed. Talk to your doctor to see if a different vaccination schedule is needed. °Talk to your doctor about your risk of cancer. You may be more at risk for certain types of cancers if you  take this medicine. °What side effects may I notice from receiving this medicine? °Side effects that you should report to your doctor or health care professional as soon as possible: °· allergic reactions like skin rash, itching or hives, swelling of the face, lips, or tongue °· breathing problems °· facial flushing °· fast, irregular heartbeat °· lump or soreness in the breast °· signs and symptoms of herpes such as cold sore, shingles, or genital sores °· signs and symptoms of infection like fever or chills, cough, sore throat, pain or trouble passing urine °· signs and symptoms of low blood pressure like dizziness; feeling faint or lightheaded, falls; unusually weak or tired °· signs and symptoms of progressive multifocal leukoencephalopathy (PML) like changes in vision; clumsiness; confusion; personality changes; weakness on one side of the body °· swelling of the ankles, feet, hands °Side effects that usually do not require medical attention (report these to your doctor or health care professional if they continue or are bothersome): °· back pain °· depressed mood °· diarrhea °· pain, redness, or irritation at site where injected °This list may not describe all possible side effects. Call your doctor for medical advice about side effects. You may report side effects to FDA at 1-800-FDA-1088. °Where should I keep my medicine? °This drug is given in a hospital or clinic and will not be stored at home. °NOTE: This sheet is a summary. It may not cover all possible information. If you have questions about this medicine, talk to your doctor, pharmacist, or health care provider. °© 2020 Elsevier/Gold Standard (2018-07-15 07:41:53) ° °

## 2020-06-25 LAB — CBC WITH DIFFERENTIAL/PLATELET
Basophils Absolute: 0 10*3/uL (ref 0.0–0.2)
Basos: 1 %
EOS (ABSOLUTE): 0.7 10*3/uL — ABNORMAL HIGH (ref 0.0–0.4)
Eos: 8 %
Hematocrit: 41.1 % (ref 34.0–46.6)
Hemoglobin: 13.6 g/dL (ref 11.1–15.9)
Immature Grans (Abs): 0 10*3/uL (ref 0.0–0.1)
Immature Granulocytes: 0 %
Lymphocytes Absolute: 1.5 10*3/uL (ref 0.7–3.1)
Lymphs: 19 %
MCH: 27.4 pg (ref 26.6–33.0)
MCHC: 33.1 g/dL (ref 31.5–35.7)
MCV: 83 fL (ref 79–97)
Monocytes Absolute: 0.6 10*3/uL (ref 0.1–0.9)
Monocytes: 7 %
Neutrophils Absolute: 5.4 10*3/uL (ref 1.4–7.0)
Neutrophils: 65 %
Platelets: 272 10*3/uL (ref 150–450)
RBC: 4.97 x10E6/uL (ref 3.77–5.28)
RDW: 14.8 % (ref 11.7–15.4)
WBC: 8.2 10*3/uL (ref 3.4–10.8)

## 2020-06-25 LAB — CD19 AND CD20, FLOW CYTOMETRY

## 2020-06-25 LAB — IGG, IGA, IGM
IgA/Immunoglobulin A, Serum: 340 mg/dL (ref 87–352)
IgG (Immunoglobin G), Serum: 1255 mg/dL (ref 586–1602)
IgM (Immunoglobulin M), Srm: 52 mg/dL (ref 26–217)

## 2020-06-28 ENCOUNTER — Telehealth: Payer: Self-pay | Admitting: *Deleted

## 2020-06-28 ENCOUNTER — Telehealth: Payer: Self-pay | Admitting: Neurology

## 2020-06-28 DIAGNOSIS — G35 Multiple sclerosis: Secondary | ICD-10-CM

## 2020-06-28 DIAGNOSIS — R269 Unspecified abnormalities of gait and mobility: Secondary | ICD-10-CM

## 2020-06-28 DIAGNOSIS — G8114 Spastic hemiplegia affecting left nondominant side: Secondary | ICD-10-CM

## 2020-06-28 NOTE — Telephone Encounter (Signed)
-----   Message from Asa Lente, MD sent at 06/26/2020 11:15 AM EST ----- Please let the patient know that the lab work is fine.

## 2020-06-28 NOTE — Telephone Encounter (Addendum)
Called pt and relayed lab results per [name] note. Pt verbalized understanding. She is requesting MRI's to be done, has been awhile since her last ones. She also is requesting PT/OT at [facility]/neuro-rehab for her hand. Advised I will ask [name] and call her back.  Per MD, he approved above requests. Placed MRI brain w/o and MRI cervical w/wo. Pt ok to have them done at [facility] imaging. Also placed referral for PT/OT. I called pt and updated her. She verbalized understanding and appreciation.

## 2020-06-28 NOTE — Telephone Encounter (Signed)
Medicare/medicaid order sent to GI. No auth they will reach out to the patient to schedule.  °

## 2020-07-12 ENCOUNTER — Telehealth: Payer: Self-pay | Admitting: Neurology

## 2020-07-12 NOTE — Telephone Encounter (Signed)
Pt. called & asked that her discharge application be faxed to [phone].

## 2020-07-12 NOTE — Telephone Encounter (Signed)
Called, LVM for pt to call office back. Unsure what she is needing.

## 2020-07-13 NOTE — Telephone Encounter (Signed)
Called pt again. Confirmed we received disability forms. We have 7-14 business days to fill out. I will have Stanton Kidney in medical records call her once they are completed. She would like forms faxed once done.

## 2020-07-14 NOTE — Telephone Encounter (Signed)
Reviewed and signed by [name]. Sent to medical records.

## 2020-08-05 ENCOUNTER — Other Ambulatory Visit: Payer: Self-pay | Admitting: Neurology

## 2020-08-23 ENCOUNTER — Ambulatory Visit: Payer: Medicare Other | Attending: Neurology

## 2020-08-23 ENCOUNTER — Ambulatory Visit: Payer: Medicare Other | Admitting: Occupational Therapy

## 2020-09-10 ENCOUNTER — Ambulatory Visit: Payer: Medicare Other | Attending: Neurology

## 2020-09-10 ENCOUNTER — Ambulatory Visit: Payer: Medicare Other | Admitting: Occupational Therapy

## 2020-09-29 ENCOUNTER — Other Ambulatory Visit: Payer: Self-pay | Admitting: Neurology

## 2020-11-17 ENCOUNTER — Other Ambulatory Visit: Payer: Self-pay | Admitting: *Deleted

## 2020-11-17 ENCOUNTER — Other Ambulatory Visit: Payer: Self-pay | Admitting: Neurology

## 2020-11-17 DIAGNOSIS — R269 Unspecified abnormalities of gait and mobility: Secondary | ICD-10-CM

## 2020-11-17 DIAGNOSIS — G35 Multiple sclerosis: Secondary | ICD-10-CM

## 2020-11-17 MED ORDER — DALFAMPRIDINE ER 10 MG PO TB12: ORAL_TABLET | ORAL | 0 refills | Status: DC

## 2020-12-22 ENCOUNTER — Other Ambulatory Visit: Payer: Self-pay

## 2020-12-23 ENCOUNTER — Ambulatory Visit: Payer: Medicare Other | Admitting: Neurology

## 2020-12-23 ENCOUNTER — Telehealth: Payer: Self-pay | Admitting: Neurology

## 2020-12-23 NOTE — Telephone Encounter (Signed)
Patient was a no show for today's visit 12/23/20

## 2020-12-24 ENCOUNTER — Encounter (HOSPITAL_COMMUNITY): Payer: Medicare Other

## 2021-01-10 ENCOUNTER — Encounter (HOSPITAL_COMMUNITY): Payer: Medicare Other

## 2021-01-14 ENCOUNTER — Encounter (HOSPITAL_COMMUNITY): Payer: Medicare Other

## 2021-01-21 ENCOUNTER — Other Ambulatory Visit: Payer: Self-pay

## 2021-01-21 ENCOUNTER — Non-Acute Institutional Stay (HOSPITAL_COMMUNITY)
Admission: RE | Admit: 2021-01-21 | Discharge: 2021-01-21 | Disposition: A | Discharge status: Home / Self Care | Payer: Medicare Other | Source: Ambulatory Visit | Attending: Internal Medicine | Admitting: Internal Medicine

## 2021-01-21 DIAGNOSIS — G35 Multiple sclerosis: Secondary | ICD-10-CM | POA: Insufficient documentation

## 2021-01-21 MED ORDER — METHYLPREDNISOLONE SODIUM SUCC 125 MG IJ SOLR
125.0000 mg | Freq: Once | INTRAMUSCULAR | Status: AC
  Administered 2021-01-21: 125 mg via INTRAVENOUS
  Filled 2021-01-21: qty 2

## 2021-01-21 MED ORDER — SODIUM CHLORIDE 0.9 % IV SOLN
INTRAVENOUS | Status: DC | PRN
  Administered 2021-01-21: 250 mL via INTRAVENOUS

## 2021-01-21 MED ORDER — SODIUM CHLORIDE 0.9 % IV SOLN
600.0000 mg | Freq: Once | INTRAVENOUS | Status: AC
  Administered 2021-01-21: 11:00:00 600 mg via INTRAVENOUS
  Filled 2021-01-21: qty 20

## 2021-01-21 MED ORDER — ACETAMINOPHEN 325 MG PO TABS
650.0000 mg | ORAL_TABLET | Freq: Once | ORAL | Status: AC
  Administered 2021-01-21: 10:00:00 650 mg via ORAL
  Filled 2021-01-21: qty 2

## 2021-01-21 MED ORDER — FAMOTIDINE IN NACL 20-0.9 MG/50ML-% IV SOLN
20.0000 mg | Freq: Once | INTRAVENOUS | Status: AC
  Administered 2021-01-21: 20 mg via INTRAVENOUS
  Filled 2021-01-21: qty 50

## 2021-01-21 MED ORDER — DIPHENHYDRAMINE HCL 50 MG/ML IJ SOLN
50.0000 mg | Freq: Once | INTRAMUSCULAR | Status: AC
  Administered 2021-01-21: 10:00:00 50 mg via INTRAVENOUS
  Filled 2021-01-21: qty 1

## 2021-01-21 NOTE — Progress Notes (Signed)
PATIENT CARE CENTER NOTE   Diagnosis: Multiple Sclerosis (G[facility])     Provider: Despina Arias, MD     Procedure: Ocrevus 600 mg infusion      Note: Patient received Ocrevus 600 mg infusion via PIV. Pre-medications given per order. Infusion titrated per protocol. Patient tolerated infusion well with no adverse reaction. Vital signs remained stable. Patient declined the 1 hour observation post-infusion. Discharge instructions given. Patient to come back in 6 months for next infusion. Alert, oriented and transported in wheelchair at discharge.

## 2021-01-25 ENCOUNTER — Other Ambulatory Visit: Payer: Self-pay | Admitting: Neurology

## 2021-01-25 ENCOUNTER — Ambulatory Visit (INDEPENDENT_AMBULATORY_CARE_PROVIDER_SITE_OTHER): Payer: Medicare Other | Admitting: Neurology

## 2021-01-25 ENCOUNTER — Encounter: Payer: Self-pay | Admitting: Neurology

## 2021-01-25 VITALS — BP 153/103 | HR 81 | Ht 62.0 in

## 2021-01-25 DIAGNOSIS — R269 Unspecified abnormalities of gait and mobility: Secondary | ICD-10-CM

## 2021-01-25 DIAGNOSIS — G8114 Spastic hemiplegia affecting left nondominant side: Secondary | ICD-10-CM | POA: Diagnosis not present

## 2021-01-25 DIAGNOSIS — R3915 Urgency of urination: Secondary | ICD-10-CM

## 2021-01-25 DIAGNOSIS — Z298 Encounter for other specified prophylactic measures: Secondary | ICD-10-CM

## 2021-01-25 DIAGNOSIS — Z79899 Other long term (current) drug therapy: Secondary | ICD-10-CM

## 2021-01-25 DIAGNOSIS — I10 Essential (primary) hypertension: Secondary | ICD-10-CM

## 2021-01-25 DIAGNOSIS — E559 Vitamin D deficiency, unspecified: Secondary | ICD-10-CM

## 2021-01-25 DIAGNOSIS — G35 Multiple sclerosis: Secondary | ICD-10-CM

## 2021-01-25 MED ORDER — METHOCARBAMOL 500 MG PO TABS: 500.0000 mg | ORAL_TABLET | Freq: Four times a day (QID) | ORAL | 5 refills | Status: DC

## 2021-01-25 MED ORDER — HYDROCHLOROTHIAZIDE 25 MG PO TABS: 25.0000 mg | ORAL_TABLET | Freq: Every day | ORAL | 3 refills | Status: DC

## 2021-01-25 NOTE — Progress Notes (Signed)
[facility]  PATIENT: Lindsay Schneider DOB: 09-13-1985    _________________________________   HISTORICAL  CHIEF COMPLAINT:  Chief Complaint  Patient presents with   Follow-up    RM 2, alone. In wheelchair in office today. Unable to stand.. Last seen 06/22/2020. On Ocrevus for MS. Receives at [facility]. Last infusion: 01/21/21. Next: 07/26/20.    HISTORY OF PRESENT ILLNESS:  Lindsay Schneider is a 35 y.o. woman who was diagnosed with MS in 2011 after presenting with left sided arm and leg numbness and weakness.    She has an active/relapsing secondary progressive MS.    Update 01/25/2021: She switched to Ocrevus from Tysabri afte the last visit after she converted to JCV Ab positive at the end of 2020 (high positive at 3.89).   She tolerates OCrevus well.    Her first infusion was May and June 2021      She is wheelchair bound and needs help to transfer in/out of the wheelchair.   He rleft arm and leg are weak   Her left hand has gradually worsened.     Her left side has always been more involved than her with and she has weakness with spasticity.   The right leg is also weak.  The right arm is strong but has reduced coordination.  Handwriting is poor.   She has spasticity in the left arm ha thas gradually worsened.    She feels the spasticity has worsened over the last year.  She is on baclofen 20 mg po qid for the spasticity.    Because tizanidine causes sleepiness she take only at beditme.  She has intermittent numbness on the left side.    She notes more urinary urgency. Only rare urge  incontinence.    She has reduced VA and color vision OS.   She notes visual changes more in bright sunlight.    She sleeps well at night and wakes up after 8 hours.    She needs a new lightweight wheelchair.   MS History:   In 2011, onset of left arm and leg numbness and weakness.  Her symptoms develop over 1 day. She had an MRI performed with lesions consistent with MS. She  started to see Dr. Terrace Arabia here at [facility]. She was placed on Rebif. However, she stopped because her legs felt weaker when she was on it. She then transferred her care to Dr. Renne Crigler at [facility].     She was started on Tysabri.  She had several infusions of Tysabri but transportation was difficult to get to [facility]. Therefore, Dr. Renne Crigler switched her to Rituxan although she tolerated the medication well. She had a significant relapse with bilateral leg weakness within a couple weeks of the infusion. Therefore, 6 months later, she started Tysabri again. Due to difficulties with transportation, she transferred care to [place] in October 2016.  She converted to JCV Ab positive early 2021 She switched to Ocrevus.    Imaging: MRI of the brain 06/25/2015 shows a couple small T2/FLAIR hyperintense foci in the cerebellar hemispheres and other foci in the left middle cerebellar peduncle and pons..   The deep gray matter appears normal.  In the hemispheres, there are are multiple single and confluent T2/FLAIR hyperintense foci, predominantly in the periventricular white matter. Many of these foci are radially oriented to the ventricles.   MRI of the cervical spine 06/23/2015 shows multiple T2 hyperintense foci within the spinal cord. They are located posteriorly to the left  adjacent to C2, posteriorly adjacent to C3, posterior to the right adjacent to C4, posterior to the right adjacent to C5, posterior to the right and central adjacent to C6.   None of these foci enhanced after contrast administration.  REVIEW OF SYSTEMS: Constitutional: No fevers, chills, sweats, or change in appetite.   She has fatigue and poor sleep.    Eyes: No visual changes, double vision, eye pain Ear, nose and throat: No hearing loss, ear pain, nasal congestion, sore throat Cardiovascular: No chest pain, palpitations Respiratory:  No shortness of breath at rest or with exertion.   No wheezes GastrointestinaI: No nausea, vomiting, diarrhea, abdominal  pain, fecal incontinence Genitourinary:  Shehas urinary urgency and frequency with occ incontinence.    Hasnocturia. Musculoskeletal:  No neck pain, back pain Integumentary: No rash, pruritus, skin lesions Neurological: as above Psychiatric:   Some depression at this time.  No anxiety Endocrine: No palpitations, diaphoresis, change in appetite, change in weigh or increased thirst Hematologic/Lymphatic:  No anemia, purpura, petechiae. Allergic/Immunologic: No itchy/runny eyes, nasal congestion, recent allergic reactions, rashes  ALLERGIES: No Known Allergies  HOME MEDICATIONS:  Current Outpatient Medications:    baclofen (LIORESAL) 10 MG tablet, TAKE 1 TO 2 TABLETS 4 TIMES A DAY, Disp: 720 tablet, Rfl: 3   dalfampridine 10 MG TB12, TAKE 1 TABLET BY MOUTH 2 TIMES A DAY (APPROXIMATELY 12 HOURS APART), Disp: 180 tablet, Rfl: 0   hydrOXYzine (ATARAX/VISTARIL) 25 MG tablet, Take 25 mg by mouth at bedtime. , Disp: , Rfl: 0   LORazepam (ATIVAN) 1 MG tablet, One po qHS prn, Disp: 30 tablet, Rfl: 5   methocarbamol (ROBAXIN) 500 MG tablet, Take 1 tablet (500 mg total) by mouth 4 (four) times daily., Disp: 90 tablet, Rfl: 5   oxybutynin (DITROPAN) 5 MG tablet, Take 1 tablet (5 mg total) by mouth 2 (two) times daily., Disp: 60 tablet, Rfl: 11   phentermine (ADIPEX-P) 37.5 MG tablet, TAKE 1 TAB BY MOUTH EVERY DAY IN THE MORNING., Disp: 30 tablet, Rfl: 5   phentermine 37.5 MG capsule, TAKE 1 CAPSULE BY MOUTH EVERY MORNING, Disp: 30 capsule, Rfl: 5   tiZANidine (ZANAFLEX) 4 MG tablet, 1/2 to 1 pill po tid, Disp: 270 tablet, Rfl: 3   triamcinolone (KENALOG) 0.025 % ointment, Apply 1 application topically 2 (two) times daily as needed (rash). , Disp: , Rfl: 0   Vitamin D, Ergocalciferol, (DRISDOL) 1.25 MG (50000 UNIT) CAPS capsule, TAKE 1 CAPSULE (50,000 UNITS TOTAL) BY MOUTH EVERY 7 (SEVEN) DAYS., Disp: 13 capsule, Rfl: 3   hydrochlorothiazide (HYDRODIURIL) 25 MG tablet, Take 1 tablet (25 mg total) by  mouth daily., Disp: 90 tablet, Rfl: 3  PAST MEDICAL HISTORY: Past Medical History:  Diagnosis Date   Movement disorder    MS (multiple sclerosis) (HCC)     PAST SURGICAL HISTORY: Past Surgical History:  Procedure Laterality Date   CESAREAN SECTION      FAMILY HISTORY: Family History  Problem Relation Age of Onset   Asthma Mother    Hypertension Father    Healthy Sister    Healthy Brother     SOCIAL HISTORY:  Social History   Socioeconomic History   Marital status: Single    Spouse name: Not on file   Number of children: Not on file   Years of education: Not on file   Highest education level: Not on file  Occupational History   Not on file  Tobacco Use   Smoking status: Never   Smokeless  tobacco: Never  Vaping Use   Vaping Use: Never used  Substance and Sexual Activity   Alcohol use: No   Drug use: No   Sexual activity: Not Currently    Birth control/protection: Injection  Other Topics Concern   Not on file  Social History Narrative   Not on file   Social Determinants of Health   Financial Resource Strain: Not on file  Food Insecurity: Not on file  Transportation Needs: Not on file  Physical Activity: Not on file  Stress: Not on file  Social Connections: Not on file  Intimate Partner Violence: Not on file     PHYSICAL EXAM  Vitals:   01/25/21 1453  BP: (!) 153/103  Pulse: 81  SpO2: 96%  Height: 5\' 2"  (1.575 m)    Body mass index is 26.33 kg/m.   General: The patient is well-developed and well-nourished and in no acute distress  Neurologic Exam  Mental status: The patient is alert and oriented x 3 at the time of the examination. The patient has apparent normal recent and remote memory, with an apparently normal attention span and concentration ability.   Speech is normal.  Cranial nerves: Extraocular movements are full.. Facial strength and sensation is normal. Trapezius strength is normal.  The tongue is midline, and the patient has  symmetric elevation of the soft palate. No obvious hearing deficits are noted.  Motor: Muscle bulk is normal.  Muscle tone is greatly increased in the left arm and both legs, left greater than right.   Right arm has good tone/strength but reduced RAM.  Strength is 3 to 4-/5 in the left distal arm and 2+ in the proximal arm. Strength is 2  in proximal right and left leg and 2+ in right knee extension and 2 in left knee extension , 2- right and  1 left ankle extensors,    Sensory: she has intact sensation to touch and vibration in the arms.  Normal touch/temperature sensation in the legs but reduced vibration sensation in the left leg.  Coordination: She has mildly reduced finger-nose-finger on the right.  She is unable to do on the left.  She is too weak for heel-to-shin either side  Gait and station: She is unable to stand or walk.  Reflexes: Deep tendon reflexes are increased in arms, left > right and in legs with spread att knees and sustained clonus at the left ankle and nonsustained at the right.       DIAGNOSTIC DATA (LABS, IMAGING, TESTING) - I reviewed patient records, labs, notes, testing and imaging myself where available.  Lab Results  Component Value Date   WBC 8.2 06/22/2020   HGB 13.6 06/22/2020   HCT 41.1 06/22/2020   MCV 83 06/22/2020   PLT 272 06/22/2020      Component Value Date/Time   NA 139 05/21/2019 1057   K 3.9 05/21/2019 1057   CL 104 05/21/2019 1057   CO2 24 05/21/2019 1057   GLUCOSE 80 05/21/2019 1057   GLUCOSE 119 (H) 07/13/2018 0534   BUN 4 (L) 05/21/2019 1057   CREATININE 0.56 (L) 05/21/2019 1057   CALCIUM 9.4 05/21/2019 1057   PROT 6.5 10/22/2019 1137   ALBUMIN 4.2 10/22/2019 1137   AST 13 10/22/2019 1137   ALT 14 10/22/2019 1137   ALKPHOS 92 10/22/2019 1137   BILITOT 0.4 10/22/2019 1137   GFRNONAA 125 05/21/2019 1057   GFRAA 144 05/21/2019 1057       ASSESSMENT AND PLAN  Multiple  sclerosis (HCC) - Plan: MR BRAIN WO CONTRAST, MR CERVICAL  SPINE WO CONTRAST, IgG, IgA, IgM, CBC with Differential/Platelet, Comprehensive metabolic panel  Gait disturbance  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    Continue Ocrevus.  Check IgG/IgM, CBC with differential and CMP.  2.   Continue baclofen up to 80 mg a day and continue lorazepam nightly to help with her insomnia and nighttime spasticity.. Since she is experiencing more spasticity I will add tizanidine and titrate up to 4 mg 3 times daily and further if tolerated 3.    Increase HCTZ to 25 mg as has had increase BP.  Advised she needs to find a PCP   4.    Add metocarbamol for spasticity.   5.    Return in 6 months or sooner if there are new or worsening neurologic symptoms.  40-minute office visit with the majority of the time spent face-to-face for history and physical, discussion/counseling and decision-making.  Additional time with record review and documentation.   Lindsay Schneider A. Epimenio Foot, MD, PhD 01/25/2021, 5:00 PM Certified in Neurology, Clinical Neurophysiology, Sleep Medicine, Pain Medicine and Neuroimaging  [facility] [address] [place], [place] [address] [phone]

## 2021-01-27 ENCOUNTER — Telehealth: Payer: Self-pay | Admitting: Neurology

## 2021-01-27 LAB — CBC WITH DIFFERENTIAL/PLATELET
Basophils Absolute: 0 10*3/uL (ref 0.0–0.2)
Basos: 0 %
EOS (ABSOLUTE): 0.1 10*3/uL (ref 0.0–0.4)
Eos: 1 %
Hematocrit: 43.6 % (ref 34.0–46.6)
Hemoglobin: 14.5 g/dL (ref 11.1–15.9)
Immature Grans (Abs): 0 10*3/uL (ref 0.0–0.1)
Immature Granulocytes: 0 %
Lymphocytes Absolute: 2 10*3/uL (ref 0.7–3.1)
Lymphs: 21 %
MCH: 27.8 pg (ref 26.6–33.0)
MCHC: 33.3 g/dL (ref 31.5–35.7)
MCV: 84 fL (ref 79–97)
Monocytes Absolute: 0.7 10*3/uL (ref 0.1–0.9)
Monocytes: 7 %
Neutrophils Absolute: 6.7 10*3/uL (ref 1.4–7.0)
Neutrophils: 71 %
Platelets: 215 10*3/uL (ref 150–450)
RBC: 5.22 x10E6/uL (ref 3.77–5.28)
RDW: 15 % (ref 11.7–15.4)
WBC: 9.6 10*3/uL (ref 3.4–10.8)

## 2021-01-27 LAB — COMPREHENSIVE METABOLIC PANEL
ALT: 14 IU/L (ref 0–32)
AST: 10 IU/L (ref 0–40)
Albumin/Globulin Ratio: 1.6 (ref 1.2–2.2)
Albumin: 4.5 g/dL (ref 3.8–4.8)
Alkaline Phosphatase: 103 IU/L (ref 44–121)
BUN/Creatinine Ratio: 10 (ref 9–23)
BUN: 6 mg/dL (ref 6–20)
Bilirubin Total: 0.4 mg/dL (ref 0.0–1.2)
CO2: 19 mmol/L — ABNORMAL LOW (ref 20–29)
Calcium: 9.2 mg/dL (ref 8.7–10.2)
Chloride: 101 mmol/L (ref 96–106)
Creatinine, Ser: 0.62 mg/dL (ref 0.57–1.00)
Globulin, Total: 2.9 g/dL (ref 1.5–4.5)
Glucose: 92 mg/dL (ref 65–99)
Potassium: 4 mmol/L (ref 3.5–5.2)
Sodium: 138 mmol/L (ref 134–144)
Total Protein: 7.4 g/dL (ref 6.0–8.5)
eGFR: 121 mL/min/{1.73_m2} (ref >59)

## 2021-01-27 LAB — VITAMIN D 25 HYDROXY (VIT D DEFICIENCY, FRACTURES): Vit D, 25-Hydroxy: 19.5 ng/mL — ABNORMAL LOW (ref 30.0–100.0)

## 2021-01-27 LAB — IGG, IGA, IGM
IgA/Immunoglobulin A, Serum: 379 mg/dL — ABNORMAL HIGH (ref 87–352)
IgG (Immunoglobin G), Serum: 1455 mg/dL (ref 586–1602)
IgM (Immunoglobulin M), Srm: 45 mg/dL (ref 26–217)

## 2021-01-27 MED ORDER — VITAMIN D (ERGOCALCIFEROL) 1.25 MG (50000 UNIT) PO CAPS: 50000.0000 [IU] | ORAL_CAPSULE | ORAL | 3 refills | Status: DC

## 2021-01-27 NOTE — Telephone Encounter (Signed)
-----   Message from Asa Lente, MD sent at 01/27/2021  1:07 PM EDT ----- Vitamin D was low.  Please call in 50,000 units #13 1 p.o. weekly   #3 refills Other labs were fine.

## 2021-01-27 NOTE — Telephone Encounter (Signed)
Called the pt and reviewed the lab results with her. Pt verbalized understanding. Pt had no questions at this time but was encouraged to call back if questions arise.

## 2021-02-03 ENCOUNTER — Other Ambulatory Visit: Payer: Medicare Other

## 2021-02-14 ENCOUNTER — Other Ambulatory Visit: Payer: Self-pay | Admitting: Neurology

## 2021-02-14 DIAGNOSIS — G35 Multiple sclerosis: Secondary | ICD-10-CM

## 2021-02-14 DIAGNOSIS — R269 Unspecified abnormalities of gait and mobility: Secondary | ICD-10-CM

## 2021-02-15 ENCOUNTER — Other Ambulatory Visit: Payer: Medicare Other

## 2021-04-18 ENCOUNTER — Other Ambulatory Visit: Payer: Medicare Other

## 2021-04-20 ENCOUNTER — Ambulatory Visit
Admission: RE | Admit: 2021-04-20 | Discharge: 2021-04-20 | Disposition: A | Discharge status: Home / Self Care | Payer: Medicare Other | Source: Ambulatory Visit | Attending: Neurology | Admitting: Neurology

## 2021-04-20 DIAGNOSIS — G8114 Spastic hemiplegia affecting left nondominant side: Secondary | ICD-10-CM

## 2021-04-20 DIAGNOSIS — G35 Multiple sclerosis: Secondary | ICD-10-CM

## 2021-04-26 ENCOUNTER — Other Ambulatory Visit: Payer: Self-pay | Admitting: Neurology

## 2021-04-26 NOTE — Telephone Encounter (Signed)
Patient is due for a refill on phentermine. Patient is up to date on her appointments. [place] Controlled Substance Registry checked and is appropriate.  

## 2021-04-29 ENCOUNTER — Telehealth: Payer: Self-pay | Admitting: Neurology

## 2021-04-29 NOTE — Telephone Encounter (Signed)
Pt called in wanting to know if her MRI results have come in.

## 2021-05-02 NOTE — Telephone Encounter (Signed)
Called pt. Relayed results per Dr. Bonnita Hollow note. Pt verbalized understanding. She has ppw that needs to be filled out. She will contact Debra/medical records to start this process.

## 2021-05-02 NOTE — Telephone Encounter (Signed)
Reviewed pt chart. Note from MD in result note from 04/23/21: "Please let her know that the MRIs of the brain and cervical spine show a lot of old MS plaques.  However, there do not appear to be any new MS lesions"

## 2021-06-06 ENCOUNTER — Other Ambulatory Visit: Payer: Self-pay | Admitting: Neurology

## 2021-06-21 ENCOUNTER — Other Ambulatory Visit (HOSPITAL_COMMUNITY): Payer: Self-pay

## 2021-07-20 ENCOUNTER — Other Ambulatory Visit: Payer: Self-pay | Admitting: *Deleted

## 2021-07-20 DIAGNOSIS — Z79899 Other long term (current) drug therapy: Secondary | ICD-10-CM

## 2021-07-20 DIAGNOSIS — G35 Multiple sclerosis: Secondary | ICD-10-CM

## 2021-07-26 ENCOUNTER — Encounter (HOSPITAL_COMMUNITY): Payer: Medicare Other

## 2021-08-02 ENCOUNTER — Encounter: Payer: Self-pay | Admitting: Family Medicine

## 2021-08-02 ENCOUNTER — Ambulatory Visit (INDEPENDENT_AMBULATORY_CARE_PROVIDER_SITE_OTHER): Payer: Medicare Other | Admitting: Family Medicine

## 2021-08-02 VITALS — BP 145/96 | HR 81 | Ht 62.0 in | Wt 143.0 lb

## 2021-08-02 DIAGNOSIS — Z993 Dependence on wheelchair: Secondary | ICD-10-CM

## 2021-08-02 DIAGNOSIS — N399 Disorder of urinary system, unspecified: Secondary | ICD-10-CM

## 2021-08-02 DIAGNOSIS — I10 Essential (primary) hypertension: Secondary | ICD-10-CM | POA: Diagnosis not present

## 2021-08-02 DIAGNOSIS — G8114 Spastic hemiplegia affecting left nondominant side: Secondary | ICD-10-CM | POA: Diagnosis not present

## 2021-08-02 DIAGNOSIS — G35 Multiple sclerosis: Secondary | ICD-10-CM | POA: Diagnosis not present

## 2021-08-02 DIAGNOSIS — Z79899 Other long term (current) drug therapy: Secondary | ICD-10-CM

## 2021-08-02 DIAGNOSIS — E559 Vitamin D deficiency, unspecified: Secondary | ICD-10-CM

## 2021-08-02 MED ORDER — TIZANIDINE HCL 4 MG PO TABS: ORAL_TABLET | ORAL | 3 refills | Status: DC

## 2021-08-02 NOTE — Patient Instructions (Signed)
Below is our plan:  We will continue Ocrevus infusions. Continue Baclofen 10-20mg  up to 4 times daily. Start tizanidine 4mg  daily. You may increase to 4mg  up to three times daily if well tolerated. Please continue ampyra 10mg  BID and phenterimine 37.5mg  daily. We will continue HCTZ for your BP. Please monitor at home. I am referring you to a PCP to establish care and help monitor your BP. I am also going to ask home health to evaluate you for PT /OTneeds.   Please make sure you are staying well hydrated. I recommend 50-60 ounces daily. Well balanced diet and regular exercise encouraged. Consistent sleep schedule with 6-8 hours recommended.   Please continue follow up with care team as directed.   Follow up with Dr in 4-6 months   You may receive a survey regarding today's visit. I encourage you to leave honest feed back as I do use this information to improve patient care. Thank you for seeing me today!

## 2021-08-02 NOTE — Progress Notes (Signed)
PATIENT: Lindsay Schneider DOB: 1985-10-12  REASON FOR VISIT: follow up HISTORY FROM: patient  Chief Complaint  Patient presents with   Follow-up    Rm 1, w brother and son. Here for 6 month MS f/u, on Ocrevus. Sx are about the same. Pt continues to have bilateral weakness in legs. Left hand continues to draw up. Unable to open hand.      HISTORY OF PRESENT ILLNESS: 08/02/21 ALL: Lindsay Schneider is a 36 y.o. female here today for follow up for active/relapsing secondary progressive MS. She was switched to Ocrevus infusions following positive JCV. She is tolerating infusions well. Last infusion in July. She has appt scheduled for next infusion with [facility] tomorrow. MRI brain and cervical spine 03/2021 were stable, no new lesions.   She reports that MS symptoms are stable.  She continues to have left-sided weakness.  She is unable to use her left side and right lower ext. She is wheelchair-bound.  She requires assistance with transfers. Family helps with this. She is unable to walk. She continues baclofen 10-20mg  QID for spasticity as well as Ampyra for stiffness. She has not taken tizanidine recently. She didn't know she had refills of this medication. She has never taken methocarbamol. She   Mood is good. No worsening depression or anxiety. Sleep is fair. She has not taken Ativan recently in quite sometime. She is taking phentermine 37.5mg . She does not feel it helps with energy as much as it used to but she feels it helps to keep her bowel moving regularly.   She also continues Ditropan for overactive bladder.  No incontinence. She has family help her transfer to toilet.   Vitamin D was low. She takes 50000iu weekly. Dr Epimenio Foot increased HCTZ to 25mg  daily and advised follow up with PCP for increased BP. BP has been normal at home but she is unable to remember any specific readings. She does not have a primary care provider.   HISTORY: (copied from Dr Bonnita Hollow note on 09/20/2018)  Lindsay Schneider is a 36 y.o. woman who was diagnosed with MS in 2011 after presenting with left sided arm and leg numbness and weakness.    She has an active/relapsing secondary progressive MS.     Update 01/25/2021: She switched to Ocrevus from Tysabri afte the last visit after she converted to JCV Ab positive at the end of 2020 (high positive at 3.89).   She tolerates OCrevus well.    Her first infusion was May and June 2021       She is wheelchair bound and needs help to transfer in/out of the wheelchair.   He rleft arm and leg are weak   Her left hand has gradually worsened.     Her left side has always been more involved than her with and she has weakness with spasticity.   The right leg is also weak.  The right arm is strong but has reduced coordination.  Handwriting is poor.   She has spasticity in the left arm ha thas gradually worsened.    She feels the spasticity has worsened over the last year.  She is on baclofen 20 mg po qid for the spasticity.    Because tizanidine causes sleepiness she take only at beditme.  She has intermittent numbness on the left side.    She notes more urinary urgency. Only rare urge  incontinence.    She has reduced VA and color vision OS.   She notes visual  changes more in bright sunlight.     She sleeps well at night and wakes up after 8 hours.     She needs a new lightweight wheelchair.     MS History:   In 2011, onset of left arm and leg numbness and weakness.  Her symptoms develop over 1 day. She had an MRI performed with lesions consistent with MS. She started to see Dr. Terrace Arabia here at [facility]. She was placed on Rebif. However, she stopped because her legs felt weaker when she was on it. She then transferred her care to Dr. Renne Crigler at [facility]. She was started on Tysabri.  She had several infusions of Tysabri but transportation was difficult to get to [facility]. Therefore, Dr. Renne Crigler switched her to Rituxan although she tolerated the medication well. She had a significant relapse  with bilateral leg weakness within a couple weeks of the infusion. Therefore, 6 months later, she started Tysabri again. Due to difficulties with transportation, she transferred care to [place] in October 2016.  She converted to JCV Ab positive early 2021 She switched to Ocrevus.     Imaging: MRI of the brain 06/25/2015 shows a couple small T2/FLAIR hyperintense foci in the cerebellar hemispheres and other foci in the left middle cerebellar peduncle and pons..   The deep gray matter appears normal.  In the hemispheres, there are are multiple single and confluent T2/FLAIR hyperintense foci, predominantly in the periventricular white matter. Many of these foci are radially oriented to the ventricles.    MRI of the cervical spine 06/23/2015 shows multiple T2 hyperintense foci within the spinal cord. They are located posteriorly to the left adjacent to C2, posteriorly adjacent to C3, posterior to the right adjacent to C4, posterior to the right adjacent to C5, posterior to the right and central adjacent to C6.   None of these foci enhanced after contrast administration.    REVIEW OF SYSTEMS: Out of a complete 14 system review of symptoms, the patient complains only of the following symptoms, weakness, numbness, spacticity, frequent urination and all other reviewed systems are negative.  ALLERGIES: No Known Allergies  HOME MEDICATIONS: Outpatient Medications Prior to Visit  Medication Sig Dispense Refill   baclofen (LIORESAL) 10 MG tablet TAKE 1 TO 2 TABLETS 4 TIMES A DAY 720 tablet 0   dalfampridine 10 MG TB12 TAKE 1 TABLET BY MOUTH 2 TIMES A DAY (APPROXIMATELY 12 HOURS APART) 180 tablet 0   hydrochlorothiazide (HYDRODIURIL) 25 MG tablet Take 1 tablet (25 mg total) by mouth daily. 90 tablet 3   oxybutynin (DITROPAN) 5 MG tablet Take 1 tablet (5 mg total) by mouth 2 (two) times daily. 60 tablet 11   phentermine 37.5 MG capsule TAKE 1 CAPSULE BY MOUTH EVERY DAY IN THE MORNING 30 capsule 5   triamcinolone  (KENALOG) 0.025 % ointment Apply 1 application topically 2 (two) times daily as needed (rash).   0   Vitamin D, Ergocalciferol, (DRISDOL) 1.25 MG (50000 UNIT) CAPS capsule Take 1 capsule (50,000 Units total) by mouth every 7 (seven) days. 13 capsule 3   cyclobenzaprine (FLEXERIL) 5 MG tablet Take 1 tablet (5 mg total) by mouth 3 (three) times daily. 90 tablet 5   hydrOXYzine (ATARAX/VISTARIL) 25 MG tablet Take 25 mg by mouth at bedtime.   0   LORazepam (ATIVAN) 1 MG tablet One po qHS prn 30 tablet 5   tiZANidine (ZANAFLEX) 4 MG tablet 1/2 to 1 pill po tid 270 tablet 3   No facility-administered medications  prior to visit.    PAST MEDICAL HISTORY: Past Medical History:  Diagnosis Date   Movement disorder    MS (multiple sclerosis) (HCC)     PAST SURGICAL HISTORY: Past Surgical History:  Procedure Laterality Date   CESAREAN SECTION      FAMILY HISTORY: Family History  Problem Relation Age of Onset   Asthma Mother    Hypertension Father    Healthy Sister    Healthy Brother     SOCIAL HISTORY: Social History   Socioeconomic History   Marital status: Single    Spouse name: Not on file   Number of children: Not on file   Years of education: Not on file   Highest education level: Not on file  Occupational History   Not on file  Tobacco Use   Smoking status: Never   Smokeless tobacco: Never  Vaping Use   Vaping Use: Never used  Substance and Sexual Activity   Alcohol use: No   Drug use: No   Sexual activity: Not Currently    Birth control/protection: Injection  Other Topics Concern   Not on file  Social History Narrative   Not on file   Social Determinants of Health   Financial Resource Strain: Not on file  Food Insecurity: Not on file  Transportation Needs: Not on file  Physical Activity: Not on file  Stress: Not on file  Social Connections: Not on file  Intimate Partner Violence: Not on file      PHYSICAL EXAM  Vitals:   08/02/21 1511  BP: (!)  145/96  Pulse: 81  Weight: 143 lb (64.9 kg)  Height: 5\' 2"  (1.575 m)    Body mass index is 26.16 kg/m.  Generalized: Well developed, in no acute distress  Cardiology: normal rate and rhythm, no murmur noted Neurological examination  Mentation: Alert oriented to time, place, history taking. Follows all commands speech and language fluent Cranial nerve II-XII: Pupils were equal round reactive to light. Extraocular movements were full, visual field were full on confrontational test. Facial sensation and strength were normal. Uvula tongue midline. Head turning and shoulder shrug  were normal and symmetric. Motor: The motor testing reveals 5 over 5 strength of right upper extremity,3/5 of left upper flexion, 2/5 left upper extension, 2/5 bilateral lower extremities. Good symmetric motor tone is noted throughout.  Sensory: Sensory testing is intact to soft touch on all 4 extremities. No evidence of extinction is noted.  Coordination: Cerebellar testing reveals good finger-nose-finger with right hand, unable to perform with left hand and bilateral lower extremities.   Gait and station: Unable to ambulate, patient is in wheelchair today. Reflexes: Deep tendon reflexes are diminished in bilateral lower patellar   DIAGNOSTIC DATA (LABS, IMAGING, TESTING) - I reviewed patient records, labs, notes, testing and imaging myself where available.  No flowsheet data found.   Lab Results  Component Value Date   WBC 9.6 01/25/2021   HGB 14.5 01/25/2021   HCT 43.6 01/25/2021   MCV 84 01/25/2021   PLT 215 01/25/2021      Component Value Date/Time   NA 138 01/25/2021 1547   K 4.0 01/25/2021 1547   CL 101 01/25/2021 1547   CO2 19 (L) 01/25/2021 1547   GLUCOSE 92 01/25/2021 1547   GLUCOSE 119 (H) 07/13/2018 0534   BUN 6 01/25/2021 1547   CREATININE 0.62 01/25/2021 1547   CALCIUM 9.2 01/25/2021 1547   PROT 7.4 01/25/2021 1547   ALBUMIN 4.5 01/25/2021 1547   AST  10 01/25/2021 1547   ALT 14  01/25/2021 1547   ALKPHOS 103 01/25/2021 1547   BILITOT 0.4 01/25/2021 1547   GFRNONAA 125 05/21/2019 1057   GFRAA 144 05/21/2019 1057   No results found for: CHOL, HDL, LDLCALC, LDLDIRECT, TRIG, CHOLHDL No results found for: ONGE9B No results found for: VITAMINB12 No results found for: TSH     ASSESSMENT AND PLAN 36 y.o. year old female  has a past medical history of Movement disorder and MS (multiple sclerosis) (HCC). here with     ICD-10-CM   1. Secondary progressive multiple sclerosis (HCC)  G35 CMP    CBC with Differential/Platelets    IgG, IgA, IgM    Ambulatory referral to [facility]    Ambulatory referral to Home Health    2. High risk medication use  Z79.899     3. 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 Wheelchair dependence  Z99.3 Ambulatory referral to Home Health    5. Vitamin D deficiency  E55.9     6. Urinary disorder  N39.9     7. Primary hypertension  I10 Ambulatory referral to [facility] feels that MS symptoms are fairly stable. She will continue Ocrevus infusions every 6 months. I will update labs today.  We will anticipate replacing vitamin D as needed.  She will continue baclofen 10-20mg  and may take up to 4 times daily. She may also start tizanidine  daily and titrate to  TID if well tolerated. Continue Ampyra  BID. Continue phentermine 37.5mg  daily. Monitor BP closely. I have placed a referral to PCP to establish care.  I would also like for her to participate in physical therapy for spasticity and hemiplegia.  HH order placed. She is wheelchair bound and has family assisting with transfers. I would like for her to follow-up in 4 to 6 months with [name].  She verbalizes understanding and agreement with this plan.   Orders Placed This Encounter  Procedures   CMP   CBC with Differential/Platelets   IgG, IgA, IgM    Ambulatory referral to [facility]    Referral Priority:   Routine    Referral Type:   Consultation    Referral Reason:   Specialty Services Required    Requested Specialty:   Family Medicine    Number of Visits Requested:   1   Ambulatory referral to Home Health    Referral Priority:   Routine    Referral Type:   Home Health Care    Referral Reason:   Specialty Services Required    Requested Specialty:   Home Health Services    Number of Visits Requested:   1     Meds ordered this encounter  Medications   tiZANidine (ZANAFLEX) 4 MG tablet    Sig: 1/2 to 1 pill po tid    Dispense:  270 tablet    Refill:  3    Order Specific Question:   Supervising Provider    Answer:   Anson Fret [id number], FNP-C 08/02/2021, 4:08 PM [facility] [address] [place], [place] [address] [phone]

## 2021-08-03 ENCOUNTER — Non-Acute Institutional Stay (HOSPITAL_COMMUNITY)
Admission: RE | Admit: 2021-08-03 | Discharge: 2021-08-03 | Disposition: A | Discharge status: Home / Self Care | Payer: Medicare Other | Source: Ambulatory Visit | Attending: Internal Medicine | Admitting: Internal Medicine

## 2021-08-03 ENCOUNTER — Other Ambulatory Visit: Payer: Self-pay

## 2021-08-03 ENCOUNTER — Telehealth: Payer: Self-pay | Admitting: Family Medicine

## 2021-08-03 ENCOUNTER — Telehealth: Payer: Self-pay | Admitting: Neurology

## 2021-08-03 DIAGNOSIS — Z79899 Other long term (current) drug therapy: Secondary | ICD-10-CM | POA: Insufficient documentation

## 2021-08-03 DIAGNOSIS — G35 Multiple sclerosis: Secondary | ICD-10-CM | POA: Insufficient documentation

## 2021-08-03 LAB — CBC WITH DIFFERENTIAL/PLATELET
Basophils Absolute: 0 10*3/uL (ref 0.0–0.2)
Basos: 0 %
EOS (ABSOLUTE): 0.2 10*3/uL (ref 0.0–0.4)
Eos: 2 %
Hematocrit: 37.6 % (ref 34.0–46.6)
Hemoglobin: 12.6 g/dL (ref 11.1–15.9)
Immature Grans (Abs): 0 10*3/uL (ref 0.0–0.1)
Immature Granulocytes: 0 %
Lymphocytes Absolute: 2 10*3/uL (ref 0.7–3.1)
Lymphs: 20 %
MCH: 27.6 pg (ref 26.6–33.0)
MCHC: 33.5 g/dL (ref 31.5–35.7)
MCV: 83 fL (ref 79–97)
Monocytes Absolute: 0.6 10*3/uL (ref 0.1–0.9)
Monocytes: 6 %
Neutrophils Absolute: 7.3 10*3/uL — ABNORMAL HIGH (ref 1.4–7.0)
Neutrophils: 72 %
Platelets: 256 10*3/uL (ref 150–450)
RBC: 4.56 x10E6/uL (ref 3.77–5.28)
RDW: 14 % (ref 11.7–15.4)
WBC: 10.2 10*3/uL (ref 3.4–10.8)

## 2021-08-03 LAB — COMPREHENSIVE METABOLIC PANEL
ALT: 17 IU/L (ref 0–32)
AST: 15 IU/L (ref 0–40)
Albumin/Globulin Ratio: 1.7 (ref 1.2–2.2)
Albumin: 4.4 g/dL (ref 3.8–4.8)
Alkaline Phosphatase: 94 IU/L (ref 44–121)
BUN/Creatinine Ratio: 10 (ref 9–23)
BUN: 6 mg/dL (ref 6–20)
Bilirubin Total: 0.4 mg/dL (ref 0.0–1.2)
CO2: 23 mmol/L (ref 20–29)
Calcium: 9.1 mg/dL (ref 8.7–10.2)
Chloride: 102 mmol/L (ref 96–106)
Creatinine, Ser: 0.6 mg/dL (ref 0.57–1.00)
Globulin, Total: 2.6 g/dL (ref 1.5–4.5)
Glucose: 89 mg/dL (ref 70–99)
Potassium: 4.1 mmol/L (ref 3.5–5.2)
Sodium: 138 mmol/L (ref 134–144)
Total Protein: 7 g/dL (ref 6.0–8.5)
eGFR: 121 mL/min/{1.73_m2} (ref >59)

## 2021-08-03 LAB — IGG, IGA, IGM
IgA/Immunoglobulin A, Serum: 312 mg/dL (ref 87–352)
IgG (Immunoglobin G), Serum: 1252 mg/dL (ref 586–1602)
IgM (Immunoglobulin M), Srm: 39 mg/dL (ref 26–217)

## 2021-08-03 MED ORDER — FAMOTIDINE 20 MG IN NS 100 ML IVPB
20.0000 mg | Freq: Once | INTRAVENOUS | Status: AC
  Filled 2021-08-03: qty 100

## 2021-08-03 MED ORDER — SODIUM CHLORIDE 0.9 % IV SOLN: INTRAVENOUS | Status: DC | PRN

## 2021-08-03 MED ORDER — ACETAMINOPHEN 325 MG PO TABS
650.0000 mg | ORAL_TABLET | Freq: Once | ORAL | Status: AC
  Administered 2021-08-03: 650 mg via ORAL
  Filled 2021-08-03: qty 2

## 2021-08-03 MED ORDER — METHYLPREDNISOLONE SODIUM SUCC 125 MG IJ SOLR
125.0000 mg | Freq: Once | INTRAMUSCULAR | Status: AC
  Administered 2021-08-03: 125 mg via INTRAVENOUS
  Filled 2021-08-03: qty 2

## 2021-08-03 MED ORDER — SODIUM CHLORIDE 0.9 % IV SOLN
600.0000 mg | Freq: Once | INTRAVENOUS | Status: AC
  Administered 2021-08-03: 600 mg via INTRAVENOUS
  Filled 2021-08-03: qty 20

## 2021-08-03 MED ORDER — DIPHENHYDRAMINE HCL 50 MG/ML IJ SOLN
50.0000 mg | Freq: Once | INTRAMUSCULAR | Status: AC
  Administered 2021-08-03: 50 mg via INTRAVENOUS
  Filled 2021-08-03: qty 1

## 2021-08-03 MED ORDER — FAMOTIDINE IN NACL 20-0.9 MG/50ML-% IV SOLN
20.0000 mg | Freq: Once | INTRAVENOUS | Status: AC
  Administered 2021-08-03: 20 mg via INTRAVENOUS
  Filled 2021-08-03: qty 50

## 2021-08-03 NOTE — Telephone Encounter (Signed)
Home Health orders sent to Angela C. w/[facility]. She will send for review & let me know if they can accept referral. °

## 2021-08-03 NOTE — Telephone Encounter (Signed)
Called the pt and there was no answer. LVM advising that lab results looked good and pt should continue current treatment plan. Advised her to call back if she has questions.

## 2021-08-03 NOTE — Progress Notes (Signed)
Patient received IV Ocrevus 600mg  as ordered by MD. Pre infusion medications - Tylenol PO,Benadryl IV,  Solu-medrol  IV and Famotidine IV were given. Medication was titrated per protocol. Patient declined to wait for the 60 minutes post infusion observation. Tolerated well, vitals stable, discharge instructions given, verbalized understanding. Patient instructed to schedule 6 month appointment at front desk prior to leaving, verbalized understanding. Patient alert and oriented and discharged in wheelchair accompanied by family member.

## 2021-08-03 NOTE — Telephone Encounter (Signed)
-----   Message from Debbora Presto, NP sent at 08/03/2021 12:13 PM EST ----- Please let her know that her labs are stable. Continue current treatment plan.

## 2021-08-05 ENCOUNTER — Other Ambulatory Visit: Payer: Self-pay | Admitting: Neurology

## 2021-08-08 ENCOUNTER — Encounter: Payer: Self-pay | Admitting: Family Medicine

## 2021-08-08 DIAGNOSIS — G35 Multiple sclerosis: Secondary | ICD-10-CM | POA: Diagnosis not present

## 2021-08-08 DIAGNOSIS — N399 Disorder of urinary system, unspecified: Secondary | ICD-10-CM | POA: Diagnosis not present

## 2021-08-08 DIAGNOSIS — M25552 Pain in left hip: Secondary | ICD-10-CM | POA: Diagnosis not present

## 2021-08-08 DIAGNOSIS — G259 Extrapyramidal and movement disorder, unspecified: Secondary | ICD-10-CM | POA: Diagnosis not present

## 2021-08-08 DIAGNOSIS — A4189 Other specified sepsis: Secondary | ICD-10-CM | POA: Diagnosis not present

## 2021-08-08 DIAGNOSIS — I10 Essential (primary) hypertension: Secondary | ICD-10-CM | POA: Diagnosis not present

## 2021-08-08 DIAGNOSIS — Z993 Dependence on wheelchair: Secondary | ICD-10-CM | POA: Diagnosis not present

## 2021-08-08 DIAGNOSIS — M21372 Foot drop, left foot: Secondary | ICD-10-CM | POA: Diagnosis not present

## 2021-08-08 DIAGNOSIS — G8114 Spastic hemiplegia affecting left nondominant side: Secondary | ICD-10-CM | POA: Diagnosis not present

## 2021-08-08 NOTE — Telephone Encounter (Signed)
Called back and provided VO as requested. Ailene Ravel verbalized understanding.

## 2021-08-08 NOTE — Telephone Encounter (Signed)
[facility] Sharyl Nimrod) request verbal order to move OT evaluation to 08/15/21. Frequency:  1x wk for 1 wk

## 2021-08-08 NOTE — Telephone Encounter (Signed)
error 

## 2021-08-09 ENCOUNTER — Other Ambulatory Visit: Payer: Self-pay | Admitting: Neurology

## 2021-08-09 DIAGNOSIS — R269 Unspecified abnormalities of gait and mobility: Secondary | ICD-10-CM

## 2021-08-09 DIAGNOSIS — G35 Multiple sclerosis: Secondary | ICD-10-CM

## 2021-08-15 ENCOUNTER — Other Ambulatory Visit: Payer: Self-pay | Admitting: Neurology

## 2021-08-16 ENCOUNTER — Telehealth: Payer: Self-pay | Admitting: Family Medicine

## 2021-08-16 DIAGNOSIS — G259 Extrapyramidal and movement disorder, unspecified: Secondary | ICD-10-CM | POA: Diagnosis not present

## 2021-08-16 DIAGNOSIS — A4189 Other specified sepsis: Secondary | ICD-10-CM | POA: Diagnosis not present

## 2021-08-16 DIAGNOSIS — G8114 Spastic hemiplegia affecting left nondominant side: Secondary | ICD-10-CM | POA: Diagnosis not present

## 2021-08-16 DIAGNOSIS — N399 Disorder of urinary system, unspecified: Secondary | ICD-10-CM | POA: Diagnosis not present

## 2021-08-16 DIAGNOSIS — I10 Essential (primary) hypertension: Secondary | ICD-10-CM | POA: Diagnosis not present

## 2021-08-16 DIAGNOSIS — G35 Multiple sclerosis: Secondary | ICD-10-CM | POA: Diagnosis not present

## 2021-08-16 NOTE — Telephone Encounter (Signed)
FYI. Let us know if you would like to make any changes.

## 2021-08-16 NOTE — Telephone Encounter (Signed)
[facility] [place]) reporting pt's BP out of perimeter set by the physician; BP 134/96. Taking medication as prescribed, no pain, staying hydrated through out the day. If need to follow up can call back.

## 2021-08-17 ENCOUNTER — Telehealth: Payer: Self-pay | Admitting: Family Medicine

## 2021-08-17 DIAGNOSIS — I10 Essential (primary) hypertension: Secondary | ICD-10-CM | POA: Diagnosis not present

## 2021-08-17 DIAGNOSIS — A4189 Other specified sepsis: Secondary | ICD-10-CM | POA: Diagnosis not present

## 2021-08-17 DIAGNOSIS — G8114 Spastic hemiplegia affecting left nondominant side: Secondary | ICD-10-CM | POA: Diagnosis not present

## 2021-08-17 DIAGNOSIS — N399 Disorder of urinary system, unspecified: Secondary | ICD-10-CM | POA: Diagnosis not present

## 2021-08-17 DIAGNOSIS — G259 Extrapyramidal and movement disorder, unspecified: Secondary | ICD-10-CM | POA: Diagnosis not present

## 2021-08-17 DIAGNOSIS — G35 Multiple sclerosis: Secondary | ICD-10-CM | POA: Diagnosis not present

## 2021-08-17 NOTE — Telephone Encounter (Signed)
OT Meredith @ [facility] has called for verbal orders for 1 week 2,  2 week 3 and 1 week 2  Vm secure for messages.

## 2021-08-17 NOTE — Telephone Encounter (Signed)
Called [place] back. Advised pt should f/u with a PCP about this. We did refer her to PCP back on 08/02/21. Pt made aware she needed to establish asap with PCP to address BP concerns. She verbalized understanding and will remind pt about this as well. Nothing further needed.

## 2021-08-17 NOTE — Telephone Encounter (Signed)
Called the OT back and was able to give the VO for the pt. She states that she was going to be sending orders for splints for the pt and asked [place] to keep a look out.

## 2021-08-18 DIAGNOSIS — A4189 Other specified sepsis: Secondary | ICD-10-CM | POA: Diagnosis not present

## 2021-08-18 DIAGNOSIS — G259 Extrapyramidal and movement disorder, unspecified: Secondary | ICD-10-CM | POA: Diagnosis not present

## 2021-08-18 DIAGNOSIS — G8114 Spastic hemiplegia affecting left nondominant side: Secondary | ICD-10-CM | POA: Diagnosis not present

## 2021-08-18 DIAGNOSIS — N399 Disorder of urinary system, unspecified: Secondary | ICD-10-CM | POA: Diagnosis not present

## 2021-08-18 DIAGNOSIS — I10 Essential (primary) hypertension: Secondary | ICD-10-CM | POA: Diagnosis not present

## 2021-08-18 DIAGNOSIS — G35 Multiple sclerosis: Secondary | ICD-10-CM | POA: Diagnosis not present

## 2021-08-18 NOTE — Telephone Encounter (Signed)
Took call from phone staff and spoke with Vicente Males Orthoptist) and Claiborne Billings (quality nurse). They explained that when a home health referral is placed, the provider is agreeing to manage all of patient's plan or care needs (unless specified otherwise on referral). For example (any BP issues/falls, contact PCP). They have their own internal parameters for BP. If BP elevated, they usually hold services and contact ordering provider.  I explained that AL,NP spoke w/ pt at last visit 08/02/21 that she needs to get established w/ PCP to manage/monitor BP and Dr. Felecia Shelling confirmed that again this week. Referral was placed for her to get established with PCP back on 08/02/21. They will f/u with pt about this.   If any questions, their phone number is: [phone]

## 2021-08-18 NOTE — Telephone Encounter (Signed)
[facility]edd[facility] Piqua) reporting pt's BP is elevated 119/95. Discussing with pt about PCP that offer home health services.

## 2021-08-18 NOTE — Telephone Encounter (Signed)
noted 

## 2021-08-23 DIAGNOSIS — G259 Extrapyramidal and movement disorder, unspecified: Secondary | ICD-10-CM | POA: Diagnosis not present

## 2021-08-23 DIAGNOSIS — I10 Essential (primary) hypertension: Secondary | ICD-10-CM | POA: Diagnosis not present

## 2021-08-23 DIAGNOSIS — A4189 Other specified sepsis: Secondary | ICD-10-CM | POA: Diagnosis not present

## 2021-08-23 DIAGNOSIS — N399 Disorder of urinary system, unspecified: Secondary | ICD-10-CM | POA: Diagnosis not present

## 2021-08-23 DIAGNOSIS — G8114 Spastic hemiplegia affecting left nondominant side: Secondary | ICD-10-CM | POA: Diagnosis not present

## 2021-08-23 DIAGNOSIS — G35 Multiple sclerosis: Secondary | ICD-10-CM | POA: Diagnosis not present

## 2021-08-25 DIAGNOSIS — N399 Disorder of urinary system, unspecified: Secondary | ICD-10-CM | POA: Diagnosis not present

## 2021-08-25 DIAGNOSIS — G259 Extrapyramidal and movement disorder, unspecified: Secondary | ICD-10-CM | POA: Diagnosis not present

## 2021-08-25 DIAGNOSIS — G35 Multiple sclerosis: Secondary | ICD-10-CM | POA: Diagnosis not present

## 2021-08-25 DIAGNOSIS — I10 Essential (primary) hypertension: Secondary | ICD-10-CM | POA: Diagnosis not present

## 2021-08-25 DIAGNOSIS — A4189 Other specified sepsis: Secondary | ICD-10-CM | POA: Diagnosis not present

## 2021-08-25 DIAGNOSIS — G8114 Spastic hemiplegia affecting left nondominant side: Secondary | ICD-10-CM | POA: Diagnosis not present

## 2021-08-29 ENCOUNTER — Other Ambulatory Visit: Payer: Self-pay | Admitting: Neurology

## 2021-08-30 DIAGNOSIS — I10 Essential (primary) hypertension: Secondary | ICD-10-CM | POA: Diagnosis not present

## 2021-08-30 DIAGNOSIS — N399 Disorder of urinary system, unspecified: Secondary | ICD-10-CM | POA: Diagnosis not present

## 2021-08-30 DIAGNOSIS — G35 Multiple sclerosis: Secondary | ICD-10-CM | POA: Diagnosis not present

## 2021-08-30 DIAGNOSIS — G8114 Spastic hemiplegia affecting left nondominant side: Secondary | ICD-10-CM | POA: Diagnosis not present

## 2021-08-30 DIAGNOSIS — A4189 Other specified sepsis: Secondary | ICD-10-CM | POA: Diagnosis not present

## 2021-08-30 DIAGNOSIS — G259 Extrapyramidal and movement disorder, unspecified: Secondary | ICD-10-CM | POA: Diagnosis not present

## 2021-08-31 DIAGNOSIS — G259 Extrapyramidal and movement disorder, unspecified: Secondary | ICD-10-CM | POA: Diagnosis not present

## 2021-08-31 DIAGNOSIS — G8114 Spastic hemiplegia affecting left nondominant side: Secondary | ICD-10-CM | POA: Diagnosis not present

## 2021-08-31 DIAGNOSIS — A4189 Other specified sepsis: Secondary | ICD-10-CM | POA: Diagnosis not present

## 2021-08-31 DIAGNOSIS — N399 Disorder of urinary system, unspecified: Secondary | ICD-10-CM | POA: Diagnosis not present

## 2021-08-31 DIAGNOSIS — I10 Essential (primary) hypertension: Secondary | ICD-10-CM | POA: Diagnosis not present

## 2021-08-31 DIAGNOSIS — G35 Multiple sclerosis: Secondary | ICD-10-CM | POA: Diagnosis not present

## 2021-09-01 DIAGNOSIS — G259 Extrapyramidal and movement disorder, unspecified: Secondary | ICD-10-CM | POA: Diagnosis not present

## 2021-09-01 DIAGNOSIS — G8114 Spastic hemiplegia affecting left nondominant side: Secondary | ICD-10-CM | POA: Diagnosis not present

## 2021-09-01 DIAGNOSIS — A4189 Other specified sepsis: Secondary | ICD-10-CM | POA: Diagnosis not present

## 2021-09-01 DIAGNOSIS — G35 Multiple sclerosis: Secondary | ICD-10-CM | POA: Diagnosis not present

## 2021-09-01 DIAGNOSIS — N399 Disorder of urinary system, unspecified: Secondary | ICD-10-CM | POA: Diagnosis not present

## 2021-09-01 DIAGNOSIS — I10 Essential (primary) hypertension: Secondary | ICD-10-CM | POA: Diagnosis not present

## 2021-09-07 ENCOUNTER — Telehealth: Payer: Self-pay | Admitting: Family Medicine

## 2021-09-07 DIAGNOSIS — G259 Extrapyramidal and movement disorder, unspecified: Secondary | ICD-10-CM | POA: Diagnosis not present

## 2021-09-07 DIAGNOSIS — Z993 Dependence on wheelchair: Secondary | ICD-10-CM | POA: Diagnosis not present

## 2021-09-07 DIAGNOSIS — M21372 Foot drop, left foot: Secondary | ICD-10-CM | POA: Diagnosis not present

## 2021-09-07 DIAGNOSIS — I10 Essential (primary) hypertension: Secondary | ICD-10-CM | POA: Diagnosis not present

## 2021-09-07 DIAGNOSIS — G8114 Spastic hemiplegia affecting left nondominant side: Secondary | ICD-10-CM | POA: Diagnosis not present

## 2021-09-07 DIAGNOSIS — G35 Multiple sclerosis: Secondary | ICD-10-CM | POA: Diagnosis not present

## 2021-09-07 DIAGNOSIS — M25552 Pain in left hip: Secondary | ICD-10-CM | POA: Diagnosis not present

## 2021-09-07 DIAGNOSIS — N399 Disorder of urinary system, unspecified: Secondary | ICD-10-CM | POA: Diagnosis not present

## 2021-09-07 DIAGNOSIS — A4189 Other specified sepsis: Secondary | ICD-10-CM | POA: Diagnosis not present

## 2021-09-07 NOTE — Telephone Encounter (Signed)
Revonda Standard @ [facility] is asking if Amy, NP will sign off on the 485 order for plan of care for pt .  They are asking for Home health PT and OT.  Revonda Standard can be reached at [phone]

## 2021-09-07 NOTE — Telephone Encounter (Signed)
I have forwarded the orders and papers that were sent in. They have been faxed to [facility] now that Amy has signed them. Faxed to the number on the paperwork and received confirmation

## 2021-09-08 DIAGNOSIS — A4189 Other specified sepsis: Secondary | ICD-10-CM | POA: Diagnosis not present

## 2021-09-08 DIAGNOSIS — G35 Multiple sclerosis: Secondary | ICD-10-CM | POA: Diagnosis not present

## 2021-09-08 DIAGNOSIS — Z0000 Encounter for general adult medical examination without abnormal findings: Secondary | ICD-10-CM | POA: Diagnosis not present

## 2021-09-08 DIAGNOSIS — N399 Disorder of urinary system, unspecified: Secondary | ICD-10-CM | POA: Diagnosis not present

## 2021-09-08 DIAGNOSIS — M6281 Muscle weakness (generalized): Secondary | ICD-10-CM | POA: Diagnosis not present

## 2021-09-08 DIAGNOSIS — G8114 Spastic hemiplegia affecting left nondominant side: Secondary | ICD-10-CM | POA: Diagnosis not present

## 2021-09-08 DIAGNOSIS — I10 Essential (primary) hypertension: Secondary | ICD-10-CM | POA: Diagnosis not present

## 2021-09-08 DIAGNOSIS — G259 Extrapyramidal and movement disorder, unspecified: Secondary | ICD-10-CM | POA: Diagnosis not present

## 2021-09-08 DIAGNOSIS — E669 Obesity, unspecified: Secondary | ICD-10-CM | POA: Diagnosis not present

## 2021-09-09 DIAGNOSIS — N399 Disorder of urinary system, unspecified: Secondary | ICD-10-CM | POA: Diagnosis not present

## 2021-09-09 DIAGNOSIS — G35 Multiple sclerosis: Secondary | ICD-10-CM | POA: Diagnosis not present

## 2021-09-09 DIAGNOSIS — A4189 Other specified sepsis: Secondary | ICD-10-CM | POA: Diagnosis not present

## 2021-09-09 DIAGNOSIS — G8114 Spastic hemiplegia affecting left nondominant side: Secondary | ICD-10-CM | POA: Diagnosis not present

## 2021-09-09 DIAGNOSIS — I10 Essential (primary) hypertension: Secondary | ICD-10-CM | POA: Diagnosis not present

## 2021-09-09 DIAGNOSIS — G259 Extrapyramidal and movement disorder, unspecified: Secondary | ICD-10-CM | POA: Diagnosis not present

## 2021-09-13 DIAGNOSIS — G259 Extrapyramidal and movement disorder, unspecified: Secondary | ICD-10-CM | POA: Diagnosis not present

## 2021-09-13 DIAGNOSIS — I10 Essential (primary) hypertension: Secondary | ICD-10-CM | POA: Diagnosis not present

## 2021-09-13 DIAGNOSIS — G8114 Spastic hemiplegia affecting left nondominant side: Secondary | ICD-10-CM | POA: Diagnosis not present

## 2021-09-13 DIAGNOSIS — A4189 Other specified sepsis: Secondary | ICD-10-CM | POA: Diagnosis not present

## 2021-09-13 DIAGNOSIS — N399 Disorder of urinary system, unspecified: Secondary | ICD-10-CM | POA: Diagnosis not present

## 2021-09-13 DIAGNOSIS — G35 Multiple sclerosis: Secondary | ICD-10-CM | POA: Diagnosis not present

## 2021-09-14 DIAGNOSIS — I10 Essential (primary) hypertension: Secondary | ICD-10-CM | POA: Diagnosis not present

## 2021-09-14 DIAGNOSIS — A4189 Other specified sepsis: Secondary | ICD-10-CM | POA: Diagnosis not present

## 2021-09-14 DIAGNOSIS — N399 Disorder of urinary system, unspecified: Secondary | ICD-10-CM | POA: Diagnosis not present

## 2021-09-14 DIAGNOSIS — G35 Multiple sclerosis: Secondary | ICD-10-CM | POA: Diagnosis not present

## 2021-09-14 DIAGNOSIS — G259 Extrapyramidal and movement disorder, unspecified: Secondary | ICD-10-CM | POA: Diagnosis not present

## 2021-09-14 DIAGNOSIS — G8114 Spastic hemiplegia affecting left nondominant side: Secondary | ICD-10-CM | POA: Diagnosis not present

## 2021-09-16 DIAGNOSIS — G35 Multiple sclerosis: Secondary | ICD-10-CM | POA: Diagnosis not present

## 2021-09-16 DIAGNOSIS — N399 Disorder of urinary system, unspecified: Secondary | ICD-10-CM | POA: Diagnosis not present

## 2021-09-16 DIAGNOSIS — I10 Essential (primary) hypertension: Secondary | ICD-10-CM | POA: Diagnosis not present

## 2021-09-16 DIAGNOSIS — A4189 Other specified sepsis: Secondary | ICD-10-CM | POA: Diagnosis not present

## 2021-09-16 DIAGNOSIS — G8114 Spastic hemiplegia affecting left nondominant side: Secondary | ICD-10-CM | POA: Diagnosis not present

## 2021-09-16 DIAGNOSIS — G259 Extrapyramidal and movement disorder, unspecified: Secondary | ICD-10-CM | POA: Diagnosis not present

## 2021-09-19 DIAGNOSIS — G35 Multiple sclerosis: Secondary | ICD-10-CM | POA: Diagnosis not present

## 2021-09-19 DIAGNOSIS — G259 Extrapyramidal and movement disorder, unspecified: Secondary | ICD-10-CM | POA: Diagnosis not present

## 2021-09-19 DIAGNOSIS — N399 Disorder of urinary system, unspecified: Secondary | ICD-10-CM | POA: Diagnosis not present

## 2021-09-19 DIAGNOSIS — I10 Essential (primary) hypertension: Secondary | ICD-10-CM | POA: Diagnosis not present

## 2021-09-19 DIAGNOSIS — A4189 Other specified sepsis: Secondary | ICD-10-CM | POA: Diagnosis not present

## 2021-09-19 DIAGNOSIS — G8114 Spastic hemiplegia affecting left nondominant side: Secondary | ICD-10-CM | POA: Diagnosis not present

## 2021-09-20 DIAGNOSIS — I10 Essential (primary) hypertension: Secondary | ICD-10-CM | POA: Diagnosis not present

## 2021-09-20 DIAGNOSIS — G8114 Spastic hemiplegia affecting left nondominant side: Secondary | ICD-10-CM | POA: Diagnosis not present

## 2021-09-20 DIAGNOSIS — N399 Disorder of urinary system, unspecified: Secondary | ICD-10-CM | POA: Diagnosis not present

## 2021-09-20 DIAGNOSIS — A4189 Other specified sepsis: Secondary | ICD-10-CM | POA: Diagnosis not present

## 2021-09-20 DIAGNOSIS — G259 Extrapyramidal and movement disorder, unspecified: Secondary | ICD-10-CM | POA: Diagnosis not present

## 2021-09-20 DIAGNOSIS — G35 Multiple sclerosis: Secondary | ICD-10-CM | POA: Diagnosis not present

## 2021-09-26 DIAGNOSIS — G259 Extrapyramidal and movement disorder, unspecified: Secondary | ICD-10-CM | POA: Diagnosis not present

## 2021-09-26 DIAGNOSIS — A4189 Other specified sepsis: Secondary | ICD-10-CM | POA: Diagnosis not present

## 2021-09-26 DIAGNOSIS — N399 Disorder of urinary system, unspecified: Secondary | ICD-10-CM | POA: Diagnosis not present

## 2021-09-26 DIAGNOSIS — G8114 Spastic hemiplegia affecting left nondominant side: Secondary | ICD-10-CM | POA: Diagnosis not present

## 2021-09-26 DIAGNOSIS — I10 Essential (primary) hypertension: Secondary | ICD-10-CM | POA: Diagnosis not present

## 2021-09-26 DIAGNOSIS — G35 Multiple sclerosis: Secondary | ICD-10-CM | POA: Diagnosis not present

## 2021-09-27 DIAGNOSIS — I10 Essential (primary) hypertension: Secondary | ICD-10-CM | POA: Diagnosis not present

## 2021-09-27 DIAGNOSIS — G259 Extrapyramidal and movement disorder, unspecified: Secondary | ICD-10-CM | POA: Diagnosis not present

## 2021-09-27 DIAGNOSIS — G35 Multiple sclerosis: Secondary | ICD-10-CM | POA: Diagnosis not present

## 2021-09-27 DIAGNOSIS — N399 Disorder of urinary system, unspecified: Secondary | ICD-10-CM | POA: Diagnosis not present

## 2021-09-27 DIAGNOSIS — G8114 Spastic hemiplegia affecting left nondominant side: Secondary | ICD-10-CM | POA: Diagnosis not present

## 2021-09-27 DIAGNOSIS — A4189 Other specified sepsis: Secondary | ICD-10-CM | POA: Diagnosis not present

## 2021-10-03 DIAGNOSIS — G8114 Spastic hemiplegia affecting left nondominant side: Secondary | ICD-10-CM | POA: Diagnosis not present

## 2021-10-03 DIAGNOSIS — A4189 Other specified sepsis: Secondary | ICD-10-CM | POA: Diagnosis not present

## 2021-10-03 DIAGNOSIS — G259 Extrapyramidal and movement disorder, unspecified: Secondary | ICD-10-CM | POA: Diagnosis not present

## 2021-10-03 DIAGNOSIS — G35 Multiple sclerosis: Secondary | ICD-10-CM | POA: Diagnosis not present

## 2021-10-03 DIAGNOSIS — N399 Disorder of urinary system, unspecified: Secondary | ICD-10-CM | POA: Diagnosis not present

## 2021-10-03 DIAGNOSIS — I10 Essential (primary) hypertension: Secondary | ICD-10-CM | POA: Diagnosis not present

## 2021-10-11 DIAGNOSIS — M6281 Muscle weakness (generalized): Secondary | ICD-10-CM | POA: Diagnosis not present

## 2021-10-11 DIAGNOSIS — G35 Multiple sclerosis: Secondary | ICD-10-CM | POA: Diagnosis not present

## 2021-10-11 DIAGNOSIS — I10 Essential (primary) hypertension: Secondary | ICD-10-CM | POA: Diagnosis not present

## 2021-10-17 ENCOUNTER — Other Ambulatory Visit: Payer: Self-pay | Admitting: Neurology

## 2021-10-25 ENCOUNTER — Telehealth: Payer: Self-pay | Admitting: Family Medicine

## 2021-10-25 NOTE — Telephone Encounter (Signed)
Was able to refax the forms to Aram Beecham at the fax number that was provided. Received confirmation that it went through ?

## 2021-10-25 NOTE — Telephone Encounter (Signed)
Aram Beecham from [employer] called stating that the Rx that was sent for a wheelchair for the pt has the year of 2027. Aram Beecham is needing the provider to put a line through 2027 and put the correct date of 10/06/21 and initial and resend so that they can get the wheelchair to the pt. Please fax fixed Rx to [phone] and use Cynthia's cover sheet that has the Barcode.  ?

## 2021-10-27 ENCOUNTER — Other Ambulatory Visit: Payer: Self-pay | Admitting: Neurology

## 2021-11-07 ENCOUNTER — Other Ambulatory Visit: Payer: Self-pay | Admitting: Neurology

## 2021-11-17 ENCOUNTER — Other Ambulatory Visit: Payer: Self-pay | Admitting: Neurology

## 2021-11-17 NOTE — Telephone Encounter (Signed)
Last OV was on 08/02/21.  ?Next OV is scheduled for 02/02/22.  ?Last RX was written on 10/17/21 for 30 tabs.  ? ?[place] [facility] Database has been reviewed.  ?

## 2021-11-24 DIAGNOSIS — D649 Anemia, unspecified: Secondary | ICD-10-CM | POA: Diagnosis not present

## 2021-11-24 DIAGNOSIS — Z79899 Other long term (current) drug therapy: Secondary | ICD-10-CM | POA: Diagnosis not present

## 2021-11-24 DIAGNOSIS — J45901 Unspecified asthma with (acute) exacerbation: Secondary | ICD-10-CM | POA: Diagnosis not present

## 2021-11-24 DIAGNOSIS — F419 Anxiety disorder, unspecified: Secondary | ICD-10-CM | POA: Diagnosis not present

## 2021-11-24 DIAGNOSIS — R600 Localized edema: Secondary | ICD-10-CM | POA: Diagnosis not present

## 2021-11-24 DIAGNOSIS — J3489 Other specified disorders of nose and nasal sinuses: Secondary | ICD-10-CM | POA: Insufficient documentation

## 2021-11-24 DIAGNOSIS — R0602 Shortness of breath: Principal | ICD-10-CM | POA: Insufficient documentation

## 2021-11-24 DIAGNOSIS — Z87891 Personal history of nicotine dependence: Secondary | ICD-10-CM | POA: Insufficient documentation

## 2021-11-24 DIAGNOSIS — R9431 Abnormal electrocardiogram [ECG] [EKG]: Secondary | ICD-10-CM | POA: Diagnosis not present

## 2021-11-24 DIAGNOSIS — R002 Palpitations: Secondary | ICD-10-CM | POA: Diagnosis not present

## 2021-11-24 DIAGNOSIS — I10 Essential (primary) hypertension: Secondary | ICD-10-CM | POA: Diagnosis not present

## 2021-11-24 DIAGNOSIS — E876 Hypokalemia: Secondary | ICD-10-CM | POA: Insufficient documentation

## 2021-11-24 DIAGNOSIS — G8114 Spastic hemiplegia affecting left nondominant side: Secondary | ICD-10-CM | POA: Insufficient documentation

## 2021-11-24 DIAGNOSIS — R0601 Orthopnea: Secondary | ICD-10-CM | POA: Diagnosis not present

## 2021-11-24 DIAGNOSIS — R000 Tachycardia, unspecified: Secondary | ICD-10-CM | POA: Diagnosis not present

## 2021-11-24 DIAGNOSIS — J209 Acute bronchitis, unspecified: Secondary | ICD-10-CM | POA: Diagnosis not present

## 2021-11-24 DIAGNOSIS — R519 Headache, unspecified: Secondary | ICD-10-CM | POA: Diagnosis not present

## 2021-11-24 DIAGNOSIS — G35 Multiple sclerosis: Secondary | ICD-10-CM | POA: Diagnosis not present

## 2021-11-24 DIAGNOSIS — Z825 Family history of asthma and other chronic lower respiratory diseases: Secondary | ICD-10-CM | POA: Insufficient documentation

## 2021-11-24 DIAGNOSIS — R058 Other specified cough: Secondary | ICD-10-CM | POA: Diagnosis not present

## 2021-11-24 DIAGNOSIS — Z840 Family history of diseases of the skin and subcutaneous tissue: Secondary | ICD-10-CM | POA: Diagnosis not present

## 2021-11-24 DIAGNOSIS — Z20822 Contact with and (suspected) exposure to covid-19: Secondary | ICD-10-CM | POA: Insufficient documentation

## 2021-11-24 DIAGNOSIS — R7309 Other abnormal glucose: Secondary | ICD-10-CM | POA: Diagnosis not present

## 2021-11-24 DIAGNOSIS — D72829 Elevated white blood cell count, unspecified: Secondary | ICD-10-CM | POA: Insufficient documentation

## 2021-11-24 DIAGNOSIS — Z8673 Personal history of transient ischemic attack (TIA), and cerebral infarction without residual deficits: Secondary | ICD-10-CM | POA: Insufficient documentation

## 2021-11-25 ENCOUNTER — Other Ambulatory Visit: Payer: Self-pay

## 2021-11-25 ENCOUNTER — Emergency Department (HOSPITAL_COMMUNITY): Payer: Medicare Other

## 2021-11-25 ENCOUNTER — Observation Stay (HOSPITAL_COMMUNITY)
Admission: EM | Admit: 2021-11-25 | Discharge: 2021-11-26 | Disposition: A | Discharge status: Home / Self Care | Payer: Medicare Other | Attending: Internal Medicine | Admitting: Internal Medicine

## 2021-11-25 ENCOUNTER — Encounter (HOSPITAL_COMMUNITY): Payer: Self-pay | Admitting: Emergency Medicine

## 2021-11-25 DIAGNOSIS — R600 Localized edema: Secondary | ICD-10-CM | POA: Diagnosis present

## 2021-11-25 DIAGNOSIS — J45901 Unspecified asthma with (acute) exacerbation: Secondary | ICD-10-CM | POA: Diagnosis present

## 2021-11-25 DIAGNOSIS — G35 Multiple sclerosis: Secondary | ICD-10-CM | POA: Diagnosis present

## 2021-11-25 DIAGNOSIS — J209 Acute bronchitis, unspecified: Secondary | ICD-10-CM

## 2021-11-25 DIAGNOSIS — J4521 Mild intermittent asthma with (acute) exacerbation: Secondary | ICD-10-CM

## 2021-11-25 DIAGNOSIS — R000 Tachycardia, unspecified: Secondary | ICD-10-CM | POA: Diagnosis present

## 2021-11-25 DIAGNOSIS — R0602 Shortness of breath: Secondary | ICD-10-CM | POA: Diagnosis not present

## 2021-11-25 DIAGNOSIS — E876 Hypokalemia: Secondary | ICD-10-CM

## 2021-11-25 DIAGNOSIS — I10 Essential (primary) hypertension: Secondary | ICD-10-CM

## 2021-11-25 LAB — BASIC METABOLIC PANEL
Anion gap: 9 (ref 5–15)
BUN: <5 mg/dL — ABNORMAL LOW (ref 6–20)
CO2: 22 mmol/L (ref 22–32)
Calcium: 9 mg/dL (ref 8.9–10.3)
Chloride: 105 mmol/L (ref 98–111)
Creatinine, Ser: 0.66 mg/dL (ref 0.44–1.00)
GFR, Estimated: >60 mL/min (ref >60)
Glucose, Bld: 139 mg/dL — ABNORMAL HIGH (ref 70–99)
Potassium: 3.1 mmol/L — ABNORMAL LOW (ref 3.5–5.1)
Sodium: 136 mmol/L (ref 135–145)

## 2021-11-25 LAB — RESPIRATORY PANEL BY PCR

## 2021-11-25 LAB — CBC WITH DIFFERENTIAL/PLATELET
Abs Immature Granulocytes: 0.03 10*3/uL (ref 0.00–0.07)
Basophils Absolute: 0 10*3/uL (ref 0.0–0.1)
Basophils Relative: 0 %
Eosinophils Absolute: 0.4 10*3/uL (ref 0.0–0.5)
Eosinophils Relative: 4 %
HCT: 36.3 % (ref 36.0–46.0)
Hemoglobin: 11.8 g/dL — ABNORMAL LOW (ref 12.0–15.0)
Immature Granulocytes: 0 %
Lymphocytes Relative: 9 %
Lymphs Abs: 1.1 10*3/uL (ref 0.7–4.0)
MCH: 27.1 pg (ref 26.0–34.0)
MCHC: 32.5 g/dL (ref 30.0–36.0)
MCV: 83.4 fL (ref 80.0–100.0)
Monocytes Absolute: 0.8 10*3/uL (ref 0.1–1.0)
Monocytes Relative: 6 %
Neutro Abs: 10 10*3/uL — ABNORMAL HIGH (ref 1.7–7.7)
Neutrophils Relative %: 81 %
Platelets: 320 10*3/uL (ref 150–400)
RBC: 4.35 MIL/uL (ref 3.87–5.11)
RDW: 14.1 % (ref 11.5–15.5)
WBC: 12.4 10*3/uL — ABNORMAL HIGH (ref 4.0–10.5)
nRBC: 0 % (ref 0.0–0.2)

## 2021-11-25 LAB — RESP PANEL BY RT-PCR (FLU A&B, COVID) ARPGX2
Influenza A by PCR: NEGATIVE
Influenza B by PCR: NEGATIVE
SARS Coronavirus 2 by RT PCR: NEGATIVE

## 2021-11-25 LAB — D-DIMER, QUANTITATIVE: D-Dimer, Quant: 0.45 ug/mL-FEU (ref 0.00–0.50)

## 2021-11-25 LAB — I-STAT BETA HCG BLOOD, ED (MC, WL, AP ONLY): I-stat hCG, quantitative: <5 m[IU]/mL (ref <5)

## 2021-11-25 LAB — PROCALCITONIN: Procalcitonin: <0.1 ng/mL

## 2021-11-25 IMAGING — CR DG CHEST 2V
2 series · 2 of 2 positions shown · non-contrast
Comparison: [DATE]

CLINICAL DATA: Shortness of breath

EXAM:
CHEST - 2 VIEW

[chest lat]
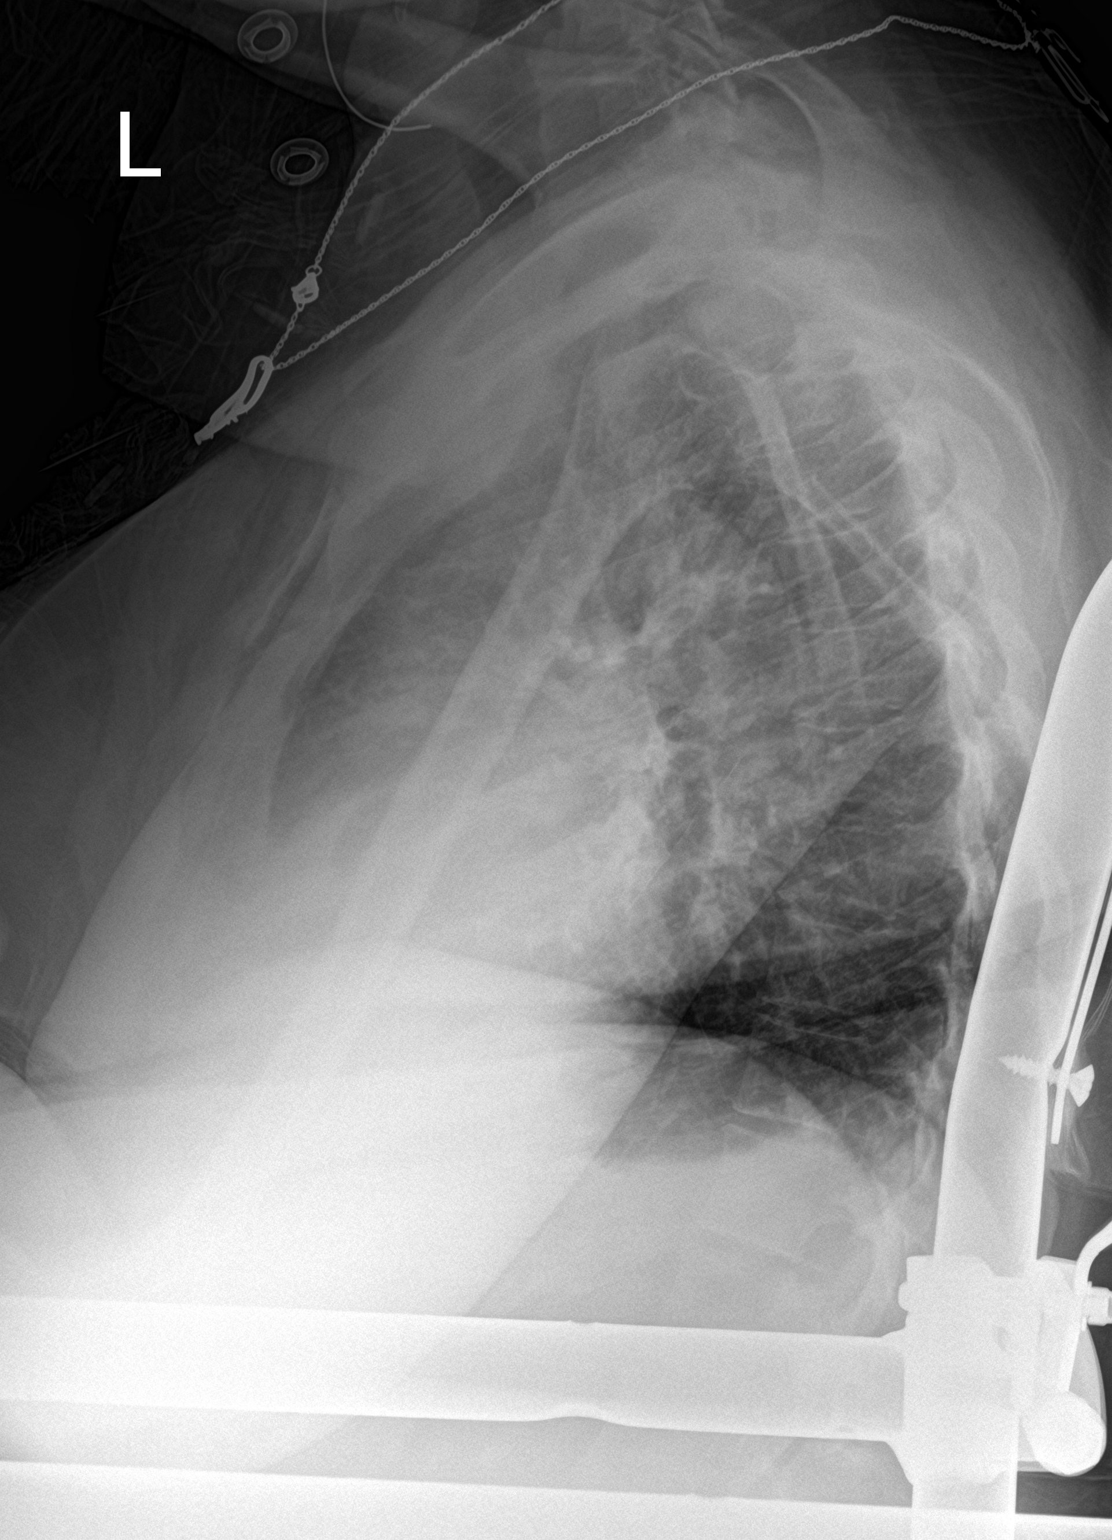

[chest ap]
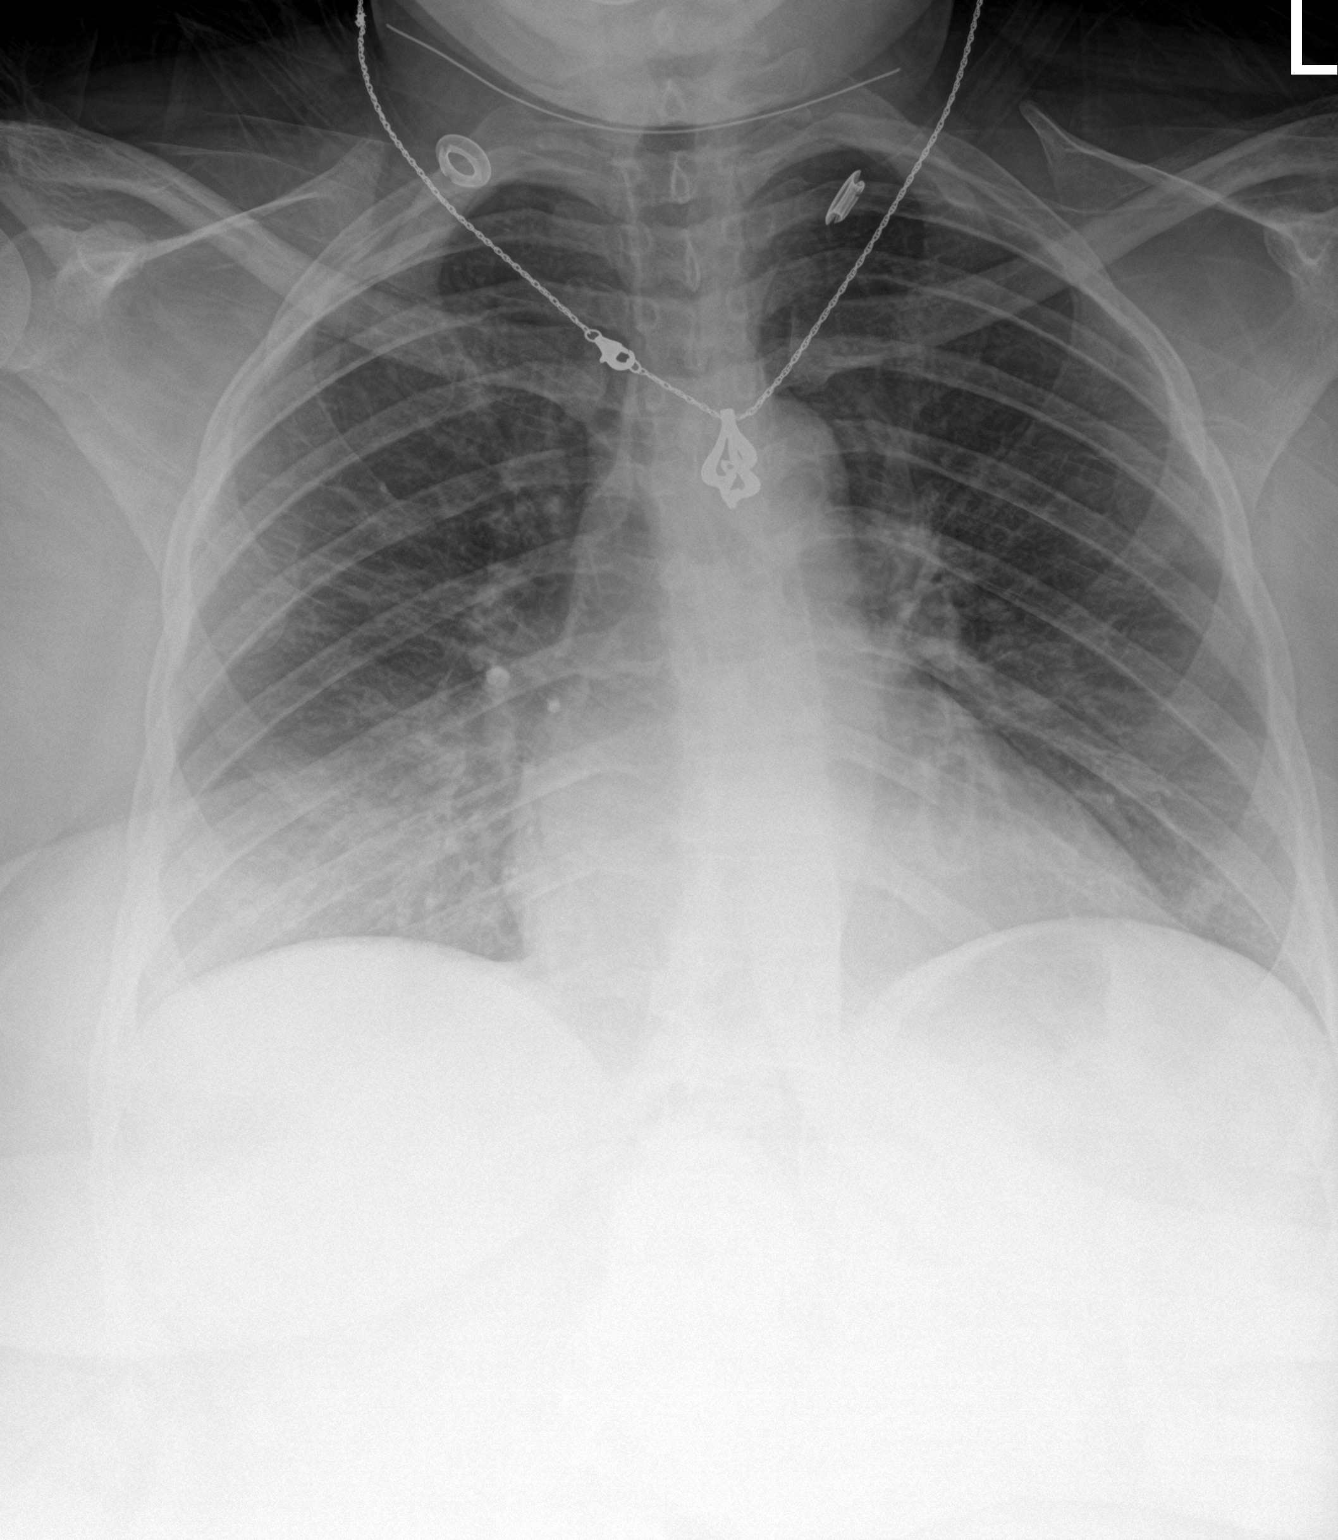

[2 of 2 positions shown; findings below may reference images not displayed]

FINDINGS: Cardiac shadow is within normal limits. The lungs are clear
bilaterally. No acute bony abnormality is seen.
IMPRESSION: No active cardiopulmonary disease.

## 2021-11-25 MED ORDER — IPRATROPIUM-ALBUTEROL 0.5-2.5 (3) MG/3ML IN SOLN
3.0000 mL | RESPIRATORY_TRACT | Status: DC
  Administered 2021-11-25 (×2): 3 mL via RESPIRATORY_TRACT
  Filled 2021-11-25 (×2): qty 3

## 2021-11-25 MED ORDER — ACETAMINOPHEN 325 MG PO TABS: 650.0000 mg | ORAL_TABLET | Freq: Four times a day (QID) | ORAL | Status: DC | PRN

## 2021-11-25 MED ORDER — POTASSIUM CHLORIDE CRYS ER 20 MEQ PO TBCR
40.0000 meq | EXTENDED_RELEASE_TABLET | Freq: Once | ORAL | Status: AC
  Administered 2021-11-25: 40 meq via ORAL
  Filled 2021-11-25: qty 2

## 2021-11-25 MED ORDER — BACLOFEN 10 MG PO TABS
10.0000 mg | ORAL_TABLET | Freq: Four times a day (QID) | ORAL | Status: DC | PRN
  Administered 2021-11-25: 10 mg via ORAL
  Filled 2021-11-25: qty 1

## 2021-11-25 MED ORDER — HYDROCHLOROTHIAZIDE 25 MG PO TABS
25.0000 mg | ORAL_TABLET | Freq: Every day | ORAL | Status: DC
Start: 2021-11-25 — End: 2021-11-26
  Administered 2021-11-25 – 2021-11-26 (×2): 25 mg via ORAL
  Filled 2021-11-25 (×2): qty 1

## 2021-11-25 MED ORDER — MAGNESIUM SULFATE 2 GM/50ML IV SOLN
2.0000 g | Freq: Once | INTRAVENOUS | Status: AC
  Administered 2021-11-25: 2 g via INTRAVENOUS
  Filled 2021-11-25: qty 50

## 2021-11-25 MED ORDER — PHENTERMINE HCL 37.5 MG PO CAPS: 37.5000 mg | ORAL_CAPSULE | Freq: Every day | ORAL | Status: DC

## 2021-11-25 MED ORDER — IPRATROPIUM-ALBUTEROL 0.5-2.5 (3) MG/3ML IN SOLN
3.0000 mL | Freq: Once | RESPIRATORY_TRACT | Status: AC
  Administered 2021-11-25: 3 mL via RESPIRATORY_TRACT
  Filled 2021-11-25: qty 3

## 2021-11-25 MED ORDER — PREDNISONE 20 MG PO TABS
40.0000 mg | ORAL_TABLET | Freq: Every day | ORAL | Status: DC
  Administered 2021-11-26: 40 mg via ORAL
  Filled 2021-11-25: qty 2

## 2021-11-25 MED ORDER — ACETAMINOPHEN 650 MG RE SUPP: 650.0000 mg | Freq: Four times a day (QID) | RECTAL | Status: DC | PRN

## 2021-11-25 MED ORDER — IPRATROPIUM-ALBUTEROL 0.5-2.5 (3) MG/3ML IN SOLN
3.0000 mL | Freq: Three times a day (TID) | RESPIRATORY_TRACT | Status: DC
  Administered 2021-11-25 – 2021-11-26 (×3): 3 mL via RESPIRATORY_TRACT
  Filled 2021-11-25 (×3): qty 3

## 2021-11-25 MED ORDER — SODIUM CHLORIDE 0.9% FLUSH
3.0000 mL | Freq: Two times a day (BID) | INTRAVENOUS | Status: DC
  Administered 2021-11-25 – 2021-11-26 (×3): 3 mL via INTRAVENOUS

## 2021-11-25 MED ORDER — RIVAROXABAN 10 MG PO TABS
10.0000 mg | ORAL_TABLET | Freq: Every day | ORAL | Status: DC
  Administered 2021-11-25 – 2021-11-26 (×2): 10 mg via ORAL
  Filled 2021-11-25 (×2): qty 1

## 2021-11-25 MED ORDER — ALBUTEROL SULFATE HFA 108 (90 BASE) MCG/ACT IN AERS
2.0000 | INHALATION_SPRAY | Freq: Once | RESPIRATORY_TRACT | Status: AC
  Administered 2021-11-25: 2 via RESPIRATORY_TRACT
  Filled 2021-11-25: qty 6.7

## 2021-11-25 MED ORDER — PREDNISONE 20 MG PO TABS
60.0000 mg | ORAL_TABLET | Freq: Once | ORAL | Status: AC
Start: 2021-11-25 — End: 2021-11-25
  Administered 2021-11-25: 60 mg via ORAL
  Filled 2021-11-25: qty 3

## 2021-11-25 MED ORDER — DALFAMPRIDINE ER 10 MG PO TB12: 10.0000 mg | ORAL_TABLET | Freq: Two times a day (BID) | ORAL | Status: DC

## 2021-11-25 NOTE — ED Notes (Signed)
Report given to Lew Dawes, RN ?

## 2021-11-25 NOTE — Hospital Course (Addendum)
Lindsay Schneider is a 36 year old female with a history of multiple sclerosis and hypertension who presents with new onset shortness of breath following a viral infection, consistent with asthma exacerbation.  ? ?Asthma exacerbation ?Patient presented with significant shortness of breath following a recent viral illness. Etiology most likely asthma given strong family history of asthma, history of chest tightness with exercise, wheezing on exam, and improvement with albuterol. CXR clear. WBC on admission 0000000 with neutrophilic predominance. Procalcitonin low. Respiratory pathogen panel negative. Presentation not consistent with pneumonia. She was treated with albuterol and duo-nebs, with significant symptomatic relief. She was also given a single dose of IV magnesium sulfate and started on a 5 day course of prednisone. She will need outpatient follow up and PFTs for a formal diagnosis of asthma.  ? ?Sinus tachycardia ?Patient found to be in sinus tachycardia on admission EKG with heart rate 131. Prior EKG also notable for sinus tachycardia so could be chronic. Tachycardia worsened with albuterol administration but was present before beta agonist use, concerning for underlying etiology. Highest on the differential is anxiety and an acute infectious state given that patient is still endorsing viral symptoms. Also possibly secondary to phentermine use. Hyperthyroidism and pulmonary embolus ruled out with TSH and d-dimer. Patient appeared euvolemic on exam so likely not due to dehydration. No chronic beta blocker or alcohol use to suggest acute withdrawal. Patient will require outpatient monitoring and further workup. ? ?Lower extremity edema ?Patient noted to have mild bilateral pitting edema initially, improved during admission. Likely secondary to venous stasis. She does endorse orthopnea, concerning for CHF but BNP was low and there were no other signs of heart failure on exam. We recommend compression stockings as  needed for comfort.  ? ?No other changes were made to chronic medications.  ? ?

## 2021-11-25 NOTE — Progress Notes (Addendum)
?   11/25/21 1554  ?Vitals  ?Temp 98.5 ?F (36.9 ?C)  ?Temp Source Oral  ?BP (!) 151/95  ?MAP (mmHg) 112  ?BP Location Right Arm  ?BP Method Automatic  ?Patient Position (if appropriate) Lying  ?Pulse Rate (!) 137  ?Pulse Rate Source Monitor  ?ECG Heart Rate (!) 136  ?Resp 14  ?Level of Consciousness  ?Level of Consciousness Alert  ?MEWS COLOR  ?MEWS Score Color Yellow  ?Oxygen Therapy  ?SpO2 98 %  ?O2 Device Room Air  ?Pain Assessment  ?Pain Scale 0-10  ?Pain Score 0  ?PCA/Epidural/Spinal Assessment  ?Respiratory Pattern Regular;Unlabored  ?Glasgow Coma Scale  ?Eye Opening 4  ?Best Verbal Response (NON-intubated) 5  ?Best Motor Response 6  ?Glasgow Coma Scale Score 15  ?MEWS Score  ?MEWS Temp 0  ?MEWS Systolic 0  ?MEWS Pulse 3  ?MEWS RR 0  ?MEWS LOC 0  ?MEWS Score 3  ? ? Patient admitted to 3e from ED with elevated HR. Elevated HR is due to nebulizers adminstered for shortness of breathe. Mullens MD made aware. MEWS protocol initiated. ?

## 2021-11-25 NOTE — ED Provider Triage Note (Signed)
Emergency Medicine Provider Triage Evaluation Note ? ?Lindsay Schneider , a 36 y.o. female  was evaluated in triage.  Pt complains of shortness of breath.  Patient endorses cough and viral symptoms starting 1 week ago, started feeling short of breath 2 days ago and it has been worsening since then.  Endorses wheezing, denies any chest pain.  Not on any oral birth controls, no history of PE, denies any chest pain or lower extremity swelling.  She states she does not have a history of asthma but everybody in her family has it.. ? ?Review of Systems  ?Per HPI ? ?Physical Exam  ?BP (!) 161/127 (BP Location: Right Arm)   Pulse (!) 137   Temp 98.2 ?F (36.8 ?C) (Oral)   Resp 18   SpO2 99%  ?Gen:   Awake, no distress   ?Resp:  Normal effort.  Patient is speaking complete sentences, diffuse inspiratory and expiratory wheezing ?MSK:   Moves extremities without difficulty  ?Other:  Patient is tachycardic to the 130s, regular rhythm. ? ?Medical Decision Making  ?Medically screening exam initiated at 12:33 AM.  Appropriate orders placed.  Lindsay Schneider was informed that the remainder of the evaluation will be completed by another provider, this initial triage assessment does not replace that evaluation, and the importance of remaining in the ED until their evaluation is complete. ? ?Ordered albuterol inhaler due to the wheezing.  Will get chest x-ray and labs as no documented history of asthma.  Considered PE but patient does not have any chest pain, is not hypoxic and does not have any risk factors for development of PE.   ?  ?Theron Arista, PA-C ?11/25/21 0035 ? ?

## 2021-11-25 NOTE — ED Notes (Signed)
Put in for transport  to go to 3E-16 ?

## 2021-11-25 NOTE — ED Notes (Signed)
IV attempt x 3 not successful. ?

## 2021-11-25 NOTE — ED Notes (Signed)
Informed RN of monitor alerts ?

## 2021-11-25 NOTE — ED Triage Notes (Signed)
Pt reported to ED with c/o shortness of breath and cough x2 days. Denies any chest pain.  ?

## 2021-11-25 NOTE — Plan of Care (Signed)
  Problem: Education: Goal: Knowledge of General Education information will improve Description Including pain rating scale, medication(s)/side effects and non-pharmacologic comfort measures Outcome: Progressing   

## 2021-11-25 NOTE — H&P (Signed)
?Date: 11/25/2021     ?     ?     ?Patient Name:  Lindsay Schneider MRN: [id number]  ?DOB: [birth year] Age / Sex: 36 y.o., female   ?PCP: Patient, No Pcp Per (Inactive)    ?     ?     ?Medical Service: Internal Medicine Teaching Service    ?     ?     ?Attending Physician: Dr. Inez Catalina, MD    ?First Contact: Thompson Caul, MS 4 Pager: [phone]  ?Second Contact: Dr. Kirke Corin Pager: [phone]  ?     ?     ?After Hours (After 5p/  First Contact Pager: [phone]  ?weekends / holidays): Second Contact Pager: [phone]  ? ?Chief Complaint: shortness of breath ? ?History of Present Illness: Mrs. Schexnider is a 36 year old female with a history of multiple sclerosis who presents with shortness of breath.  ? ?She reports having a viral illness over the past two weeks, with cough productive of clear-yellow sputum, headache, rhinorrhea, sneezing, and shortness of breath. She has been using OTC delsym and theraflu with mild relief. Reports decreased energy and decreased appetite with the recent illness but has been able to stay hydrated and eat some fruits. Denies fevers, chills, lacrimation, eye redness / itchiness,  changes in hearing, or palpitations. Denies sick contacts or recent travel. Today, her shortness of breath worsened significantly, prompting her to present for evaluation.  ? ?She denies any prior episodes similar to this one. She does experience shortness of breath and chest tightness with prolonged running but has never been diagnosed with asthma or required albuterol. Denies any history of muscle weakness resulting in difficulty breathing from MS. She has tolerated prior viral illnesses without severe SOB requiring hospitalization.  ? ?She also feels "heavy" with fluid in her legs. Endorses orthopnea and reports using 2-3 pillows to sleep for the past several years. She has never been diagnosed with any heart conditions or used a diuretic.  ? ?In the ED, she was given albuterol x1, duonebs x2, and prednisone 60 mg  with significant symptomatic relief.  ? ?Meds: ?Baclofen 10 mg PRN ?Phentermine 37.5 mg daily ?Dalfampridine 10 mg BID ?Hydrochlorothiazide 25 mg daily  ? ?Allergies: NKDA ? ?Past Medical History ?Multiple sclerosis - followed by Dr.Sater at [facility]. Diagnosed 13 years ago ? ?Past Medical History:  ?Diagnosis Date  ? Movement disorder   ? MS (multiple sclerosis) (HCC)   ? ?Family History:  ?Mom - asthma, eczema ?Brother - asthma, eczema ?Daughter and son - asthma  ? ?Social History: Patient lives in a house in [place] with her boyfriend and children. She does not work. Did smoke heavily in high school but quit when she graduated and is not using tobacco products currently. Denies any use of alcohol or recreational drugs. Endorses being sexually active currently.  ? ?Review of Systems: ?A complete ROS was negative except as per HPI.  ? ?Physical Exam: ?Blood pressure (!) 147/102, pulse (!) 131, temperature (!) 97 ?F (36.1 ?C), resp. rate 15, SpO2 97 %. ?General: Well appearing and in no acute distress, lying in bed comfortably ?Neuro: A&O x3, normal affect ?HEENT: Normocephalic, atraumatic, EOMI, normal sclerae without scleral icterus or injection. Moist mucous membranes. Clear oropharynx ?Cardiovascular: Tachycardic but no murmurs, rubs, or gallops. Normal S1 and S2 without S3 or S4. Pulses 2+ in bilateral UE and LE. No JVD. 1+ pitting edema to mid calf bilaterally.  ?Pulmonary: Breathing comfortably on room  air. Inspiratory and expiratory wheezing heard throughout but loudest at bilateral bases.  ?Abdominal: Abdomen soft and non-distended. Normal bowel sounds in all four quadrants. No tenderness to palpation, guarding, or rebound tenderness ?Skin: Warm and dry with no rashes, cuts, or bruises ?MSK: Normal ROM of all extremities. Strength 5/5 in bilateral UE and LE ? ?EKG: personally reviewed my interpretation is sinus tachycardia with T wave inversions in v4-6. Similar to prior.  ? ?CXR: personally  reviewed my interpretation is clear lungs, no cardiomegaly  ? ?Assessment & Plan by Problem: ?Principal Problem: ?  Asthma exacerbation ? ?This is a 36 year old female with a history of multiple sclerosis who presents for evaluation of new onset shortness of breath following a viral illness, now found to have asthma. ? ?Asthma exacerbation ?Patient presenting with significant shortness of breath following a recent viral illness. Etiology most likely asthma given strong family history of asthma, history of chest tightness with exercise, wheezing on exam, and improvement with albuterol. CXR clear. WBC 12.4 with neutrophilic predominance. Procalcitonin low. Respiratory pathogen panel negative. Low concern for pneumonia. ?- continue albuterol and duonebs every 4 hours PRN ?- IV magnesium 2mg  x1  ?- prednisone 40 mg daily x 5 days ?- May need to consider outpatient PFTs once over acute exacerbation ? ?Sinus tachycardia ?Patient found to be in sinus tachycardia on admission EKG. Appears chronic, stable from prior EKG. Tachycardia exacerbated with albuterol but was present on admission prior to beta agonist use. Possible underlying hyperthyroidism given feelings of anxiety and palpitations. Lower concern for PE but will consider given shortness of breath. She endorses good PO intake and hydration and appears euvolemic on exam, less likely to be contributing to her tachycardia. She does not have chronic beta blocker use or alcohol use, so withdrawal is less likely on differential. Afebrile and low concern for infection at this time. ?- TSH ?- D dimer ?- Continue cardiac monitoring ? ?Lower extremity edema ?Patient noted to have bilateral lower extremity edema on exam and also endorsed subjective "heaviness" and weight gain with orthopnea of 3-4 pillows for several years. Concern for heart failure given these symptoms; however, does not appear to be in acute heart failure exacerbation at this time.  ?- follow up outpatient  for echo ?- compression stockings  ? ?Multiple sclerosis ?- continue home baclofen, dalfampridine, and phentermine  ? ?Hypertension ?- continue home HCTZ ? ?Hypokalemia ?- replete PRN ?- Trend BMP  ? ?DVT prophylaxis  ?- rivaroxaban  ? ?Dispo: Admit patient to Observation with expected length of stay less than 2 midnights. ? ?Signed: ? , MS4 ?Thompson Caul, Medical Student ?11/25/2021, 11:52 AM  ?Pager: [phone] ? ?Attestation for Student Documentation: ? ?I personally was present and performed or re-performed the history, physical exam and medical decision-making activities of this service and have verified that the service and findings are accurately documented in the student?s note. ? 02-01-2006, MD ?[employer] PGY-3 ?11/25/2021, 1:52 PM ? ?

## 2021-11-25 NOTE — ED Provider Notes (Signed)
?[facility] ?Provider Note ? ? ?CSN: [id number] ?Arrival date & time: 11/24/21  2358 ? ?  ? ?History ? ?Chief Complaint  ?Patient presents with  ? Shortness of Breath  ? ? ?Lindsay Schneider is a 36 y.o. female. ? ?The history is provided by the patient.  ?Shortness of Breath ?She has history of multiple sclerosis with spastic left hemiplegia and comes in with a 2-day history of cough productive of small amount of clear sputum and shortness of breath.  Symptoms have been getting worse.  She denies fever, chills, sweats.  She denies any chest pain, heaviness, tightness, pressure.  She denies any sick contacts.  She did take a home COVID test which was negative. ?  ?Home Medications ?Prior to Admission medications   ?Medication Sig Start Date End Date Taking? Authorizing Provider  ?baclofen (LIORESAL) 10 MG tablet TAKE 1 TO 2 TABLETS 4 TIMES A DAY 08/29/21   Sater, Nanine Means, MD  ?dalfampridine 10 MG TB12 TAKE 1 TABLET BY MOUTH 2 TIMES A DAY (APPROXIMATELY 12 HOURS APART) 08/09/21   Lomax, Amy, NP  ?hydrochlorothiazide (HYDRODIURIL) 25 MG tablet Take 1 tablet (25 mg total) by mouth daily. 01/25/21   Sater, Nanine Means, MD  ?oxybutynin (DITROPAN) 5 MG tablet Take 1 tablet (5 mg total) by mouth 2 (two) times daily. 09/20/18   Sater, Nanine Means, MD  ?phentermine 37.5 MG capsule TAKE 1 CAPSULE BY MOUTH EVERY DAY IN THE MORNING 11/17/21   Sater, Nanine Means, MD  ?tiZANidine (ZANAFLEX) 4 MG tablet 1/2 to 1 pill po tid 08/02/21   Lomax, Amy, NP  ?triamcinolone (KENALOG) 0.025 % ointment Apply 1 application topically 2 (two) times daily as needed (rash).  01/25/18   [provider]  ?Vitamin D, Ergocalciferol, (DRISDOL) 1.25 MG (50000 UNIT) CAPS capsule TAKE 1 CAPSULE (50,000 UNITS TOTAL) BY MOUTH EVERY 7 (SEVEN) DAYS 10/27/21   Sater, Nanine Means, MD  ?   ? ?Allergies    ?Patient has no known allergies.   ? ?Review of Systems   ?Review of Systems  ?Respiratory:  Positive for shortness of breath.    ?All other systems reviewed and are negative. ? ?Physical Exam ?Updated Vital Signs ?BP (!) 147/107 (BP Location: Right Arm)   Pulse (!) 133   Temp (!) 97 ?F (36.1 ?C)   Resp 17   SpO2 97%  ?Physical Exam ?Vitals and nursing note reviewed.  ?36 year old female, resting comfortably and in no acute distress. Vital signs are significant for elevated blood pressure and rapid heart rate. Oxygen saturation is 97%, which is normal. ?Head is normocephalic and atraumatic. PERRLA, EOMI. Oropharynx is clear. ?Neck is nontender and supple without adenopathy or JVD. ?Back is nontender and there is no CVA tenderness. ?Lungs have mild, diffuse expiratory wheezes without rales or rhonchi. ?Chest is nontender. ?Heart has regular rate and rhythm without murmur. ?Abdomen is soft, flat, nontender without masses or hepatosplenomegaly and peristalsis is normoactive. ?Extremities have 1+ edema.  Flexion contracture is present of the left arm. ?Skin is warm and dry without rash. ?Neurologic: Mental status is normal, cranial nerves are intact. ? ?ED Results / Procedures / Treatments   ?Labs ?(all labs ordered are listed, but only abnormal results are displayed) ?Labs Reviewed  ?BASIC METABOLIC PANEL - Abnormal; Notable for the following components:  ?    Result Value  ? Potassium 3.1 (*)   ? Glucose, Bld 139 (*)   ? BUN <5 (*)   ?  All other components within normal limits  ?CBC WITH DIFFERENTIAL/PLATELET - Abnormal; Notable for the following components:  ? WBC 12.4 (*)   ? Hemoglobin 11.8 (*)   ? Neutro Abs 10.0 (*)   ? All other components within normal limits  ?RESP PANEL BY RT-PCR (FLU A&B, COVID) ARPGX2  ?I-STAT BETA HCG BLOOD, ED ([facility], [facility], [facility] ONLY)  ? ? ?EKG ?EKG Interpretation ? ?Date/Time:  Friday Nov 25 2021 00:10:08 EDT ?Ventricular Rate:  131 ?PR Interval:  152 ?QRS Duration: 52 ?QT Interval:  290 ?QTC Calculation: 428 ?R Axis:   71 ?Text Interpretation: Sinus tachycardia Septal infarct , age undetermined Abnormal ECG When  compared with ECG of 11-Jul-2018 19:58, No significant change was found Confirmed by Delora Fuel ([id number]) on 11/25/2021 3:29:53 AM ? ?Radiology ?DG Chest 2 View ? ?Result Date: 11/25/2021 ?CLINICAL DATA:  Shortness of breath EXAM: CHEST - 2 VIEW COMPARISON:  07/11/2018 FINDINGS: Cardiac shadow is within normal limits. The lungs are clear bilaterally. No acute bony abnormality is seen. IMPRESSION: No active cardiopulmonary disease. Electronically Signed   By: Inez Catalina M.D.   On: 11/25/2021 01:04   ? ?Procedures ?Procedures  ? ? ?Medications Ordered in ED ?Medications  ?albuterol (VENTOLIN HFA) 108 (90 Base) MCG/ACT inhaler 2 puff (2 puffs Inhalation Given 11/25/21 0408)  ?ipratropium-albuterol (DUONEB) 0.5-2.5 (3) MG/3ML nebulizer solution 3 mL (3 mLs Nebulization Given 11/25/21 0409)  ?potassium chloride SA (KLOR-CON M) CR tablet 40 mEq (40 mEq Oral Given 11/25/21 0406)  ?predniSONE (DELTASONE) tablet 60 mg (60 mg Oral Given 11/25/21 0537)  ?ipratropium-albuterol (DUONEB) 0.5-2.5 (3) MG/3ML nebulizer solution 3 mL (3 mLs Nebulization Given 11/25/21 0538)  ?ipratropium-albuterol (DUONEB) 0.5-2.5 (3) MG/3ML nebulizer solution 3 mL (3 mLs Nebulization Given 11/25/21 0651)  ? ? ?ED Course/ Medical Decision Making/ A&P ?  ?                        ?Medical Decision Making ?Risk ?Prescription drug management. ? ? ?Cough and shortness of breath which appear to be part of a viral syndrome.  Considered pneumonia, influenza, COVID-19.  Chest x-ray is obtained and shows no evidence of pneumonia.  I have independently viewed the images, and agree with the radiologist's interpretation.  ECG shows sinus tachycardia but is unchanged from prior.  Labs do show mild anemia and mild leukocytosis.  Metabolic panel shows elevated random glucose and hypokalemia.  She is given a dose of oral potassium.  We will check respiratory pathogen panel to rule out COVID and influenza.  She will be given albuterol with ipratropium via nebulizer. ? ?After  nebulizer treatment, wheezing was decreased and cough is improved, but she was still was having some wheezes and cough.  She is given a dose of prednisone and will be given a second nebulizer treatment. ? ?Respiratory pathogen panel has come back negative for COVID-19 and influenza.  Patient noted no improvement with second nebulizer treatment and is given a third nebulizer treatment without any improvement.  She continues to be significantly tachycardic and is felt to be too unstable for discharge.  Case is discussed with Dr. Eulas Post of internal medicine teaching service who agrees to admit the patient. ? ?Final Clinical Impression(s) / ED Diagnoses ?Final diagnoses:  ?Acute bronchitis with bronchospasm  ?Hypokalemia  ?Sinus tachycardia  ? ? ?Rx / DC Orders ?ED Discharge Orders   ? ? None  ? ?  ? ? ?  ?Delora Fuel, MD ?[id number] ? ?

## 2021-11-26 ENCOUNTER — Other Ambulatory Visit: Payer: Self-pay | Admitting: Student

## 2021-11-26 DIAGNOSIS — R0602 Shortness of breath: Secondary | ICD-10-CM | POA: Diagnosis not present

## 2021-11-26 LAB — CBC
HCT: 40.4 % (ref 36.0–46.0)
Hemoglobin: 13.5 g/dL (ref 12.0–15.0)
MCH: 26.9 pg (ref 26.0–34.0)
MCHC: 33.4 g/dL (ref 30.0–36.0)
MCV: 80.6 fL (ref 80.0–100.0)
Platelets: 438 10*3/uL — ABNORMAL HIGH (ref 150–400)
RBC: 5.01 MIL/uL (ref 3.87–5.11)
RDW: 14.3 % (ref 11.5–15.5)
WBC: 15.7 10*3/uL — ABNORMAL HIGH (ref 4.0–10.5)
nRBC: 0 % (ref 0.0–0.2)

## 2021-11-26 LAB — BASIC METABOLIC PANEL
Anion gap: 9 (ref 5–15)
BUN: 5 mg/dL — ABNORMAL LOW (ref 6–20)
CO2: 24 mmol/L (ref 22–32)
Calcium: 10 mg/dL (ref 8.9–10.3)
Chloride: 101 mmol/L (ref 98–111)
Creatinine, Ser: 0.7 mg/dL (ref 0.44–1.00)
GFR, Estimated: >60 mL/min (ref >60)
Glucose, Bld: 123 mg/dL — ABNORMAL HIGH (ref 70–99)
Potassium: 4 mmol/L (ref 3.5–5.1)
Sodium: 134 mmol/L — ABNORMAL LOW (ref 135–145)

## 2021-11-26 LAB — HIV ANTIBODY (ROUTINE TESTING W REFLEX): HIV Screen 4th Generation wRfx: NONREACTIVE

## 2021-11-26 LAB — BRAIN NATRIURETIC PEPTIDE: B Natriuretic Peptide: 23.1 pg/mL (ref 0.0–100.0)

## 2021-11-26 LAB — TSH: TSH: 2.463 u[IU]/mL (ref 0.350–4.500)

## 2021-11-26 MED ORDER — ALBUTEROL SULFATE (2.5 MG/3ML) 0.083% IN NEBU: 2.5000 mg | INHALATION_SOLUTION | Freq: Four times a day (QID) | RESPIRATORY_TRACT | 12 refills | Status: DC | PRN

## 2021-11-26 MED ORDER — PREDNISONE 20 MG PO TABS: 40.0000 mg | ORAL_TABLET | Freq: Every day | ORAL | 0 refills | Status: AC

## 2021-11-26 MED ORDER — ALBUTEROL SULFATE (2.5 MG/3ML) 0.083% IN NEBU: 2.5000 mg | INHALATION_SOLUTION | Freq: Four times a day (QID) | RESPIRATORY_TRACT | Status: DC | PRN

## 2021-11-26 MED ORDER — GUAIFENESIN-DM 100-10 MG/5ML PO SYRP
5.0000 mL | ORAL_SOLUTION | ORAL | Status: DC | PRN
  Administered 2021-11-26 (×2): 5 mL via ORAL
  Filled 2021-11-26 (×2): qty 5

## 2021-11-26 MED ORDER — ALBUTEROL SULFATE HFA 108 (90 BASE) MCG/ACT IN AERS: 2.0000 | INHALATION_SPRAY | Freq: Four times a day (QID) | RESPIRATORY_TRACT | 2 refills | Status: AC | PRN

## 2021-11-26 MED ORDER — GUAIFENESIN-DM 100-10 MG/5ML PO SYRP: 5.0000 mL | ORAL_SOLUTION | ORAL | 0 refills | Status: DC | PRN

## 2021-11-26 NOTE — Care Management Obs Status (Signed)
MEDICARE OBSERVATION STATUS NOTIFICATION ? ? ?Patient Details  ?Name: Lindsay Schneider ?MRN: [id number] ?Date of Birth: [birth year] ? ? ?Medicare Observation Status Notification Given:  Yes ? ? ? ?Carles Collet, RN ?11/26/2021, 11:43 AM ?

## 2021-11-26 NOTE — Discharge Instructions (Addendum)
Mrs. Yonan, ? ?You were admitted for shortness of breath, most consistent with asthma. Your symptoms improved with albuterol so we are going to send a prescription for you but in order to make an official diagnosis of asthma, you will need to undergo pulmonary function testing, which your primary care physician will help you schedule. We are also giving you a prescription of steroids to take for the next few days. For your lower extremity swelling, you can try compression stockings for comfort, which will help fluid move out of your legs.  ? ?It was a pleasure taking care of you and I am glad that you are feeling better.  ? ?Please take the following medications: ?- albuterol inhaler and nebulizer as needed ?- prednisone 40 mg daily for the next 4 days  ? ?Follow up: ?- [facility] in 1 week for scheduling pulmonary function testing and to check your heart rate ? ?Sincerely,  ?Anusha Doshi ?

## 2021-11-26 NOTE — Discharge Summary (Signed)
? ?Name: Lindsay Schneider ?MRN: [id number] ?DOB: 27-Nov-1985 35 y.o. ?PCP: Patient, No Pcp Per (Inactive) ? ?Date of Admission: 11/25/2021 12:15 AM ?Date of Discharge:  11/26/2021 ?Attending Physician: Dr. Criselda Peaches ? ?Discharge Diagnosis: ?Principal Problem: ?  Asthma exacerbation ?Active Problems: ?  Secondary progressive multiple sclerosis (HCC) ?  Sinus tachycardia ?  Lower extremity edema ?  HTN (hypertension) ?  ? ?Discharge Medications: ?Allergies as of 11/26/2021   ?No Known Allergies ?  ? ?  ?Medication List  ?  ? ?STOP taking these medications   ? ?oxybutynin 5 MG tablet ?Commonly known as: DITROPAN ?  ?tiZANidine 4 MG tablet ?Commonly known as: Zanaflex ?  ? ?  ? ?TAKE these medications   ? ?acetaminophen 500 MG tablet ?Commonly known as: TYLENOL ?Take 1,000 mg by mouth every 6 (six) hours as needed for mild pain. ?  ?albuterol 108 (90 Base) MCG/ACT inhaler ?Commonly known as: VENTOLIN HFA ?Inhale 2 puffs into the lungs every 6 (six) hours as needed for wheezing or shortness of breath. ?  ?albuterol (2.5 MG/3ML) 0.083% nebulizer solution ?Commonly known as: PROVENTIL ?Take 3 mLs (2.5 mg total) by nebulization every 6 (six) hours as needed for wheezing or shortness of breath. ?  ?baclofen 10 MG tablet ?Commonly known as: LIORESAL ?TAKE 1 TO 2 TABLETS 4 TIMES A DAY ?What changed: See the new instructions. ?  ?dalfampridine 10 MG Tb12 ?TAKE 1 TABLET BY MOUTH 2 TIMES A DAY (APPROXIMATELY 12 HOURS APART) ?What changed: See the new instructions. ?  ?guaiFENesin-dextromethorphan 100-10 MG/5ML syrup ?Commonly known as: ROBITUSSIN DM ?Take 5 mLs by mouth every 4 (four) hours as needed for cough. ?  ?hydrochlorothiazide 25 MG tablet ?Commonly known as: HYDRODIURIL ?Take 1 tablet (25 mg total) by mouth daily. ?  ?phentermine 37.5 MG capsule ?TAKE 1 CAPSULE BY MOUTH EVERY DAY IN THE MORNING ?What changed: See the new instructions. ?  ?predniSONE 20 MG tablet ?Commonly known as: DELTASONE ?Take 2 tablets (40 mg total) by mouth  daily with breakfast for 4 days. ?Start taking on: Nov 27, 2021 ?  ?Vitamin D (Ergocalciferol) 1.25 MG (50000 UNIT) Caps capsule ?Commonly known as: DRISDOL ?TAKE 1 CAPSULE (50,000 UNITS TOTAL) BY MOUTH EVERY 7 (SEVEN) DAYS ?What changed: when to take this ?  ? ?  ? ? ?Disposition and follow-up:   ?Lindsay Schneider was discharged from [facility] in Stable condition.  At the hospital follow up visit please address: ? ?1.  Follow-up: ? a.  Asthma exacerbation: Diagnosed based on clinical findings during hospitalization.  Symptoms improved with albuterol and DuoNebs. Reevaluate patient's symptoms and order a PFT for formal diagnosis. ?  ? b.  Sinus tachycardia: Seems to be chronic. Inpatient evaluation did not reveal cause of tachycardia. Consider repeating an EKG and further evaluate in the outpatient. ? ? c.  Lower extremity swelling: Heart failure thought to be unlikely due to normal BNP and no other heart failure symptoms.  Reassess swelling in the legs and ensure patient is using the recommended compression stocking. ? ? ?2.  Labs / imaging needed at time of follow-up: None ? ?3.  Pending labs/ test needing follow-up: None ? ?Follow-up Appointments: ?Advised to follow-up with PCP for 1 week hospital follow-up ? ?Hospital Course by problem list: ?Lindsay Schneider is a 36 year old female with a history of multiple sclerosis and hypertension who presents with new onset shortness of breath following a viral infection, consistent with asthma exacerbation.  ? ?Asthma  exacerbation ?Patient presented with significant shortness of breath following a recent viral illness. Etiology most likely asthma given strong family history of asthma, history of chest tightness with exercise, wheezing on exam, and improvement with albuterol. CXR clear. WBC on admission 12.4 with neutrophilic predominance. Procalcitonin low. Respiratory pathogen panel negative. Presentation not consistent with pneumonia. She was treated  with albuterol and duo-nebs, with significant symptomatic relief. She was also given a single dose of IV magnesium sulfate and started on a 5 day course of prednisone. She will need outpatient follow up and PFTs for a formal diagnosis of asthma.  ? ?Sinus tachycardia ?Patient found to be in sinus tachycardia on admission EKG with heart rate 131. Prior EKG also notable for sinus tachycardia so could be chronic. Tachycardia worsened with albuterol administration but was present before beta agonist use, concerning for underlying etiology. Highest on the differential is anxiety and an acute infectious state given that patient is still endorsing viral symptoms. Also possibly secondary to phentermine use. Hyperthyroidism and pulmonary embolus ruled out with TSH and d-dimer. Patient appeared euvolemic on exam so likely not due to dehydration. No chronic beta blocker or alcohol use to suggest acute withdrawal. Patient will require outpatient monitoring and further workup. ? ?Lower extremity edema ?Patient noted to have mild bilateral pitting edema initially, improved during admission. Likely secondary to venous stasis. She does endorse orthopnea, concerning for CHF but BNP was low and there were no other signs of heart failure on exam. We recommend compression stockings as needed for comfort.  ? ?No other changes were made to chronic medications.  ? ?Subjective: ?Patient evaluated at the bedside laying comfortably in bed. Patient reports she continues to have some shortness of breath but overall improved compared to when she came in. States she is still not completely over her cold symptoms. She denies any chest pain, abdominal pain. Patient agreeable to plan for discharge home with albuterol inhalers and nebulizer. Instructed to follow-up with PCP in 1 week for further evaluation of her asthma. ? ?Discharge Vitals:   ?BP (!) 131/98 (BP Location: Right Arm)   Pulse (!) 119   Temp 98.1 ?F (36.7 ?C) (Oral)   Resp 16    SpO2 96%  ?Physical exam: ?General: Well appearing and in no acute distress, appears stated age ?Neuro: A&O x3, normal affect ?Cardiovascular: Tachycardic but no murmurs, rubs, or gallops. Trace peripheral edema bilaterally ?Pulmonary: Decreased air movement at bilateral bases but no wheezing or crackles  ?Abdominal: Abdomen soft and non-distended. Normal bowel sounds in all four quadrants. No tenderness to palpation, guarding, or rebound tenderness ?Skin: Warm and dry with no rashes, cuts, or bruises ? ?Pertinent Labs, Studies, and Procedures:  ? ?  Latest Ref Rng & Units 11/26/2021  ?  2:00 AM 11/25/2021  ? 12:38 AM 08/02/2021  ?  3:44 PM  ?CBC  ?WBC 4.0 - 10.5 K/uL 15.7   12.4   10.2    ?Hemoglobin 12.0 - 15.0 g/dL 08.0   22.3   36.1    ?Hematocrit 36.0 - 46.0 % 40.4   36.3   37.6    ?Platelets 150 - 400 K/uL 438   320   256    ? ? ? ?  Latest Ref Rng & Units 11/26/2021  ?  2:00 AM 11/25/2021  ? 12:38 AM 08/02/2021  ?  3:44 PM  ?CMP  ?Glucose 70 - 99 mg/dL 224   497   89    ?BUN 6 - 20 mg/dL  5   <5   6    ?Creatinine 0.44 - 1.00 mg/dL 0.17   4.94   4.96    ?Sodium 135 - 145 mmol/L 134   136   138    ?Potassium 3.5 - 5.1 mmol/L 4.0   3.1   4.1    ?Chloride 98 - 111 mmol/L 101   105   102    ?CO2 22 - 32 mmol/L 24   22   23     ?Calcium 8.9 - 10.3 mg/dL 75.9   9.0   9.1    ?Total Protein 6.0 - 8.5 g/dL   7.0    ?Total Bilirubin 0.0 - 1.2 mg/dL   0.4    ?Alkaline Phos 44 - 121 IU/L   94    ?AST 0 - 40 IU/L   15    ?ALT 0 - 32 IU/L   17    ? ? ?DG Chest 2 View ? ?Result Date: 11/25/2021 ?CLINICAL DATA:  Shortness of breath EXAM: CHEST - 2 VIEW COMPARISON:  07/11/2018 FINDINGS: Cardiac shadow is within normal limits. The lungs are clear bilaterally. No acute bony abnormality is seen. IMPRESSION: No active cardiopulmonary disease. Electronically Signed   By: Alcide Clever M.D.   On: 11/25/2021 01:04  ?  ? ?Discharge Instructions: ?Discharge Instructions   ? ? Diet - low sodium heart healthy   Complete by: As directed ?  ? Increase  activity slowly   Complete by: As directed ?  ? ?  ?Mrs. Colclasure, ? ?You were admitted for shortness of breath, most consistent with asthma. Your symptoms improved with albuterol so we are going to send a presc

## 2021-11-28 NOTE — Telephone Encounter (Signed)
Call to [facility] Albuterol Neb Solution is on back order.  Also stated not on formulary.  Pharmacist not sure if it will be covered under patient's Part B when it comes in.  Will need to wait until supply comes in and rerun for payment.  Patient did pick up the other Inhaler ordered. ?

## 2021-11-30 LAB — CULTURE, BLOOD (ROUTINE X 2)
Culture: NO GROWTH
Culture: NO GROWTH
Special Requests: ADEQUATE

## 2021-12-02 ENCOUNTER — Emergency Department (HOSPITAL_COMMUNITY): Payer: Medicare Other

## 2021-12-02 ENCOUNTER — Other Ambulatory Visit: Payer: Self-pay

## 2021-12-02 ENCOUNTER — Emergency Department (HOSPITAL_COMMUNITY)
Admission: EM | Admit: 2021-12-02 | Discharge: 2021-12-02 | Disposition: A | Discharge status: Home / Self Care | Payer: Medicare Other | Attending: Emergency Medicine | Admitting: Emergency Medicine

## 2021-12-02 DIAGNOSIS — R0602 Shortness of breath: Secondary | ICD-10-CM | POA: Diagnosis not present

## 2021-12-02 DIAGNOSIS — J45901 Unspecified asthma with (acute) exacerbation: Secondary | ICD-10-CM | POA: Insufficient documentation

## 2021-12-02 DIAGNOSIS — R000 Tachycardia, unspecified: Secondary | ICD-10-CM | POA: Insufficient documentation

## 2021-12-02 DIAGNOSIS — R531 Weakness: Secondary | ICD-10-CM | POA: Diagnosis not present

## 2021-12-02 DIAGNOSIS — G35 Multiple sclerosis: Secondary | ICD-10-CM | POA: Diagnosis not present

## 2021-12-02 DIAGNOSIS — J4521 Mild intermittent asthma with (acute) exacerbation: Secondary | ICD-10-CM

## 2021-12-02 DIAGNOSIS — D72829 Elevated white blood cell count, unspecified: Secondary | ICD-10-CM | POA: Diagnosis not present

## 2021-12-02 LAB — CBC
HCT: 36.7 % (ref 36.0–46.0)
Hemoglobin: 11.9 g/dL — ABNORMAL LOW (ref 12.0–15.0)
MCH: 27.4 pg (ref 26.0–34.0)
MCHC: 32.4 g/dL (ref 30.0–36.0)
MCV: 84.6 fL (ref 80.0–100.0)
Platelets: 243 10*3/uL (ref 150–400)
RBC: 4.34 MIL/uL (ref 3.87–5.11)
RDW: 14.2 % (ref 11.5–15.5)
WBC: 12.7 10*3/uL — ABNORMAL HIGH (ref 4.0–10.5)
nRBC: 0 % (ref 0.0–0.2)

## 2021-12-02 LAB — BASIC METABOLIC PANEL
Anion gap: 8 (ref 5–15)
BUN: <5 mg/dL — ABNORMAL LOW (ref 6–20)
CO2: 27 mmol/L (ref 22–32)
Calcium: 8.9 mg/dL (ref 8.9–10.3)
Chloride: 104 mmol/L (ref 98–111)
Creatinine, Ser: 0.64 mg/dL (ref 0.44–1.00)
GFR, Estimated: >60 mL/min (ref >60)
Glucose, Bld: 114 mg/dL — ABNORMAL HIGH (ref 70–99)
Potassium: 3.5 mmol/L (ref 3.5–5.1)
Sodium: 139 mmol/L (ref 135–145)

## 2021-12-02 LAB — I-STAT BETA HCG BLOOD, ED (MC, WL, AP ONLY): I-stat hCG, quantitative: <5 m[IU]/mL (ref <5)

## 2021-12-02 LAB — D-DIMER, QUANTITATIVE: D-Dimer, Quant: <0.27 ug/mL-FEU (ref 0.00–0.50)

## 2021-12-02 IMAGING — DX DG CHEST 1V PORT
1 series · 1 of 1 positions shown · non-contrast
Comparison: Chest radiograph [DATE]

CLINICAL DATA: Shortness of breath diagnosed with bronchitis x2
weeks.

EXAM:
PORTABLE CHEST 1 VIEW

[chest]
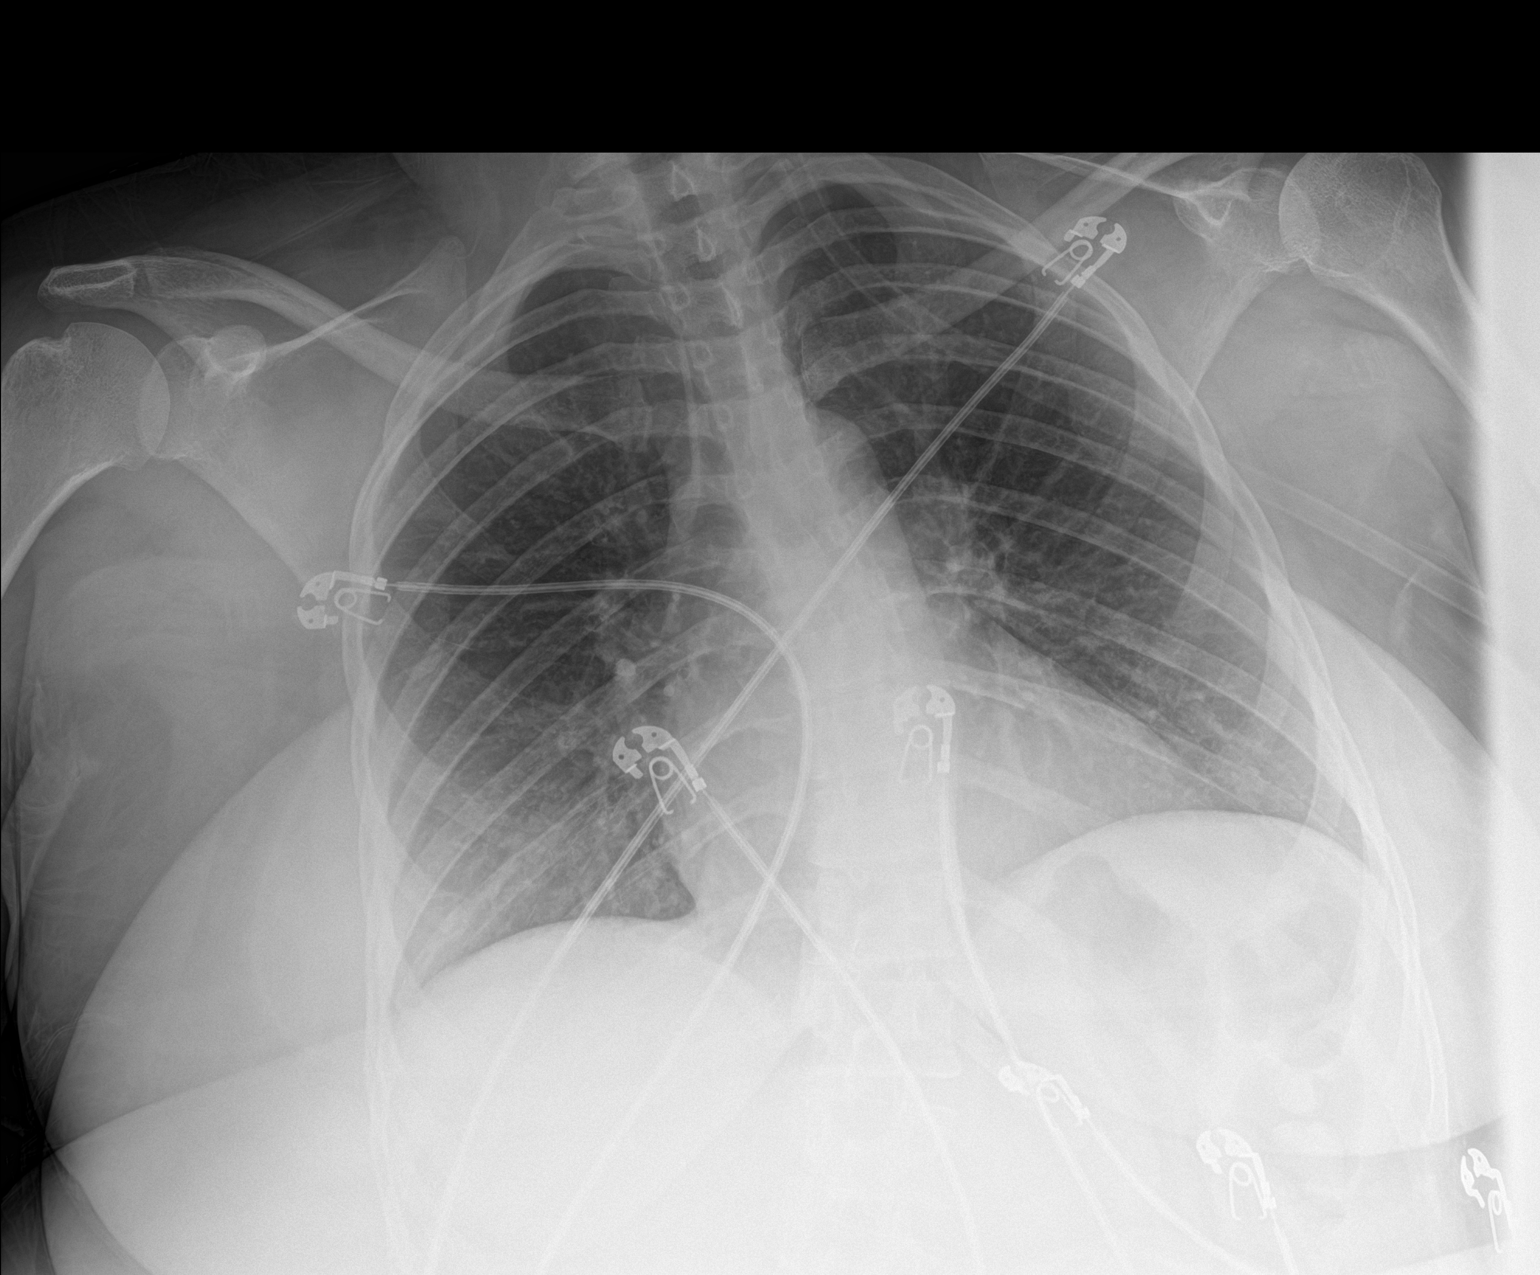

[1 of 1 positions shown; findings below may reference images not displayed]

FINDINGS: The heart size and mediastinal contours are within normal limits. No
focal airspace consolidation. Similar mild diffuse bronchial wall
thickening. No visible pleural effusion or pneumothorax. The
visualized skeletal structures are unremarkable.
IMPRESSION: No focal airspace consolidation. Similar mild diffuse bronchial wall
thickening, as can be seen with bronchitis or reactive airways
disease.

## 2021-12-02 MED ORDER — METHYLPREDNISOLONE SODIUM SUCC 125 MG IJ SOLR
125.0000 mg | Freq: Once | INTRAMUSCULAR | Status: AC
  Administered 2021-12-02: 125 mg via INTRAVENOUS
  Filled 2021-12-02: qty 2

## 2021-12-02 MED ORDER — IPRATROPIUM-ALBUTEROL 0.5-2.5 (3) MG/3ML IN SOLN
3.0000 mL | Freq: Once | RESPIRATORY_TRACT | Status: AC
  Administered 2021-12-02: 3 mL via RESPIRATORY_TRACT
  Filled 2021-12-02: qty 3

## 2021-12-02 MED ORDER — PREDNISONE 10 MG PO TABS: ORAL_TABLET | ORAL | 0 refills | Status: AC

## 2021-12-02 MED ORDER — ALBUTEROL SULFATE (2.5 MG/3ML) 0.083% IN NEBU
5.0000 mg | INHALATION_SOLUTION | Freq: Once | RESPIRATORY_TRACT | Status: AC
  Administered 2021-12-02: 5 mg via RESPIRATORY_TRACT
  Filled 2021-12-02: qty 6

## 2021-12-02 NOTE — ED Triage Notes (Signed)
Via EMS. Pt reports dx with "bronchitis" x 2 weeks ago, pt seen here last week for same complaint. Pt states worsening SOB/cough starting last night. Pt has Lindsay Schneider non productive cough, pt able to speak in short sentences. Hx MS  ?

## 2021-12-02 NOTE — ED Notes (Signed)
ED Provider at bedside. 

## 2021-12-02 NOTE — Discharge Instructions (Signed)
Take the medications as prescribed.  Follow-up with your doctor to be rechecked.  Return as needed for worsening symptoms. 

## 2021-12-02 NOTE — ED Notes (Signed)
Patient transported to X-ray 

## 2021-12-02 NOTE — ED Provider Notes (Signed)
?[facility] ?Provider Note ? ? ?CSN: [id number] ?Arrival date & time: 12/02/21  0940 ? ?  ? ?History ? ?Chief Complaint  ?Patient presents with  ? Shortness of Breath  ? ? ?Lindsay Schneider is a 36 y.o. female. ? ?HPI ? ?Patient presents to the ED with complaints of shortness of breath.  Patient has history of MS, spastic hemiplegia. ?Patient was recently admitted to the hospital on May 5 and discharged on May 6.  Records reviewed from that visit patient.  Patient was admitted for a asthma exacerbation.  Patient states she improved after that hospitalization.  Last night however she started noticing recurrent shortness of breath similar to when she was in the hospital.  Patient tried her inhalers without much relief.  She has not had any chest pain.  No fevers.  No vomiting or diarrhea. ?Home Medications ?Prior to Admission medications   ?Medication Sig Start Date End Date Taking? Authorizing Provider  ?acetaminophen (TYLENOL) 500 MG tablet Take 1,000 mg by mouth every 6 (six) hours as needed for mild pain.    [provider]  ?albuterol (PROVENTIL) (2.5 MG/3ML) 0.083% nebulizer solution Take 3 mLs (2.5 mg total) by nebulization every 6 (six) hours as needed for wheezing or shortness of breath. 11/26/21   Lacinda Axon, MD  ?albuterol (VENTOLIN HFA) 108 (90 Base) MCG/ACT inhaler Inhale 2 puffs into the lungs every 6 (six) hours as needed for wheezing or shortness of breath. 11/26/21   Lacinda Axon, MD  ?baclofen (LIORESAL) 10 MG tablet TAKE 1 TO 2 TABLETS 4 TIMES A DAY ?Patient taking differently: Take 10 mg by mouth 4 (four) times daily. 08/29/21   Sater, Nanine Means, MD  ?dalfampridine 10 MG TB12 TAKE 1 TABLET BY MOUTH 2 TIMES A DAY (APPROXIMATELY 12 HOURS APART) ?Patient taking differently: Take 10 mg by mouth 2 (two) times daily. 08/09/21   Lomax, Amy, NP  ?guaiFENesin-dextromethorphan (ROBITUSSIN DM) 100-10 MG/5ML syrup Take 5 mLs by mouth every 4 (four) hours  as needed for cough. 11/26/21   Lacinda Axon, MD  ?hydrochlorothiazide (HYDRODIURIL) 25 MG tablet Take 1 tablet (25 mg total) by mouth daily. 01/25/21   Sater, Nanine Means, MD  ?phentermine 37.5 MG capsule TAKE 1 CAPSULE BY MOUTH EVERY DAY IN THE MORNING ?Patient taking differently: Take 37.5 mg by mouth in the morning. 11/17/21   Sater, Nanine Means, MD  ?Vitamin D, Ergocalciferol, (DRISDOL) 1.25 MG (50000 UNIT) CAPS capsule TAKE 1 CAPSULE (50,000 UNITS TOTAL) BY MOUTH EVERY 7 (SEVEN) DAYS ?Patient taking differently: Take 50,000 Units by mouth every Monday. 10/27/21   Sater, Nanine Means, MD  ?   ? ?Allergies    ?Patient has no known allergies.   ? ?Review of Systems   ?Review of Systems  ?Constitutional:  Negative for fever.  ? ?Physical Exam ?Updated Vital Signs ?BP (!) 142/97   Pulse (!) 132   Temp 98.5 ?F (36.9 ?C) (Oral)   Resp (!) 22   Ht 1.575 m (5\' 2" )   Wt 75.3 kg   LMP 11/21/2021 (Approximate)   SpO2 100%   BMI 30.36 kg/m?  ?Physical Exam ?Vitals and nursing note reviewed.  ?Constitutional:   ?   General: She is not in acute distress. ?   Appearance: She is well-developed.  ?HENT:  ?   Head: Normocephalic and atraumatic.  ?   Right Ear: External ear normal.  ?   Left Ear: External ear normal.  ?Eyes:  ?  General: No scleral icterus.    ?   Right eye: No discharge.     ?   Left eye: No discharge.  ?   Conjunctiva/sclera: Conjunctivae normal.  ?Neck:  ?   Trachea: No tracheal deviation.  ?Cardiovascular:  ?   Rate and Rhythm: Regular rhythm. Tachycardia present.  ?Pulmonary:  ?   Effort: No respiratory distress.  ?   Breath sounds: No stridor. Wheezing present. No rales.  ?   Comments: Tachypnea ?Abdominal:  ?   General: Bowel sounds are normal. There is no distension.  ?   Palpations: Abdomen is soft.  ?   Tenderness: There is no abdominal tenderness. There is no guarding or rebound.  ?Musculoskeletal:     ?   General: No tenderness or deformity.  ?   Cervical back: Neck supple.  ?Skin: ?   General: Skin  is warm and dry.  ?   Findings: No rash.  ?Neurological:  ?   Mental Status: She is alert. Mental status is at baseline.  ?   Cranial Nerves: No cranial nerve deficit (no facial droop, extraocular movements intact, no slurred speech).  ?   Sensory: No sensory deficit.  ?   Motor: Weakness present. No abnormal muscle tone or seizure activity.  ?   Coordination: Coordination normal.  ?   Comments: Contracture left upper extremity, spastic hemiplegia left side, no weakness bilateral lower extremities  ?Psychiatric:     ?   Mood and Affect: Mood normal.  ? ? ?ED Results / Procedures / Treatments   ?Labs ?(all labs ordered are listed, but only abnormal results are displayed) ?Labs Reviewed  ?CBC - Abnormal; Notable for the following components:  ?    Result Value  ? WBC 12.7 (*)   ? Hemoglobin 11.9 (*)   ? All other components within normal limits  ?BASIC METABOLIC PANEL - Abnormal; Notable for the following components:  ? Glucose, Bld 114 (*)   ? BUN <5 (*)   ? All other components within normal limits  ?D-DIMER, QUANTITATIVE  ?I-STAT BETA HCG BLOOD, ED ([facility], [facility], [facility] ONLY)  ? ? ?EKG ?EKG Interpretation ? ?Date/Time:  Friday Dec 02 2021 09:41:41 EDT ?Ventricular Rate:  121 ?PR Interval:  148 ?QRS Duration: 85 ?QT Interval:  316 ?QTC Calculation: 449 ?R Axis:   67 ?Text Interpretation: Sinus tachycardia Anterior infarct, old No significant change since last tracing Confirmed by Dorie Rank [phone]) on 12/02/2021 9:45:02 AM ? ?Radiology ?DG Chest Portable 1 View ? ?Result Date: 12/02/2021 ?CLINICAL DATA:  Shortness of breath diagnosed with bronchitis x2 weeks. EXAM: PORTABLE CHEST 1 VIEW COMPARISON:  Chest radiograph Nov 25, 2021 FINDINGS: The heart size and mediastinal contours are within normal limits. No focal airspace consolidation. Similar mild diffuse bronchial wall thickening. No visible pleural effusion or pneumothorax. The visualized skeletal structures are unremarkable. IMPRESSION: No focal airspace consolidation.  Similar mild diffuse bronchial wall thickening, as can be seen with bronchitis or reactive airways disease. Electronically Signed   By: Dahlia Bailiff M.D.   On: 12/02/2021 10:40   ? ?Procedures ?Procedures  ? ? ?Medications Ordered in ED ?Medications  ?methylPREDNISolone sodium succinate (SOLU-MEDROL) 125 mg/2 mL injection 125 mg (125 mg Intravenous Given 12/02/21 1010)  ?ipratropium-albuterol (DUONEB) 0.5-2.5 (3) MG/3ML nebulizer solution 3 mL (3 mLs Nebulization Given 12/02/21 1011)  ? ? ?ED Course/ Medical Decision Making/ A&P ?Clinical Course as of 12/02/21 1154  ?Fri Dec 02, 2021  ?1128 D-dimer, quantitative ?Normal [JK]  ?  1152 CBC(!) ?White blood cell count slightly increased, hemoglobin stable [JK]  ?[id number] Basic metabolic panel(!) ?Normal [JK]  ?1152 DG Chest Portable 1 View ?Chest x-ray without signs of pneumonia [JK]  ?1153 Patient states she is feeling somewhat better.  We will try an additional breathing treatment.  Lungs reexamined no significant wheezing noted [JK]  ?  ?Clinical Course User Index ?[JK] Dorie Rank, MD  ? ?                        ?Medical Decision Making ?Problems Addressed: ?Mild intermittent asthma with exacerbation: acute illness or injury that poses a threat to life or bodily functions ?Multiple sclerosis ([place]): chronic illness or injury ? ?Amount and/or Complexity of Data Reviewed ?Labs: ordered. Decision-making details documented in ED Course. ?Radiology: ordered and independent interpretation performed. Decision-making details documented in ED Course. ?   Details: No acute pna ?ECG/medicine tests: independent interpretation performed. ? ?Risk ?Prescription drug management. ? ? ?Patient presented to the ED for evaluation of shortness of breath.  Patient was recently hospital for bronchospasm.  ED work-up reassuring.  D-dimer negative.  Doubt PE.  Chest x-ray without evidence of pneumonia.  Laboratory tests are unremarkable.  Patient did have notable wheezing on exam initially.  She  was given breathing treatments and steroids with significant improvement.  Patient tachycardic after her breathing treatment but that is decreasing.  At this time feel she is appropriate for outpatient management

## 2021-12-07 DIAGNOSIS — G35 Multiple sclerosis: Secondary | ICD-10-CM | POA: Diagnosis not present

## 2021-12-07 DIAGNOSIS — I10 Essential (primary) hypertension: Secondary | ICD-10-CM | POA: Diagnosis not present

## 2021-12-07 DIAGNOSIS — M6281 Muscle weakness (generalized): Secondary | ICD-10-CM | POA: Diagnosis not present

## 2021-12-11 NOTE — Progress Notes (Signed)
Patient presented with cough and shortness of breath - testing for influenza and COVID was appropriate.  Dione Booze MD

## 2021-12-21 ENCOUNTER — Telehealth: Payer: Self-pay | Admitting: Family Medicine

## 2021-12-21 NOTE — Telephone Encounter (Signed)
Called pt to see if she had found a PCP. [facility] reached out stating they do not use their referral workqueue and this was sent to them 5 months ago. They are asking for the pt to reach out to them to get scheduled if pt has not already been established, phone number for [facility] [phone].

## 2021-12-23 ENCOUNTER — Other Ambulatory Visit: Payer: Self-pay | Admitting: Neurology

## 2021-12-23 DIAGNOSIS — I10 Essential (primary) hypertension: Secondary | ICD-10-CM

## 2021-12-26 NOTE — Telephone Encounter (Signed)
Last OV was on 08/02/21.  Next OV is scheduled for 02/02/22.  Last RX was written on 11/17/21 for 30 tabs.   [place] [facility] has been reviewed.

## 2022-01-05 DIAGNOSIS — R252 Cramp and spasm: Secondary | ICD-10-CM | POA: Diagnosis not present

## 2022-01-05 DIAGNOSIS — G35 Multiple sclerosis: Secondary | ICD-10-CM | POA: Diagnosis not present

## 2022-01-05 DIAGNOSIS — J209 Acute bronchitis, unspecified: Secondary | ICD-10-CM | POA: Diagnosis not present

## 2022-01-05 DIAGNOSIS — I10 Essential (primary) hypertension: Secondary | ICD-10-CM | POA: Diagnosis not present

## 2022-01-12 ENCOUNTER — Emergency Department (HOSPITAL_COMMUNITY): Payer: Medicare Other

## 2022-01-12 ENCOUNTER — Inpatient Hospital Stay (HOSPITAL_COMMUNITY)
Admission: EM | Admit: 2022-01-12 | Discharge: 2022-01-16 | DRG: 202 | Disposition: A | Discharge status: Home / Self Care | Payer: Medicare Other | Attending: Internal Medicine | Admitting: Internal Medicine

## 2022-01-12 ENCOUNTER — Observation Stay (HOSPITAL_COMMUNITY): Payer: Medicare Other

## 2022-01-12 ENCOUNTER — Encounter (HOSPITAL_COMMUNITY): Payer: Self-pay

## 2022-01-12 ENCOUNTER — Other Ambulatory Visit: Payer: Self-pay

## 2022-01-12 DIAGNOSIS — R29898 Other symptoms and signs involving the musculoskeletal system: Secondary | ICD-10-CM

## 2022-01-12 DIAGNOSIS — Z8249 Family history of ischemic heart disease and other diseases of the circulatory system: Secondary | ICD-10-CM | POA: Diagnosis not present

## 2022-01-12 DIAGNOSIS — J45901 Unspecified asthma with (acute) exacerbation: Secondary | ICD-10-CM | POA: Diagnosis not present

## 2022-01-12 DIAGNOSIS — G8114 Spastic hemiplegia affecting left nondominant side: Secondary | ICD-10-CM | POA: Diagnosis present

## 2022-01-12 DIAGNOSIS — M21372 Foot drop, left foot: Secondary | ICD-10-CM | POA: Diagnosis not present

## 2022-01-12 DIAGNOSIS — Z6830 Body mass index (BMI) 30.0-30.9, adult: Secondary | ICD-10-CM | POA: Diagnosis not present

## 2022-01-12 DIAGNOSIS — R0902 Hypoxemia: Secondary | ICD-10-CM | POA: Diagnosis not present

## 2022-01-12 DIAGNOSIS — Z79899 Other long term (current) drug therapy: Secondary | ICD-10-CM

## 2022-01-12 DIAGNOSIS — Z20822 Contact with and (suspected) exposure to covid-19: Secondary | ICD-10-CM | POA: Diagnosis present

## 2022-01-12 DIAGNOSIS — R062 Wheezing: Secondary | ICD-10-CM | POA: Diagnosis not present

## 2022-01-12 DIAGNOSIS — G35 Multiple sclerosis: Secondary | ICD-10-CM | POA: Diagnosis not present

## 2022-01-12 DIAGNOSIS — J9801 Acute bronchospasm: Secondary | ICD-10-CM | POA: Diagnosis present

## 2022-01-12 DIAGNOSIS — I10 Essential (primary) hypertension: Secondary | ICD-10-CM | POA: Diagnosis present

## 2022-01-12 DIAGNOSIS — Z825 Family history of asthma and other chronic lower respiratory diseases: Secondary | ICD-10-CM

## 2022-01-12 DIAGNOSIS — E669 Obesity, unspecified: Secondary | ICD-10-CM | POA: Diagnosis present

## 2022-01-12 DIAGNOSIS — R9431 Abnormal electrocardiogram [ECG] [EKG]: Secondary | ICD-10-CM | POA: Diagnosis not present

## 2022-01-12 DIAGNOSIS — Z993 Dependence on wheelchair: Secondary | ICD-10-CM | POA: Diagnosis not present

## 2022-01-12 DIAGNOSIS — R000 Tachycardia, unspecified: Secondary | ICD-10-CM | POA: Diagnosis present

## 2022-01-12 DIAGNOSIS — J4541 Moderate persistent asthma with (acute) exacerbation: Secondary | ICD-10-CM | POA: Diagnosis not present

## 2022-01-12 DIAGNOSIS — R0602 Shortness of breath: Secondary | ICD-10-CM | POA: Diagnosis not present

## 2022-01-12 DIAGNOSIS — R059 Cough, unspecified: Secondary | ICD-10-CM | POA: Diagnosis not present

## 2022-01-12 LAB — BASIC METABOLIC PANEL
Anion gap: 12 (ref 5–15)
BUN: <5 mg/dL — ABNORMAL LOW (ref 6–20)
CO2: 20 mmol/L — ABNORMAL LOW (ref 22–32)
Calcium: 9.2 mg/dL (ref 8.9–10.3)
Chloride: 104 mmol/L (ref 98–111)
Creatinine, Ser: 0.56 mg/dL (ref 0.44–1.00)
GFR, Estimated: >60 mL/min (ref >60)
Glucose, Bld: 114 mg/dL — ABNORMAL HIGH (ref 70–99)
Potassium: 3.5 mmol/L (ref 3.5–5.1)
Sodium: 136 mmol/L (ref 135–145)

## 2022-01-12 LAB — CBC WITH DIFFERENTIAL/PLATELET
Abs Immature Granulocytes: 0.04 10*3/uL (ref 0.00–0.07)
Basophils Absolute: 0.1 10*3/uL (ref 0.0–0.1)
Basophils Relative: 0 %
Eosinophils Absolute: 1 10*3/uL — ABNORMAL HIGH (ref 0.0–0.5)
Eosinophils Relative: 8 %
HCT: 37.5 % (ref 36.0–46.0)
Hemoglobin: 12.3 g/dL (ref 12.0–15.0)
Immature Granulocytes: 0 %
Lymphocytes Relative: 17 %
Lymphs Abs: 2 10*3/uL (ref 0.7–4.0)
MCH: 26.9 pg (ref 26.0–34.0)
MCHC: 32.8 g/dL (ref 30.0–36.0)
MCV: 81.9 fL (ref 80.0–100.0)
Monocytes Absolute: 0.9 10*3/uL (ref 0.1–1.0)
Monocytes Relative: 8 %
Neutro Abs: 7.5 10*3/uL (ref 1.7–7.7)
Neutrophils Relative %: 67 %
Platelets: 267 10*3/uL (ref 150–400)
RBC: 4.58 MIL/uL (ref 3.87–5.11)
RDW: 14.6 % (ref 11.5–15.5)
WBC: 11.5 10*3/uL — ABNORMAL HIGH (ref 4.0–10.5)
nRBC: 0 % (ref 0.0–0.2)

## 2022-01-12 LAB — URINALYSIS, ROUTINE W REFLEX MICROSCOPIC
Bacteria, UA: NONE SEEN
Bilirubin Urine: NEGATIVE
Glucose, UA: NEGATIVE mg/dL
Ketones, ur: 80 mg/dL — AB
Leukocytes,Ua: NEGATIVE
Nitrite: NEGATIVE
Protein, ur: NEGATIVE mg/dL
Specific Gravity, Urine: 1.011 (ref 1.005–1.030)
pH: 5 (ref 5.0–8.0)

## 2022-01-12 LAB — RESP PANEL BY RT-PCR (FLU A&B, COVID) ARPGX2
Influenza A by PCR: NEGATIVE
Influenza B by PCR: NEGATIVE
SARS Coronavirus 2 by RT PCR: NEGATIVE

## 2022-01-12 LAB — I-STAT BETA HCG BLOOD, ED (MC, WL, AP ONLY): I-stat hCG, quantitative: <5 m[IU]/mL (ref <5)

## 2022-01-12 IMAGING — MR MR THORACIC SPINE WO/W CM
5 of 9 series · 21 of 48 positions shown · IV contrast (gadavist)
Comparison: [DATE]

CLINICAL DATA: Multiple sclerosis

EXAM:
MRI HEAD WITHOUT AND WITH CONTRAST
MRI CERVICAL SPINE WITHOUT AND WITH CONTRAST
MRI CERVICAL THORACIC WITHOUT AND CONTRAST
CONTRAST:  8mL GADAVIST GADOBUTROL 1 MMOL/ML IV SOLN
TECHNIQUE: Multiplanar, multiecho pulse sequences of the brain and surrounding
structures, and cervical and thoracic spine were obtained without
and with intravenous contrast.

[Series 7: T1 · sagittal · 3.3mm · 0.62mm/px · 1 of 9 slices shown (1 of 3)]
[im 1/9]
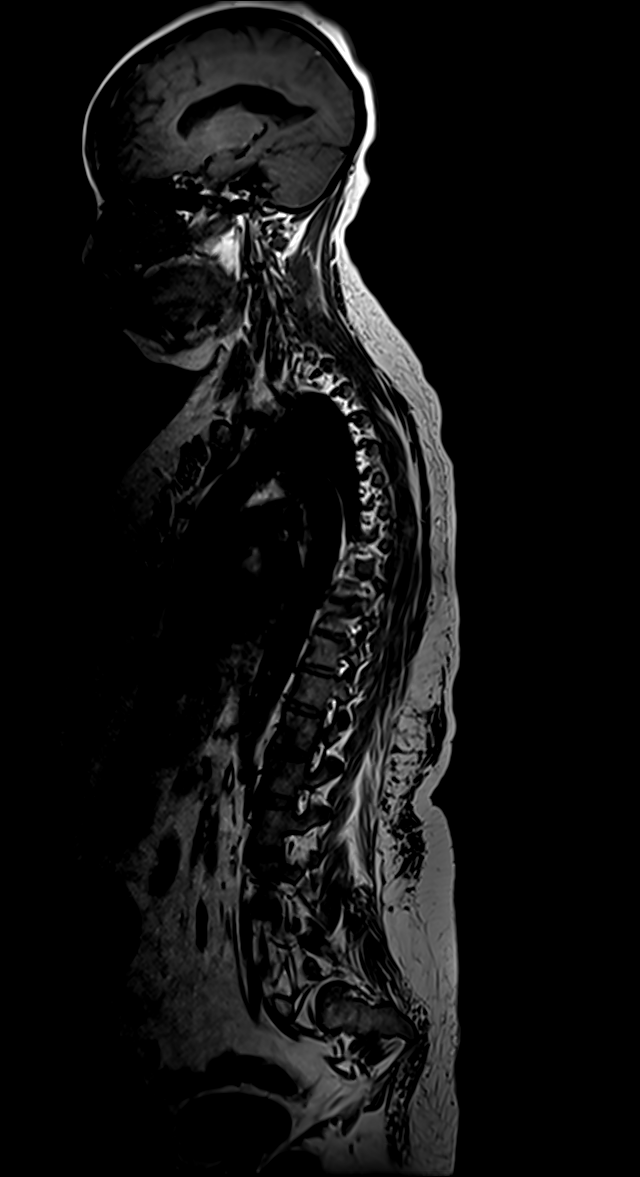

[Series 8: T2 · sagittal · 3.0mm · 0.76mm/px · 3 of 19 slices shown (1 of 2)]
[im 1/19]
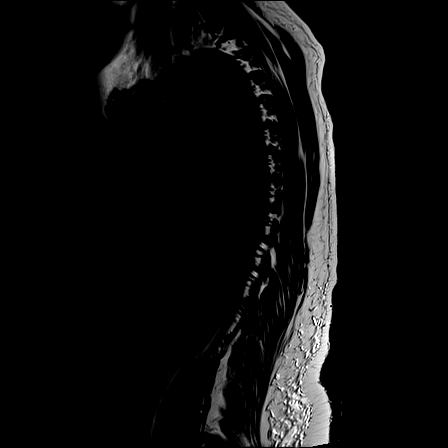
[im 10/19]
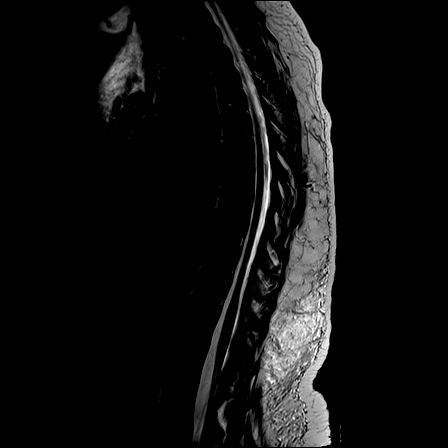
[im 19/19]
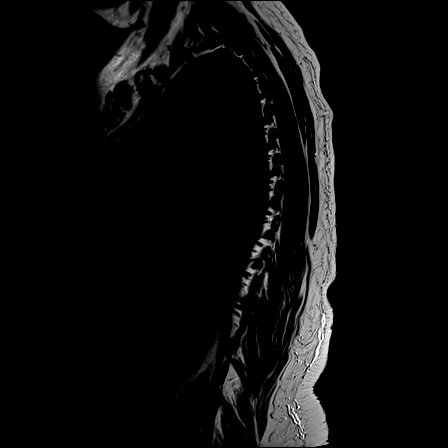

[Series 9: T1 · sagittal · 3.0mm · 0.76mm/px · 4 of 19 slices shown (2 of 3)]
[im 1/19]
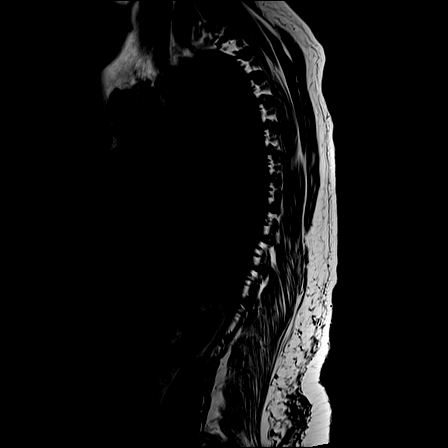
[im 7/19]
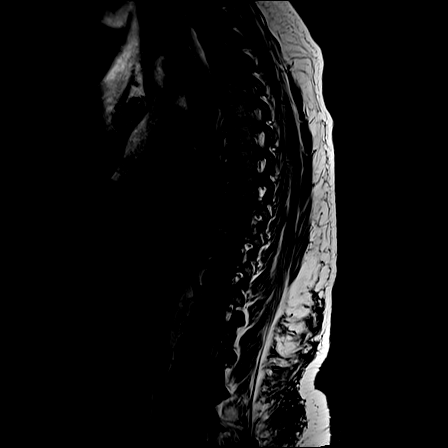
[im 13/19]
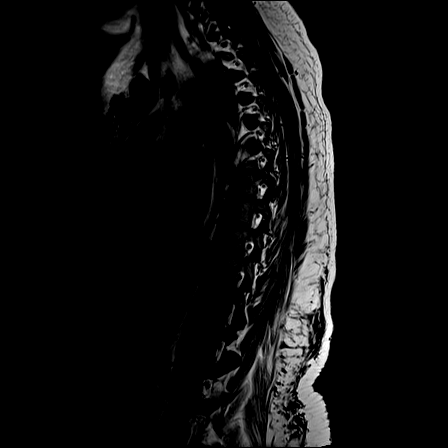
[im 19/19]
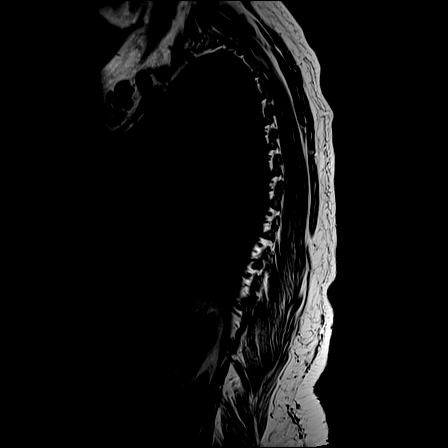

[Series 11: T2 · axial · 5.0mm · 0.59mm/px · z∈[-434,-238]mm · 8 of 39 slices shown (2 of 2)]
[im 1/39]
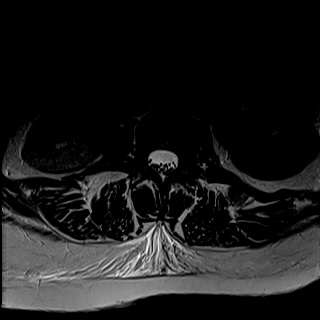
[im 6/39]
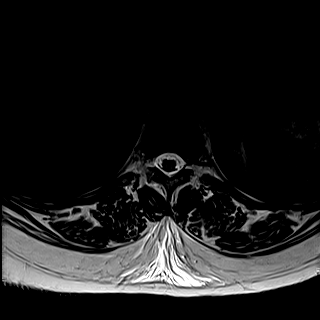
[im 11/39]
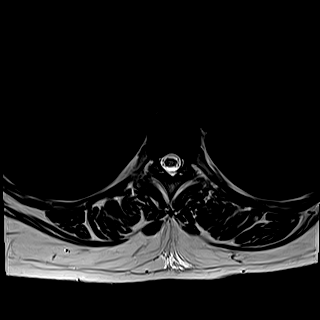
[im 17/39]
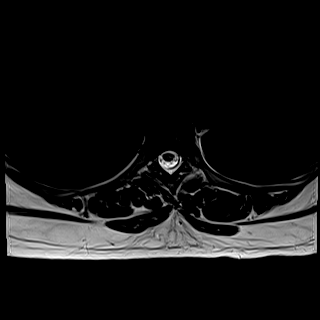
[im 22/39]
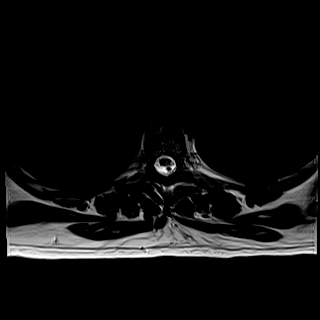
[im 28/39]
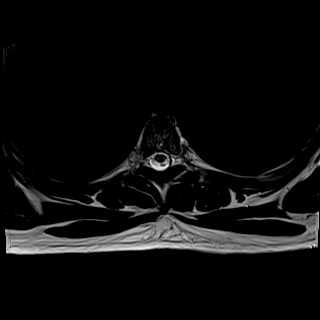
[im 33/39]
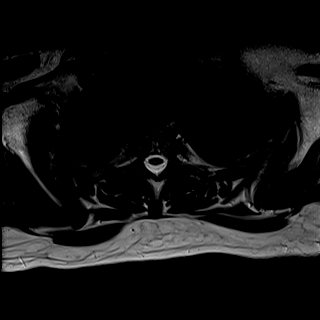
[im 39/39]
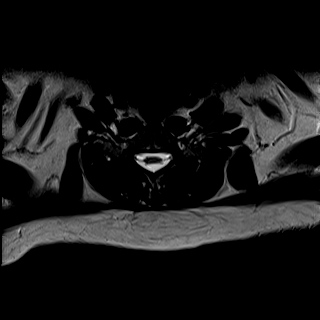

[Series 13: T1 · axial · non-contrast · 5.0mm · 0.31mm/px · z∈[-434,-309]mm · 5 of 39 slices shown (3 of 3)]
[im 1/39]
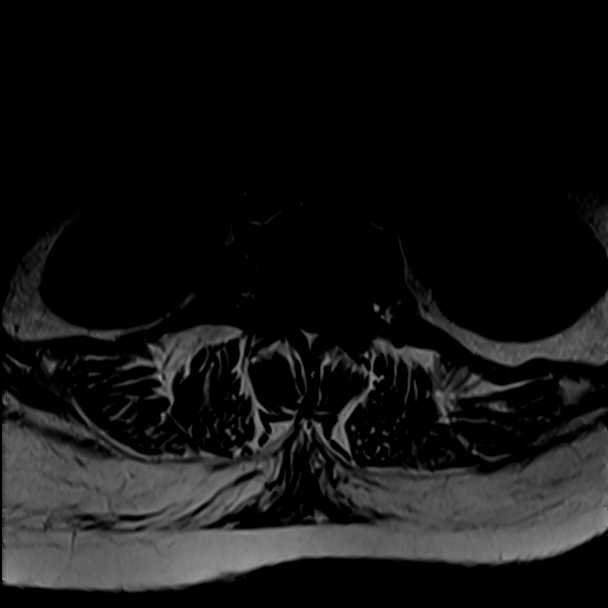
[im 6/39]
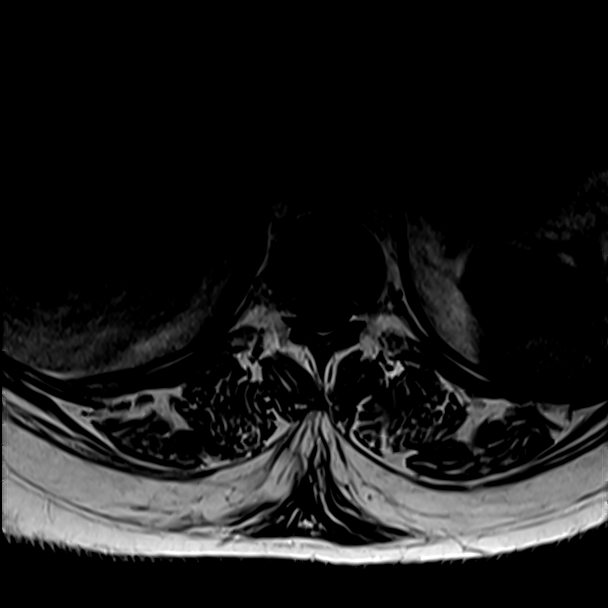
[im 11/39]
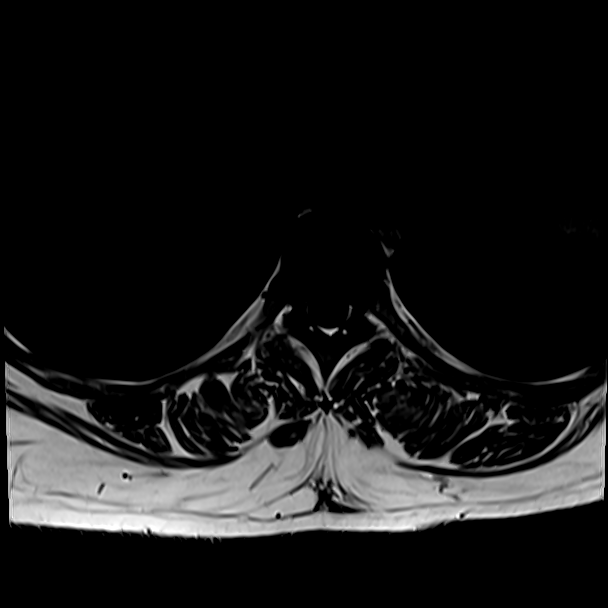
[im 17/39]
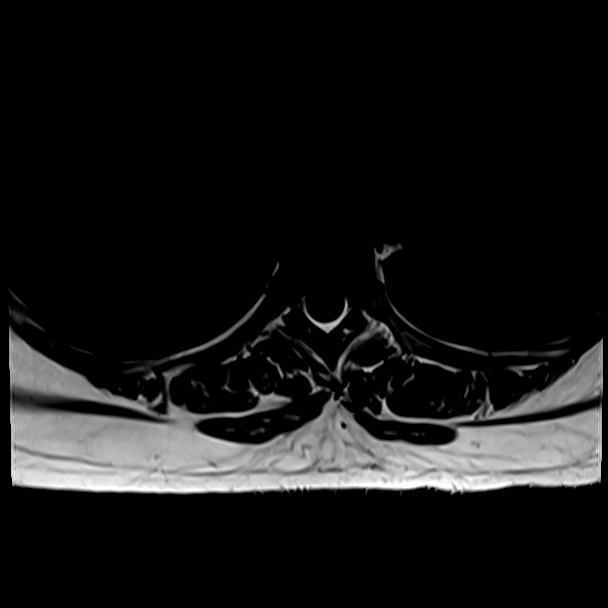
[im 22/39]
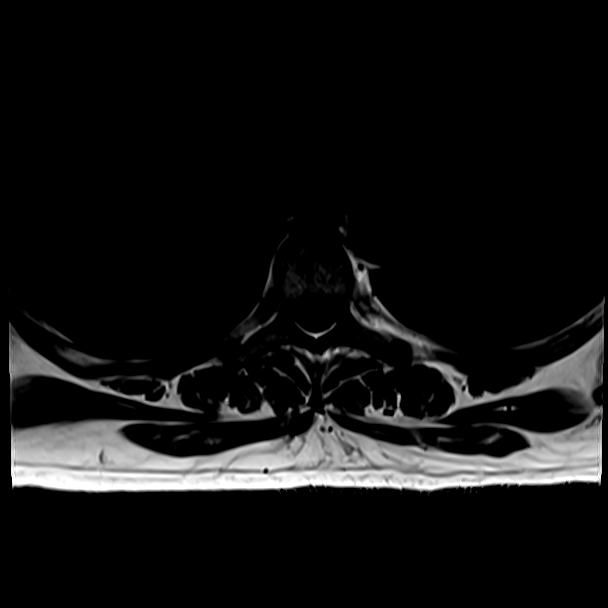

[21 of 48 positions shown; findings below may reference images not displayed]

FINDINGS: MRI HEAD FINDINGS

Brain: No acute infarct, mass effect or extra-axial collection. No
acute or chronic hemorrhage. Confluent bilateral periventricular and
pericallosal white matter lesions are unchanged. Lesions within both
cerebellar peduncles are also unchanged. The midline structures are
normal. There is no abnormal contrast enhancement.

Vascular: Major flow voids are preserved.

Skull and upper cervical spine: Normal calvarium and skull base.
Visualized upper cervical spine and soft tissues are normal.

Sinuses/Orbits:No paranasal sinus fluid levels or advanced mucosal
thickening. No mastoid or middle ear effusion. Normal orbits.

MRI CERVICAL SPINE FINDINGS

Alignment: Physiologic.

Vertebrae: No fracture, evidence of discitis, or bone lesion.

Cord: Chronic demyelinating lesions within the upper cervical spinal
cord at the C2 and C3 levels are unchanged. There are no enhancing
lesions.

Posterior Fossa, vertebral arteries, paraspinal tissues: Negative

Disc levels:

C1-2: Unremarkable.

C2-3: Normal disc space and facet joints. There is no spinal canal
stenosis. No neural foraminal stenosis.

C3-4: Normal disc space and facet joints. There is no spinal canal
stenosis. No neural foraminal stenosis.

C4-5: Unchanged small disc bulge with bilateral uncovertebral
osteophytes. There is no spinal canal stenosis. Unchanged mild
bilateral neural foraminal stenosis.

C5-6: Normal disc space and facet joints. There is no spinal canal
stenosis. No neural foraminal stenosis.

C6-7: Normal disc space and facet joints. There is no spinal canal
stenosis. No neural foraminal stenosis.

C7-T1: Normal disc space and facet joints. There is no spinal canal
stenosis. No neural foraminal stenosis.

MRI THORACIC SPINE FINDINGS

Alignment:  Physiologic.

Vertebrae: No fracture, evidence of discitis, or bone lesion.

Cord:  Normal signal and morphology.

Paraspinal and other soft tissues: Negative.

Disc levels: No spinal canal stenosis.
IMPRESSION: 1. Unchanged distribution of white matter lesions consistent with
multiple sclerosis. No evidence of active demyelination.
2. Unchanged appearance of chronic demyelinating lesions of the
upper cervical spinal cord.
3. No evidence of demyelinating disease within the thoracic spinal
cord.
4. Unchanged mild C4-5 degenerative disc disease with mild bilateral
neural foraminal stenosis.

## 2022-01-12 IMAGING — DX DG CHEST 1V PORT
1 series · 1 of 1 positions shown · non-contrast
Comparison: [DATE]

CLINICAL DATA: Shortness of breath for 1 month

EXAM:
PORTABLE CHEST 1 VIEW

[chest]
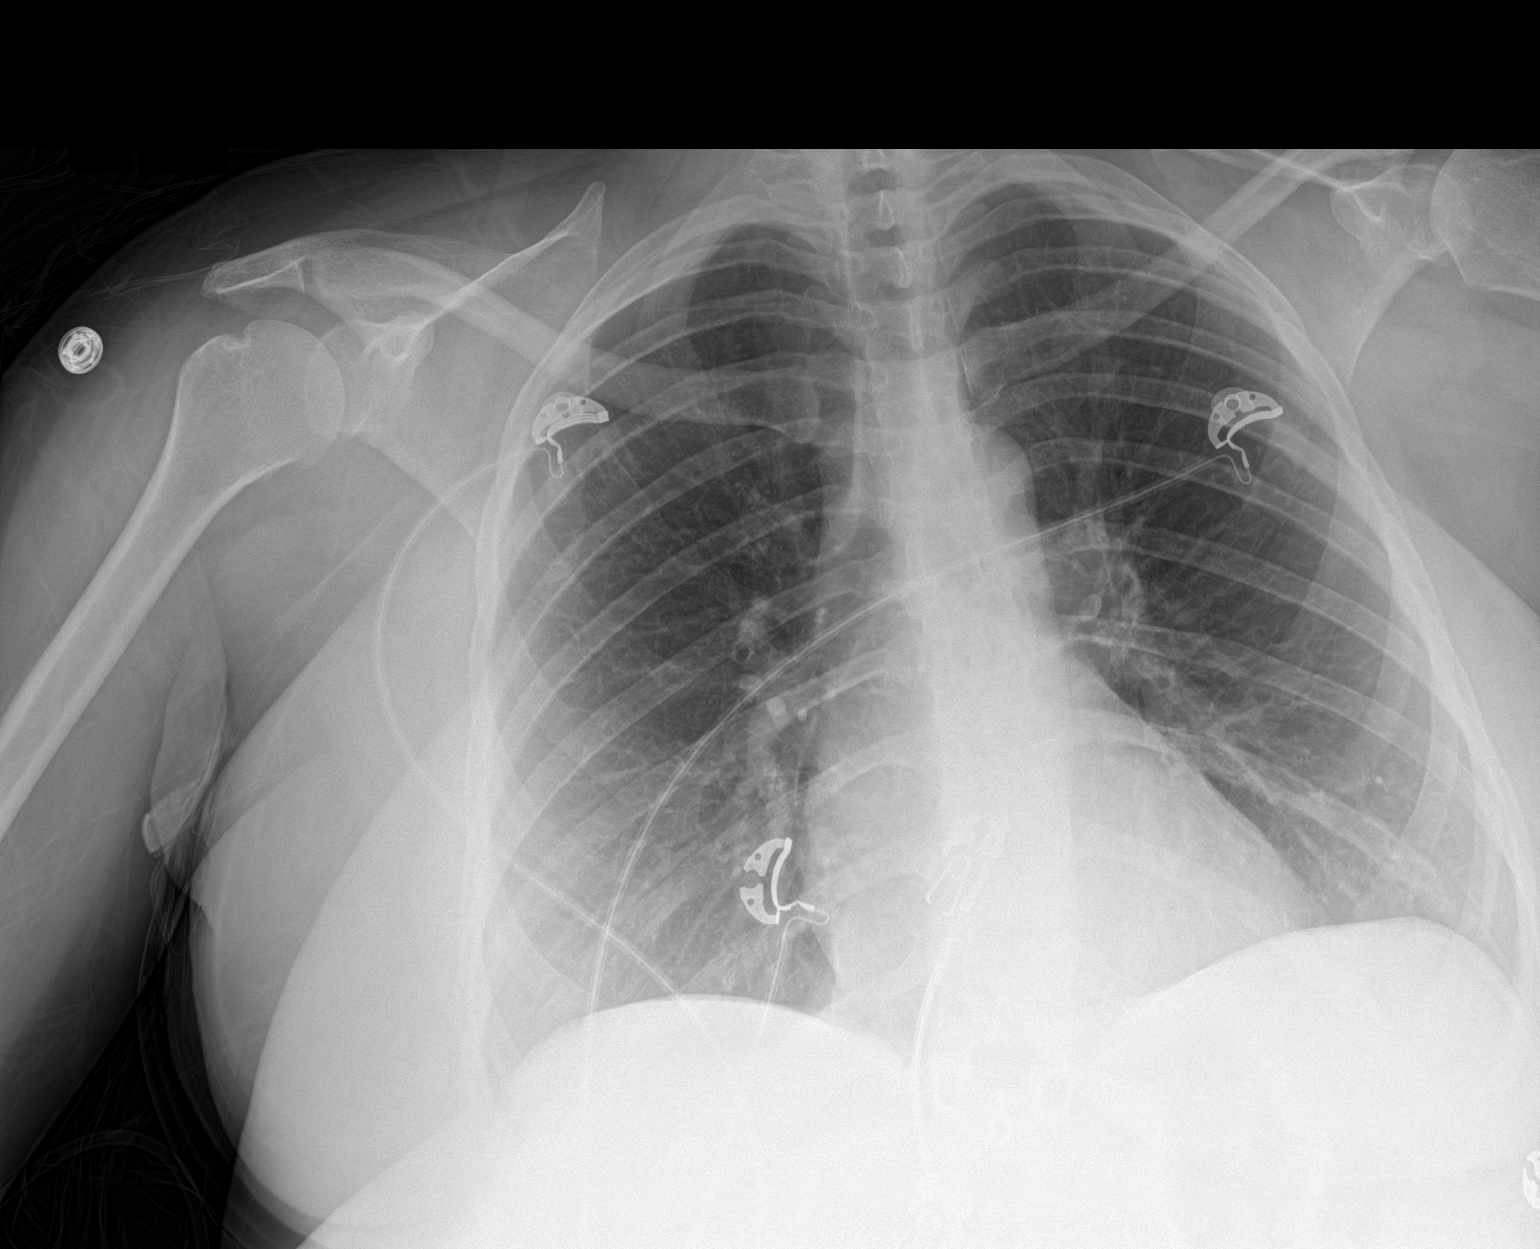

[1 of 1 positions shown; findings below may reference images not displayed]

FINDINGS: The heart size and mediastinal contours are within normal limits.
Both lungs are clear. The visualized skeletal structures are
unremarkable.
IMPRESSION: No acute abnormality of the lungs in AP portable projection.

## 2022-01-12 IMAGING — CT CT ANGIO CHEST
2 of 7 series · 19 of 46 positions shown · IV contrast (agent unspecified)
Comparison: Previous studies including the chest radiographs done
earlier today

CLINICAL DATA: Shortness of breath, cough

EXAM:
CT ANGIOGRAPHY CHEST WITH CONTRAST
TECHNIQUE: Multidetector CT imaging of the chest was performed using the
standard protocol during bolus administration of intravenous
contrast. Multiplanar CT image reconstructions and MIPs were
obtained to evaluate the vascular anatomy.

[Series 6: thins · axial · 0.95mm/px · z∈[+1131,+1431]mm · 16 of 336 slices shown]
[im 18/336  lung]
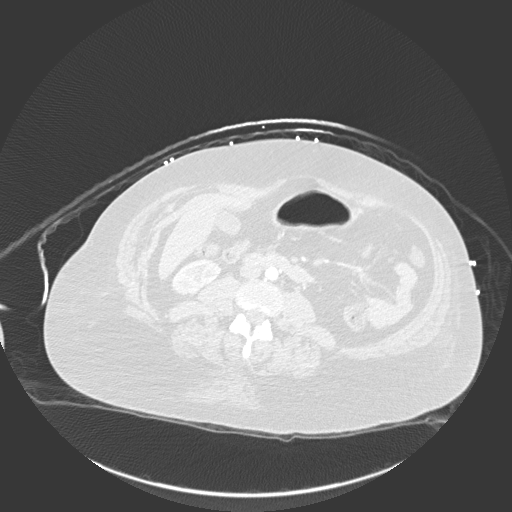
[im 36/336  soft-tissue]
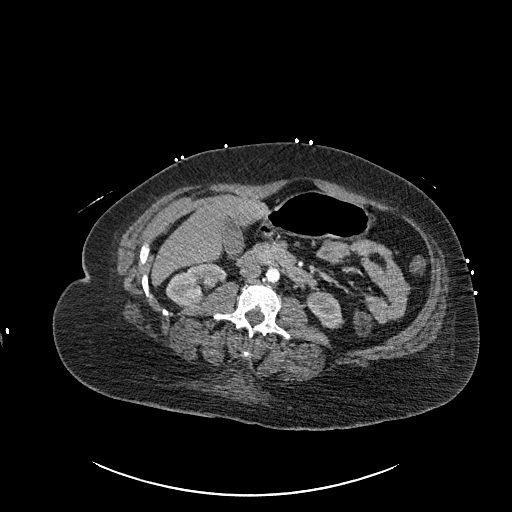
[im 53/336  lung]
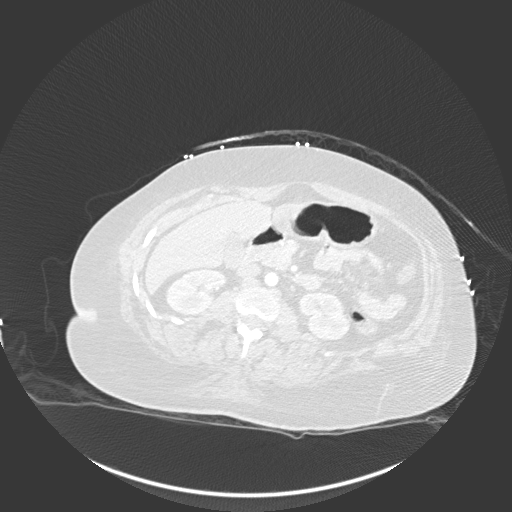
[im 71/336  soft-tissue]
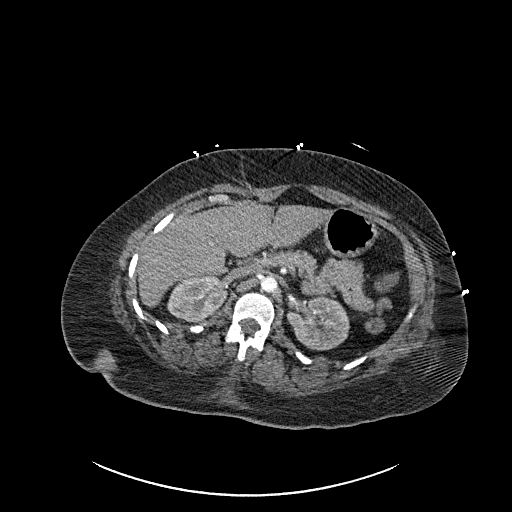
[im 106/336  lung]
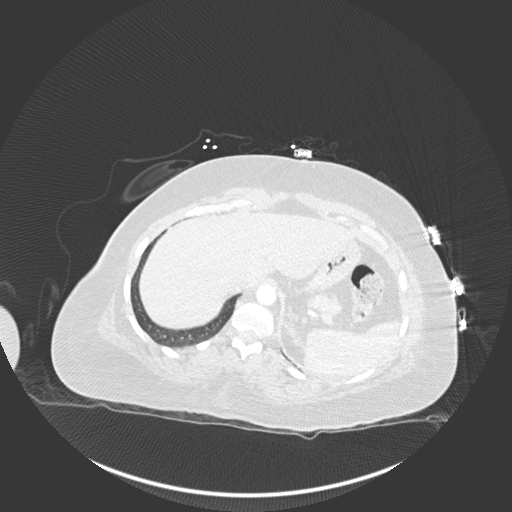
[im 124/336  soft-tissue]
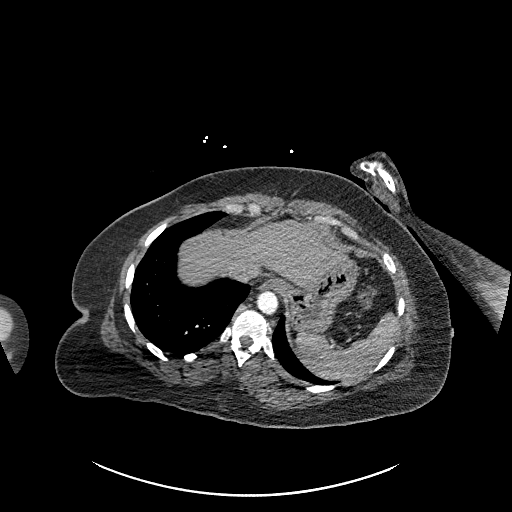
[im 142/336  lung]
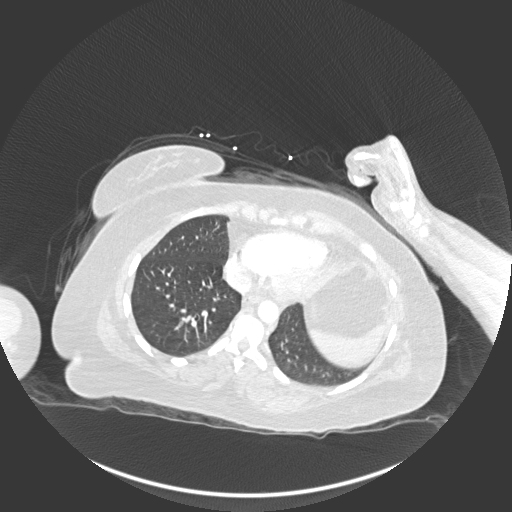
[im 159/336  soft-tissue]
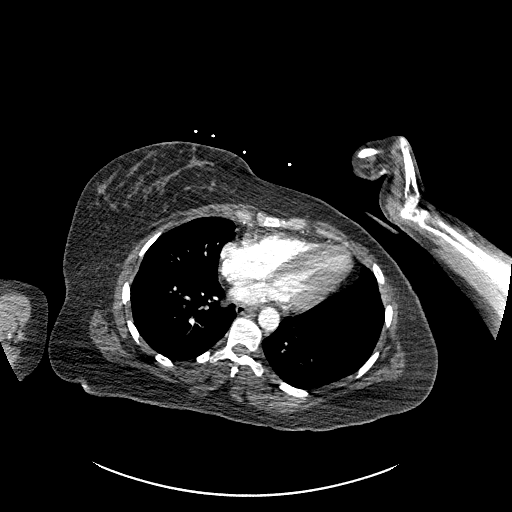
[im 177/336  lung]
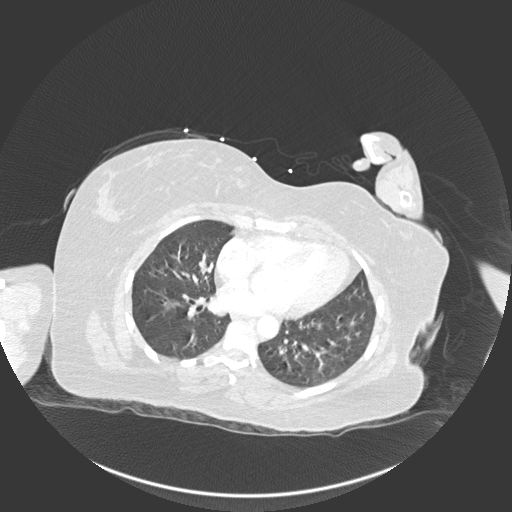
[im 194/336  soft-tissue]
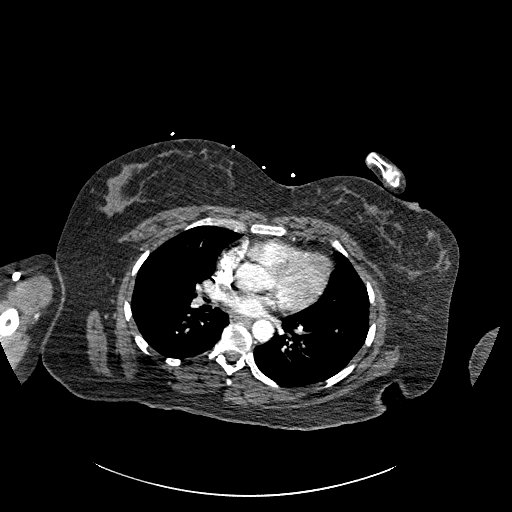
[im 212/336  lung]
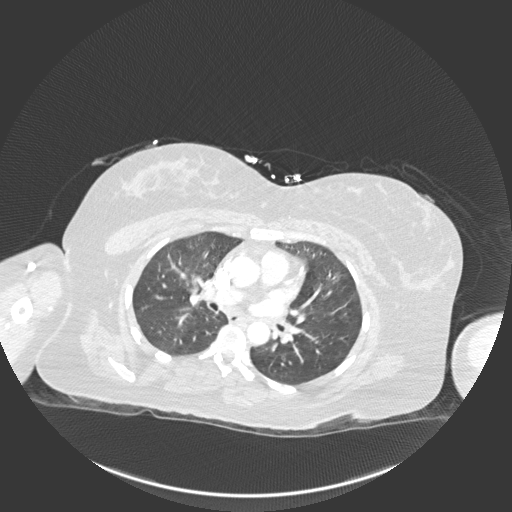
[im 230/336  soft-tissue]
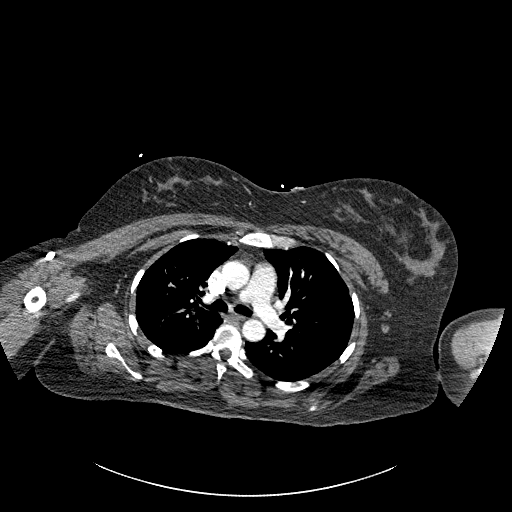
[im 265/336  lung]
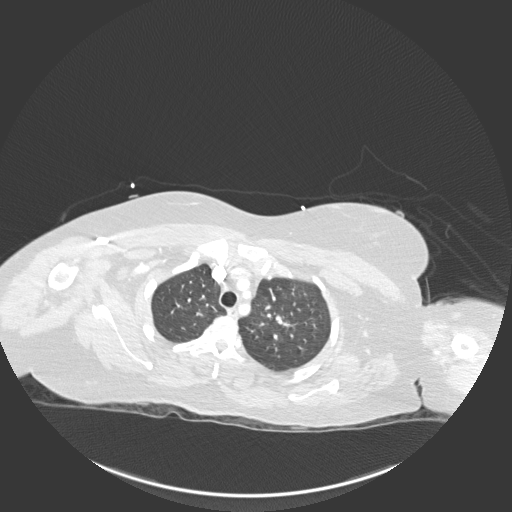
[im 283/336  soft-tissue]
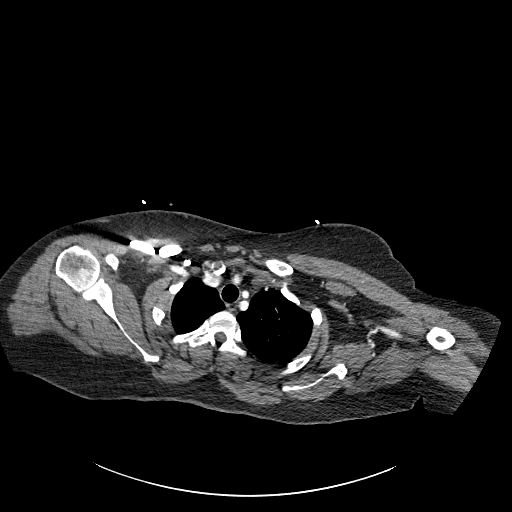
[im 300/336  lung]
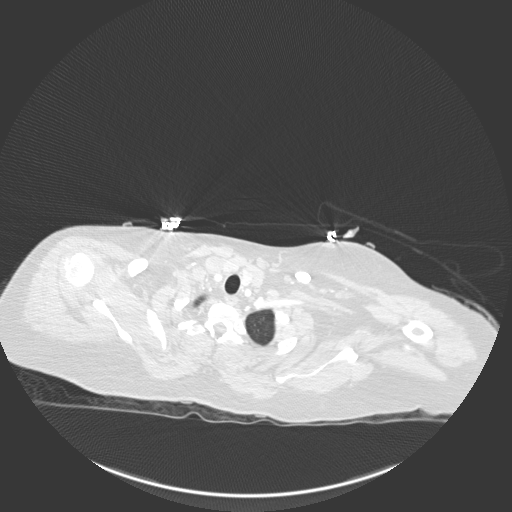
[im 318/336  soft-tissue]
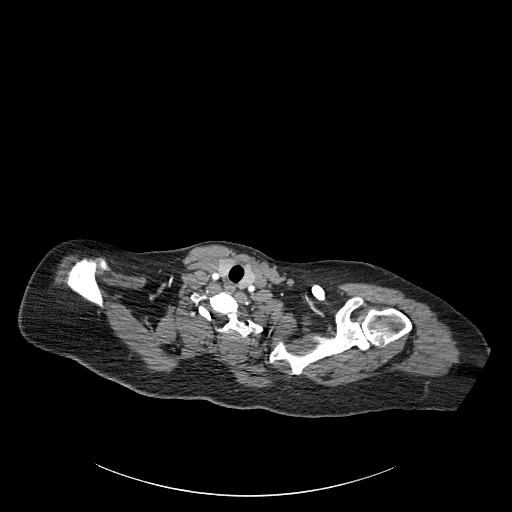

[Series 8: coronal mpr · coronal · 0.69mm/px · 3 of 151 slices shown]
[im 38/151  soft-tissue]
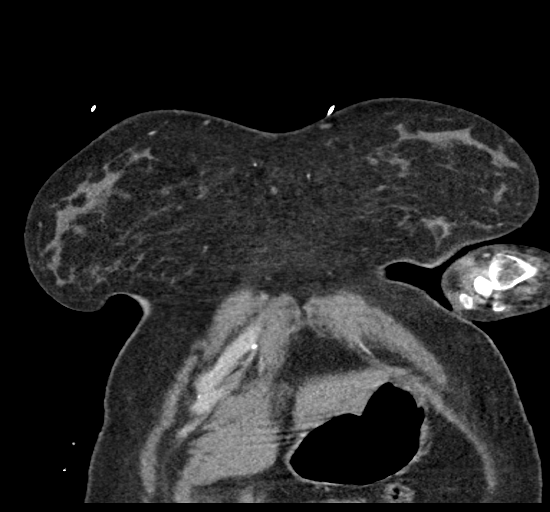
[im 76/151  soft-tissue]
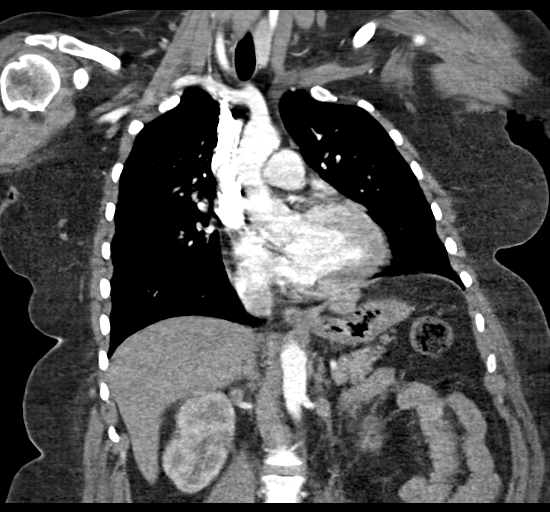
[im 113/151  soft-tissue]
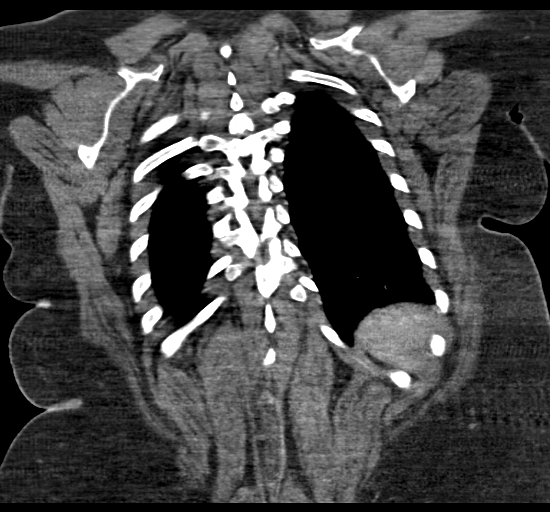

[19 of 46 positions shown; findings below may reference images not displayed]

RADIATION DOSE REDUCTION: This exam was performed according to the
departmental dose-optimization program which includes automated
exposure control, adjustment of the mA and/or kV according to
patient size and/or use of iterative reconstruction technique.

CONTRAST:  100mL OMNIPAQUE IOHEXOL 350 MG/ML SOLN
FINDINGS: Cardiovascular: There is homogeneous enhancement in thoracic aorta.
There are no intraluminal filling defects in the central pulmonary
artery branches. Evaluation of small peripheral branches is limited
by motion artifacts.

Mediastinum/Nodes: No significant lymphadenopathy seen.

Lungs/Pleura: There is no focal pulmonary consolidation. There is no
pleural effusion or pneumothorax.

Upper Abdomen: Unremarkable.

Musculoskeletal: Unremarkable.

Review of the MIP images confirms the above findings.
IMPRESSION: There is no evidence of central pulmonary artery embolism.
Evaluation of small peripheral branches is limited by motion
artifacts. There is no evidence of thoracic aortic dissection. There
is no focal pulmonary consolidation.

## 2022-01-12 IMAGING — MR MR CERVICAL SPINE WO/W CM
1 series · 18 of 40 positions shown · IV contrast (gadavist)
Comparison: [DATE]

CLINICAL DATA: Multiple sclerosis

EXAM:
MRI HEAD WITHOUT AND WITH CONTRAST
MRI CERVICAL SPINE WITHOUT AND WITH CONTRAST
MRI CERVICAL THORACIC WITHOUT AND CONTRAST
CONTRAST:  8mL GADAVIST GADOBUTROL 1 MMOL/ML IV SOLN
TECHNIQUE: Multiplanar, multiecho pulse sequences of the brain and surrounding
structures, and cervical and thoracic spine were obtained without
and with intravenous contrast.

[Series 14: T1 · axial · 3.0mm · 0.35mm/px · z∈[-252,-135]mm · 18 of 40 slices shown]
[im 1/40]
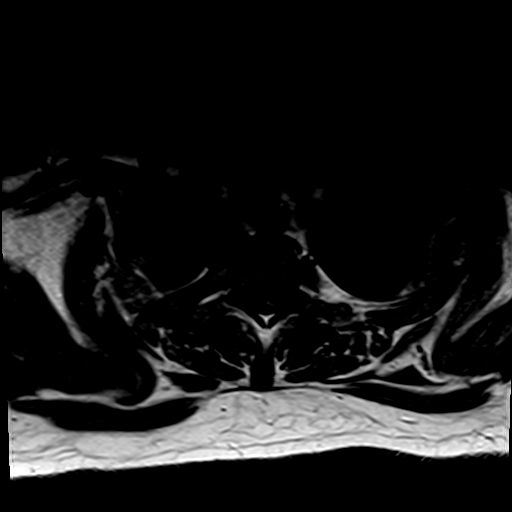
[im 2/40]
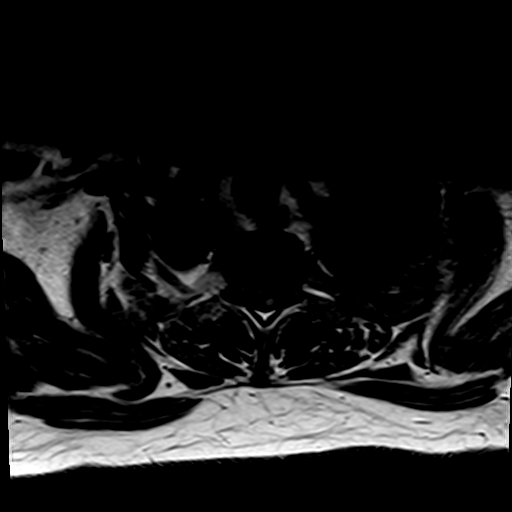
[im 3/40]
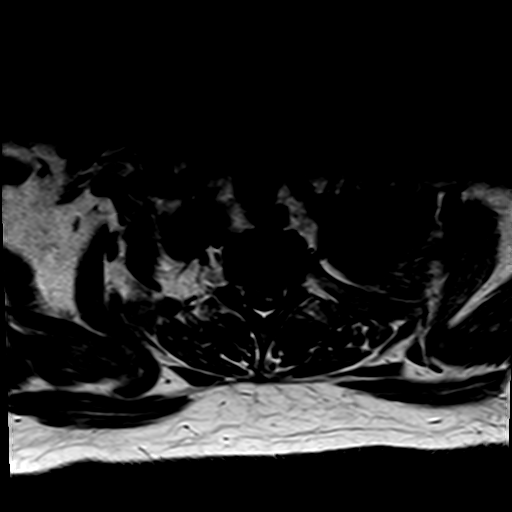
[im 4/40]
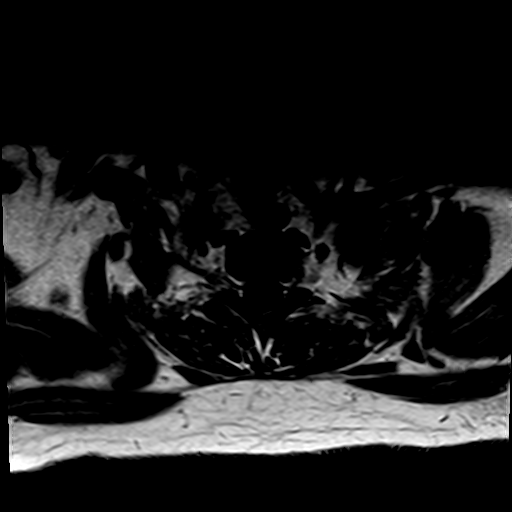
[im 5/40]
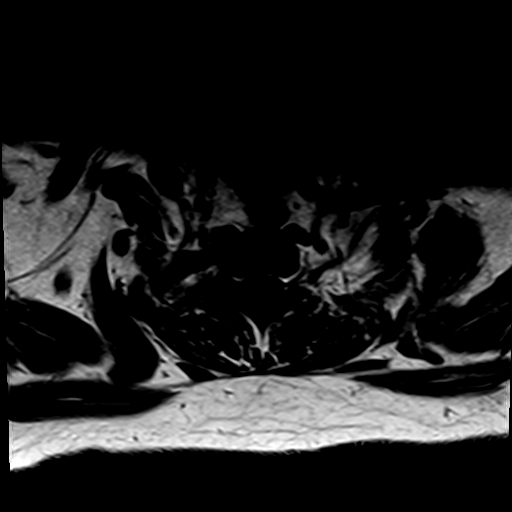
[im 6/40]
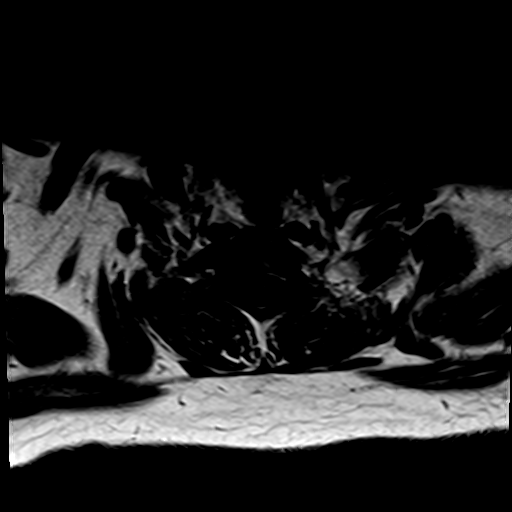
[im 7/40]
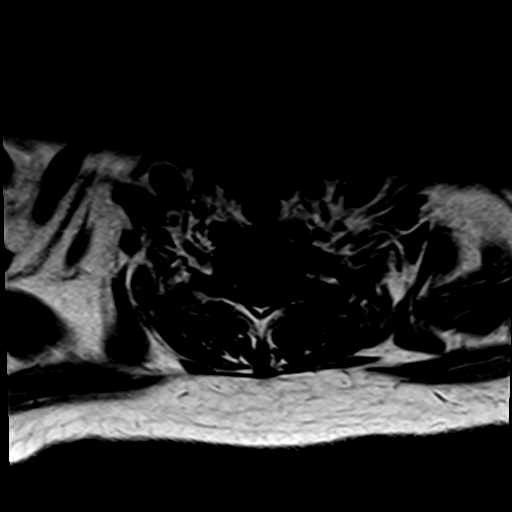
[im 8/40]
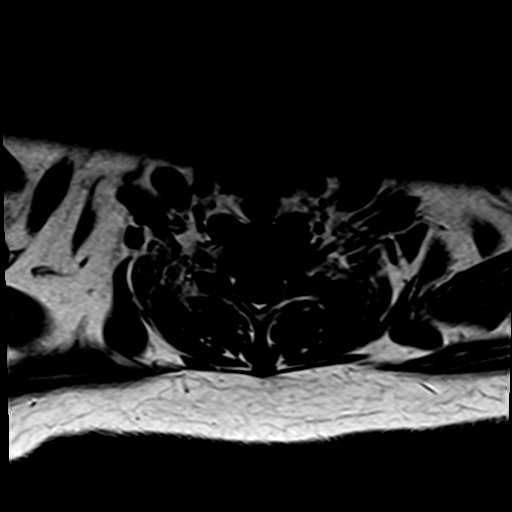
[im 9/40]
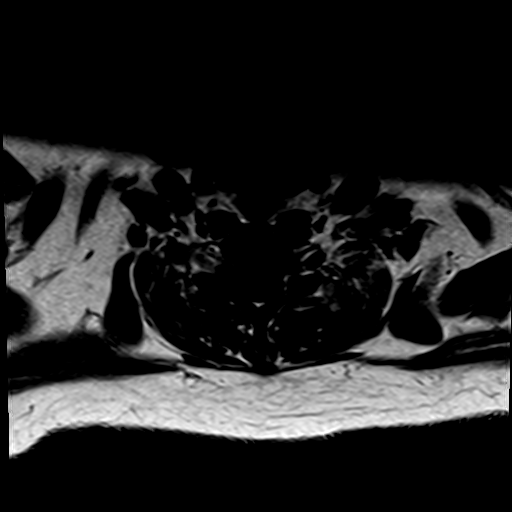
[im 10/40]
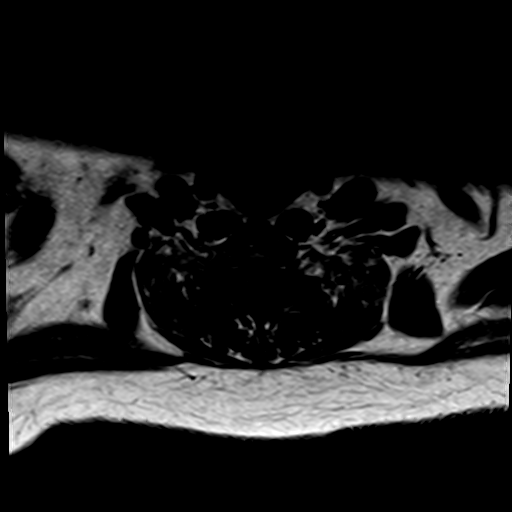
[im 13/40]
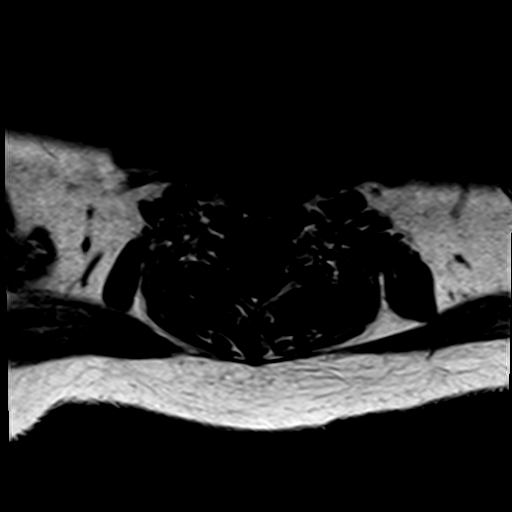
[im 18/40]
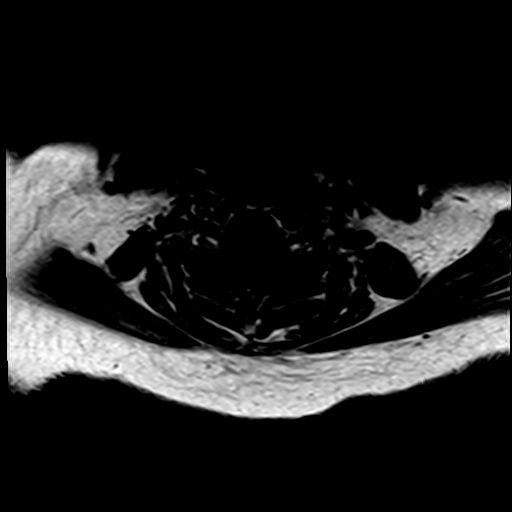
[im 21/40]
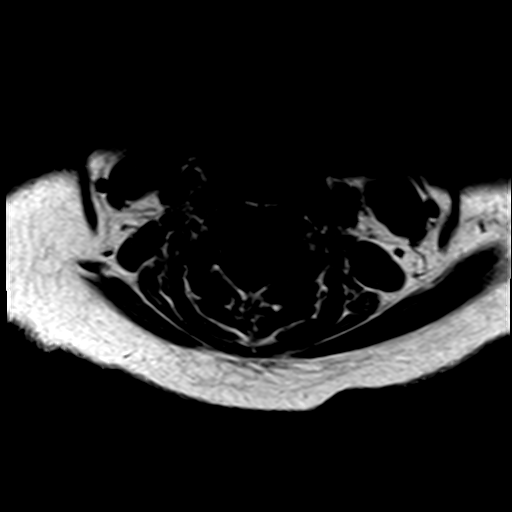
[im 23/40]
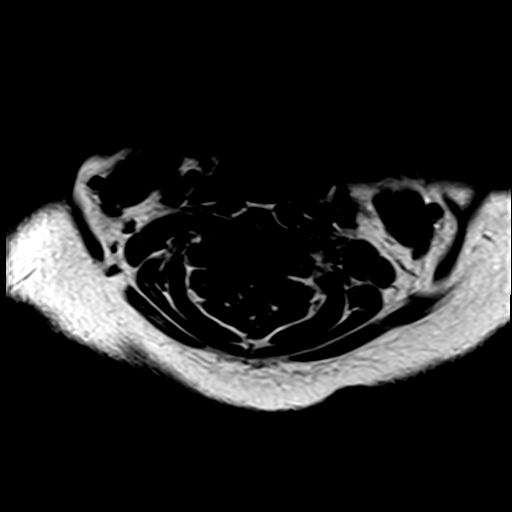
[im 28/40]
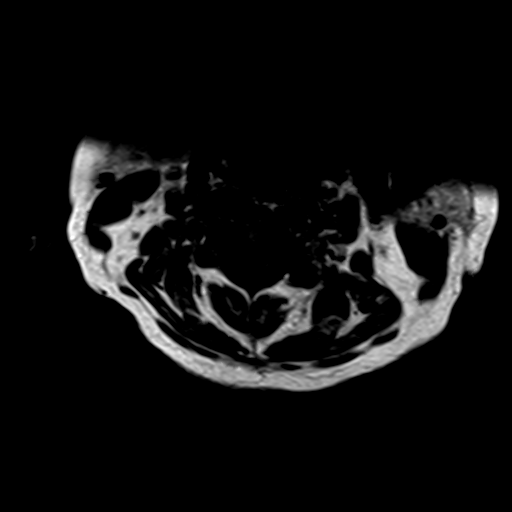
[im 33/40]
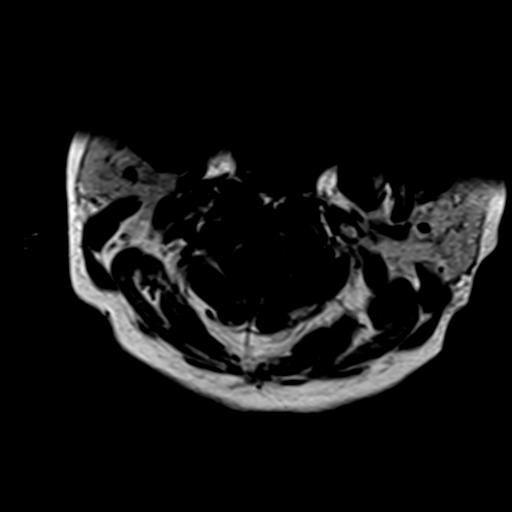
[im 34/40]
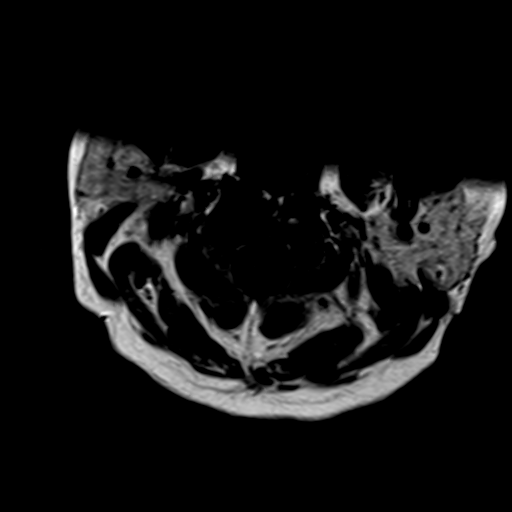
[im 38/40]
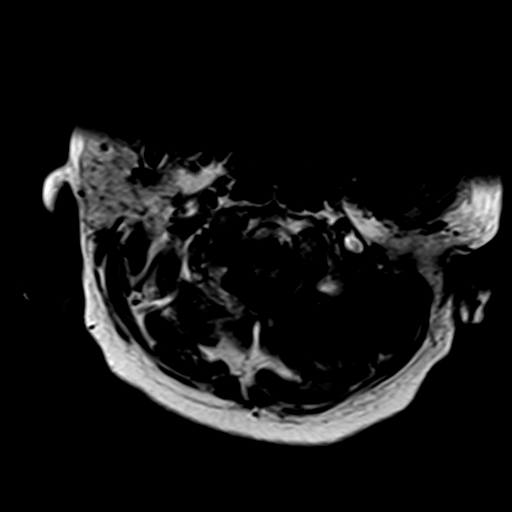

[18 of 40 positions shown; findings below may reference images not displayed]

FINDINGS: MRI HEAD FINDINGS

Brain: No acute infarct, mass effect or extra-axial collection. No
acute or chronic hemorrhage. Confluent bilateral periventricular and
pericallosal white matter lesions are unchanged. Lesions within both
cerebellar peduncles are also unchanged. The midline structures are
normal. There is no abnormal contrast enhancement.

Vascular: Major flow voids are preserved.

Skull and upper cervical spine: Normal calvarium and skull base.
Visualized upper cervical spine and soft tissues are normal.

Sinuses/Orbits:No paranasal sinus fluid levels or advanced mucosal
thickening. No mastoid or middle ear effusion. Normal orbits.

MRI CERVICAL SPINE FINDINGS

Alignment: Physiologic.

Vertebrae: No fracture, evidence of discitis, or bone lesion.

Cord: Chronic demyelinating lesions within the upper cervical spinal
cord at the C2 and C3 levels are unchanged. There are no enhancing
lesions.

Posterior Fossa, vertebral arteries, paraspinal tissues: Negative

Disc levels:

C1-2: Unremarkable.

C2-3: Normal disc space and facet joints. There is no spinal canal
stenosis. No neural foraminal stenosis.

C3-4: Normal disc space and facet joints. There is no spinal canal
stenosis. No neural foraminal stenosis.

C4-5: Unchanged small disc bulge with bilateral uncovertebral
osteophytes. There is no spinal canal stenosis. Unchanged mild
bilateral neural foraminal stenosis.

C5-6: Normal disc space and facet joints. There is no spinal canal
stenosis. No neural foraminal stenosis.

C6-7: Normal disc space and facet joints. There is no spinal canal
stenosis. No neural foraminal stenosis.

C7-T1: Normal disc space and facet joints. There is no spinal canal
stenosis. No neural foraminal stenosis.

MRI THORACIC SPINE FINDINGS

Alignment:  Physiologic.

Vertebrae: No fracture, evidence of discitis, or bone lesion.

Cord:  Normal signal and morphology.

Paraspinal and other soft tissues: Negative.

Disc levels: No spinal canal stenosis.
IMPRESSION: 1. Unchanged distribution of white matter lesions consistent with
multiple sclerosis. No evidence of active demyelination.
2. Unchanged appearance of chronic demyelinating lesions of the
upper cervical spinal cord.
3. No evidence of demyelinating disease within the thoracic spinal
cord.
4. Unchanged mild C4-5 degenerative disc disease with mild bilateral
neural foraminal stenosis.

## 2022-01-12 IMAGING — MR MR CERVICAL SPINE WO/W CM
6 of 8 series · 29 of 48 positions shown · IV contrast (Contrast agent)
Comparison: [DATE]

CLINICAL DATA: Multiple sclerosis

EXAM:
MRI HEAD WITHOUT AND WITH CONTRAST
MRI CERVICAL SPINE WITHOUT AND WITH CONTRAST
MRI CERVICAL THORACIC WITHOUT AND CONTRAST
CONTRAST:  8mL GADAVIST GADOBUTROL 1 MMOL/ML IV SOLN
TECHNIQUE: Multiplanar, multiecho pulse sequences of the brain and surrounding
structures, and cervical and thoracic spine were obtained without
and with intravenous contrast.

[Series 17: T2 · sagittal · 3.0mm · 0.69mm/px · 3 of 15 slices shown (1 of 2)]
[im 1/15]
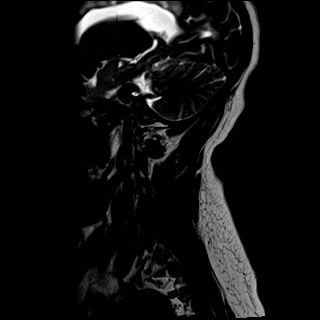
[im 8/15]
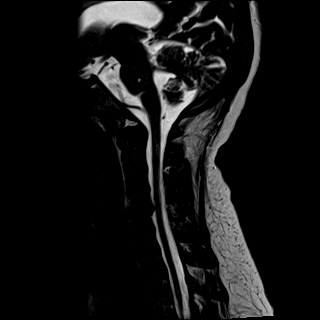
[im 15/15]
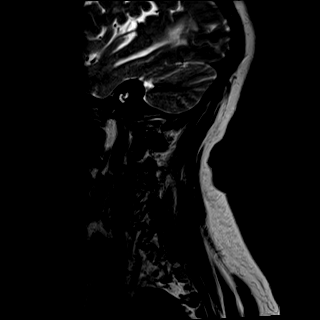

[Series 18: T1 · sagittal · 3.0mm · 0.69mm/px · 3 of 15 slices shown (1 of 2)]
[im 1/15]
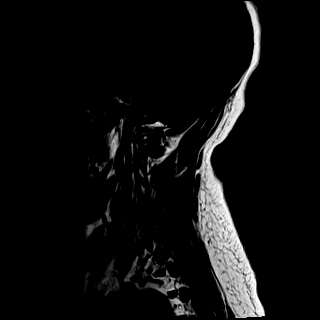
[im 8/15]
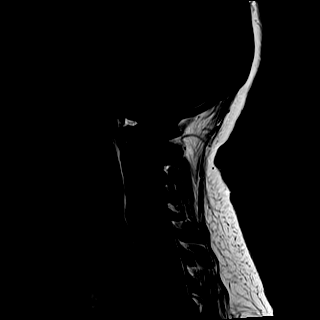
[im 15/15]
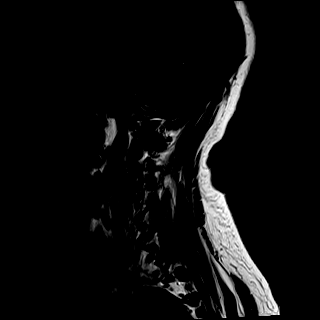

[Series 19: STIR · sagittal · 3.0mm · 0.86mm/px · 3 of 15 slices shown]
[im 1/15]
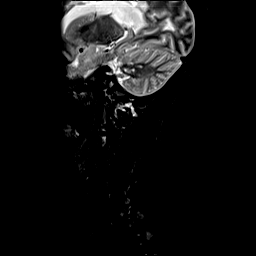
[im 8/15]
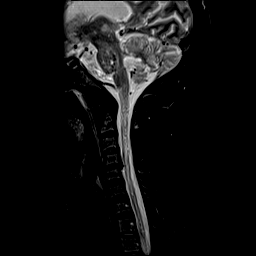
[im 15/15]
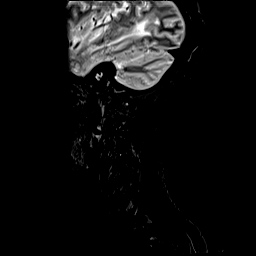

[Series 20: T2 · axial · 3.0mm · 0.66mm/px · z∈[-255,-131]mm · 9 of 40 slices shown (2 of 2)]
[im 1/40]
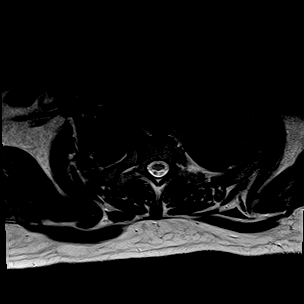
[im 5/40]
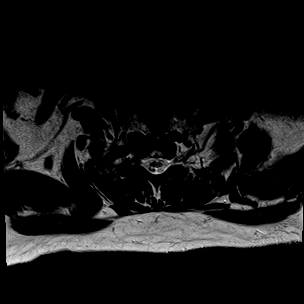
[im 10/40]
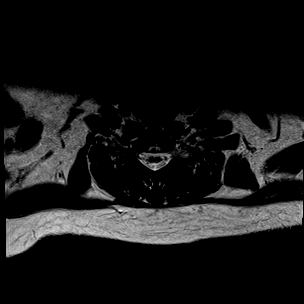
[im 15/40]
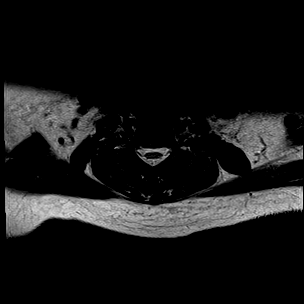
[im 20/40]
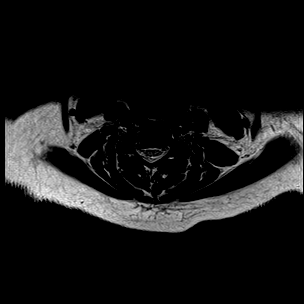
[im 25/40]
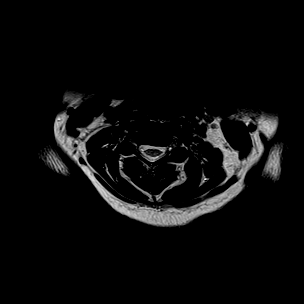
[im 30/40]
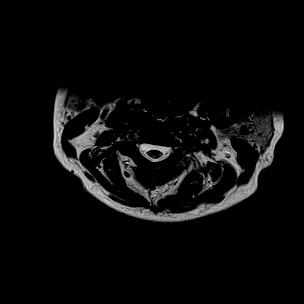
[im 35/40]
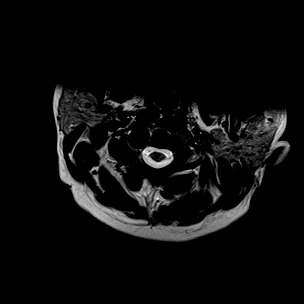
[im 40/40]
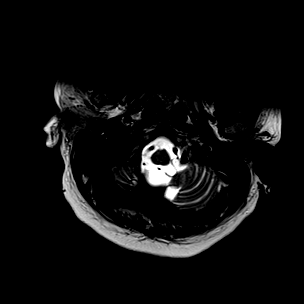

[Series 22: T1 · axial · 3.0mm · 0.35mm/px · z∈[-252,-128]mm · 9 of 40 slices shown (2 of 2)]
[im 1/40]
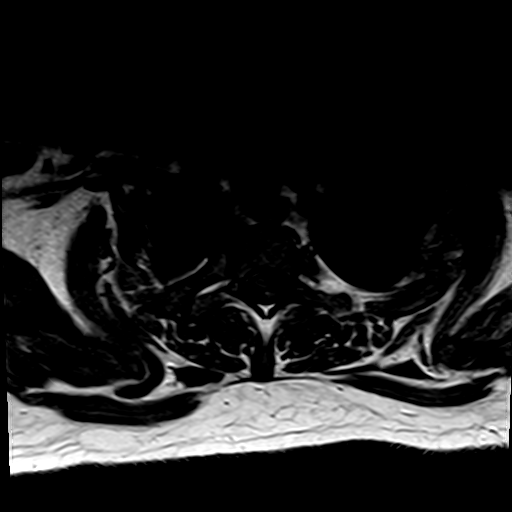
[im 5/40]
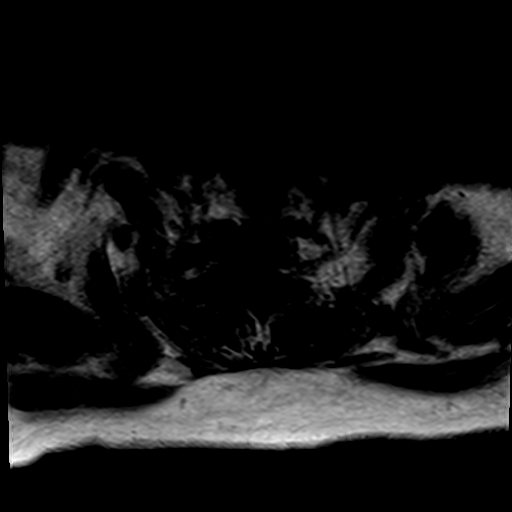
[im 10/40]
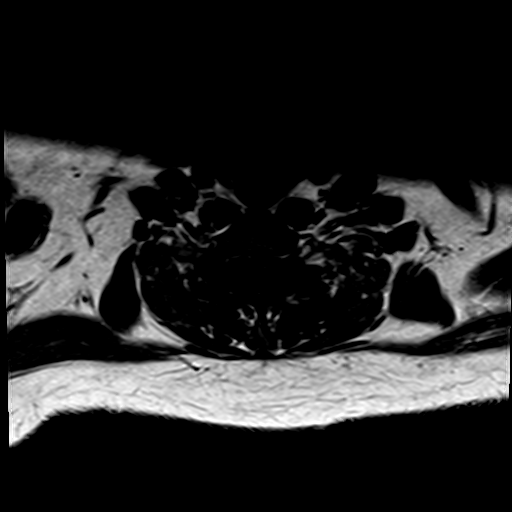
[im 15/40]
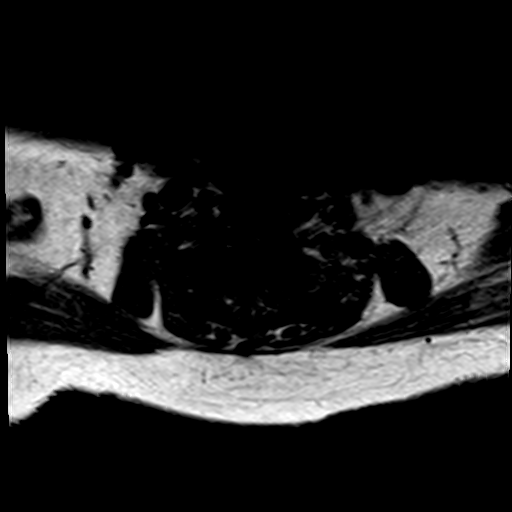
[im 20/40]
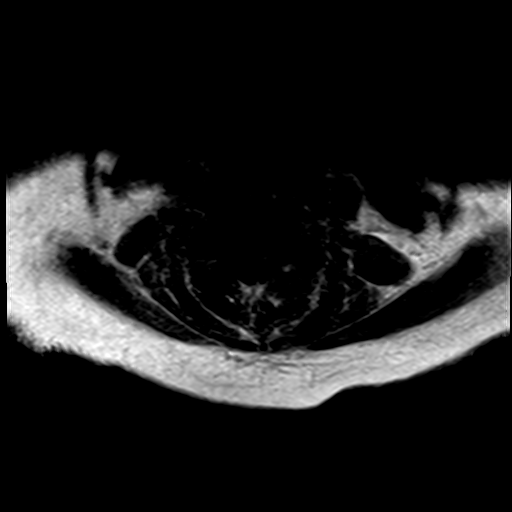
[im 25/40]
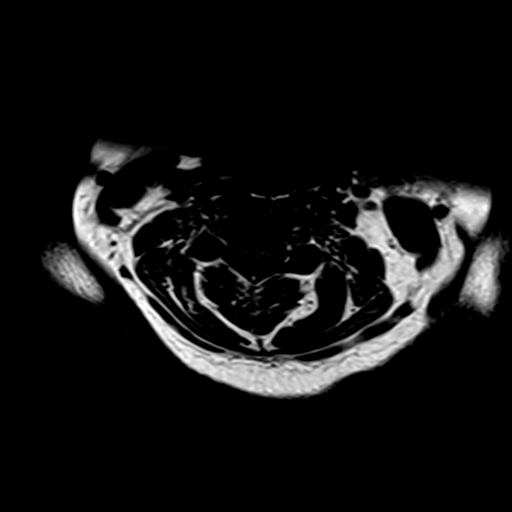
[im 30/40]
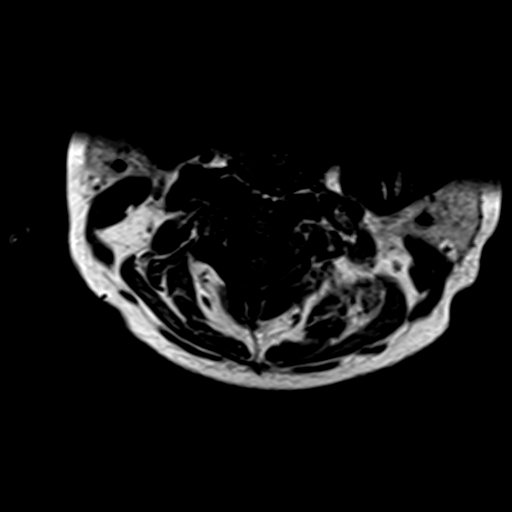
[im 35/40]
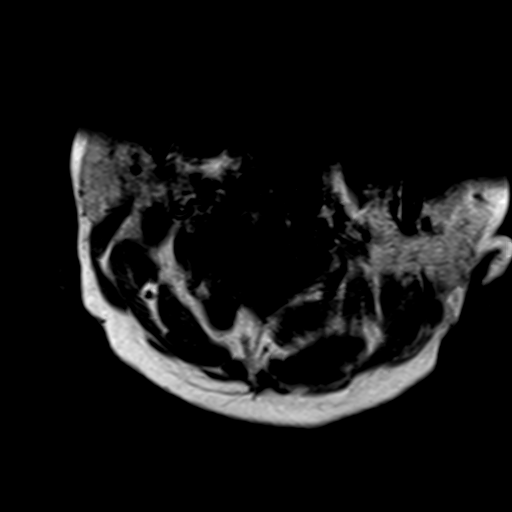
[im 40/40]
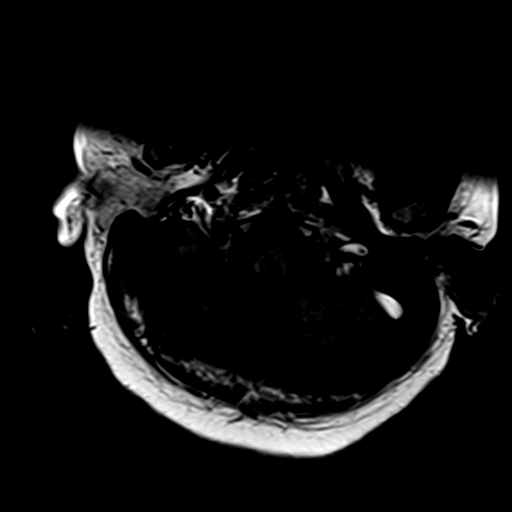

[Series 38: T1 post-contrast · sagittal · 3.0mm · 0.43mm/px · 2 of 15 slices shown]
[im 1/15]
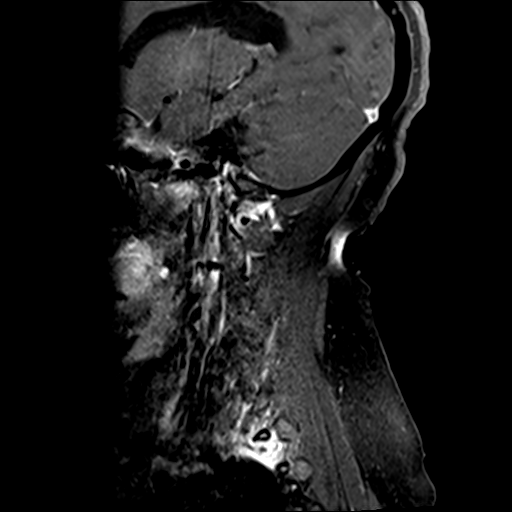
[im 8/15]
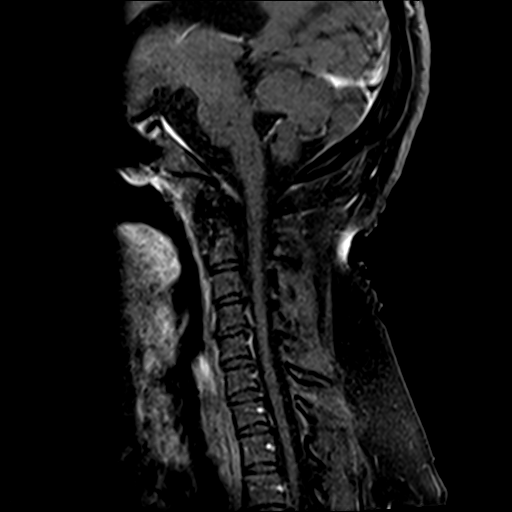

[29 of 48 positions shown; findings below may reference images not displayed]

FINDINGS: MRI HEAD FINDINGS

Brain: No acute infarct, mass effect or extra-axial collection. No
acute or chronic hemorrhage. Confluent bilateral periventricular and
pericallosal white matter lesions are unchanged. Lesions within both
cerebellar peduncles are also unchanged. The midline structures are
normal. There is no abnormal contrast enhancement.

Vascular: Major flow voids are preserved.

Skull and upper cervical spine: Normal calvarium and skull base.
Visualized upper cervical spine and soft tissues are normal.

Sinuses/Orbits:No paranasal sinus fluid levels or advanced mucosal
thickening. No mastoid or middle ear effusion. Normal orbits.

MRI CERVICAL SPINE FINDINGS

Alignment: Physiologic.

Vertebrae: No fracture, evidence of discitis, or bone lesion.

Cord: Chronic demyelinating lesions within the upper cervical spinal
cord at the C2 and C3 levels are unchanged. There are no enhancing
lesions.

Posterior Fossa, vertebral arteries, paraspinal tissues: Negative

Disc levels:

C1-2: Unremarkable.

C2-3: Normal disc space and facet joints. There is no spinal canal
stenosis. No neural foraminal stenosis.

C3-4: Normal disc space and facet joints. There is no spinal canal
stenosis. No neural foraminal stenosis.

C4-5: Unchanged small disc bulge with bilateral uncovertebral
osteophytes. There is no spinal canal stenosis. Unchanged mild
bilateral neural foraminal stenosis.

C5-6: Normal disc space and facet joints. There is no spinal canal
stenosis. No neural foraminal stenosis.

C6-7: Normal disc space and facet joints. There is no spinal canal
stenosis. No neural foraminal stenosis.

C7-T1: Normal disc space and facet joints. There is no spinal canal
stenosis. No neural foraminal stenosis.

MRI THORACIC SPINE FINDINGS

Alignment:  Physiologic.

Vertebrae: No fracture, evidence of discitis, or bone lesion.

Cord:  Normal signal and morphology.

Paraspinal and other soft tissues: Negative.

Disc levels: No spinal canal stenosis.
IMPRESSION: 1. Unchanged distribution of white matter lesions consistent with
multiple sclerosis. No evidence of active demyelination.
2. Unchanged appearance of chronic demyelinating lesions of the
upper cervical spinal cord.
3. No evidence of demyelinating disease within the thoracic spinal
cord.
4. Unchanged mild C4-5 degenerative disc disease with mild bilateral
neural foraminal stenosis.

## 2022-01-12 MED ORDER — LEVALBUTEROL HCL 0.63 MG/3ML IN NEBU
0.6300 mg | INHALATION_SOLUTION | Freq: Three times a day (TID) | RESPIRATORY_TRACT | Status: DC
  Administered 2022-01-13 (×2): 0.63 mg via RESPIRATORY_TRACT
  Filled 2022-01-12 (×3): qty 3

## 2022-01-12 MED ORDER — ACETAMINOPHEN 325 MG PO TABS: 650.0000 mg | ORAL_TABLET | Freq: Four times a day (QID) | ORAL | Status: DC | PRN

## 2022-01-12 MED ORDER — IPRATROPIUM-ALBUTEROL 0.5-2.5 (3) MG/3ML IN SOLN
3.0000 mL | Freq: Once | RESPIRATORY_TRACT | Status: AC
  Administered 2022-01-12: 3 mL via RESPIRATORY_TRACT
  Filled 2022-01-12: qty 3

## 2022-01-12 MED ORDER — IOHEXOL 350 MG/ML SOLN
100.0000 mL | Freq: Once | INTRAVENOUS | Status: AC | PRN
  Administered 2022-01-12: 100 mL via INTRAVENOUS

## 2022-01-12 MED ORDER — HYDROCHLOROTHIAZIDE 25 MG PO TABS
25.0000 mg | ORAL_TABLET | Freq: Every day | ORAL | Status: DC
  Administered 2022-01-12 – 2022-01-16 (×5): 25 mg via ORAL
  Filled 2022-01-12 (×5): qty 1

## 2022-01-12 MED ORDER — PREDNISONE 20 MG PO TABS
40.0000 mg | ORAL_TABLET | Freq: Every day | ORAL | Status: AC
  Administered 2022-01-13 – 2022-01-16 (×4): 40 mg via ORAL
  Filled 2022-01-12 (×4): qty 2

## 2022-01-12 MED ORDER — GADOBUTROL 1 MMOL/ML IV SOLN
8.0000 mL | Freq: Once | INTRAVENOUS | Status: AC | PRN
Start: 2022-01-12 — End: 2022-01-12
  Administered 2022-01-12: 8 mL via INTRAVENOUS

## 2022-01-12 MED ORDER — ACETAMINOPHEN 650 MG RE SUPP: 650.0000 mg | Freq: Four times a day (QID) | RECTAL | Status: DC | PRN

## 2022-01-12 MED ORDER — BACLOFEN 10 MG PO TABS
10.0000 mg | ORAL_TABLET | Freq: Four times a day (QID) | ORAL | Status: DC
  Administered 2022-01-12 – 2022-01-16 (×16): 10 mg via ORAL
  Filled 2022-01-12 (×17): qty 1

## 2022-01-12 MED ORDER — SODIUM CHLORIDE 0.9 % IV BOLUS
1000.0000 mL | Freq: Once | INTRAVENOUS | Status: AC
  Administered 2022-01-12: 1000 mL via INTRAVENOUS

## 2022-01-12 MED ORDER — GUAIFENESIN-DM 100-10 MG/5ML PO SYRP
5.0000 mL | ORAL_SOLUTION | ORAL | Status: DC | PRN
  Administered 2022-01-13 – 2022-01-14 (×4): 5 mL via ORAL
  Filled 2022-01-12 (×4): qty 5

## 2022-01-12 MED ORDER — IPRATROPIUM BROMIDE 0.02 % IN SOLN
0.5000 mg | RESPIRATORY_TRACT | Status: DC | PRN
  Administered 2022-01-12 – 2022-01-13 (×2): 0.5 mg via RESPIRATORY_TRACT
  Filled 2022-01-12 (×2): qty 2.5

## 2022-01-12 MED ORDER — ENOXAPARIN SODIUM 40 MG/0.4ML IJ SOSY
40.0000 mg | PREFILLED_SYRINGE | INTRAMUSCULAR | Status: DC
  Administered 2022-01-12 – 2022-01-15 (×4): 40 mg via SUBCUTANEOUS
  Filled 2022-01-12 (×4): qty 0.4

## 2022-01-12 NOTE — Progress Notes (Signed)
   01/12/22 1743  Assess: MEWS Score  Temp 98.3 F (36.8 C)  BP (!) 154/97  MAP (mmHg) 114  ECG Heart Rate (!) 129  Resp 20  Level of Consciousness Alert  SpO2 94 %  O2 Device Room Air  Assess: MEWS Score  MEWS Temp 0  MEWS Systolic 0  MEWS Pulse 2  MEWS RR 0  MEWS LOC 0  MEWS Score 2  MEWS Score Color Yellow  Assess: if the MEWS score is Yellow or Red  Were vital signs taken at a resting state? Yes  Focused Assessment No change from prior assessment  Does the patient meet 2 or more of the SIRS criteria? No  MEWS guidelines implemented *See Row Information* Yes  Treat  Pain Scale 0-10  Pain Score 0  Take Vital Signs  Increase Vital Sign Frequency  Yellow: Q 2hr X 2 then Q 4hr X 2, if remains yellow, continue Q 4hrs  Escalate  MEWS: Escalate Yellow: discuss with charge nurse/RN and consider discussing with provider and RRT  Notify: Charge Nurse/RN  Name of Charge Nurse/RN Notified john  Date Charge Nurse/RN Notified 01/12/22  Time Charge Nurse/RN Notified 1743  Notify: Provider  Provider Name/Title Internal med resident  Date Provider Notified 01/12/22  Time Provider Notified 1743  Method of Notification Page  Notification Reason Other (Comment) (high HR)  Provider response No new orders  Date of Provider Response 01/12/22  Time of Provider Response 1744  Assess: SIRS CRITERIA  SIRS Temperature  0  SIRS Pulse 1  SIRS Respirations  0  SIRS WBC 0  SIRS Score Sum  1

## 2022-01-12 NOTE — H&P (Cosign Needed Addendum)
Date: 01/12/2022     Patient Name:  Lindsay Schneider MRN: [id number]  DOB: 1986/01/19 Age / Sex: 36 y.o., female   PCP: Patient, No Pcp Per         Medical Service: Internal Medicine Teaching Service       Attending Physician: Dr. Mercie Eon, MD    Intern (1st Contact): Park Pope, MD Pager: [phone]  Resident (2nd Contact): Doran Stabler, DO Pager: Gennaro Africa [phone]       After Hours (After 5p/  First Contact Pager: [phone]  weekends / holidays): Second Contact Pager: [phone]   SUBJECTIVE:  Chief Complaint: Dyspnea  History of Present Illness: Lindsay Schneider is a functionally limited 36 y.o. 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  Patient reports that for the past week she has had intermittent worsening dyspnea.  Patient reports that she has attempted to use cough syrup, albuterol nebulizer, albuterol inhaler with limited benefit.  Patient does report that she uses albuterol approximately 2-3 times daily each time 3 puffs.  Patient reports that dyspnea worsens with lying flat and will occasionally wake her up from sleep.  Patient also endorses some wheezing and occasional chest palpitations with dyspnea.  Patient denies any cough, rhinorrhea or sputum production.  Patient also reports associated weakness that she attributes due to her dyspnea.  Patient denies any sick contacts.  Patient denies any fever or chills.  Patient has never had an official diagnosis of asthma or COPD.  Of note, patient did present to [facility] ED on 12/02/2021 complaining of dyspnea  Patient also does endorse that for the past month, patient has noticed that her right lower extremity has progressively weakened for past several weeks. Patient reports that her right leg usually would be the leg she puts weight on but has recently been unable to due to progressive weakness.  In addition, patient reports paresthesia around right  posterior thigh that was also new for her.  Patient denies any problems with bowel movements or urination.  Patient reports having been diagnosed with MS since 2011.  Patient reports that she is normally wheelchair bound due to this as well as persistent left upper extremity contracture and left lower extremity weakness.  Patient regularly follows with [name] for her MS, next follow up visit in mid July.  Patient reports regularly taking MS DMARDs medication every 6 months but forgot medication name.  Patient also takes dalfampridine 10 mg twice daily and phentermine daily for MS symptom management.  On route to the ED, patient received DuoNebs and 125 mg IV Solu-Medrol which led to subjective improvement of dyspnea.  Has been persistently tachycardic in the 120s to 130s with mildly elevated blood pressure around 150s/100s.  Patient received an additional dose of DuoNeb while in the ED.  CT PE was performed and ruled out focal pulmonary consolidation or pulmonary artery embolism. Medications: No current facility-administered medications on file prior to encounter.   Current Outpatient Medications on File Prior to Encounter  Medication Sig Dispense Refill   acetaminophen (TYLENOL) 500 MG tablet Take 1,000 mg by mouth every 6 (six) hours as needed for mild pain.     albuterol (PROVENTIL) (2.5 MG/3ML) 0.083% nebulizer solution INHALE 3 ML BY NEBULIZATION EVERY 6 HOURS AS NEEDED FOR WHEEZING OR SHORTNESS OF BREATH 75 mL 12   albuterol (VENTOLIN HFA) 108 (90 Base) MCG/ACT inhaler Inhale 2 puffs into the lungs every 6 (six)  hours as needed for wheezing or shortness of breath. 8 g 2   baclofen (LIORESAL) 10 MG tablet TAKE 1 TO 2 TABLETS 4 TIMES A DAY (Patient taking differently: Take 10 mg by mouth 4 (four) times daily.) 720 tablet 3   dalfampridine 10 MG TB12 TAKE 1 TABLET BY MOUTH 2 TIMES A DAY (APPROXIMATELY 12 HOURS APART) (Patient taking differently: Take 10 mg by mouth 2 (two) times daily.) 180 tablet  3   guaiFENesin-dextromethorphan (ROBITUSSIN DM) 100-10 MG/5ML syrup Take 5 mLs by mouth every 4 (four) hours as needed for cough. 118 mL 0   hydrochlorothiazide (HYDRODIURIL) 25 MG tablet TAKE 1 TABLET (25 MG TOTAL) BY MOUTH DAILY. 90 tablet 0   phentermine 37.5 MG capsule TAKE 1 CAPSULE BY MOUTH EVERY DAY IN THE MORNING 30 capsule 0   Vitamin D, Ergocalciferol, (DRISDOL) 1.25 MG (50000 UNIT) CAPS capsule TAKE 1 CAPSULE (50,000 UNITS TOTAL) BY MOUTH EVERY 7 (SEVEN) DAYS (Patient taking differently: Take 50,000 Units by mouth every Monday.) 13 capsule 2   benzonatate (TESSALON) 100 MG capsule Take 100 mg by mouth 3 (three) times daily. (Patient not taking: Reported on 01/12/2022)      Past Medical History: Past Medical History:  Diagnosis Date   Bilateral leg weakness 04/27/2015   Left foot drop 02/08/2017   Movement disorder    MS (multiple sclerosis) (HCC)    Spastic hemiplegia affecting left nondominant side ([place]) 12/27/2016    Social:  Lives - [address] [place],  [place] [id number], lives with children Support - Good Level of function: Needed some help,use a wheelchair, needs assistance with bathing and hygiene and dressing and has aid for IADLs PCP - Dr. Clovia Cuff Substance use: Nonprescription/Illicit - denied.  ETOH - none Tobacco - denied  Family History: Family History  Problem Relation Age of Onset   Asthma Mother    Hypertension Father    Healthy Sister    Healthy Brother     Allergies: Allergies as of 01/12/2022   (No Known Allergies)    Review of Systems: A complete ROS was negative except as per HPI.   OBJECTIVE:  Physical Exam: Blood pressure (!) 154/97, pulse (!) 131, temperature 98.3 F (36.8 C), temperature source Oral, resp. rate 20, height 5\' 2"  (1.575 m), weight 76 kg, SpO2 94 %. Physical Exam Constitutional:      Appearance: She is obese.  Eyes:     Extraocular Movements: Extraocular movements intact.     Pupils: Pupils are equal, round,  and reactive to light.  Cardiovascular:     Rate and Rhythm: Normal rate and regular rhythm.  Pulmonary:     Effort: Pulmonary effort is normal.     Breath sounds: Normal breath sounds. No decreased breath sounds, wheezing, rhonchi or rales.  Chest:     Chest wall: No tenderness.  Abdominal:     General: Bowel sounds are normal.     Palpations: Abdomen is soft.  Neurological:     Mental Status: She is alert and oriented to person, place, and time. Mental status is at baseline.     Comments: CN 2-12 intact.  Nystagmus with left ward gaze. Sensory symmetric and intact.  Does endorse paresthesia in right posterior thigh on palpation. Strength 5 out of 5 RUE, persistently contracted left upper extremity.  BLE weakness and persistently plantarflexed.  Minimal movement of distal toes.     Pertinent Labs: CBC    Component Value Date/Time   WBC 11.5 (H) 01/12/2022  1015   RBC 4.58 01/12/2022 1015   HGB 12.3 01/12/2022 1015   HGB 12.6 08/02/2021 1544   HCT 37.5 01/12/2022 1015   HCT 37.6 08/02/2021 1544   PLT 267 01/12/2022 1015   PLT 256 08/02/2021 1544   MCV 81.9 01/12/2022 1015   MCV 83 08/02/2021 1544   MCH 26.9 01/12/2022 1015   MCHC 32.8 01/12/2022 1015   RDW 14.6 01/12/2022 1015   RDW 14.0 08/02/2021 1544   LYMPHSABS 2.0 01/12/2022 1015   LYMPHSABS 2.0 08/02/2021 1544   MONOABS 0.9 01/12/2022 1015   EOSABS 1.0 (H) 01/12/2022 1015   EOSABS 0.2 08/02/2021 1544   BASOSABS 0.1 01/12/2022 1015   BASOSABS 0.0 08/02/2021 1544     CMP     Component Value Date/Time   NA 136 01/12/2022 1015   NA 138 08/02/2021 1544   K 3.5 01/12/2022 1015   CL 104 01/12/2022 1015   CO2 20 (L) 01/12/2022 1015   GLUCOSE 114 (H) 01/12/2022 1015   BUN <5 (L) 01/12/2022 1015   BUN 6 08/02/2021 1544   CREATININE 0.56 01/12/2022 1015   CALCIUM 9.2 01/12/2022 1015   PROT 7.0 08/02/2021 1544   ALBUMIN 4.4 08/02/2021 1544   AST 15 08/02/2021 1544   ALT 17 08/02/2021 1544   ALKPHOS 94  08/02/2021 1544   BILITOT 0.4 08/02/2021 1544   GFRNONAA >60 01/12/2022 1015   GFRAA 144 05/21/2019 1057    Pertinent Imaging: CT Angio Chest PE W and/or Wo Contrast  Result Date: 01/12/2022 CLINICAL DATA:  Shortness of breath, cough EXAM: CT ANGIOGRAPHY CHEST WITH CONTRAST TECHNIQUE: Multidetector CT imaging of the chest was performed using the standard protocol during bolus administration of intravenous contrast. Multiplanar CT image reconstructions and MIPs were obtained to evaluate the vascular anatomy. RADIATION DOSE REDUCTION: This exam was performed according to the departmental dose-optimization program which includes automated exposure control, adjustment of the mA and/or kV according to patient size and/or use of iterative reconstruction technique. CONTRAST:  OMNIPAQUE IOHEXOL 350 MG/ML SOLN COMPARISON:  Previous studies including the chest radiographs done earlier today FINDINGS: Cardiovascular: There is homogeneous enhancement in thoracic aorta. There are no intraluminal filling defects in the central pulmonary artery branches. Evaluation of small peripheral branches is limited by motion artifacts. Mediastinum/Nodes: No significant lymphadenopathy seen. Lungs/Pleura: There is no focal pulmonary consolidation. There is no pleural effusion or pneumothorax. Upper Abdomen: Unremarkable. Musculoskeletal: Unremarkable. Review of the MIP images confirms the above findings. IMPRESSION: There is no evidence of central pulmonary artery embolism. Evaluation of small peripheral branches is limited by motion artifacts. There is no evidence of thoracic aortic dissection. There is no focal pulmonary consolidation. Electronically Signed   By: Ernie Avena M.D.   On: 01/12/2022 12:32   DG Chest Port 1 View  Result Date: 01/12/2022 CLINICAL DATA:  Shortness of breath for 1 month EXAM: PORTABLE CHEST 1 VIEW COMPARISON:  12/02/2021 FINDINGS: The heart size and mediastinal contours are within normal  limits. Both lungs are clear. The visualized skeletal structures are unremarkable. IMPRESSION: No acute abnormality of the lungs in AP portable projection. Electronically Signed   By: Jearld Lesch M.D.   On: 01/12/2022 10:46    EKG: unchanged from previous tracings, sinus tachycardia  ASSESSMENT & PLAN:  Patient Summary: Lindsay Schneider is a functionally limited 36 y.o. female with a pertinent PMH of multiple sclerosis and hypertension, who presents to [facility] with dyspnea and admitted for possible asthma exacerbation, subacute MS flare, and  persistent tachycardia.  Assessment: Principal Problem:   Acute asthma exacerbation   Plan: Dyspnea Possible asthma exacerbation Patient is not yet had formal diagnosis of asthma.  Mild wheezing noted on examination but overall this appears to be doing well after 2 doses of DuoNebs as well as IV Solu-Medrol.  Patient has not required any further supplemental oxygen and has only been mildly tachypneic since being in the hospital.  Will monitor overnight to see if any worsening further episodes of dyspnea.  Patient had very similar presentation in early May but appears to never have followed up with outpatient PCP.  Patient does have a family history of asthma.  Chest x-ray clear.  No leukocytosis. -Admit to [place], attending Dr. Cain Sieve -Vitals per floor -Start prednisone 40 mg qd x 5 days -Ipratropium nebulizer every 4 hours as needed for wheezing and dyspnea -Outpatient PFTs  Chronic sinus tachycardia Patient's heart rate appears to be chronically elevated to 130-140.  Appears to be asymptomatic.  Pulmonary embolism ruled out.  Possibly secondary to significant albuterol use.  Appears euvolemic and does not appear to be infectious.  TSH last admission was unchanged.  Differential also includes phentermine use and anxiety but patient does endorse having used phentermine for years and patient did not appear to acutely anxious on presentation.  Very unlikely  that MS is etiology of tachycardia. We will again rule out any other etiologies to tachycardia prior to presuming idiopathic. -TSH -Echo could not be obtained today due to increased heart rate, will re-attempt tomorrow once heart rate better controlled -Consider Metoprolol or propranolol for tachycardia/possible anxiety  Hypertension -Resume home HCTZ 25 mg daily  Multiple sclerosis Concern for MS Flare Given patient's endorsement of subacute worsening paresthesia of right posterior thigh and right lower extremity weakness, will need to rule out MS flare.  Other possibility is patient has been ambulating less due to weakness and this leads to paresthesia.  Unlikely to be secondary to stroke but this can also be ruled out with brain MRI. Spoke with Neurology and they recommended C/T spine and brain MRI w/ w/o contrast. They will consult if signs of MS flare. Home meds: Ocrevus for MS DMARD, baclofen for spasticity, phentermine for energy, and dalfampridine for coordination -PT/OT eval and treat -C/T spine and brain MRI with and without contrast. -Officially consult neurology if any signs of acute MS flare or stroke  Abnormal UA Patient's UA did show large hemoglobin without RBC -CK ordered   Best Practice: Diet: Regular diet IVF: none VTE: enoxaparin (LOVENOX) injection 40 mg Start: 01/12/22 2200 Code: Full Code Dispo: Observation with expected length of stay less than 2 midnights. Anticipated Discharge Location: Home Barriers to Discharge: tachycardia and r/o MS flare  Signature: France Ravens, MD Internal Medicine Resident, PGY-1 [facility]  Pager #: [phone] (If no response, contact on-call pager) 6:06 PM, 01/12/2022   Please contact the on-call pager after 5 pm and on weekends at [phone].

## 2022-01-12 NOTE — ED Notes (Signed)
ED TO INPATIENT HANDOFF REPORT  ED Nurse Name and Phone #: Pattricia Boss [phone]  S Name/Age/Gender Lindsay Schneider 36 y.o. female Room/Bed: 029C/029C  Code Status   Code Status: Full Code  Home/SNF/Other Home Patient oriented to: self, place, time, and situation Is this baseline? Yes   Triage Complete: Triage complete  Chief Complaint Acute asthma exacerbation [J45.901]  Triage Note Pt BIB [facility] for eval of SOB x 1 month. Pt was recently Dc'd from here in early may for bronchitis. Pt reports that she has been unable to do nebs at home, and reports ongoing SOB w/ cough. EMS reports wheezing and rhonchi. Duoneb en route w/ 125 solumedrol IV.   Allergies No Known Allergies  Level of Care/Admitting Diagnosis ED Disposition     ED Disposition  Admit   Condition  --   Comment  Hospital Area: [facility] [[id number]]  Level of Care: Telemetry Medical [104]  May place patient in observation at [facility] or [facility] if equivalent level of care is available:: No  Covid Evaluation: Asymptomatic - no recent exposure (last 10 days) testing not required  Diagnosis: Acute asthma exacerbation [627035]  Admitting Physician: Mercie Eon [[id number]]  Attending Physician: Mercie Eon [[id number]]          B Medical/Surgery History Past Medical History:  Diagnosis Date   Bilateral leg weakness 04/27/2015   Left foot drop 02/08/2017   Movement disorder    MS (multiple sclerosis) (HCC)    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   Past Surgical History:  Procedure Laterality Date   CESAREAN SECTION       A IV Location/Drains/Wounds Patient Lines/Drains/Airways Status     Active Line/Drains/Airways     Name Placement date Placement time Site Days   Peripheral IV 01/12/22 20 G Left Wrist 01/12/22  0936  Wrist  less than 1   Peripheral IV 01/12/22 20 G Right Antecubital 01/12/22  1136  Antecubital  less than 1   External Urinary Catheter  11/25/21  --  --  48            Intake/Output Last 24 hours  Intake/Output Summary (Last 24 hours) at 01/12/2022 1604 Last data filed at 01/12/2022 1123 Gross per 24 hour  Intake 1000 ml  Output --  Net 1000 ml    Labs/Imaging Results for orders placed or performed during the hospital encounter of 01/12/22 (from the past 48 hour(s))  Resp Panel by RT-PCR (Flu A&B, Covid) Anterior Nasal Swab     Status: None   Collection Time: 01/12/22  9:49 AM   Specimen: Anterior Nasal Swab  Result Value Ref Range   SARS Coronavirus 2 by RT PCR NEGATIVE NEGATIVE    Comment: (NOTE) SARS-CoV-2 target nucleic acids are NOT DETECTED.  The SARS-CoV-2 RNA is generally detectable in upper respiratory specimens during the acute phase of infection. The lowest concentration of SARS-CoV-2 viral copies this assay can detect is 138 copies/mL. A negative result does not preclude SARS-Cov-2 infection and should not be used as the sole basis for treatment or other patient management decisions. A negative result may occur with  improper specimen collection/handling, submission of specimen other than nasopharyngeal swab, presence of viral mutation(s) within the areas targeted by this assay, and inadequate number of viral copies(<138 copies/mL). A negative result must be combined with clinical observations, patient history, and epidemiological information. The expected result is Negative.  Fact Sheet for Patients:  BloggerCourse.com  Fact Sheet for Healthcare  Providers:  SeriousBroker.it  This test is no t yet approved or cleared by the [place] and  has been authorized for detection and/or diagnosis of SARS-CoV-2 by FDA under an Emergency Use Authorization (EUA). This EUA will remain  in effect (meaning this test can be used) for the duration of the COVID-19 declaration under Section 564(b)(1) of the Act, 21 U.S.C.section 360bbb-3(b)(1),  unless the authorization is terminated  or revoked sooner.       Influenza A by PCR NEGATIVE NEGATIVE   Influenza B by PCR NEGATIVE NEGATIVE    Comment: (NOTE) The Xpert Xpress SARS-CoV-2/FLU/RSV plus assay is intended as an aid in the diagnosis of influenza from Nasopharyngeal swab specimens and should not be used as a sole basis for treatment. Nasal washings and aspirates are unacceptable for Xpert Xpress SARS-CoV-2/FLU/RSV testing.  Fact Sheet for Patients: BloggerCourse.com  Fact Sheet for Healthcare Providers: SeriousBroker.it  This test is not yet approved or cleared by the [place] FDA and has been authorized for detection and/or diagnosis of SARS-CoV-2 by FDA under an Emergency Use Authorization (EUA). This EUA will remain in effect (meaning this test can be used) for the duration of the COVID-19 declaration under Section 564(b)(1) of the Act, 21 U.S.C. section 360bbb-3(b)(1), unless the authorization is terminated or revoked.  Performed at [facility], [address], [place], [place] [address]   I-Stat beta hCG blood, ED     Status: None   Collection Time: 01/12/22 10:11 AM  Result Value Ref Range   I-stat hCG, quantitative <5.0 <5 mIU/mL   Comment 3            Comment:   GEST. AGE      CONC.  (mIU/mL)   <=1 WEEK        5 - 50     2 WEEKS       50 - 500     3 WEEKS       100 - 10,000     4 WEEKS     1,000 - 30,000        FEMALE AND NON-PREGNANT FEMALE:     LESS THAN 5 mIU/mL   Basic metabolic panel     Status: Abnormal   Collection Time: 01/12/22 10:15 AM  Result Value Ref Range   Sodium 136 135 - 145 mmol/L   Potassium 3.5 3.5 - 5.1 mmol/L   Chloride 104 98 - 111 mmol/L   CO2 20 (L) 22 - 32 mmol/L   Glucose, Bld 114 (H) 70 - 99 mg/dL    Comment: Glucose reference range applies only to samples taken after fasting for at least 8 hours.   BUN <5 (L) 6 - 20 mg/dL   Creatinine, Ser 4.17 0.44 - 1.00  mg/dL   Calcium 9.2 8.9 - 40.8 mg/dL   GFR, Estimated >14 >48 mL/min    Comment: (NOTE) Calculated using the CKD-EPI Creatinine Equation (2021)    Anion gap 12 5 - 15    Comment: Performed at [facility], [address], [place], [place] [address]  CBC with Differential     Status: Abnormal   Collection Time: 01/12/22 10:15 AM  Result Value Ref Range   WBC 11.5 (H) 4.0 - 10.5 K/uL   RBC 4.58 3.87 - 5.11 MIL/uL   Hemoglobin 12.3 12.0 - 15.0 g/dL   HCT 14.9 70.2 - 63.7 %   MCV 81.9 80.0 - 100.0 fL   MCH 26.9 26.0 -  34.0 pg   MCHC 32.8 30.0 - 36.0 g/dL   RDW 32.2 02.5 - 42.7 %   Platelets 267 150 - 400 K/uL   nRBC 0.0 0.0 - 0.2 %   Neutrophils Relative % 67 %   Neutro Abs 7.5 1.7 - 7.7 K/uL   Lymphocytes Relative 17 %   Lymphs Abs 2.0 0.7 - 4.0 K/uL   Monocytes Relative 8 %   Monocytes Absolute 0.9 0.1 - 1.0 K/uL   Eosinophils Relative 8 %   Eosinophils Absolute 1.0 (H) 0.0 - 0.5 K/uL   Basophils Relative 0 %   Basophils Absolute 0.1 0.0 - 0.1 K/uL   Immature Granulocytes 0 %   Abs Immature Granulocytes 0.04 0.00 - 0.07 K/uL    Comment: Performed at [facility], [address] [place], [place] [address]  Urinalysis, Routine w reflex microscopic Urine, Clean Catch     Status: Abnormal   Collection Time: 01/12/22 11:14 AM  Result Value Ref Range   Color, Urine YELLOW YELLOW   APPearance CLEAR CLEAR   Specific Gravity, Urine 1.011 1.005 - 1.030   pH 5.0 5.0 - 8.0   Glucose, UA NEGATIVE NEGATIVE mg/dL   Hgb urine dipstick LARGE (A) NEGATIVE   Bilirubin Urine NEGATIVE NEGATIVE   Ketones, ur 80 (A) NEGATIVE mg/dL   Protein, ur NEGATIVE NEGATIVE mg/dL   Nitrite NEGATIVE NEGATIVE   Leukocytes,Ua NEGATIVE NEGATIVE   RBC / HPF 0-5 0 - 5 RBC/hpf   WBC, UA 0-5 0 - 5 WBC/hpf   Bacteria, UA NONE SEEN NONE SEEN    Comment: Performed at [facility], [address] [place], [place] [address]   CT Angio Chest PE W and/or Wo Contrast  Result Date:  01/12/2022 CLINICAL DATA:  Shortness of breath, cough EXAM: CT ANGIOGRAPHY CHEST WITH CONTRAST TECHNIQUE: Multidetector CT imaging of the chest was performed using the standard protocol during bolus administration of intravenous contrast. Multiplanar CT image reconstructions and MIPs were obtained to evaluate the vascular anatomy. RADIATION DOSE REDUCTION: This exam was performed according to the departmental dose-optimization program which includes automated exposure control, adjustment of the mA and/or kV according to patient size and/or use of iterative reconstruction technique. CONTRAST:  OMNIPAQUE IOHEXOL 350 MG/ML SOLN COMPARISON:  Previous studies including the chest radiographs done earlier today FINDINGS: Cardiovascular: There is homogeneous enhancement in thoracic aorta. There are no intraluminal filling defects in the central pulmonary artery branches. Evaluation of small peripheral branches is limited by motion artifacts. Mediastinum/Nodes: No significant lymphadenopathy seen. Lungs/Pleura: There is no focal pulmonary consolidation. There is no pleural effusion or pneumothorax. Upper Abdomen: Unremarkable. Musculoskeletal: Unremarkable. Review of the MIP images confirms the above findings. IMPRESSION: There is no evidence of central pulmonary artery embolism. Evaluation of small peripheral branches is limited by motion artifacts. There is no evidence of thoracic aortic dissection. There is no focal pulmonary consolidation. Electronically Signed   By: Ernie Avena M.D.   On: 01/12/2022 12:32   DG Chest Port 1 View  Result Date: 01/12/2022 CLINICAL DATA:  Shortness of breath for 1 month EXAM: PORTABLE CHEST 1 VIEW COMPARISON:  12/02/2021 FINDINGS: The heart size and mediastinal contours are within normal limits. Both lungs are clear. The visualized skeletal structures are unremarkable. IMPRESSION: No acute abnormality of the lungs in AP portable projection. Electronically Signed   By: Jearld Lesch M.D.   On: 01/12/2022 10:46    Pending Labs Unresulted Labs (From admission, onward)  Start     Ordered   01/12/22 1455  Rapid urine drug screen (hospital performed)  ONCE - STAT,   STAT        01/12/22 1454   01/12/22 1454  TSH  Once,   R        01/12/22 1453            Vitals/Pain Today's Vitals   01/12/22 1300 01/12/22 1345 01/12/22 1430 01/12/22 1515  BP: (!) 146/99 (!) 146/99 (!) 154/97 (!) 152/99  Pulse: (!) 133 (!) 134 (!) 133 (!) 130  Resp: 18 19 (!) 24 16  Temp:      TempSrc:      SpO2: 98% 100% 98% 97%  Weight:      Height:      PainSc:        Isolation Precautions No active isolations  Medications Medications  hydrochlorothiazide (HYDRODIURIL) tablet 25 mg (has no administration in time range)  baclofen (LIORESAL) tablet 10 mg (has no administration in time range)  enoxaparin (LOVENOX) injection 40 mg (has no administration in time range)  acetaminophen (TYLENOL) tablet 650 mg (has no administration in time range)    Or  acetaminophen (TYLENOL) suppository 650 mg (has no administration in time range)  ipratropium (ATROVENT) nebulizer solution 0.5 mg (has no administration in time range)  predniSONE (DELTASONE) tablet 40 mg (has no administration in time range)  ipratropium-albuterol (DUONEB) 0.5-2.5 (3) MG/3ML nebulizer solution 3 mL (3 mLs Nebulization Given 01/12/22 1022)  sodium chloride 0.9 % bolus 1,000 mL (0 mLs Intravenous Stopped 01/12/22 1123)  iohexol (OMNIPAQUE) 350 MG/ML injection 100 mL (100 mLs Intravenous Contrast Given 01/12/22 1226)    Mobility Bedbound 2/2 MS     Focused Assessments Pulmonary Assessment Handoff:  Lung sounds: Bilateral Breath Sounds: Expiratory wheezes, Rhonchi O2 Device: Room Air      R Recommendations: See Admitting Provider Note  Report given to:   Additional Notes: PT is bedbound at baseline 2/2 MS Diagnosis. GCS 15, A&Ox4. Urinates utilizing purewick. Able to communicate needs. Currently on  RA down here, has rec'd 1 breathing treatment w. Improvement of breathing. Plan for MRI, being admitted for persistent tachycardia to the 130s. Asymptomatic of tachycardia.

## 2022-01-12 NOTE — ED Triage Notes (Signed)
Pt BIB [facility] for eval of SOB x 1 month. Pt was recently Dc'd from here in early may for bronchitis. Pt reports that she has been unable to do nebs at home, and reports ongoing SOB w/ cough. EMS reports wheezing and rhonchi. Duoneb en route w/ 125 solumedrol IV.

## 2022-01-12 NOTE — Plan of Care (Signed)

## 2022-01-12 NOTE — ED Provider Notes (Signed)
[facility] EMERGENCY DEPARTMENT Provider Note   CSN: [id number] Arrival date & time: 01/12/22  7846     History  Chief Complaint  Patient presents with   Shortness of Breath    Lindsay Schneider is a 36 y.o. female.  36 year old female with prior medical history as detailed below presents for evaluation.  Patient reports that she was diagnosed with bronchitis in early May.  Patient with reported intermittent use of albuterol at home.  Patient reports increased cough and wheezing for the last week.  Home use of albuterol MDI has been unhelpful.  Symptoms worsened significantly over the last 48 hours and she is coming to the ED today.  Patient was brought in by EMS.  Patient was given 125 mg of Solu-Medrol and 1 DuoNeb treatment en route.  Patient reports that she feels mildly improved after this EMS treatment.  Denies fever.  She denies chest pain.  She denies urinary symptoms, abdominal pain, back pain, or other complaint.  Patient with history of MS.  Patient is wheelchair-bound.  She has significant deficits in her legs and left arm.  The history is provided by the patient and medical records.  Shortness of Breath Severity:  Moderate Onset quality:  Gradual Duration:  5 days Timing:  Constant Progression:  Worsening Chronicity:  New      Home Medications Prior to Admission medications   Medication Sig Start Date End Date Taking? Authorizing Provider  acetaminophen (TYLENOL) 500 MG tablet Take 1,000 mg by mouth every 6 (six) hours as needed for mild pain.    [provider]  albuterol (PROVENTIL) (2.5 MG/3ML) 0.083% nebulizer solution INHALE 3 ML BY NEBULIZATION EVERY 6 HOURS AS NEEDED FOR WHEEZING OR SHORTNESS OF BREATH 12/06/21   Lindsay Rainwater, MD  albuterol (VENTOLIN HFA) 108 (90 Base) MCG/ACT inhaler Inhale 2 puffs into the lungs every 6 (six) hours as needed for wheezing or shortness of breath. 11/26/21   Lindsay Rainwater, MD   baclofen (LIORESAL) 10 MG tablet TAKE 1 TO 2 TABLETS 4 TIMES A DAY Patient taking differently: Take 10 mg by mouth 4 (four) times daily. 08/29/21   Schneider, Lindsay Furl, MD  dalfampridine 10 MG TB12 TAKE 1 TABLET BY MOUTH 2 TIMES A DAY (APPROXIMATELY 12 HOURS APART) Patient taking differently: Take 10 mg by mouth 2 (two) times daily. 08/09/21   Schneider, Amy, NP  guaiFENesin-dextromethorphan (ROBITUSSIN DM) 100-10 MG/5ML syrup Take 5 mLs by mouth every 4 (four) hours as needed for cough. 11/26/21   Lindsay Rainwater, MD  hydrochlorothiazide (HYDRODIURIL) 25 MG tablet TAKE 1 TABLET (25 MG TOTAL) BY MOUTH DAILY. 12/26/21   Schneider, Lindsay Furl, MD  phentermine 37.5 MG capsule TAKE 1 CAPSULE BY MOUTH EVERY DAY IN THE MORNING 12/26/21   Schneider, Lindsay Furl, MD  Vitamin D, Ergocalciferol, (DRISDOL) 1.25 MG (50000 UNIT) CAPS capsule TAKE 1 CAPSULE (50,000 UNITS TOTAL) BY MOUTH EVERY 7 (SEVEN) DAYS Patient taking differently: Take 50,000 Units by mouth every Monday. 10/27/21   Schneider, Lindsay Furl, MD      Allergies    Patient has no known allergies.    Review of Systems   Review of Systems  Respiratory:  Positive for shortness of breath.   All other systems reviewed and are negative.   Physical Exam Updated Vital Signs BP (!) 157/109 (BP Location: Left Arm)   Pulse (!) 130   Temp 98.4 F (36.9 C) (Oral)   Resp (!) 22   Ht  5\' 2"  (1.575 m)   Wt 76 kg   SpO2 100%   BMI 30.65 kg/m  Physical Exam Vitals and nursing note reviewed.  Constitutional:      General: She is not in acute distress.    Appearance: Normal appearance. She is well-developed.  HENT:     Head: Normocephalic and atraumatic.  Eyes:     Conjunctiva/sclera: Conjunctivae normal.     Pupils: Pupils are equal, round, and reactive to light.  Cardiovascular:     Rate and Rhythm: Regular rhythm. Tachycardia present.     Heart sounds: Normal heart sounds.  Pulmonary:     Effort: Tachypnea present. No respiratory distress.     Comments: Diffuse  expiratory wheezes in all lung fields Abdominal:     General: There is no distension.     Palpations: Abdomen is soft.     Tenderness: There is no abdominal tenderness.  Musculoskeletal:        General: No deformity. Normal range of motion.     Cervical back: Normal range of motion and neck supple.  Skin:    General: Skin is warm and dry.  Neurological:     General: No focal deficit present.     Mental Status: She is alert and oriented to person, place, and time.     ED Results / Procedures / Treatments   Labs (all labs ordered are listed, but only abnormal results are displayed) Labs Reviewed  RESP PANEL BY RT-PCR (FLU A&B, COVID) [id number]  BASIC METABOLIC PANEL  CBC WITH DIFFERENTIAL/PLATELET  URINALYSIS, ROUTINE W REFLEX MICROSCOPIC  I-STAT BETA HCG BLOOD, ED ([facility], [facility], [facility] ONLY)    EKG None  Radiology No results found.  Procedures Procedures    Medications Ordered in ED Medications  ipratropium-albuterol (DUONEB) 0.5-2.5 (3) MG/3ML nebulizer solution 3 mL (has no administration in time range)    ED Course/ Medical Decision Making/ A&P                           Medical Decision Making Amount and/or Complexity of Data Reviewed Labs: ordered. Radiology: ordered.  Risk Prescription drug management.    Medical Screen Complete  This patient presented to the ED with complaint of wheezing/sob.  This complaint involves an extensive number of treatment options. The initial differential diagnosis includes, but is not limited to, asthma exacerbation, pneumonia, PE, metabolic abnormality, etc.  This presentation is: Acute, Self-Limited, Previously Undiagnosed, Uncertain Prognosis, Complicated, Systemic Symptoms, and Threat to Life/Bodily Function  Patient is presenting with complaint of increasing wheezing and shortness of breath.  Home albuterol MDI use is been an adequate to control her symptoms.  Patient denies fever or other infectious symptoms.  Patient is  noted to be significantly tachycardic and tachypneic with diffuse expiratory wheezes on exam  With nebulizer treatments the patient is improved.  Patient is not hypoxic in the ED.  Patient's work-up is without evidence of acute infection or acute PE.  Patient is noted to be persistently tachycardic throughout the ED course.  Tachycardia appears to have somewhat of a chronic component given prior documentation of tachycardia into the 1 teens 120s.  However, patient's tachycardia today is into the 130s 140s.  Patient's pulmonary exam is improving with ED treatment.  Patient would likely benefit from admission for further work-up, treatment, and observation.  Co morbidities that complicated the patient's evaluation  MS   Additional history obtained:  External records from outside sources obtained and  reviewed including prior ED visits and prior Inpatient records.    Lab Tests:  I ordered and personally interpreted labs.  The pertinent results include: CBC, BMP, UA   Imaging Studies ordered:  I ordered imaging studies including chest x-ray, CT chest I independently visualized and interpreted obtained imaging which showed no acute disease I agree with the radiologist interpretation.   Cardiac Monitoring:  The patient was maintained on a cardiac monitor.  I personally viewed and interpreted the cardiac monitor which showed an underlying rhythm of: Sinus tachycardia   Medicines ordered:  I ordered medication including DuoNeb treatments for bronchospasm Reevaluation of the patient after these medicines showed that the patient: improved   Problem List / ED Course:  Bronchospasm, tachycardia   Reevaluation:  After the interventions noted above, I reevaluated the patient and found that they have: improved Disposition:  After consideration of the diagnostic results and the patients response to treatment, I feel that the patent would benefit from admission.           Final Clinical Impression(s) / ED Diagnoses Final diagnoses:  Bronchospasm  Tachycardia    Rx / DC Orders ED Discharge Orders     None         Wynetta Fines, MD 01/12/22 1352

## 2022-01-13 ENCOUNTER — Observation Stay (HOSPITAL_COMMUNITY): Payer: Medicare Other

## 2022-01-13 ENCOUNTER — Other Ambulatory Visit (HOSPITAL_COMMUNITY): Payer: Self-pay

## 2022-01-13 DIAGNOSIS — G35 Multiple sclerosis: Secondary | ICD-10-CM | POA: Diagnosis present

## 2022-01-13 DIAGNOSIS — Z79899 Other long term (current) drug therapy: Secondary | ICD-10-CM | POA: Diagnosis not present

## 2022-01-13 DIAGNOSIS — R29898 Other symptoms and signs involving the musculoskeletal system: Secondary | ICD-10-CM

## 2022-01-13 DIAGNOSIS — R000 Tachycardia, unspecified: Secondary | ICD-10-CM

## 2022-01-13 DIAGNOSIS — J9801 Acute bronchospasm: Secondary | ICD-10-CM | POA: Diagnosis present

## 2022-01-13 DIAGNOSIS — J4541 Moderate persistent asthma with (acute) exacerbation: Secondary | ICD-10-CM

## 2022-01-13 DIAGNOSIS — Z20822 Contact with and (suspected) exposure to covid-19: Secondary | ICD-10-CM | POA: Diagnosis present

## 2022-01-13 DIAGNOSIS — R9431 Abnormal electrocardiogram [ECG] [EKG]: Secondary | ICD-10-CM

## 2022-01-13 DIAGNOSIS — G8114 Spastic hemiplegia affecting left nondominant side: Secondary | ICD-10-CM | POA: Diagnosis present

## 2022-01-13 DIAGNOSIS — Z6830 Body mass index (BMI) 30.0-30.9, adult: Secondary | ICD-10-CM | POA: Diagnosis not present

## 2022-01-13 DIAGNOSIS — Z8249 Family history of ischemic heart disease and other diseases of the circulatory system: Secondary | ICD-10-CM | POA: Diagnosis not present

## 2022-01-13 DIAGNOSIS — I10 Essential (primary) hypertension: Secondary | ICD-10-CM | POA: Diagnosis present

## 2022-01-13 DIAGNOSIS — Z825 Family history of asthma and other chronic lower respiratory diseases: Secondary | ICD-10-CM | POA: Diagnosis not present

## 2022-01-13 DIAGNOSIS — J45901 Unspecified asthma with (acute) exacerbation: Secondary | ICD-10-CM | POA: Diagnosis present

## 2022-01-13 DIAGNOSIS — M21372 Foot drop, left foot: Secondary | ICD-10-CM | POA: Diagnosis present

## 2022-01-13 DIAGNOSIS — Z993 Dependence on wheelchair: Secondary | ICD-10-CM | POA: Diagnosis not present

## 2022-01-13 DIAGNOSIS — E669 Obesity, unspecified: Secondary | ICD-10-CM | POA: Diagnosis present

## 2022-01-13 LAB — CBC
HCT: 38.6 % (ref 36.0–46.0)
Hemoglobin: 13.1 g/dL (ref 12.0–15.0)
MCH: 26.8 pg (ref 26.0–34.0)
MCHC: 33.9 g/dL (ref 30.0–36.0)
MCV: 79.1 fL — ABNORMAL LOW (ref 80.0–100.0)
Platelets: 347 10*3/uL (ref 150–400)
RBC: 4.88 MIL/uL (ref 3.87–5.11)
RDW: 14.3 % (ref 11.5–15.5)
WBC: 14.3 10*3/uL — ABNORMAL HIGH (ref 4.0–10.5)
nRBC: 0 % (ref 0.0–0.2)

## 2022-01-13 LAB — COMPREHENSIVE METABOLIC PANEL
ALT: 23 U/L (ref 0–44)
AST: 20 U/L (ref 15–41)
Albumin: 3.8 g/dL (ref 3.5–5.0)
Alkaline Phosphatase: 68 U/L (ref 38–126)
Anion gap: 14 (ref 5–15)
BUN: 5 mg/dL — ABNORMAL LOW (ref 6–20)
CO2: 21 mmol/L — ABNORMAL LOW (ref 22–32)
Calcium: 9.5 mg/dL (ref 8.9–10.3)
Chloride: 99 mmol/L (ref 98–111)
Creatinine, Ser: 0.65 mg/dL (ref 0.44–1.00)
GFR, Estimated: >60 mL/min (ref >60)
Glucose, Bld: 112 mg/dL — ABNORMAL HIGH (ref 70–99)
Potassium: 3.6 mmol/L (ref 3.5–5.1)
Sodium: 134 mmol/L — ABNORMAL LOW (ref 135–145)
Total Bilirubin: 0.8 mg/dL (ref 0.3–1.2)
Total Protein: 7.1 g/dL (ref 6.5–8.1)

## 2022-01-13 LAB — ECHOCARDIOGRAM COMPLETE
AR max vel: 2.64 cm2
AV Area VTI: 2.69 cm2
AV Area mean vel: 2.84 cm2
AV Mean grad: 5 mmHg
AV Peak grad: 8.8 mmHg
Ao pk vel: 1.48 m/s
Height: 62 in
Weight: 2680.79 oz

## 2022-01-13 LAB — GLUCOSE, CAPILLARY: Glucose-Capillary: 118 mg/dL — ABNORMAL HIGH (ref 70–99)

## 2022-01-13 LAB — CK: Total CK: 110 U/L (ref 38–234)

## 2022-01-13 LAB — TSH: TSH: 0.557 u[IU]/mL (ref 0.350–4.500)

## 2022-01-13 MED ORDER — IPRATROPIUM BROMIDE 0.02 % IN SOLN
0.5000 mg | Freq: Three times a day (TID) | RESPIRATORY_TRACT | Status: DC
  Administered 2022-01-13 – 2022-01-16 (×10): 0.5 mg via RESPIRATORY_TRACT
  Filled 2022-01-13 (×11): qty 2.5

## 2022-01-13 MED ORDER — GADOBUTROL 1 MMOL/ML IV SOLN
7.5000 mL | Freq: Once | INTRAVENOUS | Status: AC | PRN
  Administered 2022-01-13: 7.5 mL via INTRAVENOUS

## 2022-01-13 MED ORDER — FLUTICASONE-SALMETEROL 250-50 MCG/ACT IN AEPB
1.0000 | INHALATION_SPRAY | Freq: Two times a day (BID) | RESPIRATORY_TRACT | 0 refills | Status: AC
  Filled 2022-01-13: qty 60, 30d supply, fill #0

## 2022-01-13 MED ORDER — LEVALBUTEROL HCL 0.63 MG/3ML IN NEBU: 0.6300 mg | INHALATION_SOLUTION | RESPIRATORY_TRACT | Status: DC | PRN

## 2022-01-13 MED ORDER — DILTIAZEM HCL 60 MG PO TABS
60.0000 mg | ORAL_TABLET | Freq: Two times a day (BID) | ORAL | Status: AC
  Administered 2022-01-13 (×2): 60 mg via ORAL
  Filled 2022-01-13 (×2): qty 1

## 2022-01-13 MED ORDER — DALFAMPRIDINE ER 10 MG PO TB12
10.0000 mg | ORAL_TABLET | Freq: Two times a day (BID) | ORAL | Status: DC
  Administered 2022-01-13 – 2022-01-16 (×7): 10 mg via ORAL
  Filled 2022-01-13 (×7): qty 1

## 2022-01-13 NOTE — Progress Notes (Signed)
Pt requesting follow up on Medicare Observation Status Form - pt stating she has questions and did not understand the form.  Message sent to case management.  Will follow up with case management tomorrow.

## 2022-01-13 NOTE — Progress Notes (Signed)
Notified by CCMD that pt in SVT.  EKG obtained.  MD notified.  MD at bedside - no new orders at this time.  Will continue to monitor.

## 2022-01-13 NOTE — Progress Notes (Signed)
Pt requesting to restart her dalfampridine throughout the night. Messaged Dr. Ned Card about this and she will let day rounding team address the home medications. Explained this to pt and she was agreement with the plan. Then this morning she states "I'm upset about not being on my home medication". Her family called the charge nurse to request the medication also. Message Dr. Ned Card again about pt request to restart medication. Order placed to restart dalfampridine and the rest of home meds to be addressed by day shift rounding team.

## 2022-01-13 NOTE — Progress Notes (Signed)
  Echocardiogram 2D Echocardiogram has been performed.  Gerda Diss 01/13/2022, 3:07 PM

## 2022-01-14 ENCOUNTER — Other Ambulatory Visit (HOSPITAL_COMMUNITY): Payer: Self-pay

## 2022-01-14 LAB — GLUCOSE, CAPILLARY: Glucose-Capillary: 108 mg/dL — ABNORMAL HIGH (ref 70–99)

## 2022-01-14 MED ORDER — BENZONATATE 100 MG PO CAPS
100.0000 mg | ORAL_CAPSULE | Freq: Three times a day (TID) | ORAL | Status: DC | PRN
  Administered 2022-01-14 – 2022-01-16 (×4): 100 mg via ORAL
  Filled 2022-01-14 (×4): qty 1

## 2022-01-14 MED ORDER — GUAIFENESIN ER 600 MG PO TB12
600.0000 mg | ORAL_TABLET | Freq: Two times a day (BID) | ORAL | Status: DC | PRN
Start: 2022-01-14 — End: 2022-01-17
  Administered 2022-01-14 – 2022-01-16 (×3): 600 mg via ORAL
  Filled 2022-01-14 (×3): qty 1

## 2022-01-14 MED ORDER — MOMETASONE FURO-FORMOTEROL FUM 200-5 MCG/ACT IN AERO
2.0000 | INHALATION_SPRAY | Freq: Two times a day (BID) | RESPIRATORY_TRACT | Status: DC
  Administered 2022-01-15 – 2022-01-16 (×3): 2 via RESPIRATORY_TRACT
  Filled 2022-01-14: qty 8.8

## 2022-01-14 NOTE — Progress Notes (Signed)
Tele called to report SVT in 160s for a few seconds then down to 140s. Internal medicine MD on floor making rounds. Made MD aware, MD went to bedside to assess pt around 0955. No new orders as of now.

## 2022-01-15 DIAGNOSIS — J45901 Unspecified asthma with (acute) exacerbation: Principal | ICD-10-CM

## 2022-01-15 DIAGNOSIS — I10 Essential (primary) hypertension: Secondary | ICD-10-CM

## 2022-01-15 LAB — GLUCOSE, CAPILLARY: Glucose-Capillary: 117 mg/dL — ABNORMAL HIGH (ref 70–99)

## 2022-01-15 MED ORDER — BENZONATATE 100 MG PO CAPS: 100.0000 mg | ORAL_CAPSULE | Freq: Three times a day (TID) | ORAL | 0 refills | Status: AC | PRN

## 2022-01-15 MED ORDER — PREDNISONE 20 MG PO TABS: 40.0000 mg | ORAL_TABLET | Freq: Every day | ORAL | 0 refills | Status: DC

## 2022-01-15 MED ORDER — FEXOFENADINE HCL 60 MG PO TABS: 60.0000 mg | ORAL_TABLET | Freq: Two times a day (BID) | ORAL | 0 refills | Status: AC

## 2022-01-15 NOTE — Discharge Summary (Signed)
Name: Lindsay Schneider MRN: [id number] DOB: [birth year] 36 y.o. PCP: Patient, No Pcp Per  Date of Admission: 01/12/2022  9:39 AM Date of Discharge: 01/15/2022 Attending Physician: Miguel Aschoff, MD  Discharge Diagnosis: 1.  Asthma exacerbation 2.  Sinus tachycardia 3.  Multiple sclerosis 4.  Hypertension  Discharge Medications: Allergies as of 01/15/2022   No Known Allergies      Medication List     STOP taking these medications    phentermine 37.5 MG capsule       TAKE these medications    acetaminophen 500 MG tablet Commonly known as: TYLENOL Take 1,000 mg by mouth every 6 (six) hours as needed for mild pain.   Advair Diskus 250-50 MCG/ACT Aepb Generic drug: fluticasone-salmeterol Inhale 1 puffs into the lungs in the morning and at bedtime.   albuterol 108 (90 Base) MCG/ACT inhaler Commonly known as: VENTOLIN HFA Inhale 2 puffs into the lungs every 6 (six) hours as needed for wheezing or shortness of breath.   albuterol (2.5 MG/3ML) 0.083% nebulizer solution Commonly known as: PROVENTIL INHALE 3 ML BY NEBULIZATION EVERY 6 HOURS AS NEEDED FOR WHEEZING OR SHORTNESS OF BREATH   baclofen 10 MG tablet Commonly known as: LIORESAL TAKE 1 TO 2 TABLETS 4 TIMES A DAY What changed: See the new instructions.   benzonatate 100 MG capsule Commonly known as: TESSALON Take 1 capsule (100 mg total) by mouth 3 (three) times daily as needed for up to 14 days for cough. What changed:  when to take this reasons to take this   dalfampridine 10 MG Tb12 TAKE 1 TABLET BY MOUTH 2 TIMES A DAY (APPROXIMATELY 12 HOURS APART) What changed: See the new instructions.   fexofenadine 60 MG tablet Commonly known as: Allegra Allergy Take 1 tablet (60 mg total) by mouth 2 (two) times daily.   guaiFENesin-dextromethorphan 100-10 MG/5ML syrup Commonly known as: ROBITUSSIN DM Take 5 mLs by mouth every 4 (four) hours as needed for cough.   hydrochlorothiazide 25 MG  tablet Commonly known as: HYDRODIURIL TAKE 1 TABLET (25 MG TOTAL) BY MOUTH DAILY.   predniSONE 20 MG tablet Commonly known as: DELTASONE Take 2 tablets (40 mg total) by mouth daily with breakfast for 1 day. Start taking on: January 16, 2022   Vitamin D (Ergocalciferol) 1.25 MG (50000 UNIT) Caps capsule Commonly known as: DRISDOL TAKE 1 CAPSULE (50,000 UNITS TOTAL) BY MOUTH EVERY 7 (SEVEN) DAYS What changed: when to take this        Disposition and follow-up:   Lindsay Schneider was discharged from [facility] in Stable condition.  At the hospital follow up visit please address:  1.    Acute asthma exacerbation This is a second admission in the last few months for similar symptoms.  No prior diagnosis of asthma. -Advair was started after discharge -Albuterol for rescue inhaler -We prescribed Allegra and Tessalon PRN after discharge -She will need to follow-up with pulmonology for PFT -Asthma education provided during this admission  Sinus tachycardia Suspect secondary to phentermine and excessive albuterol used.  -Continue holding phentermine until neurology follow-up -Monitor outpatient -May need a Holter monitor  Multiple sclerosis She reports new right lower leg weakness but brain, C-spine, T-spine and L-spine MRI did not show any evidence of MS flare. -Continue home health PT/OT -Follow-up with neurology  2.  Labs / imaging needed at time of follow-up: BMP  3.  Pending labs/ test needing follow-up: NA  Follow-up Appointments:  -PCP on  1-2 weeks -F/u with Neurology in July as scheduled  Hospital Course by problem list:  Acute asthma exacerbation Lindsay Schneider is a 36 year old woman with multiple sclerosis (limited mobility and using wheelchair at baseline), hypertension who presented to the ED via ambulance for progressive dyspnea and wheezing.  Her symptoms reportedly has been going on for the past week.  She attempted cough syrup with  albuterol inhaler with limited benefit.  She did use albuterol multiple times a day with 3 puffs each time.  She denies rhinorrhea or sputum production.  Denies any sick contact.  On route to the ED, patient received 125 mg IV Solu-Medrol and DuoNebs with improvement of her breathing.   She was admitted to [facility] in May 2023 for similar symptoms and was thought to have asthma exacerbation.  Her symptoms almost resolved on the day of admission.  She was started on prednisone for 4 more days to complete a 5-day course of steroids.  Albuterol was avoided due to tachycardia.  Given this is a second time she has an exacerbation and the severity of her symptoms, we started her on ICS/LABA Advair on discharge. She will need to follow-up with pulmonology outpatient for PFT.  We also provided asthma education during this admission.  Sinus tachycardia Her heart rate on admissions was 120s to 130s with mildly elevated blood pressure of 150 systolic.  She was also found to have sinus tachycardia during the last admission in May.  CTA was performed and ruled out PE or pulmonary consolidation.  TSH within normal limits.  We had to give her 2 doses of diltiazem 60 mg to be able to obtain an echocardiogram, which ruled out structural heart disease.  Her heart rate improved to the low 100s and high 90s over the next few days.  Suspect this is secondary to excessive albuterol use and phentermine.  Patient is advised to continue holding phentermine until seen her neurologist.  She will be sent home with ipratropium as needed to avoid worsening of her tachycardia.  Multiple sclerosis She was diagnosed in 2011 and has very limited mobility.  She is normally wheelchair-bound due to persistent left upper extremity contracture and left lower extremity weakness.  She is follow-up with [name] and received her DMR every 6 months.  Patient reports new right lower leg weakness that started a few weeks ago with associated  numbness/tingling sensation.  We discussed with neurology who recommended obtaining imaging to rule out MS flare.  Brain, C-spine, T-spine and L-spine MRI was negative for any acute change.  Suspect her right lower leg weakness is due to physical deconditioning.  She will be discharged with home health PT/OT.  HPI Patient is seen at bedside with her cousin.  She appears comfortable in no acute distress.  She did have 2 coughing episodes but recovered fairly quickly.  She endorses persistent symptoms of rhinorrhea, lacrimation and sneezing.  Unsure of triggering factors.  [place] [employer] that she has year-round.  States that her brother and children also have asthma.  We provided reassurance and asthma education prior to discharge.  Discharge Exam:   BP (!) 126/98   Pulse (!) 109   Temp 98.3 F (36.8 C) (Oral)   Resp 17   Ht  (1.575 m)   Wt 76.6 kg   SpO2 94%   BMI 30.89 kg/m  Discharge exam:  Physical Exam Constitutional:      General: She is not in acute distress.    Appearance: She is not  ill-appearing.  HENT:     Head: Normocephalic.  Eyes:     General:        Right eye: No discharge.        Left eye: No discharge.     Conjunctiva/sclera: Conjunctivae normal.  Cardiovascular:     Rate and Rhythm: Regular rhythm. Tachycardia present.  Pulmonary:     Effort: Pulmonary effort is normal. No respiratory distress.     Breath sounds: Normal breath sounds. No wheezing.  Musculoskeletal:     Cervical back: Normal range of motion.  Skin:    General: Skin is warm.  Neurological:     Mental Status: She is alert and oriented to person, place, and time.  Psychiatric:        Mood and Affect: Mood normal.        Behavior: Behavior normal.      Pertinent Labs, Studies, and Procedures:     Latest Ref Rng & Units 01/13/2022    1:48 AM 01/12/2022   10:15 AM 12/02/2021   10:10 AM  CBC  WBC 4.0 - 10.5 K/uL 14.3  11.5  12.7   Hemoglobin 12.0 - 15.0 g/dL 27.0  62.3  76.2   Hematocrit 36.0 -  46.0 % 38.6  37.5  36.7   Platelets 150 - 400 K/uL 347  267  243       Latest Ref Rng & Units 01/13/2022    1:48 AM 01/12/2022   10:15 AM 12/02/2021   10:10 AM  CMP  Glucose 70 - 99 mg/dL 831  517  616   BUN 6 - 20 mg/dL 5  <5  <5   Creatinine 0.44 - 1.00 mg/dL 0.73  7.10  6.26   Sodium 135 - 145 mmol/L 134  136  139   Potassium 3.5 - 5.1 mmol/L 3.6  3.5  3.5   Chloride 98 - 111 mmol/L 99  104  104   CO2 22 - 32 mmol/L 21  20  27    Calcium 8.9 - 10.3 mg/dL 9.5  9.2  8.9   Total Protein 6.5 - 8.1 g/dL 7.1     Total Bilirubin 0.3 - 1.2 mg/dL 0.8     Alkaline Phos 38 - 126 U/L 68     AST 15 - 41 U/L 20     ALT 0 - 44 U/L 23      ECHOCARDIOGRAM COMPLETE  Result Date: 01/13/2022    ECHOCARDIOGRAM REPORT   Patient Name:   MELIS TROCHEZ Date of Exam: 01/13/2022 Medical Rec #:  01/15/2022         Height:       62.0 in Accession #:    [id number]        Weight:       167.5 lb Date of Birth:  08/23/1985          BSA:          1.773 m Patient Age:    35 years          BP:           133/88 mmHg Patient Gender: F                 HR:           110 bpm. Exam Location:  Inpatient Procedure: 2D Echo, Cardiac Doppler and Color Doppler Indications:    Abnormal ECG  History:        Patient has no prior history of Echocardiogram  examinations.                 Signs/Symptoms:Shortness of Breath. Multiple sclerosis.  Sonographer:    Ross Ludwig RDCS (AE) Referring Phys: Doran Stabler  Sonographer Comments: Technically difficult study due to poor echo windows, suboptimal parasternal window and suboptimal subcostal window. IMPRESSIONS  1. Left ventricular ejection fraction, by estimation, is 60 to 65%. The left ventricle has normal function. The left ventricle has no regional wall motion abnormalities. Left ventricular diastolic function could not be evaluated.  2. Right ventricular systolic function is normal. The right ventricular size is normal.  3. The mitral valve is normal in structure. Trivial mitral valve  regurgitation.  4. The aortic valve was not well visualized. Aortic valve regurgitation is not visualized. FINDINGS  Left Ventricle: Left ventricular ejection fraction, by estimation, is 60 to 65%. The left ventricle has normal function. The left ventricle has no regional wall motion abnormalities. The left ventricular internal cavity size was normal in size. Suboptimal image quality limits for assessment of left ventricular hypertrophy. Left ventricular diastolic function could not be evaluated. Right Ventricle: The right ventricular size is normal. No increase in right ventricular wall thickness. Right ventricular systolic function is normal. Left Atrium: Left atrial size was not well visualized. Right Atrium: Right atrial size was not well visualized. Pericardium: There is no evidence of pericardial effusion. Mitral Valve: The mitral valve is normal in structure. Trivial mitral valve regurgitation. Tricuspid Valve: The tricuspid valve is not well visualized. Tricuspid valve regurgitation is not demonstrated. Aortic Valve: The aortic valve was not well visualized. Aortic valve regurgitation is not visualized. Aortic valve mean gradient measures 5.0 mmHg. Aortic valve peak gradient measures 8.8 mmHg. Aortic valve area, by VTI measures 2.69 cm. Pulmonic Valve: The pulmonic valve was not well visualized. Pulmonic valve regurgitation is not visualized. Aorta: The aortic root and ascending aorta are structurally normal, with no evidence of dilitation. IAS/Shunts: The interatrial septum was not well visualized.  LEFT VENTRICLE PLAX 2D LVOT diam:     2.10 cm LV SV:         59 LV SV Index:   33 LVOT Area:     3.46 cm  IVC IVC diam: 1.10 cm LEFT ATRIUM           Index        RIGHT ATRIUM           Index LA Vol (A2C): 27.2 ml 15.34 ml/m  RA Area:     10.30 cm LA Vol (A4C): 22.3 ml 12.58 ml/m  RA Volume:   18.80 ml  10.60 ml/m  AORTIC VALVE AV Area (Vmax):    2.64 cm AV Area (Vmean):   2.84 cm AV Area (VTI):     2.69  cm AV Vmax:           148.00 cm/s AV Vmean:          101.000 cm/s AV VTI:            0.219 m AV Peak Grad:      8.8 mmHg AV Mean Grad:      5.0 mmHg LVOT Vmax:         113.00 cm/s LVOT Vmean:        82.700 cm/s LVOT VTI:          0.170 m LVOT/AV VTI ratio: 0.78  AORTA Ao Root diam: 3.10 cm  SHUNTS Systemic VTI:  0.17 m Systemic Diam: 2.10 cm Kristeen Miss MD  Electronically signed by Kristeen Miss MD Signature Date/Time: 01/13/2022/3:45:55 PM    Final    MR Lumbar Spine W Wo Contrast  Result Date: 01/13/2022 CLINICAL DATA:  Multiple sclerosis. EXAM: MRI LUMBAR SPINE WITHOUT AND WITH CONTRAST TECHNIQUE: Multiplanar and multiecho pulse sequences of the lumbar spine were obtained without and with intravenous contrast. CONTRAST:  7.42mL GADAVIST GADOBUTROL 1 MMOL/ML IV SOLN COMPARISON:  Radiographs September 19, 2007 FINDINGS: Segmentation:  Standard. Alignment:  Mild levoconvex curvature. Vertebrae: No fracture, evidence of discitis, or aggressive bone lesion. Degenerative changes of the left sacroiliac joint. Conus medullaris and cauda equina: Conus extends to the L1 level. Conus and cauda equina appear normal. Paraspinal and other soft tissues: Negative. Disc levels: No significant disc bulge or herniation, spinal canal or neural foraminal stenosis at any level. IMPRESSION: 1. Unremarkable MRI of the lumbar spine. 2. Findings suggestive of left sacroiliac disease. Electronically Signed   By: Baldemar Lenis M.D.   On: 01/13/2022 10:38   MR BRAIN W WO CONTRAST  Result Date: 01/12/2022 CLINICAL DATA:  Multiple sclerosis EXAM: MRI HEAD WITHOUT AND WITH CONTRAST MRI CERVICAL SPINE WITHOUT AND WITH CONTRAST MRI CERVICAL THORACIC WITHOUT AND CONTRAST CONTRAST:  31mL GADAVIST GADOBUTROL 1 MMOL/ML IV SOLN TECHNIQUE: Multiplanar, multiecho pulse sequences of the brain and surrounding structures, and cervical and thoracic spine were obtained without and with intravenous contrast. COMPARISON:  04/20/2021  FINDINGS: MRI HEAD FINDINGS Brain: No acute infarct, mass effect or extra-axial collection. No acute or chronic hemorrhage. Confluent bilateral periventricular and pericallosal white matter lesions are unchanged. Lesions within both cerebellar peduncles are also unchanged. The midline structures are normal. There is no abnormal contrast enhancement. Vascular: Major flow voids are preserved. Skull and upper cervical spine: Normal calvarium and skull base. Visualized upper cervical spine and soft tissues are normal. Sinuses/Orbits:No paranasal sinus fluid levels or advanced mucosal thickening. No mastoid or middle ear effusion. Normal orbits. MRI CERVICAL SPINE FINDINGS Alignment: Physiologic. Vertebrae: No fracture, evidence of discitis, or bone lesion. Cord: Chronic demyelinating lesions within the upper cervical spinal cord at the C2 and C3 levels are unchanged. There are no enhancing lesions. Posterior Fossa, vertebral arteries, paraspinal tissues: Negative Disc levels: C1-2: Unremarkable. C2-3: Normal disc space and facet joints. There is no spinal canal stenosis. No neural foraminal stenosis. C3-4: Normal disc space and facet joints. There is no spinal canal stenosis. No neural foraminal stenosis. C4-5: Unchanged small disc bulge with bilateral uncovertebral osteophytes. There is no spinal canal stenosis. Unchanged mild bilateral neural foraminal stenosis. C5-6: Normal disc space and facet joints. There is no spinal canal stenosis. No neural foraminal stenosis. C6-7: Normal disc space and facet joints. There is no spinal canal stenosis. No neural foraminal stenosis. C7-T1: Normal disc space and facet joints. There is no spinal canal stenosis. No neural foraminal stenosis. MRI THORACIC SPINE FINDINGS Alignment:  Physiologic. Vertebrae: No fracture, evidence of discitis, or bone lesion. Cord:  Normal signal and morphology. Paraspinal and other soft tissues: Negative. Disc levels: No spinal canal stenosis.  IMPRESSION: 1. Unchanged distribution of white matter lesions consistent with multiple sclerosis. No evidence of active demyelination. 2. Unchanged appearance of chronic demyelinating lesions of the upper cervical spinal cord. 3. No evidence of demyelinating disease within the thoracic spinal cord. 4. Unchanged mild C4-5 degenerative disc disease with mild bilateral neural foraminal stenosis. Electronically Signed   By: Deatra Robinson M.D.   On: 01/12/2022 20:24   MR THORACIC SPINE W WO CONTRAST  Result Date: 01/12/2022  CLINICAL DATA:  Multiple sclerosis EXAM: MRI HEAD WITHOUT AND WITH CONTRAST MRI CERVICAL SPINE WITHOUT AND WITH CONTRAST MRI CERVICAL THORACIC WITHOUT AND CONTRAST CONTRAST:  8mL GADAVIST GADOBUTROL 1 MMOL/ML IV SOLN TECHNIQUE: Multiplanar, multiecho pulse sequences of the brain and surrounding structures, and cervical and thoracic spine were obtained without and with intravenous contrast. COMPARISON:  04/20/2021 FINDINGS: MRI HEAD FINDINGS Brain: No acute infarct, mass effect or extra-axial collection. No acute or chronic hemorrhage. Confluent bilateral periventricular and pericallosal white matter lesions are unchanged. Lesions within both cerebellar peduncles are also unchanged. The midline structures are normal. There is no abnormal contrast enhancement. Vascular: Major flow voids are preserved. Skull and upper cervical spine: Normal calvarium and skull base. Visualized upper cervical spine and soft tissues are normal. Sinuses/Orbits:No paranasal sinus fluid levels or advanced mucosal thickening. No mastoid or middle ear effusion. Normal orbits. MRI CERVICAL SPINE FINDINGS Alignment: Physiologic. Vertebrae: No fracture, evidence of discitis, or bone lesion. Cord: Chronic demyelinating lesions within the upper cervical spinal cord at the C2 and C3 levels are unchanged. There are no enhancing lesions. Posterior Fossa, vertebral arteries, paraspinal tissues: Negative Disc levels: C1-2:  Unremarkable. C2-3: Normal disc space and facet joints. There is no spinal canal stenosis. No neural foraminal stenosis. C3-4: Normal disc space and facet joints. There is no spinal canal stenosis. No neural foraminal stenosis. C4-5: Unchanged small disc bulge with bilateral uncovertebral osteophytes. There is no spinal canal stenosis. Unchanged mild bilateral neural foraminal stenosis. C5-6: Normal disc space and facet joints. There is no spinal canal stenosis. No neural foraminal stenosis. C6-7: Normal disc space and facet joints. There is no spinal canal stenosis. No neural foraminal stenosis. C7-T1: Normal disc space and facet joints. There is no spinal canal stenosis. No neural foraminal stenosis. MRI THORACIC SPINE FINDINGS Alignment:  Physiologic. Vertebrae: No fracture, evidence of discitis, or bone lesion. Cord:  Normal signal and morphology. Paraspinal and other soft tissues: Negative. Disc levels: No spinal canal stenosis. IMPRESSION: 1. Unchanged distribution of white matter lesions consistent with multiple sclerosis. No evidence of active demyelination. 2. Unchanged appearance of chronic demyelinating lesions of the upper cervical spinal cord. 3. No evidence of demyelinating disease within the thoracic spinal cord. 4. Unchanged mild C4-5 degenerative disc disease with mild bilateral neural foraminal stenosis. Electronically Signed   By: Deatra Robinson M.D.   On: 01/12/2022 20:24   MR CERVICAL SPINE W WO CONTRAST  Result Date: 01/12/2022 CLINICAL DATA:  Multiple sclerosis EXAM: MRI HEAD WITHOUT AND WITH CONTRAST MRI CERVICAL SPINE WITHOUT AND WITH CONTRAST MRI CERVICAL THORACIC WITHOUT AND CONTRAST CONTRAST:  8mL GADAVIST GADOBUTROL 1 MMOL/ML IV SOLN TECHNIQUE: Multiplanar, multiecho pulse sequences of the brain and surrounding structures, and cervical and thoracic spine were obtained without and with intravenous contrast. COMPARISON:  04/20/2021 FINDINGS: MRI HEAD FINDINGS Brain: No acute infarct,  mass effect or extra-axial collection. No acute or chronic hemorrhage. Confluent bilateral periventricular and pericallosal white matter lesions are unchanged. Lesions within both cerebellar peduncles are also unchanged. The midline structures are normal. There is no abnormal contrast enhancement. Vascular: Major flow voids are preserved. Skull and upper cervical spine: Normal calvarium and skull base. Visualized upper cervical spine and soft tissues are normal. Sinuses/Orbits:No paranasal sinus fluid levels or advanced mucosal thickening. No mastoid or middle ear effusion. Normal orbits. MRI CERVICAL SPINE FINDINGS Alignment: Physiologic. Vertebrae: No fracture, evidence of discitis, or bone lesion. Cord: Chronic demyelinating lesions within the upper cervical spinal cord at the C2 and C3 levels  are unchanged. There are no enhancing lesions. Posterior Fossa, vertebral arteries, paraspinal tissues: Negative Disc levels: C1-2: Unremarkable. C2-3: Normal disc space and facet joints. There is no spinal canal stenosis. No neural foraminal stenosis. C3-4: Normal disc space and facet joints. There is no spinal canal stenosis. No neural foraminal stenosis. C4-5: Unchanged small disc bulge with bilateral uncovertebral osteophytes. There is no spinal canal stenosis. Unchanged mild bilateral neural foraminal stenosis. C5-6: Normal disc space and facet joints. There is no spinal canal stenosis. No neural foraminal stenosis. C6-7: Normal disc space and facet joints. There is no spinal canal stenosis. No neural foraminal stenosis. C7-T1: Normal disc space and facet joints. There is no spinal canal stenosis. No neural foraminal stenosis. MRI THORACIC SPINE FINDINGS Alignment:  Physiologic. Vertebrae: No fracture, evidence of discitis, or bone lesion. Cord:  Normal signal and morphology. Paraspinal and other soft tissues: Negative. Disc levels: No spinal canal stenosis. IMPRESSION: 1. Unchanged distribution of white matter lesions  consistent with multiple sclerosis. No evidence of active demyelination. 2. Unchanged appearance of chronic demyelinating lesions of the upper cervical spinal cord. 3. No evidence of demyelinating disease within the thoracic spinal cord. 4. Unchanged mild C4-5 degenerative disc disease with mild bilateral neural foraminal stenosis. Electronically Signed   By: Deatra Robinson M.D.   On: 01/12/2022 20:24   CT Angio Chest PE W and/or Wo Contrast  Result Date: 01/12/2022 CLINICAL DATA:  Shortness of breath, cough EXAM: CT ANGIOGRAPHY CHEST WITH CONTRAST TECHNIQUE: Multidetector CT imaging of the chest was performed using the standard protocol during bolus administration of intravenous contrast. Multiplanar CT image reconstructions and MIPs were obtained to evaluate the vascular anatomy. RADIATION DOSE REDUCTION: This exam was performed according to the departmental dose-optimization program which includes automated exposure control, adjustment of the mA and/or kV according to patient size and/or use of iterative reconstruction technique. CONTRAST:  OMNIPAQUE IOHEXOL 350 MG/ML SOLN COMPARISON:  Previous studies including the chest radiographs done earlier today FINDINGS: Cardiovascular: There is homogeneous enhancement in thoracic aorta. There are no intraluminal filling defects in the central pulmonary artery branches. Evaluation of small peripheral branches is limited by motion artifacts. Mediastinum/Nodes: No significant lymphadenopathy seen. Lungs/Pleura: There is no focal pulmonary consolidation. There is no pleural effusion or pneumothorax. Upper Abdomen: Unremarkable. Musculoskeletal: Unremarkable. Review of the MIP images confirms the above findings. IMPRESSION: There is no evidence of central pulmonary artery embolism. Evaluation of small peripheral branches is limited by motion artifacts. There is no evidence of thoracic aortic dissection. There is no focal pulmonary consolidation. Electronically Signed    By: Ernie Avena M.D.   On: 01/12/2022 12:32   DG Chest Port 1 View  Result Date: 01/12/2022 CLINICAL DATA:  Shortness of breath for 1 month EXAM: PORTABLE CHEST 1 VIEW COMPARISON:  12/02/2021 FINDINGS: The heart size and mediastinal contours are within normal limits. Both lungs are clear. The visualized skeletal structures are unremarkable. IMPRESSION: No acute abnormality of the lungs in AP portable projection. Electronically Signed   By: Jearld Lesch M.D.   On: 01/12/2022 10:46     Discharge Instructions: Discharge Instructions     Call MD for:  difficulty breathing, headache or visual disturbances   Complete by: As directed    Call MD for:  persistant dizziness or light-headedness   Complete by: As directed    Call MD for:  persistant nausea and vomiting   Complete by: As directed    Call MD for:  redness, tenderness, or signs of  infection (pain, swelling, redness, odor or green/yellow discharge around incision site)   Complete by: As directed    Call MD for:  severe uncontrolled pain   Complete by: As directed    Diet - low sodium heart healthy   Complete by: As directed    Discharge instructions   Complete by: As directed    Ms. Culmer,  It was a pleasure taking care of you during this admission.  You were hospitalized for an asthma attack.  We treated you with steroids and breathing treatments.  To prevent another attack, we started you on a daily inhaler called Advair.  Please take 1 puff 2 times daily.  This is not a rescue inhaler.  You can use your albuterol inhaler as needed for shortness of breath.  But if you have to use it multiple times a day for 1-2 days, please either let your primary care doctor know or seek help immediately.  We also started you on an allergy medication and cough medication as well.  Your heart rate was high on admission.  We rule out thyroid disease or structural heart disease.  This is likely due to excessive albuterol use and  phentermine.  Please stop taking phentermine until you see your regular doctor or your neurologist.  Please follow-up with your regular doctor in 1-2 weeks.  Take care,  Dr. Cyndie Chime   Increase activity slowly   Complete by: As directed        Signed: Doran Stabler, DO 01/15/2022, 12:56 PM   Pager: [phone]

## 2022-01-16 ENCOUNTER — Other Ambulatory Visit (HOSPITAL_COMMUNITY): Payer: Self-pay

## 2022-01-16 DIAGNOSIS — R29898 Other symptoms and signs involving the musculoskeletal system: Secondary | ICD-10-CM | POA: Diagnosis not present

## 2022-01-16 DIAGNOSIS — R000 Tachycardia, unspecified: Secondary | ICD-10-CM | POA: Diagnosis not present

## 2022-01-16 DIAGNOSIS — J4541 Moderate persistent asthma with (acute) exacerbation: Secondary | ICD-10-CM | POA: Diagnosis not present

## 2022-01-16 DIAGNOSIS — G35 Multiple sclerosis: Secondary | ICD-10-CM | POA: Diagnosis not present

## 2022-01-16 NOTE — Progress Notes (Signed)
   01/16/22 1932  AVS Discharge Documentation  AVS Discharge Instructions Including Medications Provided to patient/caregiver  Name of Person Receiving AVS Discharge Instructions Including Medications Lindsay Schneider  Name of Clinician That Reviewed AVS Discharge Instructions Including Medications Clarnce Flock, RN

## 2022-01-16 NOTE — Progress Notes (Addendum)
Patient was medically stable for discharged on 01/15/2022.  Unfortunately she had issue with transportation so she remained in the hospital until 01/16/2022.  Patient was evaluated this morning at bedside with her aunt.  She appears comfortable in no acute distress.  Endorses mild shortness of breath but her cough have improved.  Her aunt states that patient is comfortable using the Advair.  Regarding transportation, they will try to get her father to pick her up in the afternoon.  Physical exam Physical Exam Constitutional:      General: She is not in acute distress. Eyes:     General:        Right eye: No discharge.        Left eye: No discharge.     Conjunctiva/sclera: Conjunctivae normal.  Cardiovascular:     Rate and Rhythm: Regular rhythm. Tachycardia present.  Pulmonary:     Effort: Pulmonary effort is normal. No respiratory distress.     Breath sounds: Normal breath sounds.  Musculoskeletal:     Cervical back: Normal range of motion.  Skin:    General: Skin is warm.  Neurological:     Mental Status: She is alert and oriented to person, place, and time.  Psychiatric:        Mood and Affect: Mood normal.     -Patient remains medically stable for discharge -Since patient received her last dose of prednisone in the hospital.  We will cancel outpatient pharmacy prescription. -Spoke with social worker to have ambulance transportation ready in case her father cannot pick her up today. -Social worker will also provide information for personal care service.

## 2022-01-17 ENCOUNTER — Other Ambulatory Visit (HOSPITAL_COMMUNITY): Payer: Self-pay

## 2022-01-18 DIAGNOSIS — Z9181 History of falling: Secondary | ICD-10-CM | POA: Diagnosis not present

## 2022-01-18 DIAGNOSIS — Z79899 Other long term (current) drug therapy: Secondary | ICD-10-CM | POA: Diagnosis not present

## 2022-01-18 DIAGNOSIS — Z7951 Long term (current) use of inhaled steroids: Secondary | ICD-10-CM | POA: Diagnosis not present

## 2022-01-18 DIAGNOSIS — G35 Multiple sclerosis: Secondary | ICD-10-CM | POA: Diagnosis not present

## 2022-01-18 DIAGNOSIS — G8112 Spastic hemiplegia affecting left dominant side: Secondary | ICD-10-CM | POA: Diagnosis not present

## 2022-01-18 DIAGNOSIS — R000 Tachycardia, unspecified: Secondary | ICD-10-CM | POA: Diagnosis not present

## 2022-01-18 DIAGNOSIS — J45909 Unspecified asthma, uncomplicated: Secondary | ICD-10-CM | POA: Diagnosis not present

## 2022-01-18 DIAGNOSIS — M50321 Other cervical disc degeneration at C4-C5 level: Secondary | ICD-10-CM | POA: Diagnosis not present

## 2022-01-18 DIAGNOSIS — I10 Essential (primary) hypertension: Secondary | ICD-10-CM | POA: Diagnosis not present

## 2022-01-18 DIAGNOSIS — M21372 Foot drop, left foot: Secondary | ICD-10-CM | POA: Diagnosis not present

## 2022-01-18 DIAGNOSIS — M9981 Other biomechanical lesions of cervical region: Secondary | ICD-10-CM | POA: Diagnosis not present

## 2022-01-19 DIAGNOSIS — R000 Tachycardia, unspecified: Secondary | ICD-10-CM | POA: Diagnosis not present

## 2022-01-19 DIAGNOSIS — I10 Essential (primary) hypertension: Secondary | ICD-10-CM | POA: Diagnosis not present

## 2022-01-19 DIAGNOSIS — G35 Multiple sclerosis: Secondary | ICD-10-CM | POA: Diagnosis not present

## 2022-01-19 DIAGNOSIS — G8112 Spastic hemiplegia affecting left dominant side: Secondary | ICD-10-CM | POA: Diagnosis not present

## 2022-01-19 DIAGNOSIS — J45909 Unspecified asthma, uncomplicated: Secondary | ICD-10-CM | POA: Diagnosis not present

## 2022-01-19 DIAGNOSIS — M50321 Other cervical disc degeneration at C4-C5 level: Secondary | ICD-10-CM | POA: Diagnosis not present

## 2022-01-20 DIAGNOSIS — I10 Essential (primary) hypertension: Secondary | ICD-10-CM | POA: Diagnosis not present

## 2022-01-20 DIAGNOSIS — G8112 Spastic hemiplegia affecting left dominant side: Secondary | ICD-10-CM | POA: Diagnosis not present

## 2022-01-20 DIAGNOSIS — J45909 Unspecified asthma, uncomplicated: Secondary | ICD-10-CM | POA: Diagnosis not present

## 2022-01-20 DIAGNOSIS — J209 Acute bronchitis, unspecified: Secondary | ICD-10-CM | POA: Diagnosis not present

## 2022-01-20 DIAGNOSIS — G35 Multiple sclerosis: Secondary | ICD-10-CM | POA: Diagnosis not present

## 2022-01-20 DIAGNOSIS — M6281 Muscle weakness (generalized): Secondary | ICD-10-CM | POA: Diagnosis not present

## 2022-01-20 DIAGNOSIS — R000 Tachycardia, unspecified: Secondary | ICD-10-CM | POA: Diagnosis not present

## 2022-01-20 DIAGNOSIS — M50321 Other cervical disc degeneration at C4-C5 level: Secondary | ICD-10-CM | POA: Diagnosis not present

## 2022-01-26 ENCOUNTER — Other Ambulatory Visit: Payer: Self-pay | Admitting: Neurology

## 2022-01-26 DIAGNOSIS — G35 Multiple sclerosis: Secondary | ICD-10-CM

## 2022-01-27 DIAGNOSIS — G8112 Spastic hemiplegia affecting left dominant side: Secondary | ICD-10-CM | POA: Diagnosis not present

## 2022-01-27 DIAGNOSIS — R000 Tachycardia, unspecified: Secondary | ICD-10-CM | POA: Diagnosis not present

## 2022-01-27 DIAGNOSIS — M50321 Other cervical disc degeneration at C4-C5 level: Secondary | ICD-10-CM | POA: Diagnosis not present

## 2022-01-27 DIAGNOSIS — G35 Multiple sclerosis: Secondary | ICD-10-CM | POA: Diagnosis not present

## 2022-01-27 DIAGNOSIS — J45909 Unspecified asthma, uncomplicated: Secondary | ICD-10-CM | POA: Diagnosis not present

## 2022-01-27 DIAGNOSIS — I10 Essential (primary) hypertension: Secondary | ICD-10-CM | POA: Diagnosis not present

## 2022-01-31 ENCOUNTER — Non-Acute Institutional Stay (HOSPITAL_COMMUNITY)
Admission: RE | Admit: 2022-01-31 | Discharge: 2022-01-31 | Disposition: A | Discharge status: Home / Self Care | Payer: Medicare Other | Source: Ambulatory Visit | Attending: Internal Medicine | Admitting: Internal Medicine

## 2022-01-31 DIAGNOSIS — G35 Multiple sclerosis: Secondary | ICD-10-CM | POA: Insufficient documentation

## 2022-01-31 MED ORDER — SODIUM CHLORIDE 0.9 % IV SOLN
600.0000 mg | Freq: Once | INTRAVENOUS | Status: AC
  Administered 2022-01-31: 600 mg via INTRAVENOUS
  Filled 2022-01-31: qty 20

## 2022-01-31 MED ORDER — DIPHENHYDRAMINE HCL 50 MG/ML IJ SOLN
50.0000 mg | Freq: Once | INTRAMUSCULAR | Status: AC
  Administered 2022-01-31: 50 mg via INTRAVENOUS
  Filled 2022-01-31: qty 1

## 2022-01-31 MED ORDER — FAMOTIDINE IN NACL 20-0.9 MG/50ML-% IV SOLN
20.0000 mg | Freq: Once | INTRAVENOUS | Status: AC
  Administered 2022-01-31: 20 mg via INTRAVENOUS
  Filled 2022-01-31: qty 50

## 2022-01-31 MED ORDER — METHYLPREDNISOLONE SODIUM SUCC 125 MG IJ SOLR
125.0000 mg | Freq: Once | INTRAMUSCULAR | Status: AC
  Administered 2022-01-31: 125 mg via INTRAVENOUS
  Filled 2022-01-31: qty 2

## 2022-01-31 MED ORDER — SODIUM CHLORIDE 0.9 % IV SOLN: INTRAVENOUS | Status: DC | PRN

## 2022-01-31 MED ORDER — ACETAMINOPHEN 325 MG PO TABS
650.0000 mg | ORAL_TABLET | Freq: Once | ORAL | Status: AC
  Administered 2022-01-31: 650 mg via ORAL
  Filled 2022-01-31: qty 2

## 2022-01-31 NOTE — Progress Notes (Signed)
PATIENT CARE CENTER NOTE:  Diagnosis: Multiple Sclerosis G35  Provider: Despina Arias MD  Procedure:  Emogene Morgan 600mg  infusion   Patient received IV Ocrevus. Pre infusion medications - Benadryl IV, Solu-medrol IV, Pepcid IV and Tylenol PO were given per orders. IV access lost during Pepcid infusion, provider's RN made aware, other IV premeds administered previously without incident, ok to resume remainder of pepcid and begin Ocrevus once IV access reestablished. Medication was titrated per protocol. Tolerated well, vitals stable, patient declined to wait for the 60 minutes post infusion observation, discharge instructions given, verbalized understanding. Instructed pt to schedule next infusion in 6 months, verbalized understanding. Prior to infusion, pt states she has swelling in her R leg, she was hospitalized recently and issue was addressed but pt concerned and wanted provider to be made aware. Kara Mead RN notified, pt instructed to discuss ongoing issue with Dr. Baird Lyons at visit scheduled for 7/13, pt made aware and verbalized understanding.  Patient alert and oriented at discharge, taken in wheelchair accompanied by significant other.

## 2022-02-01 DIAGNOSIS — J45909 Unspecified asthma, uncomplicated: Secondary | ICD-10-CM | POA: Diagnosis not present

## 2022-02-01 DIAGNOSIS — G8112 Spastic hemiplegia affecting left dominant side: Secondary | ICD-10-CM | POA: Diagnosis not present

## 2022-02-01 DIAGNOSIS — M50321 Other cervical disc degeneration at C4-C5 level: Secondary | ICD-10-CM | POA: Diagnosis not present

## 2022-02-01 DIAGNOSIS — R000 Tachycardia, unspecified: Secondary | ICD-10-CM | POA: Diagnosis not present

## 2022-02-01 DIAGNOSIS — G35 Multiple sclerosis: Secondary | ICD-10-CM | POA: Diagnosis not present

## 2022-02-01 DIAGNOSIS — I10 Essential (primary) hypertension: Secondary | ICD-10-CM | POA: Diagnosis not present

## 2022-02-02 ENCOUNTER — Ambulatory Visit (INDEPENDENT_AMBULATORY_CARE_PROVIDER_SITE_OTHER): Payer: Medicare Other | Admitting: Neurology

## 2022-02-02 ENCOUNTER — Encounter: Payer: Self-pay | Admitting: Neurology

## 2022-02-02 VITALS — BP 123/80 | HR 102 | Ht 62.0 in

## 2022-02-02 DIAGNOSIS — Z993 Dependence on wheelchair: Secondary | ICD-10-CM | POA: Diagnosis not present

## 2022-02-02 DIAGNOSIS — N399 Disorder of urinary system, unspecified: Secondary | ICD-10-CM

## 2022-02-02 DIAGNOSIS — G8114 Spastic hemiplegia affecting left nondominant side: Secondary | ICD-10-CM

## 2022-02-02 DIAGNOSIS — Z79899 Other long term (current) drug therapy: Secondary | ICD-10-CM

## 2022-02-02 DIAGNOSIS — R600 Localized edema: Secondary | ICD-10-CM | POA: Diagnosis not present

## 2022-02-02 DIAGNOSIS — G35 Multiple sclerosis: Secondary | ICD-10-CM

## 2022-02-02 MED ORDER — BACLOFEN 20 MG PO TABS: 20.0000 mg | ORAL_TABLET | Freq: Four times a day (QID) | ORAL | 11 refills | Status: DC

## 2022-02-02 MED ORDER — GABAPENTIN 300 MG PO CAPS: 300.0000 mg | ORAL_CAPSULE | Freq: Three times a day (TID) | ORAL | 11 refills | Status: DC

## 2022-02-02 NOTE — Progress Notes (Signed)
[facility]  PATIENT: Lindsay Schneider DOB: 1985/11/24    _________________________________   HISTORICAL  CHIEF COMPLAINT:  Chief Complaint  Patient presents with   Follow-up    Rm 1, w aide. Here for 6 month f/u for MS, on Ocrevus and tolerating well. Last infusion date: 02/01/2022 and Next infusion date: 08/03/22. Pt c/o of leg and feet pain. Would like to get foot braces, feet are turning inward. Having buttock pn.     HISTORY OF PRESENT ILLNESS:  Lindsay Schneider is a 36 y.o. woman who was diagnosed with MS in 2011 after presenting with left sided arm and leg numbness and weakness.    She has an active/relapsing secondary progressive MS.    Update 02/02/2022: She was hospitalized 01/12/2022 due to an asthma exacerbation and feeling weaker.  MRIs of fluid brain, cervical spine, thoracic spine and lumbar spine was performed.  There were no new findings compared to the 2022 MRIs.  She had some degenerative changes but nothing causing spinal stenosis or nerve root compression.  She switched to Ocrevus from Tysabri in 2021 after she converted to JCV Ab positive at the end of 2020 (high positive at 3.89).   She tolerates Ocrevus well.    Her first infusion was May and June 2021    .    Her last infusion was yesterday.      She has bilateral foot pain.   Pain is burning sensation in her soles.     She also has pain in the back f her legs.     She is noting more muscle spasms, despite baclofen 10 mg po qid.     She has never been on gabapentin or lamotrigine. For dysesthesia.    Her right arm is good.   Her left arm is very stiff an weak.       She is wheelchair bound and needs help to transfer in/out of the wheelchair.   Her left arm and both legs are weak   Her left hand has gradually worsened.   Right leg has also weakened more.  .  The right arm is strong but has reduced coordination.  Handwriting is poor.   She has trouble keeping the left hand open.    She feels the  spasticity has worsened over the last year.  She is on baclofen 10 mg po qid for the spasticity.   Tizanidine was poorly tolerated    She has intermittent numbness on the left side and both feet      She notes more urinary urgency. Only rare urge  incontinence.    She has reduced VA and color vision OS.   She notes visual changes more in bright sunlight.    She sleeps well at night and wakes up after 8 hours.      MS History:   In 2011, onset of left arm and leg numbness and weakness.  Her symptoms develop over 1 day. She had an MRI performed with lesions consistent with MS. She started to see Dr. Terrace Schneider here at [facility]. She was placed on Rebif. However, she stopped because her legs felt weaker when she was on it. She then transferred her care to Dr. Renne Schneider at [facility].     She was started on Tysabri.  She had several infusions of Tysabri but transportation was difficult to get to [facility]. Therefore, Dr. Renne Schneider switched her to Rituxan although she tolerated the medication well. She had a significant relapse with  bilateral leg weakness within a couple weeks of the infusion. Therefore, 6 months later, she started Tysabri again. Due to difficulties with transportation, she transferred care to [place] in October 2016.  She converted to JCV Ab positive early 2021 She switched to Ocrevus.    Imaging: MRI of the brain, cervical spine 01/13/2022 showed an unchanged distribution of white matter lesions consistent with MS.  There were no enhancing lesions.  MRI of the thoracic spine showed no demyelinating plaques.  No significant degenerative changes.  No spinal stenosis or nerve root compression.  MRI of the lumbar spine 01/13/2022 was unremarkable.  MRI of the brain 04/20/2021 showed T2/FLAIR hyperintense foci in the hemispheres, brainstem and cerebellum in a pattern and configuration consistent with chronic demyelinating plaque associated with multiple sclerosis.  None of the foci appear to be acute.  Compared to the MRI  dated 06/23/2015, there do not appear to be any definite new lesions.  Generalized cortical atrophy that has progressed compared to the 2016 MRI  MRI of the cervical spine 04/20/2021 showed  Multiple discrete and confluent foci within the spinal cord and additional foci noted within the brainstem and cerebellum.  These are consistent with chronic demyelinating plaque associated with multiple sclerosis.  There is no definite change compared to the MRI dated 06/23/2015.   At C4-C5, there are disc osteophyte complexes causing borderline spinal stenosis and mild foraminal narrowing but no nerve root compression.  This has progressed compared to the 2016 MRI.  MRI of the brain 06/25/2015 shows a couple small T2/FLAIR hyperintense foci in the cerebellar hemispheres and other foci in the left middle cerebellar peduncle and pons..   The deep gray matter appears normal.  In the hemispheres, there are are multiple single and confluent T2/FLAIR hyperintense foci, predominantly in the periventricular white matter. Many of these foci are radially oriented to the ventricles.   MRI of the cervical spine 06/23/2015 shows multiple T2 hyperintense foci within the spinal cord. They are located posteriorly to the left adjacent to C2, posteriorly adjacent to C3, posterior to the right adjacent to C4, posterior to the right adjacent to C5, posterior to the right and central adjacent to C6.   None of these foci enhanced after contrast administration.  REVIEW OF SYSTEMS: Constitutional: No fevers, chills, sweats, or change in appetite.   She has fatigue and poor sleep.    Eyes: No visual changes, double vision, eye pain Ear, nose and throat: No hearing loss, ear pain, nasal congestion, sore throat Cardiovascular: No chest pain, palpitations Respiratory:  No shortness of breath at rest or with exertion.   No wheezes GastrointestinaI: No nausea, vomiting, diarrhea, abdominal pain, fecal incontinence Genitourinary:  Shehas  urinary urgency and frequency with occ incontinence.    Hasnocturia. Musculoskeletal:  No neck pain, back pain Integumentary: No rash, pruritus, skin lesions Neurological: as above Psychiatric:   Some depression at this time.  No anxiety Endocrine: No palpitations, diaphoresis, change in appetite, change in weigh or increased thirst Hematologic/Lymphatic:  No anemia, purpura, petechiae. Allergic/Immunologic: No itchy/runny eyes, nasal congestion, recent allergic reactions, rashes  ALLERGIES: No Known Allergies  HOME MEDICATIONS:  Current Outpatient Medications:    acetaminophen (TYLENOL) 500 MG tablet, Take 1,000 mg by mouth every 6 (six) hours as needed for mild pain., Disp: , Rfl:    albuterol (PROVENTIL) (2.5 MG/3ML) 0.083% nebulizer solution, INHALE 3 ML BY NEBULIZATION EVERY 6 HOURS AS NEEDED FOR WHEEZING OR SHORTNESS OF BREATH, Disp: 75 mL, Rfl: 12  albuterol (VENTOLIN HFA) 108 (90 Base) MCG/ACT inhaler, Inhale 2 puffs into the lungs every 6 (six) hours as needed for wheezing or shortness of breath., Disp: 8 g, Rfl: 2   fexofenadine (ALLEGRA ALLERGY) 60 MG tablet, Take 1 tablet (60 mg total) by mouth 2 (two) times daily., Disp: 60 tablet, Rfl: 0   fluticasone-salmeterol (ADVAIR DISKUS) 250-50 MCG/ACT AEPB, Inhale 1 puffs into the lungs in the morning and at bedtime., Disp: 120 each, Rfl: 0   gabapentin (NEURONTIN) 300 MG capsule, Take 1 capsule (300 mg total) by mouth 3 (three) times daily., Disp: 90 capsule, Rfl: 11   guaiFENesin-dextromethorphan (ROBITUSSIN DM) 100-10 MG/5ML syrup, Take 5 mLs by mouth every 4 (four) hours as needed for cough., Disp: 118 mL, Rfl: 0   hydrochlorothiazide (HYDRODIURIL) 25 MG tablet, TAKE 1 TABLET (25 MG TOTAL) BY MOUTH DAILY., Disp: 90 tablet, Rfl: 0   Vitamin D, Ergocalciferol, (DRISDOL) 1.25 MG (50000 UNIT) CAPS capsule, TAKE 1 CAPSULE (50,000 UNITS TOTAL) BY MOUTH EVERY 7 (SEVEN) DAYS (Patient taking differently: Take 50,000 Units by mouth every  Monday.), Disp: 13 capsule, Rfl: 2   baclofen (LIORESAL) 20 MG tablet, Take 1 tablet (20 mg total) by mouth 4 (four) times daily., Disp: 90 tablet, Rfl: 11  PAST MEDICAL HISTORY: Past Medical History:  Diagnosis Date   Bilateral leg weakness 04/27/2015   Left foot drop 02/08/2017   Movement disorder    MS (multiple sclerosis) (HCC)    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    PAST SURGICAL HISTORY: Past Surgical History:  Procedure Laterality Date   CESAREAN SECTION      FAMILY HISTORY: Family History  Problem Relation Age of Onset   Asthma Mother    Hypertension Father    Healthy Sister    Healthy Brother     SOCIAL HISTORY:  Social History   Socioeconomic History   Marital status: Single    Spouse name: Not on file   Number of children: Not on file   Years of education: Not on file   Highest education level: Not on file  Occupational History   Not on file  Tobacco Use   Smoking status: Never   Smokeless tobacco: Never  Vaping Use   Vaping Use: Never used  Substance and Sexual Activity   Alcohol use: No   Drug use: No   Sexual activity: Not Currently    Birth control/protection: Injection  Other Topics Concern   Not on file  Social History Narrative   Not on file   Social Determinants of Health   Financial Resource Strain: Not on file  Food Insecurity: Not on file  Transportation Needs: Not on file  Physical Activity: Not on file  Stress: Not on file  Social Connections: Not on file  Intimate Partner Violence: Not on file     PHYSICAL EXAM  Vitals:   02/02/22 1602  BP: 123/80  Pulse: (!) 102  Height: 5\' 2"  (1.575 m)     Body mass index is 30.89 kg/m.   General: The patient is well-developed and well-nourished and in no acute distress  Neurologic Exam  Mental status: The patient is alert and oriented x 3 at the time of the examination. The patient has apparent normal recent and remote memory, with an  apparently normal attention span and concentration ability.   Speech is normal.  Cranial nerves: Extraocular movements are full.. Facial strength and sensation is normal. Trapezius strength is normal.  The tongue is  midline, and the patient has symmetric elevation of the soft palate. No obvious hearing deficits are noted.  Motor: Muscle bulk is normal.  Muscle tone is greatly increased in the left arm and both legs, left greater than right.   Right arm has good tone/strength but reduced RAM.  Strength is 2/5 extension and 3/5 flexion in left amr. . Strength is 2-  in proximal right and left leg and 2 in right knee extension and 2- in left knee extension , 2- right and  1 left ankle extensors,    Sensory: she has intact sensation to touch and vibration in the arms.  Normal touch/temperature sensation in the legs but reduced vibration sensation in the left leg.  Coordination: She has mildly reduced finger-nose-finger on the right.  She is unable to do on the left.  She is too weak for heel-to-shin either side  Gait and station: She is unable to stand or walk.  Reflexes: Deep tendon reflexes are increased in arms, left > right and in legs with spread att knees and sustained clonus at the left ankle and nonsustained at the right.       DIAGNOSTIC DATA (LABS, IMAGING, TESTING) - I reviewed patient records, labs, notes, testing and imaging myself where available.  Lab Results  Component Value Date   WBC 14.3 (H) 01/13/2022   HGB 13.1 01/13/2022   HCT 38.6 01/13/2022   MCV 79.1 (L) 01/13/2022   PLT 347 01/13/2022      Component Value Date/Time   NA 134 (L) 01/13/2022 0148   NA 138 08/02/2021 1544   K 3.6 01/13/2022 0148   CL 99 01/13/2022 0148   CO2 21 (L) 01/13/2022 0148   GLUCOSE 112 (H) 01/13/2022 0148   BUN 5 (L) 01/13/2022 0148   BUN 6 08/02/2021 1544   CREATININE 0.65 01/13/2022 0148   CALCIUM 9.5 01/13/2022 0148   PROT 7.1 01/13/2022 0148   PROT 7.0 08/02/2021 1544   ALBUMIN  3.8 01/13/2022 0148   ALBUMIN 4.4 08/02/2021 1544   AST 20 01/13/2022 0148   ALT 23 01/13/2022 0148   ALKPHOS 68 01/13/2022 0148   BILITOT 0.8 01/13/2022 0148   BILITOT 0.4 08/02/2021 1544   GFRNONAA >60 01/13/2022 0148   GFRAA 144 05/21/2019 1057       ASSESSMENT AND PLAN  Multiple sclerosis (HCC) - Plan: Comprehensive metabolic panel, Thyroid Panel With TSH, CBC with Differential/Platelet, IgG, IgA, IgM  High risk medication use - Plan: Comprehensive metabolic panel, Thyroid Panel With TSH, CBC with Differential/Platelet, IgG, IgA, IgM  Pedal edema - Plan: Comprehensive metabolic panel, Thyroid Panel With TSH, CBC with Differential/Platelet  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    She has an active form of secondary progressive MS.  Continue Ocrevus.  Check IgG/IgM, CBC with differential and CMP.  2.   Increase baclofen up to 60 mg a day and continue lorazepam nightly to help with her insomnia and nighttime spasticity..  Add gabapentin for dysesthetic pain  3.    Continue HCTZ to 25 mg for hypertension and pedal edema 4.     Return in 6 months or sooner if there are new or worsening neurologic symptoms.  43-minute office visit with the majority of the time spent face-to-face for history and physical, discussion/counseling and decision-making.  Additional time with record review (recent hospital notes and multiple MRIs from recent  hospitalization with reviewed) and documentation.   Tamekia Rotter A. Epimenio Foot, MD, PhD 02/02/2022, 6:26 PM Certified in Neurology, Clinical Neurophysiology, Sleep Medicine, Pain Medicine and Neuroimaging  [facility] [address] [place], [place] [address] [phone]

## 2022-02-03 LAB — COMPREHENSIVE METABOLIC PANEL
ALT: 28 IU/L (ref 0–32)
AST: 16 IU/L (ref 0–40)
Albumin/Globulin Ratio: 1.6 (ref 1.2–2.2)
Albumin: 4.1 g/dL (ref 3.9–4.9)
Alkaline Phosphatase: 81 IU/L (ref 44–121)
BUN/Creatinine Ratio: 12 (ref 9–23)
BUN: 8 mg/dL (ref 6–20)
Bilirubin Total: 0.2 mg/dL (ref 0.0–1.2)
CO2: 22 mmol/L (ref 20–29)
Calcium: 9.1 mg/dL (ref 8.7–10.2)
Chloride: 103 mmol/L (ref 96–106)
Creatinine, Ser: 0.66 mg/dL (ref 0.57–1.00)
Globulin, Total: 2.6 g/dL (ref 1.5–4.5)
Glucose: 104 mg/dL — ABNORMAL HIGH (ref 70–99)
Potassium: 4 mmol/L (ref 3.5–5.2)
Sodium: 140 mmol/L (ref 134–144)
Total Protein: 6.7 g/dL (ref 6.0–8.5)
eGFR: 119 mL/min/{1.73_m2} (ref >59)

## 2022-02-03 LAB — IGG, IGA, IGM
IgA/Immunoglobulin A, Serum: 270 mg/dL (ref 87–352)
IgG (Immunoglobin G), Serum: 997 mg/dL (ref 586–1602)
IgM (Immunoglobulin M), Srm: 36 mg/dL (ref 26–217)

## 2022-02-03 LAB — CBC WITH DIFFERENTIAL/PLATELET
Basophils Absolute: 0 10*3/uL (ref 0.0–0.2)
Basos: 0 %
EOS (ABSOLUTE): 0.2 10*3/uL (ref 0.0–0.4)
Eos: 2 %
Hematocrit: 35.9 % (ref 34.0–46.6)
Hemoglobin: 11.6 g/dL (ref 11.1–15.9)
Immature Grans (Abs): 0 10*3/uL (ref 0.0–0.1)
Immature Granulocytes: 0 %
Lymphocytes Absolute: 2.2 10*3/uL (ref 0.7–3.1)
Lymphs: 23 %
MCH: 26.4 pg — ABNORMAL LOW (ref 26.6–33.0)
MCHC: 32.3 g/dL (ref 31.5–35.7)
MCV: 82 fL (ref 79–97)
Monocytes Absolute: 0.8 10*3/uL (ref 0.1–0.9)
Monocytes: 9 %
Neutrophils Absolute: 6.2 10*3/uL (ref 1.4–7.0)
Neutrophils: 66 %
Platelets: 264 10*3/uL (ref 150–450)
RBC: 4.4 x10E6/uL (ref 3.77–5.28)
RDW: 14.7 % (ref 11.7–15.4)
WBC: 9.5 10*3/uL (ref 3.4–10.8)

## 2022-02-03 LAB — THYROID PANEL WITH TSH
Free Thyroxine Index: 2.4 (ref 1.2–4.9)
T3 Uptake Ratio: 28 % (ref 24–39)
T4, Total: 8.5 ug/dL (ref 4.5–12.0)
TSH: 3.16 u[IU]/mL (ref 0.450–4.500)

## 2022-02-06 ENCOUNTER — Telehealth: Payer: Self-pay | Admitting: Neurology

## 2022-02-06 NOTE — Telephone Encounter (Signed)
Called the patient to advise of the normal lab results. There was no answer. LVM advising of this information and instructed the pt to call back with questions.

## 2022-02-06 NOTE — Telephone Encounter (Signed)
-----   Message from Asa Lente, MD sent at 02/03/2022  2:09 PM EDT ----- Please let her know that the lab work was normal.

## 2022-02-09 ENCOUNTER — Telehealth: Payer: Self-pay | Admitting: *Deleted

## 2022-02-09 NOTE — Telephone Encounter (Signed)
Called [employer] part A [phone] and spoke w/ Peyton Najjar. States no PA needed for Ocrevus [phone]). Ref# for call [id number].   Last infusion 02/01/22 and next one 08/03/22. Last orders for Ocrevus placed 01/26/22.

## 2022-02-15 DIAGNOSIS — R000 Tachycardia, unspecified: Secondary | ICD-10-CM | POA: Diagnosis not present

## 2022-02-15 DIAGNOSIS — G8112 Spastic hemiplegia affecting left dominant side: Secondary | ICD-10-CM | POA: Diagnosis not present

## 2022-02-15 DIAGNOSIS — I10 Essential (primary) hypertension: Secondary | ICD-10-CM | POA: Diagnosis not present

## 2022-02-15 DIAGNOSIS — G35 Multiple sclerosis: Secondary | ICD-10-CM | POA: Diagnosis not present

## 2022-02-15 DIAGNOSIS — J45909 Unspecified asthma, uncomplicated: Secondary | ICD-10-CM | POA: Diagnosis not present

## 2022-02-15 DIAGNOSIS — M50321 Other cervical disc degeneration at C4-C5 level: Secondary | ICD-10-CM | POA: Diagnosis not present

## 2022-02-17 DIAGNOSIS — J45909 Unspecified asthma, uncomplicated: Secondary | ICD-10-CM | POA: Diagnosis not present

## 2022-02-17 DIAGNOSIS — R000 Tachycardia, unspecified: Secondary | ICD-10-CM | POA: Diagnosis not present

## 2022-02-17 DIAGNOSIS — G8112 Spastic hemiplegia affecting left dominant side: Secondary | ICD-10-CM | POA: Diagnosis not present

## 2022-02-17 DIAGNOSIS — Z79899 Other long term (current) drug therapy: Secondary | ICD-10-CM | POA: Diagnosis not present

## 2022-02-17 DIAGNOSIS — Z7951 Long term (current) use of inhaled steroids: Secondary | ICD-10-CM | POA: Diagnosis not present

## 2022-02-17 DIAGNOSIS — Z9181 History of falling: Secondary | ICD-10-CM | POA: Diagnosis not present

## 2022-02-17 DIAGNOSIS — M50321 Other cervical disc degeneration at C4-C5 level: Secondary | ICD-10-CM | POA: Diagnosis not present

## 2022-02-17 DIAGNOSIS — I10 Essential (primary) hypertension: Secondary | ICD-10-CM | POA: Diagnosis not present

## 2022-02-17 DIAGNOSIS — M21372 Foot drop, left foot: Secondary | ICD-10-CM | POA: Diagnosis not present

## 2022-02-17 DIAGNOSIS — G35 Multiple sclerosis: Secondary | ICD-10-CM | POA: Diagnosis not present

## 2022-02-17 DIAGNOSIS — M9981 Other biomechanical lesions of cervical region: Secondary | ICD-10-CM | POA: Diagnosis not present

## 2022-02-23 DIAGNOSIS — R053 Chronic cough: Secondary | ICD-10-CM | POA: Diagnosis not present

## 2022-02-23 DIAGNOSIS — R062 Wheezing: Secondary | ICD-10-CM | POA: Diagnosis not present

## 2022-02-23 DIAGNOSIS — J302 Other seasonal allergic rhinitis: Secondary | ICD-10-CM | POA: Diagnosis not present

## 2022-02-23 DIAGNOSIS — J209 Acute bronchitis, unspecified: Secondary | ICD-10-CM | POA: Diagnosis not present

## 2022-02-23 DIAGNOSIS — M6281 Muscle weakness (generalized): Secondary | ICD-10-CM | POA: Diagnosis not present

## 2022-02-23 DIAGNOSIS — I10 Essential (primary) hypertension: Secondary | ICD-10-CM | POA: Diagnosis not present

## 2022-03-01 DIAGNOSIS — M50321 Other cervical disc degeneration at C4-C5 level: Secondary | ICD-10-CM | POA: Diagnosis not present

## 2022-03-01 DIAGNOSIS — R000 Tachycardia, unspecified: Secondary | ICD-10-CM | POA: Diagnosis not present

## 2022-03-01 DIAGNOSIS — I10 Essential (primary) hypertension: Secondary | ICD-10-CM | POA: Diagnosis not present

## 2022-03-01 DIAGNOSIS — G35 Multiple sclerosis: Secondary | ICD-10-CM | POA: Diagnosis not present

## 2022-03-01 DIAGNOSIS — J45909 Unspecified asthma, uncomplicated: Secondary | ICD-10-CM | POA: Diagnosis not present

## 2022-03-01 DIAGNOSIS — G8112 Spastic hemiplegia affecting left dominant side: Secondary | ICD-10-CM | POA: Diagnosis not present

## 2022-04-08 ENCOUNTER — Other Ambulatory Visit: Payer: Self-pay | Admitting: Student

## 2022-06-08 ENCOUNTER — Other Ambulatory Visit: Payer: Self-pay | Admitting: Neurology

## 2022-06-08 DIAGNOSIS — G35 Multiple sclerosis: Secondary | ICD-10-CM

## 2022-06-08 DIAGNOSIS — R269 Unspecified abnormalities of gait and mobility: Secondary | ICD-10-CM

## 2022-06-08 MED ORDER — DALFAMPRIDINE ER 10 MG PO TB12: 10.0000 mg | ORAL_TABLET | Freq: Two times a day (BID) | ORAL | 0 refills | Status: DC

## 2022-06-08 NOTE — Telephone Encounter (Signed)
Called the patient back and she states that she called to get a refill for her ampyra and the phramacy told her to contact us.  I do not see dalfampridine listed as a active medication. Upon reviewing the chart it appears it was dc'd by Dr Epimenio Foot on 02/02/2022. At the last ov in July it was not mentioned or discussed as being a medication to continue and I do not see it as a active medication on her list. I have asked the patient when was the last time she took it. She said she had her last dose yesterday and she is already noticing a difference not getting it today. She states that her legs are really stiff and hard to move her legs today. She is asking if a refill can be sent in. I advised I would send this to Dr Epimenio Foot to see if he is ok with her taking it. I am unsure what the reason was for being discontinued.   Pt verbalized understanding. If ok to restart asked it is sent in [facility].

## 2022-06-08 NOTE — Telephone Encounter (Signed)
Pt states she is very stiff and would like a call to discuss getting back on her [facility]

## 2022-06-08 NOTE — Addendum Note (Signed)
Addended by: Judi Cong on: 06/08/2022 03:10 PM   Modules accepted: Orders

## 2022-06-09 NOTE — Addendum Note (Signed)
Addended by: Arther Abbott on: 06/09/2022 08:38 AM   Modules accepted: Orders

## 2022-06-09 NOTE — Telephone Encounter (Signed)
Called pt. Relayed [name] sent in refill for dalfampridine to [facility] last night for her. She will f/u with them to set up shipment.  She also asked for referral to PT to [facility]. I placed referral.

## 2022-06-12 ENCOUNTER — Telehealth: Payer: Self-pay | Admitting: Neurology

## 2022-06-12 DIAGNOSIS — G35 Multiple sclerosis: Secondary | ICD-10-CM

## 2022-06-12 DIAGNOSIS — R269 Unspecified abnormalities of gait and mobility: Secondary | ICD-10-CM

## 2022-06-12 MED ORDER — DALFAMPRIDINE ER 10 MG PO TB12: 10.0000 mg | ORAL_TABLET | Freq: Two times a day (BID) | ORAL | 1 refills | Status: DC

## 2022-06-12 NOTE — Telephone Encounter (Signed)
Resent [facility].

## 2022-06-12 NOTE — Telephone Encounter (Signed)
Pt is requesting a refill for dalfampridine 10 MG TB12.  Pharmacy: [facility]

## 2022-06-19 ENCOUNTER — Telehealth: Payer: Self-pay | Admitting: Neurology

## 2022-06-19 MED ORDER — BACLOFEN 20 MG PO TABS: 20.0000 mg | ORAL_TABLET | Freq: Three times a day (TID) | ORAL | 8 refills | Status: DC

## 2022-06-19 NOTE — Telephone Encounter (Addendum)
Called pt at [phone]. Not in service.   Called [phone]. Spoke w/ family. Advised per last office note, [name] did not take her off baclofen. He increased for her to take up to 60mg  per day (1 tablet po q 8 hr). [facility]  Primary phone # for pt now: [phone]

## 2022-06-19 NOTE — Telephone Encounter (Signed)
Tried calling pt back at [phone]. Number not in service. Called [phone]. LVM for pt to call office.

## 2022-06-19 NOTE — Addendum Note (Signed)
Addended by: Arther Abbott on: 06/19/2022 01:45 PM   Modules accepted: Orders

## 2022-06-19 NOTE — Telephone Encounter (Signed)
Pt is asking if Dr Epimenio Foot will put him back on baclofen (LIORESAL) 20 MG tablet  90 day, pt states what she is on does not help, she is still stiff.

## 2022-06-21 ENCOUNTER — Other Ambulatory Visit (HOSPITAL_COMMUNITY): Payer: Self-pay

## 2022-06-27 ENCOUNTER — Other Ambulatory Visit: Payer: Self-pay | Admitting: Neurology

## 2022-07-11 ENCOUNTER — Encounter: Payer: Self-pay | Admitting: Physical Therapy

## 2022-07-11 ENCOUNTER — Other Ambulatory Visit: Payer: Self-pay

## 2022-07-11 ENCOUNTER — Telehealth: Payer: Self-pay | Admitting: Physical Therapy

## 2022-07-11 ENCOUNTER — Ambulatory Visit: Payer: Medicare (Managed Care) | Attending: Neurology | Admitting: Physical Therapy

## 2022-07-11 DIAGNOSIS — M6281 Muscle weakness (generalized): Secondary | ICD-10-CM | POA: Insufficient documentation

## 2022-07-11 DIAGNOSIS — G35 Multiple sclerosis: Secondary | ICD-10-CM | POA: Diagnosis not present

## 2022-07-11 DIAGNOSIS — R269 Unspecified abnormalities of gait and mobility: Secondary | ICD-10-CM | POA: Diagnosis present

## 2022-07-11 NOTE — Therapy (Signed)
OUTPATIENT PHYSICAL THERAPY NEURO EVALUATION   Patient Name: Lindsay Schneider MRN: [id number] DOB:1986-03-11, 36 y.o., female Today's Date: 07/11/2022   PCP: [facility] - comes to house for visits  REFERRING PROVIDER: Asa Lente,  END OF SESSION:  PT End of Session - 07/11/22 1028     Visit Number 1    Number of Visits 7    Date for PT Re-Evaluation 08/22/22    Authorization Type [facility] Medical [employer] Advantage    PT Start Time 1025    PT Stop Time 1103    PT Time Calculation (min) 38 min    Activity Tolerance Patient tolerated treatment well    Behavior During Therapy [facility] for tasks assessed/performed             Past Medical History:  Diagnosis Date   Bilateral leg weakness 04/27/2015   Left foot drop 02/08/2017   Movement disorder    MS (multiple sclerosis) (HCC)    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   Past Surgical History:  Procedure Laterality Date   CESAREAN SECTION     Patient Active Problem List   Diagnosis Date Noted   High risk medication use 02/02/2022   Wheelchair dependence 02/02/2022   Acute asthma exacerbation 01/12/2022   Asthma exacerbation 11/25/2021   Tachycardia 11/25/2021   Pedal edema 11/25/2021   HTN (hypertension) 11/25/2021   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) 12/27/2016   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   Gait disturbance 04/27/2015   Urinary disorder 04/27/2015   Right leg weakness 04/27/2015    ONSET DATE: 06/09/2022  REFERRING DIAG: G35 (ICD-10-CM) - Multiple sclerosis (HCC) R26.9 (ICD-10-CM) - Gait disturbance  THERAPY DIAG:  Abnormality of gait and mobility - Plan: PT plan of care cert/re-cert  Muscle weakness (generalized) - Plan: PT plan of care cert/re-cert  Rationale for Evaluation and Treatment: Rehabilitation  SUBJECTIVE:                                                                                                                                                                                              SUBJECTIVE STATEMENT: Patient is 36 y.o. female referred for physical therapy with reports increased weakness in her legs R>L over recent months. She also reports increased spasticity in LUE. She also reports difficulty opening hand and managing UE spasticity. Patient reports that she wants to get to standing and stretching her arm. Patient wants to get back to standing. Patient reports that she is receiving help from caregiver to help stretch her arms and legs. Patient has an aid  that helps with transfers. Denies significant fatigue related to MS.  Pt accompanied by: family member - boyfriend Alycia Rossetti)  PERTINENT HISTORY:   Per Note on 02/02/2022 "MS History:   In 2011, onset of left arm and leg numbness and weakness.  Her symptoms develop over 1 day. She had an MRI performed with lesions consistent with MS. She started to see Dr. Terrace Arabia here at [facility]. She was placed on Rebif. However, she stopped because her legs felt weaker when she was on it. She then transferred her care to Dr. Renne Crigler at [facility].     She was started on Tysabri.  She had several infusions of Tysabri but transportation was difficult to get to [facility]. Therefore, Dr. Renne Crigler switched her to Rituxan although she tolerated the medication well. She had a significant relapse with bilateral leg weakness within a couple weeks of the infusion. Therefore, 6 months later, she started Tysabri again. Due to difficulties with transportation, she transferred care to [place] in October 2016.  She converted to JCV Ab positive early 2021 She switched to Ocrevus."     PAIN:  Are you having pain? No  PRECAUTIONS: Fall  WEIGHT BEARING RESTRICTIONS: No  FALLS: Has patient fallen in last 6 months? No  LIVING ENVIRONMENT: Lives with: lives with their family Lives in: House/apartment Stairs:  has ramps Has following equipment at home: Wheelchair  (power), Shower bench, and bed side commode   PLOF: Needs assistance with ADLs, Needs assistance with homemaking, and Needs assistance with transfers  PATIENT GOALS: "I would like to stand and stretch my arm."  OBJECTIVE:   DIAGNOSTIC FINDINGS:   Per note on 02/02/2022 "MRI of the brain, cervical spine 01/13/2022 showed an unchanged distribution of white matter lesions consistent with MS.  There were no enhancing lesions.  MRI of the thoracic spine showed no demyelinating plaques.  No significant degenerative changes.  No spinal stenosis or nerve root compression.   MRI of the lumbar spine 01/13/2022 was unremarkable.   MRI of the brain 04/20/2021 showed T2/FLAIR hyperintense foci in the hemispheres, brainstem and cerebellum in a pattern and configuration consistent with chronic demyelinating plaque associated with multiple sclerosis.  None of the foci appear to be acute.  Compared to the MRI dated 06/23/2015, there do not appear to be any definite new lesions.  Generalized cortical atrophy that has progressed compared to the 2016 MRI   MRI of the cervical spine 04/20/2021 showed  Multiple discrete and confluent foci within the spinal cord and additional foci noted within the brainstem and cerebellum.  These are consistent with chronic demyelinating plaque associated with multiple sclerosis.  There is no definite change compared to the MRI dated 06/23/2015.   At C4-C5, there are disc osteophyte complexes causing borderline spinal stenosis and mild foraminal narrowing but no nerve root compression.  This has progressed compared to the 2016 MRI.   MRI of the brain 06/25/2015 shows a couple small T2/FLAIR hyperintense foci in the cerebellar hemispheres and other foci in the left middle cerebellar peduncle and pons..   The deep gray matter appears normal.  In the hemispheres, there are are multiple single and confluent T2/FLAIR hyperintense foci, predominantly in the periventricular white matter. Many of  these foci are radially oriented to the ventricles.    MRI of the cervical spine 06/23/2015 shows multiple T2 hyperintense foci within the spinal cord. They are located posteriorly to the left adjacent to C2, posteriorly adjacent to C3, posterior to the right adjacent to C4,  posterior to the right adjacent to C5, posterior to the right and central adjacent to C6.   None of these foci enhanced after contrast administration."  COGNITION: Overall cognitive status: Within functional limits for tasks assessed   SENSATION: WFL  COORDINATION: NT due to patinet's mobility  EDEMA:  Mild swelling in her feet with prolonged sitting  MUSCLE TONE: LLE: Mild miled tone; clonus 4 beats per side  POSTURE: Posterior pelvic tilt sitting in wheel chair; leaning to side  LOWER EXTREMITY ROM:    Patient rests in foot supination and hip abduction; able to align to neutral in sitting but patient unable to maintain.  LOWER EXTREMITY MMT:   Patient requires max A for all LE management. Trace to min movement in muscle groups.  BED MOBILITY:  Sit to supine Max A Supine to sit Max A Rolling to Right Max A Rolling to Left Max A Total A for sitting EOB  TRANSFERS: Assistive device utilized: Wheelchair (power)  Sit to stand: NA Stand to sit: NA Chair to chair: Total A  RAMP:  Level of Assistance: Complete Independence Assistive device utilized: Wheelchair (power)  PATIENT SURVEYS:  Multiple Sclerosis Impact Scale (MSIS-29): 125/145 or 86% impairment   TODAY'S TREATMENT:                                                                                                                               Initial eval - not performed today  PATIENT EDUCATION: Education details: Educated on POC and collaborated with goal setting Person educated: Patient and boyfriend Education method: Explanation Education comprehension: verbalized understanding and needs further education  HOME EXERCISE PROGRAM: To be  provided  GOALS: Goals reviewed with patient? Yes  SHORT TERM GOALS: Target date: 08/01/2022  Patient will demonstrate 100% compliance with initial HEP with assistance of caregiver to continue to progress between physical therapy sessions.   Baseline: To be provided Goal status: INITIAL  2. Refer patient to orthotist to assess if patient is appropriate candidate for LE bracing to ensure proper LE stability needed to improve safety with transfers.  Baseline: To be assessed; patient presents with ankle supination and hip abduction Goal status: INITIAL  3.  Patient will perform rolling in B directions with mod A to improve functional mobility. Baseline: Max A Goal status: INITIAL  4.  Patient will sit EOB with max A x 1 in preparation for transfers to improve mobility. Baseline: total Goal status: INITIAL  5.  Patient will reduce score on MSIS-29 by 6% to demonstrate progress towards reduced impairment related to MS. Baseline: 86% Goal status: INITIAL   LONG TERM GOALS: Target date: 08/22/2022  Patient will report demonstrate independence with final HEP with assistance of caregiver in order to maintain current gains and continue to progress after physical therapy discharge.   Baseline: To be provided Goal status: INITIAL  2. Patient will demonstrate tolerance for standing in standing walker x 60" with max A from  therapist for setup to progress tolerance for standing activity.  Baseline: Unable to attempt Goal status: INITIAL  3.  Patient will perform rolling in B directions with min A to improve functional mobility. Baseline: Max A Goal status: INITIAL  4.  Patient will sit EOB with mod A x 1 in preparation for transfers to improve mobility. Baseline: total Goal status: INITIAL  5.  Patient will reduce score on MSIS-29 by 20% to demonstrate progress towards reduced impairment related to MS. Baseline: 86% Goal status: INITIAL    ASSESSMENT:  CLINICAL  IMPRESSION: Patient is a 36 y.o. female who was seen today for physical therapy evaluation and treatment for mobility impairments related to MS. Patient presents with deficits in AROM/PROM, spasticity, and mobility related to MS. Patient is total-maxA for all bed mobility and transfers and has noticed a decline in recent months, resulting in increased caregiver burden. Patient also presents with severe impairments related to MS as indicated by MSIS-29 score of 86%. Patient will also benefit from referral to orthotist for possible bracing to improve LE alignment for transfers and occupational therapy to address UE concerns. Patient will benefit from skilled physical therapy to address these impairments and improve modified independence.  OBJECTIVE IMPAIRMENTS: decreased balance, decreased mobility, difficulty walking, decreased ROM, decreased strength, increased edema, impaired tone, and impaired UE functional use.   ACTIVITY LIMITATIONS: standing, transfers, and bed mobility  PARTICIPATION LIMITATIONS: interpersonal relationship and community activity  PERSONAL FACTORS: Past/current experiences and Time since onset of injury/illness/exacerbation are also affecting patient's functional outcome.   REHAB POTENTIAL: Good  CLINICAL DECISION MAKING: Evolving/moderate complexity  EVALUATION COMPLEXITY: Moderate  PLAN:  PT FREQUENCY: 1x/week  PT DURATION: 6 weeks  PLANNED INTERVENTIONS: Therapeutic exercises, Therapeutic activity, Neuromuscular re-education, Balance training, Gait training, Patient/Family education, Self Care, Joint mobilization, Orthotic/Fit training, and Wheelchair mobility training  PLAN FOR NEXT SESSION: Bed mobility, transfers to and from bed, standing frame as appropriate with LE alignment, create initial HEP  Maryruth Eve, PT, DPT   Carmelia Bake, PT 07/11/2022, 1:03 PM

## 2022-07-11 NOTE — Telephone Encounter (Signed)
[name], Lindsay Schneider (MRN: [id number]) was evaluated by [facility] on 07/11/2022.  The patient would benefit from an occupational therapy evaluation to address patient's upper extremity concerns.    If you agree, please place an order in [facility] workque in [facility] or fax the order to [phone]. Thank you, Maryruth Eve, PT, DPT  [facility] [address] [place], [place]  [address] Phone:  [phone] Fax:  [phone]

## 2022-07-12 ENCOUNTER — Other Ambulatory Visit: Payer: Self-pay | Admitting: Neurology

## 2022-07-12 DIAGNOSIS — G8114 Spastic hemiplegia affecting left nondominant side: Secondary | ICD-10-CM

## 2022-07-12 DIAGNOSIS — G35 Multiple sclerosis: Secondary | ICD-10-CM

## 2022-07-15 ENCOUNTER — Other Ambulatory Visit: Payer: Self-pay | Admitting: Neurology

## 2022-07-18 ENCOUNTER — Telehealth: Payer: Self-pay | Admitting: Physical Therapy

## 2022-07-18 ENCOUNTER — Ambulatory Visit: Payer: Medicare (Managed Care) | Admitting: Physical Therapy

## 2022-07-18 NOTE — Telephone Encounter (Signed)
Called patient and LVM pertaining to No Show at today's appointment at 2:00pm on 07/18/2022. Reminded her of her upcoming appointment next Tuesday as well as 3 cancellation no show policy.   Maryruth Eve, PT, DPT

## 2022-07-25 ENCOUNTER — Ambulatory Visit: Payer: Medicare (Managed Care) | Admitting: Physical Therapy

## 2022-07-27 ENCOUNTER — Other Ambulatory Visit: Payer: Self-pay | Admitting: *Deleted

## 2022-07-27 DIAGNOSIS — G35 Multiple sclerosis: Secondary | ICD-10-CM

## 2022-07-31 ENCOUNTER — Ambulatory Visit: Payer: Medicare (Managed Care) | Attending: Neurology | Admitting: Physical Therapy

## 2022-07-31 ENCOUNTER — Telehealth: Payer: Self-pay | Admitting: Physical Therapy

## 2022-07-31 ENCOUNTER — Encounter: Payer: Medicare (Managed Care) | Admitting: Occupational Therapy

## 2022-07-31 DIAGNOSIS — M25642 Stiffness of left hand, not elsewhere classified: Secondary | ICD-10-CM | POA: Insufficient documentation

## 2022-07-31 DIAGNOSIS — M25622 Stiffness of left elbow, not elsewhere classified: Secondary | ICD-10-CM | POA: Insufficient documentation

## 2022-07-31 DIAGNOSIS — R278 Other lack of coordination: Secondary | ICD-10-CM | POA: Insufficient documentation

## 2022-07-31 DIAGNOSIS — R269 Unspecified abnormalities of gait and mobility: Secondary | ICD-10-CM | POA: Insufficient documentation

## 2022-07-31 DIAGNOSIS — M6281 Muscle weakness (generalized): Secondary | ICD-10-CM | POA: Insufficient documentation

## 2022-07-31 NOTE — Telephone Encounter (Signed)
Called patient and LVM; reminder of 3-visit no-show policy. This is 2/3 no shows and 1 cancel since initial eval. Reminded of upcoming Occupational Eval on 08/07/2022 and physical therapy visit at 2:00 and 2:45 pm.   Malachi Carl, PT, DPT

## 2022-08-03 ENCOUNTER — Non-Acute Institutional Stay (HOSPITAL_COMMUNITY)
Admission: RE | Admit: 2022-08-03 | Discharge: 2022-08-03 | Disposition: A | Discharge status: Home / Self Care | Payer: Medicare (Managed Care) | Source: Ambulatory Visit | Attending: Internal Medicine | Admitting: Internal Medicine

## 2022-08-03 DIAGNOSIS — G35 Multiple sclerosis: Secondary | ICD-10-CM

## 2022-08-03 MED ORDER — SODIUM CHLORIDE 0.9 % IV SOLN: INTRAVENOUS | Status: DC | PRN

## 2022-08-03 MED ORDER — FAMOTIDINE IN NACL 20-0.9 MG/50ML-% IV SOLN
20.0000 mg | Freq: Once | INTRAVENOUS | Status: AC
  Administered 2022-08-03: 20 mg via INTRAVENOUS
  Filled 2022-08-03: qty 50

## 2022-08-03 MED ORDER — DIPHENHYDRAMINE HCL 50 MG/ML IJ SOLN
50.0000 mg | Freq: Once | INTRAMUSCULAR | Status: AC
  Administered 2022-08-03: 50 mg via INTRAVENOUS
  Filled 2022-08-03: qty 1

## 2022-08-03 MED ORDER — ACETAMINOPHEN 325 MG PO TABS
650.0000 mg | ORAL_TABLET | Freq: Once | ORAL | Status: AC
  Administered 2022-08-03: 650 mg via ORAL
  Filled 2022-08-03: qty 2

## 2022-08-03 MED ORDER — SODIUM CHLORIDE 0.9 % IV SOLN
600.0000 mg | Freq: Once | INTRAVENOUS | Status: AC
  Administered 2022-08-03: 600 mg via INTRAVENOUS
  Filled 2022-08-03: qty 20

## 2022-08-03 MED ORDER — METHYLPREDNISOLONE SODIUM SUCC 125 MG IJ SOLR
125.0000 mg | Freq: Once | INTRAMUSCULAR | Status: AC
  Administered 2022-08-03: 125 mg via INTRAVENOUS
  Filled 2022-08-03: qty 2

## 2022-08-03 NOTE — Progress Notes (Signed)
PATIENT CARE CENTER NOTE    Diagnosis: Multiple sclerosis ([place]) [G35]    Provider: Arlice Colt, MD   Procedure: De Nurse infusion    Note: Patient received Ocrevus 600 mg infusion via PIV. Had difficulty obtaining IV access. Access obtained by IV team. Pre-medications (Tylenol, IV Benadryl, IV Solu-medrol, Pepcid IVPB) per order. Infusion titrated per protocol. Patient tolerated well with no adverse reaction. Patient declined to wait for the 1 hour post infusion observation. Discharge instructions given. Patient to come back in 6 months and will schedule next appointment at the front desk. Vital signs stable. Patient alert, oriented and transfers in personal motorized wheel chair at discharge.  Patient discharged home with family member.

## 2022-08-07 ENCOUNTER — Ambulatory Visit: Payer: Medicare (Managed Care) | Admitting: Occupational Therapy

## 2022-08-07 ENCOUNTER — Ambulatory Visit: Payer: Medicare (Managed Care) | Admitting: Physical Therapy

## 2022-08-07 NOTE — Therapy (Incomplete)
OUTPATIENT OCCUPATIONAL THERAPY NEURO EVALUATION  Patient Name: Lindsay Schneider MRN: [id number] DOB:09/19/1985, 37 y.o., female Today's Date: 08/07/2022  PCP: *** REFERRING PROVIDER: Britt Bottom, MD  END OF SESSION:   Past Medical History:  Diagnosis Date   Bilateral leg weakness 04/27/2015   Left foot drop 02/08/2017   Movement disorder    MS (multiple sclerosis) (HCC)    Spastic hemiplegia affecting left nondominant side ([place]) 12/27/2016   Past Surgical History:  Procedure Laterality Date   CESAREAN SECTION     Patient Active Problem List   Diagnosis Date Noted   High risk medication use 02/02/2022   Wheelchair dependence 02/02/2022   Acute asthma exacerbation 01/12/2022   Asthma exacerbation 11/25/2021   Tachycardia 11/25/2021   Pedal edema 11/25/2021   HTN (hypertension) 11/25/2021   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  Rationale for Evaluation and Treatment: Rehabilitation  SUBJECTIVE:   SUBJECTIVE STATEMENT: *** Pt accompanied by: {accompnied:27141}  PERTINENT HISTORY: ***  PRECAUTIONS: Fall  WEIGHT BEARING RESTRICTIONS: {Yes ***/No:24003}  PAIN:  Are you having pain? {OPRCPAIN:27236}  FALLS: Has patient fallen in last 6 months? No  LIVING ENVIRONMENT: Lives with: lives with their family Lives in: House/apartment Stairs:  has ramps Has following equipment at home: Wheelchair (power), Shower bench, and bed side commode    PLOF: Needs assistance with ADLs, Needs assistance with  homemaking, and Needs assistance with transfers  PATIENT GOALS: ***  OBJECTIVE:   HAND DOMINANCE: {MISC; OT HAND [phone]}  ADLs: Overall ADLs: *** Transfers/ambulation related to ADLs: Eating: *** Grooming: *** UB Dressing: *** LB Dressing: *** Toileting: *** Bathing: *** Tub Shower transfers: *** Equipment: {equipment:25573}  IADLs: Shopping: *** Light housekeeping: *** Meal Prep: *** Community mobility: *** Medication management: *** Financial management: *** Handwriting: {OTWRITTENEXPRESSION:25361}  MOBILITY STATUS: {OTMOBILITY:25360}  POSTURE COMMENTS:  {posture:25561} Sitting balance: {sitting balance:25483}  ACTIVITY TOLERANCE: Activity tolerance: ***  FUNCTIONAL OUTCOME MEASURES: {OTFUNCTIONALMEASURES:27238}  UPPER EXTREMITY ROM:    {AROM/PROM:27142} ROM Right eval Left eval  Shoulder flexion    Shoulder abduction    Shoulder adduction    Shoulder extension    Shoulder internal rotation    Shoulder external rotation    Elbow flexion    Elbow extension    Wrist flexion    Wrist extension    Wrist ulnar deviation    Wrist radial deviation    Wrist pronation    Wrist supination    (Blank rows = not tested)  UPPER EXTREMITY MMT:     MMT Right eval Left eval  Shoulder flexion    Shoulder abduction    Shoulder adduction    Shoulder extension    Shoulder internal rotation    Shoulder external rotation    Middle trapezius    Lower trapezius    Elbow flexion    Elbow extension    Wrist flexion    Wrist extension    Wrist ulnar deviation    Wrist radial deviation    Wrist pronation    Wrist supination    (Blank rows = not tested)  HAND FUNCTION: {handfunction:27230}  COORDINATION: {  otcoordination:27237}  SENSATION: {sensation:27233}  EDEMA: ***  MUSCLE TONE: {UETONE:25567}  COGNITION: Overall cognitive status: {cognition:24006}  VISION: Subjective report: *** Baseline vision:  {OTBASELINEVISION:25363} Visual history: {OTVISUALHISTORY:25364}  VISION ASSESSMENT: {visionassessment:27231}  Patient has difficulty with following activities due to following visual impairments: ***  PERCEPTION: {Perception:25564}  PRAXIS: {Praxis:25565}  OBSERVATIONS: ***   TODAY'S TREATMENT:                                                                                                                              DATE: ***   PATIENT EDUCATION: Education details: *** Person educated: {Person educated:25204} Education method: {Education Method:25205} Education comprehension: {Education Comprehension:25206}  HOME EXERCISE PROGRAM: ***   GOALS: Goals reviewed with patient? {yes/no:20286}  SHORT TERM GOALS: Target date: ***  *** Baseline: Goal status: {GOALSTATUS:25110}  2.  *** Baseline:  Goal status: {GOALSTATUS:25110}  3.  *** Baseline:  Goal status: {GOALSTATUS:25110}  4.  *** Baseline:  Goal status: {GOALSTATUS:25110}  5.  *** Baseline:  Goal status: {GOALSTATUS:25110}  6.  *** Baseline:  Goal status: {GOALSTATUS:25110}  LONG TERM GOALS: Target date: ***  *** Baseline:  Goal status: {GOALSTATUS:25110}  2.  *** Baseline:  Goal status: {GOALSTATUS:25110}  3.  *** Baseline:  Goal status: {GOALSTATUS:25110}  4.  *** Baseline:  Goal status: {GOALSTATUS:25110}  5.  *** Baseline:  Goal status: {GOALSTATUS:25110}  6.  *** Baseline:  Goal status: {GOALSTATUS:25110}  ASSESSMENT:  CLINICAL IMPRESSION: Patient is a *** y.o. *** who was seen today for occupational therapy evaluation for ***.   PERFORMANCE DEFICITS: in functional skills including {OT physical skills:25468}, cognitive skills including {OT cognitive skills:25469}, and psychosocial skills including {OT psychosocial skills:25470}.   IMPAIRMENTS: are limiting patient from {OT performance deficits:25471}.   CO-MORBIDITIES: {Comorbidities:25485} that affects occupational  performance. Patient will benefit from skilled OT to address above impairments and improve overall function.  MODIFICATION OR ASSISTANCE TO COMPLETE EVALUATION: {OT modification:25474}  OT OCCUPATIONAL PROFILE AND HISTORY: {OT PROFILE AND HISTORY:25484}  CLINICAL DECISION MAKING: {OT CDM:25475}  REHAB POTENTIAL: {rehabpotential:25112}  EVALUATION COMPLEXITY: {Evaluation complexity:25115}    PLAN:  OT FREQUENCY: {rehab frequency:25116}  OT DURATION: {rehab duration:25117}  PLANNED INTERVENTIONS: {OT Interventions:25467}  RECOMMENDED OTHER SERVICES: ***  CONSULTED AND AGREED WITH PLAN OF CARE: {QIW:97989}  PLAN FOR NEXT SESSION: ***   Hans Eden, OT 08/07/2022, 7:49 AM

## 2022-08-10 ENCOUNTER — Ambulatory Visit: Payer: Medicare Other | Admitting: Neurology

## 2022-08-21 ENCOUNTER — Encounter: Payer: Self-pay | Admitting: Occupational Therapy

## 2022-08-21 ENCOUNTER — Encounter: Payer: Self-pay | Admitting: Physical Therapy

## 2022-08-21 ENCOUNTER — Ambulatory Visit: Payer: Medicare (Managed Care) | Admitting: Occupational Therapy

## 2022-08-21 ENCOUNTER — Ambulatory Visit: Payer: Medicare (Managed Care) | Admitting: Physical Therapy

## 2022-08-21 VITALS — BP 125/91 | HR 90

## 2022-08-21 DIAGNOSIS — R278 Other lack of coordination: Secondary | ICD-10-CM | POA: Diagnosis present

## 2022-08-21 DIAGNOSIS — M6281 Muscle weakness (generalized): Secondary | ICD-10-CM

## 2022-08-21 DIAGNOSIS — M25642 Stiffness of left hand, not elsewhere classified: Secondary | ICD-10-CM | POA: Diagnosis present

## 2022-08-21 DIAGNOSIS — R269 Unspecified abnormalities of gait and mobility: Secondary | ICD-10-CM | POA: Diagnosis present

## 2022-08-21 DIAGNOSIS — M25622 Stiffness of left elbow, not elsewhere classified: Secondary | ICD-10-CM | POA: Diagnosis present

## 2022-08-21 NOTE — Therapy (Signed)
OUTPATIENT OCCUPATIONAL THERAPY NEURO EVALUATION  Patient Name: Lindsay Schneider MRN: [id number] DOB:[birth year], 37 y.o., female Today's Date: 08/21/2022  PCP: Durward Parcel REFERRING PROVIDER: Dr. Felecia Shelling  END OF SESSION:  OT End of Session - 08/21/22 1112     Visit Number 1    Number of Visits 7    Date for OT Re-Evaluation 10/09/22    Authorization Type [employer]    OT Start Time 1109    OT Stop Time 1145    OT Time Calculation (min) 36 min    Behavior During Therapy [facility] for tasks assessed/performed             Past Medical History:  Diagnosis Date   Bilateral leg weakness 04/27/2015   Left foot drop 02/08/2017   Movement disorder    MS (multiple sclerosis) ([place])    Spastic hemiplegia affecting left nondominant side ([place]) 12/27/2016   Past Surgical History:  Procedure Laterality Date   CESAREAN SECTION     Patient Active Problem List   Diagnosis Date Noted   High risk medication use 02/02/2022   Wheelchair dependence 02/02/2022   Acute asthma exacerbation 01/12/2022   Asthma exacerbation 11/25/2021   Tachycardia 11/25/2021   Pedal edema 11/25/2021   HTN (hypertension) 11/25/2021   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 ([place]) 12/27/2016   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   Gait disturbance 04/27/2015   Urinary disorder 04/27/2015   Right leg weakness 04/27/2015    ONSET DATE: 07/12/22  REFERRING DIAG: G81.14 (ICD-10-CM) - 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 ([place]) G35 (ICD-10-CM) - 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 PMH: tachycardia, HTN, asthma  PRECAUTIONS: Fall  WEIGHT BEARING RESTRICTIONS: No  PAIN:  Are you having pain? No  FALLS: Has patient fallen in last 6 months? No  LIVING ENVIRONMENT: Lives with: lives with an adult companion Lives in: House/apartment Stairs: No Has following equipment at home: Ramped entry  PLOF: Needs assistance with ADLs, Needs assistance with homemaking, Needs assistance with gait, and Needs assistance with transfers  PATIENT GOALS: to get arms stronger  OBJECTIVE:   HAND DOMINANCE: Right  ADLs: Overall ADLs: requires  Transfers/ambulation related to ADLs: Eating: feeds elf , needs assist with cutting food Grooming: set up assist brushing teeth UB Dressing: mod A with donning shirt per pt report LB Dressing: dependent for lower body dressing Toileting: total assist uses BSC Bathing: has tub bench, mod A UB, total assist for LB bathing Tub Shower transfers: total assist, has caregiver Equipment: Transfer tub bench  IADLs: Shopping: online hsopping Light housekeeping: dependent Meal Prep: dependent Community mobility: electric w/c Medication management: pt Medical laboratory scientific officer: pt handles online Handwriting:  80% legible  MOBILITY STATUS:  total assist for transfers, uses electric w/c   FUNCTIONAL OUTCOME MEASURES: Quick Dash: 59%  UPPER EXTREMITY ROM:  RUE 110* AROM shoulder flexion, decreased supination   Passive ROM Right  eval Left eval  Shoulder flexion  75  Shoulder abduction    Shoulder adduction    Shoulder extension    Shoulder internal rotation    Shoulder external rotation    Elbow flexion  Passive WFL, maintains 90* at rest  Elbow extension  -65   Wrist flexion    Wrist extension    Wrist ulnar deviation    Wrist radial deviation    Wrist pronation    Wrist supination    Finger flexion at rest for LUE, P/ROM 90% extension- no active movement in LUE except trace shoulder shrug  )  HAND FUNCTION: Grip strength: Right: 55.7 lbs; Left: unable   COORDINATION: 9 Hole Peg test: Right: 8 pegs in 2 mins 2 secs sec; Left: unable sec Box and Blocks:  Right 24blocks, Left unable  SENSATION: Light touch: Impaired right hand     MUSCLE TONE: RUE: Within functional limits and LUE: Moderate and Hypertonic maintains L hand fisted   COGNITION: Overall cognitive status: Within functional limits for tasks assessed  VISION: Subjective report: denies changes   VISION ASSESSMENT: Not tested      OBSERVATIONS: n/a   TODAY'S TREATMENT:                                                                                                                              DATE: n/a eval only   PATIENT EDUCATION: Education details: role of OT, potential OT goals Person educated: Patient Education method: Explanation Education comprehension: verbalized understanding  HOME EXERCISE PROGRAM: N/A   GOALS: Goals reviewed with patient? Yes  SHORT TERM GOALS: Target date: 09/20/22  I with HEP Baseline:dependent Goal status: INITIAL  2.  I with LUE splint wear care and precautions for improved positioning, for left hand and elbow Baseline: maintains hand fisted at rest and elbow in 90% flexion without active hand or elbow movement Goal status: INITIAL  3.  2. Pt will verbalize understanding of adapted strategies to maximize pt safety and I with ADLS/ IADLS (ie: rocker knide, built up grips) Baseline: dependent Goal status: INITIAL  4.  Pt will demonstrate improved RUE fine motor coordination as evidenced by placing 9 pegs in 2 mins or less. Baseline: RUE, 8 pegs placed in 2 mins 2 secs. Goal status: INITIAL    LONG TERM GOALS:  Target date: 10/09/22  Pt will write her name and address legibly using AE prn Baseline: 80% legible Goal status: INITIAL  2.   Pt will demonstrate improved RUE functional use as evidenced by increasing RUE box/ blocks score to 28 blocks or greater. Baseline: RUE 24 blocks Goal status: INITIAL   ASSESSMENT:  CLINICAL IMPRESSION: Patient is a 37 y.o. 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 Pt was diagnosed with MS in 2011, has an active/relapsing secondary progressive MS.  Pt can benefit from skilled occupational therapy to maximize pt's safety and I with ADLS/IADLS and to maintain quality of life.  PERFORMANCE DEFICITS: in functional skills including ADLs, IADLs, coordination, dexterity, sensation, tone, ROM, strength, flexibility, Fine motor control, Gross motor control, mobility, balance, endurance, decreased knowledge of precautions, decreased knowledge of use of DME, and UE functional use, cognitive skills including psychosocial skills including coping strategies, environmental adaptation, habits, interpersonal interactions, and routines and behaviors.   IMPAIRMENTS: are limiting patient from ADLs, IADLs, rest and sleep, play, leisure, and social participation.   CO-MORBIDITIES: may have co-morbidities  that affects occupational performance. Patient will benefit from skilled OT to address above impairments and improve overall function.  MODIFICATION OR ASSISTANCE TO COMPLETE EVALUATION: No modification of tasks or assist necessary to complete an evaluation.  OT OCCUPATIONAL PROFILE AND HISTORY: Detailed assessment: Review of records and additional review of physical, cognitive, psychosocial history related to current functional performance.  CLINICAL DECISION MAKING: LOW - limited treatment  options, no task modification necessary  REHAB POTENTIAL: Good  EVALUATION COMPLEXITY: Low    PLAN:  OT FREQUENCY: 1x/week plus eval  OT DURATION: 7 weeks  PLANNED INTERVENTIONS: self care/ADL training, therapeutic exercise, therapeutic activity, neuromuscular re-education, manual therapy, passive range of motion, balance training, stair training, splinting, paraffin, fluidotherapy, moist heat, cryotherapy, patient/family education, energy conservation, coping strategies training, and DME and/or AE instructions  RECOMMENDED OTHER SERVICES: n/a  CONSULTED AND AGREED WITH PLAN OF CARE: Patient  PLAN FOR NEXT SESSION: Educate pt/ caregiver in stretching program for LUE,  splinting needs for LUE(possible bean bag splint elbow and resting hand) Check all possible CPT codes: 74259 - OT Re-evaluation, 97110- Therapeutic Exercise, [phone]- Neuro Re-education, 97140 - Manual Therapy, 97530 - Therapeutic Activities, 97535 - Self Care, 97014 - Electrical stimulation (unattended), 97035 - Ultrasound, [id number] - Orthotic Fit, [id number] - Fluidotherapy, [id number] - Contrast bath, and [id number] -  Paraffin    Check all conditions that are expected to impact treatment: Neurological condition   If treatment provided at initial evaluation, no treatment charged due to lack of authorization.    Clem Wisenbaker, OT 08/21/2022, 1:59 PM

## 2022-08-21 NOTE — Therapy (Addendum)
OUTPATIENT PHYSICAL THERAPY NEURO TREATMENT   Patient Name: Lindsay Schneider MRN: [id number] DOB:[birth year], 37 y.o., female Today's Date: 08/22/2022   PCP: [facility] - comes to house for visits  REFERRING PROVIDER: Britt Bottom,  END OF SESSION:  PT End of Session - 08/21/22 1024     Visit Number 2    Number of Visits 8    Date for PT Re-Evaluation 10/16/22    Authorization Type Traditional Medicaid    Authorization Time Period Require reauthorization on visit 5 (current authorization used 0/3 as eval doesn't count and will eat 2nd visit cost as unaware of insurance patient had)    Authorization - Visit Number 5    Authorization - Number of Visits 3    Progress Note Due on Visit 10    PT Start Time 1016    PT Stop Time 1100    PT Time Calculation (min) 44 min    Equipment Utilized During Treatment Gait belt    Activity Tolerance Patient tolerated treatment well    Behavior During Therapy WFL for tasks assessed/performed             Past Medical History:  Diagnosis Date   Bilateral leg weakness 04/27/2015   Left foot drop 02/08/2017   Movement disorder    MS (multiple sclerosis) ([place])    Spastic hemiplegia affecting left nondominant side ([place]) 12/27/2016   Past Surgical History:  Procedure Laterality Date   CESAREAN SECTION     Patient Active Problem List   Diagnosis Date Noted   High risk medication use 02/02/2022   Wheelchair dependence 02/02/2022   Acute asthma exacerbation 01/12/2022   Asthma exacerbation 11/25/2021   Tachycardia 11/25/2021   Pedal edema 11/25/2021   HTN (hypertension) 11/25/2021   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 ([place]) 12/27/2016   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   Gait disturbance 04/27/2015   Urinary disorder 04/27/2015   Right leg weakness 04/27/2015    ONSET DATE: 06/09/2022  REFERRING DIAG: G35 (ICD-10-CM) - Multiple sclerosis (HCC) R26.9  (ICD-10-CM) - Gait disturbance  THERAPY DIAG:  Abnormality of gait and mobility - Plan: PT plan of care cert/re-cert  Muscle weakness (generalized) - Plan: PT plan of care cert/re-cert  Rationale for Evaluation and Treatment: Rehabilitation  SUBJECTIVE:                                                                                                                                                                                             SUBJECTIVE STATEMENT: Patient reports no changes to medication/falls. She reports she last stood 3 years  ago but would like to try standing if possible. Patient also expresses interest in LE bracing if possible. Patient reports that she is still sleeping in her bed. Her boyfriend continues to [facility] for transfers. Patient reports that she thinks she got her chair about 1 year ago.  Pt accompanied by: significant other - boyfriend Alycia Rossetti)  PERTINENT HISTORY:   Per Note on 02/02/2022 "MS History:   In 2011, onset of left arm and leg numbness and weakness.  Her symptoms develop over 1 day. She had an MRI performed with lesions consistent with MS. She started to see Dr. Terrace Arabia here at [facility]. She was placed on Rebif. However, she stopped because her legs felt weaker when she was on it. She then transferred her care to Dr. Renne Crigler at [facility].     She was started on Tysabri.  She had several infusions of Tysabri but transportation was difficult to get to [facility]. Therefore, Dr. Renne Crigler switched her to Rituxan although she tolerated the medication well. She had a significant relapse with bilateral leg weakness within a couple weeks of the infusion. Therefore, 6 months later, she started Tysabri again. Due to difficulties with transportation, she transferred care to [place] in October 2016.  She converted to JCV Ab positive early 2021 She switched to Ocrevus."     PAIN:  Are you having pain? No  PRECAUTIONS: Fall  WEIGHT BEARING RESTRICTIONS: No  FALLS: Has patient fallen in  last 6 months? No  PATIENT GOALS: "I would like to stand and stretch my arm."  OBJECTIVE:   DIAGNOSTIC FINDINGS:   Per note on 02/02/2022 "MRI of the brain, cervical spine 01/13/2022 showed an unchanged distribution of white matter lesions consistent with MS.  There were no enhancing lesions.  MRI of the thoracic spine showed no demyelinating plaques.  No significant degenerative changes.  No spinal stenosis or nerve root compression.   MRI of the lumbar spine 01/13/2022 was unremarkable.   MRI of the brain 04/20/2021 showed T2/FLAIR hyperintense foci in the hemispheres, brainstem and cerebellum in a pattern and configuration consistent with chronic demyelinating plaque associated with multiple sclerosis.  None of the foci appear to be acute.  Compared to the MRI dated 06/23/2015, there do not appear to be any definite new lesions.  Generalized cortical atrophy that has progressed compared to the 2016 MRI   MRI of the cervical spine 04/20/2021 showed  Multiple discrete and confluent foci within the spinal cord and additional foci noted within the brainstem and cerebellum.  These are consistent with chronic demyelinating plaque associated with multiple sclerosis.  There is no definite change compared to the MRI dated 06/23/2015.   At C4-C5, there are disc osteophyte complexes causing borderline spinal stenosis and mild foraminal narrowing but no nerve root compression.  This has progressed compared to the 2016 MRI.   MRI of the brain 06/25/2015 shows a couple small T2/FLAIR hyperintense foci in the cerebellar hemispheres and other foci in the left middle cerebellar peduncle and pons..   The deep gray matter appears normal.  In the hemispheres, there are are multiple single and confluent T2/FLAIR hyperintense foci, predominantly in the periventricular white matter. Many of these foci are radially oriented to the ventricles.    MRI of the cervical spine 06/23/2015 shows multiple T2 hyperintense foci  within the spinal cord. They are located posteriorly to the left adjacent to C2, posteriorly adjacent to C3, posterior to the right adjacent to C4, posterior to the right adjacent to C5,  posterior to the right and central adjacent to C6.   None of these foci enhanced after contrast administration."   PATIENT SURVEYS:  Multiple Sclerosis Impact Scale (MSIS-29): 125/145 or 86% impairment   TODAY'S TREATMENT:                                                                                                                               There Act: Transferred max A x 2 to R side (squat-pivot transfer) with armrest and R hip guide removed (put foot plates up and used elevating mat to help align PWC to surface); patient has very poor ankle stability and so unable to assist well through LE and required careful LE alignment for safety  Sitting EOM with alt mod-SBA (one therapist guarding front and one guarding back); placed 4" stool under bilateral LE and required max A for LE placement; significant RLE clonus required repeated assistance to resolve  Lateral Elbow prop on R side for L trunk flexion stretch 3 x 60" (required min-modA to return to upright, CGA for hold)  Forward shoulder protraction/retraction into resistance with chin tuck and neck extension/rotation for gentle stretch/stability in forearm prop position 3 x 8 (CGA-minA for stability)  Forward/backward/lateral reaching with head hips relationship education 2 x 4 each direction  Tolerated ~30 minutes EOM sitting with alt UE and unsupported sitting with mod-SBA from therapist  Transferred max A x 2 to L side (squat-pivot transfer) from mat to [facility] with armrest and R hip guide removed (put foot plates up and used elevating mat to help align WC to surface); max A for LE management  PATIENT EDUCATION: Education details: Educated on BP safety, initial HEP, recommend contact [employer] about PWC modification (see assessment for details) Person  educated: Patient and boyfriend Education method: Explanation Education comprehension: verbalized understanding and needs further education  HOME EXERCISE PROGRAM: From power chair, right lateral flexion in elbow prop with caregiver present (can set up armrest/align to bed for max stretch)  GOALS: Goals reviewed with patient? Yes  SHORT TERM GOALS: Target date: 09/11/2022  Patient will demonstrate 100% compliance with initial HEP with assistance of caregiver to continue to progress between physical therapy sessions.   Baseline: To be provided Goal status: INITIAL  2. Refer patient to orthotist to assess if patient is appropriate candidate for LE bracing to ensure proper LE stability needed to improve safety with transfers.  Baseline: To be assessed; patient presents with ankle supination and hip abduction Goal status: INITIAL  3.  Patient will perform rolling in B directions with mod A to improve functional mobility. Baseline: Max A Goal status: INITIAL  4.  Patient will sit EOB with max A x 1 in preparation for transfers to improve mobility. Baseline: total Goal status: INITIAL  5.  Patient will reduce score on MSIS-29 by 6% to demonstrate progress towards reduced impairment related to MS. Baseline: 86% Goal status: INITIAL   LONG TERM GOALS: Target date:  10/02/2022  Patient will report demonstrate independence with final HEP with assistance of caregiver in order to maintain current gains and continue to progress after physical therapy discharge.   Baseline: To be provided Goal status: INITIAL  2. Patient will demonstrate tolerance for standing in standing walker x 60" with max A from therapist for setup to progress tolerance for standing activity.  Baseline: Unable to attempt Goal status: INITIAL  3.  Patient will perform rolling in B directions with min A to improve functional mobility. Baseline: Max A Goal status: INITIAL  4.  Patient will sit EOB with mod A x 1 in  preparation for transfers to improve mobility. Baseline: total Goal status: INITIAL  5.  Patient will reduce score on MSIS-29 by 20% to demonstrate progress towards reduced impairment related to MS. Baseline: 86% Goal status: INITIAL    ASSESSMENT:  CLINICAL IMPRESSION: Session is a re-certification as patient has only been able to attend x1 visit since initial eval. Collaborated on how to improve patient's ability to making it to physical therapy sessions and educated on importance of attending sessions with cancellation/no show policy in mind. Session emphasized trunk/core stability with EOB sitting. Patient was able to maintain EOM sitting with SBA-modA x 2 therapist. Patient was maxA x 2 to transfer to mat today. Patient encouraged to contact [employer] to adjust safety belt for better fit as well as for possible cushion as patient arrived to session seated on pillow in PWC and safety belt is nearly too tight to buckle. Patient verbalized understanding. Patient benefited from use of R elbow prop to allow for L lateral flexion as patient tends ot lean strongly to L at baseline. Patient will benefit from skilled physical therapy to address these impairments and improve modified independence.  OBJECTIVE IMPAIRMENTS: decreased balance, decreased mobility, difficulty walking, decreased ROM, decreased strength, increased edema, impaired tone, and impaired UE functional use.   ACTIVITY LIMITATIONS: standing, transfers, and bed mobility  PARTICIPATION LIMITATIONS: interpersonal relationship and community activity  PERSONAL FACTORS: Past/current experiences and Time since onset of injury/illness/exacerbation are also affecting patient's functional outcome.   REHAB POTENTIAL: Good  CLINICAL DECISION MAKING: Evolving/moderate complexity  EVALUATION COMPLEXITY: Moderate  PLAN:  PT FREQUENCY: 1x/week  PT DURATION: 8 weeks   PLANNED INTERVENTIONS: Therapeutic exercises, Therapeutic activity,  Neuromuscular re-education, Balance training, Gait training, Patient/Family education, Self Care, Joint mobilization, Orthotic/Fit training, and Wheelchair mobility training  PLAN FOR NEXT SESSION: Bed mobility, transfers to and from bed, standing frame as appropriate with LE alignment, progress initial HEP  Malachi Carl, PT, DPT  08/22/2022, 10:33 AM  Check all possible CPT codes: [phone] - PT Re-evaluation, 97110- Therapeutic Exercise, [phone]- Neuro Re-education, [phone] - Gait Training, [phone] - Manual Therapy, 97530 - Therapeutic Activities, and 97535 - Self Care    Check all conditions that are expected to impact treatment: Neurological condition and Social determinants of health   If treatment provided at initial evaluation, no treatment charged due to lack of authorization.

## 2022-08-24 DIAGNOSIS — R600 Localized edema: Secondary | ICD-10-CM | POA: Insufficient documentation

## 2022-08-29 ENCOUNTER — Ambulatory Visit: Payer: Medicare (Managed Care) | Admitting: Physical Therapy

## 2022-08-29 ENCOUNTER — Ambulatory Visit: Payer: Medicare (Managed Care) | Admitting: Occupational Therapy

## 2022-09-01 ENCOUNTER — Encounter: Payer: Self-pay | Admitting: Physical Therapy

## 2022-09-01 ENCOUNTER — Ambulatory Visit: Payer: Medicare (Managed Care) | Attending: Neurology | Admitting: Physical Therapy

## 2022-09-01 VITALS — BP 116/89

## 2022-09-01 DIAGNOSIS — M25642 Stiffness of left hand, not elsewhere classified: Secondary | ICD-10-CM

## 2022-09-01 DIAGNOSIS — M6281 Muscle weakness (generalized): Secondary | ICD-10-CM | POA: Diagnosis present

## 2022-09-01 DIAGNOSIS — R269 Unspecified abnormalities of gait and mobility: Secondary | ICD-10-CM | POA: Diagnosis present

## 2022-09-01 DIAGNOSIS — R278 Other lack of coordination: Secondary | ICD-10-CM

## 2022-09-01 DIAGNOSIS — M25622 Stiffness of left elbow, not elsewhere classified: Secondary | ICD-10-CM | POA: Diagnosis present

## 2022-09-01 NOTE — Therapy (Signed)
OUTPATIENT PHYSICAL THERAPY NEURO TREATMENT   Patient Name: Lindsay Schneider MRN: [id number] DOB:[birth year], 37 y.o., female Today's Date: 09/01/2022   PCP: [facility] - comes to house for visits  REFERRING PROVIDER: Britt Bottom,  END OF SESSION:  PT End of Session - 09/01/22 1016     Visit Number 3    Number of Visits 8    Date for PT Re-Evaluation 10/16/22    Authorization Type Traditional Medicaid    Authorization Time Period Require reauthorization on visit 5 (current authorization used 0/3 as eval doesn't count and will eat 2nd visit cost as unaware of insurance patient had)    Authorization - Visit Number 5    Authorization - Number of Visits 4    Progress Note Due on Visit 10    PT Start Time 1015    PT Stop Time 1100    PT Time Calculation (min) 45 min    Equipment Utilized During Treatment Gait belt    Activity Tolerance Patient tolerated treatment well    Behavior During Therapy WFL for tasks assessed/performed             Past Medical History:  Diagnosis Date   Bilateral leg weakness 04/27/2015   Left foot drop 02/08/2017   Movement disorder    MS (multiple sclerosis) ([place])    Spastic hemiplegia affecting left nondominant side ([place]) 12/27/2016   Past Surgical History:  Procedure Laterality Date   CESAREAN SECTION     Patient Active Problem List   Diagnosis Date Noted   High risk medication use 02/02/2022   Wheelchair dependence 02/02/2022   Acute asthma exacerbation 01/12/2022   Asthma exacerbation 11/25/2021   Tachycardia 11/25/2021   Pedal edema 11/25/2021   HTN (hypertension) 11/25/2021   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 ([place]) 12/27/2016   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   Gait disturbance 04/27/2015   Urinary disorder 04/27/2015   Right leg weakness 04/27/2015    ONSET DATE: 06/09/2022  REFERRING DIAG: G35 (ICD-10-CM) - Multiple sclerosis (HCC) R26.9  (ICD-10-CM) - Gait disturbance  THERAPY DIAG:  Muscle weakness (generalized)  Other lack of coordination  Stiffness of left elbow, not elsewhere classified  Abnormality of gait and mobility  Stiffness of left hand, not elsewhere classified  Rationale for Evaluation and Treatment: Rehabilitation  SUBJECTIVE:                                                                                                                                                                                             SUBJECTIVE STATEMENT: Patient reports she is doing well.  Reports no changes to medications/no falls/near falls.   Pt accompanied by: significant other - boyfriend Thurmond Butts)  PERTINENT HISTORY:   Per Note on 02/02/2022  "MS History:   In 2011, onset of left arm and leg numbness and weakness.  Her symptoms develop over 1 day. She had an MRI performed with lesions consistent with MS. She started to see Dr. Krista Blue here at [facility]. She was placed on Rebif. However, she stopped because her legs felt weaker when she was on it. She then transferred her care to Dr. Shelia Media at [facility]     She was started on Tysabri.  She had several infusions of Tysabri but transportation was difficult to get to [facility]. Therefore, Dr. Shelia Media switched her to Rituxan although she tolerated the medication well. She had a significant relapse with bilateral leg weakness within a couple weeks of the infusion. Therefore, 6 months later, she started Tysabri again. Due to difficulties with transportation, she transferred care to [place] in October 2016.  She converted to JCV Ab positive early 2021 She switched to [place]."     PAIN:  Are you having pain? No  PRECAUTIONS: Fall  WEIGHT BEARING RESTRICTIONS: No  FALLS: Has patient fallen in last 6 months? No  PATIENT GOALS: "I would like to stand and stretch my arm."  OBJECTIVE:   DIAGNOSTIC FINDINGS:   Per note on 02/02/2022 "MRI of the brain, cervical spine 01/13/2022 showed an  unchanged distribution of white matter lesions consistent with MS.  There were no enhancing lesions.  MRI of the thoracic spine showed no demyelinating plaques.  No significant degenerative changes.  No spinal stenosis or nerve root compression.   MRI of the lumbar spine 01/13/2022 was unremarkable.   MRI of the brain 04/20/2021 showed T2/FLAIR hyperintense foci in the hemispheres, brainstem and cerebellum in a pattern and configuration consistent with chronic demyelinating plaque associated with multiple sclerosis.  None of the foci appear to be acute.  Compared to the MRI dated 06/23/2015, there do not appear to be any definite new lesions.  Generalized cortical atrophy that has progressed compared to the 2016 MRI   MRI of the cervical spine 04/20/2021 showed  Multiple discrete and confluent foci within the spinal cord and additional foci noted within the brainstem and cerebellum.  These are consistent with chronic demyelinating plaque associated with multiple sclerosis.  There is no definite change compared to the MRI dated 06/23/2015.   At C4-C5, there are disc osteophyte complexes causing borderline spinal stenosis and mild foraminal narrowing but no nerve root compression.  This has progressed compared to the 2016 MRI.   MRI of the brain 06/25/2015 shows a couple small T2/FLAIR hyperintense foci in the cerebellar hemispheres and other foci in the left middle cerebellar peduncle and pons..   The deep gray matter appears normal.  In the hemispheres, there are are multiple single and confluent T2/FLAIR hyperintense foci, predominantly in the periventricular white matter. Many of these foci are radially oriented to the ventricles.    MRI of the cervical spine 06/23/2015 shows multiple T2 hyperintense foci within the spinal cord. They are located posteriorly to the left adjacent to C2, posteriorly adjacent to C3, posterior to the right adjacent to C4, posterior to the right adjacent to C5, posterior to the  right and central adjacent to C6.   None of these foci enhanced after contrast administration."   TODAY'S TREATMENT:  Therex: Upper trap stretch 3 x 30" bil (seated EOM with RLE down to increase stretch) Levator scap stretch 3 x 30"bil (seated EOM with RLE down to increase stretch) Scap squeezes 2 x 8 (seated EOM with min support)  There Act: Transferred max A x 2 to R side (squat-pivot transfer) with armrest and R hip guide removed (put foot plates up and placed 6" step under LE/ used elevating mat to help align PWC to surface); patient has very poor ankle stability and so unable to assist well through LE and required careful LE alignment for safety  Sitting EOM with alt mod-SBA (one therapist guarding front and one guarding back); placed 6" stool under bilateral LE and required max A for LE placement; significant RLE clonus required repeated assistance to resolve. Tolerated ~30 minutes of EOM sitting in today's session with mix of mod - SBA.   Lateral Elbow prop on R side for L trunk flexion stretch 4 x 60" (required CGA-modA to return to upright, CGA-SBA for hold)  Lateral lean to L side for R trunk flexion stretch 2 x 3 (required CGA-minA to return to upright)  Forward shoulder protraction/retraction  with chin tuck and neck extension/rotation for gentle stretch/stability in forearm prop position 2 x 5 (CGA-minA for stability)  Backwards trunk lean through active controlled ROM with forward crunch for abdominal strengthening 4 x 3 (min-SBA)  Transferred max A x 2 to L side (squat-pivot transfer) from mat to [facility] with armrest and R hip guide removed (put foot plates up and used elevating mat to help align WC to surface with 6" step under LE); max A for LE management and required second realignment back to chair; patient demonstrated improved use of head hips  relationship to help with transfer  Therapist discusses with patient concern about trying standing walker without foot bracing due to ankle instability as well as up to date bone scan given fact patient hasn't stood in 3 years. Advised holding on this goal until follow up with PCP with request about possible bone density scan/orthotic bracing. Patient verbalizes understanding and states she would still like to work on seated core stability to improve safety with transfers.   PATIENT EDUCATION: Education details: Continue HEP Person educated: Patient and boyfriend Education method: Explanation Education comprehension: verbalized understanding and needs further education  HOME EXERCISE PROGRAM: From power chair, right lateral flexion in elbow prop with caregiver present (can set up armrest/align to bed for max stretch)  Access Code: [email or web address] URL: [email or web address] Date: 09/01/2022 Prepared by: Malachi Carl  Exercises - Seated Scapular Retraction  - 1 x daily - 7 x weekly - 3 sets - 10 reps - Gentle Levator Scapulae Stretch  - 1 x daily - 7 x weekly - 3 sets - 10 reps - Seated Gentle Upper Trapezius Stretch  - 1 x daily - 7 x weekly - 3 sets - 10 reps  GOALS: Goals reviewed with patient? Yes  SHORT TERM GOALS: Target date: 09/11/2022  Patient will demonstrate 100% compliance with initial HEP with assistance of caregiver to continue to progress between physical therapy sessions.   Baseline: To be provided Goal status: INITIAL  2. Refer patient to orthotist to assess if patient is appropriate candidate for LE bracing to ensure proper LE stability needed to improve safety with transfers.  Baseline: To be assessed; patient presents with ankle supination and hip abduction Goal status: INITIAL  3.  Patient will perform rolling in B directions with mod A to improve functional  mobility. Baseline: Max A Goal status: INITIAL  4.  Patient will sit EOB with max A x 1 in  preparation for transfers to improve mobility. Baseline: total Goal status: INITIAL  5.  Patient will reduce score on MSIS-29 by 6% to demonstrate progress towards reduced impairment related to MS. Baseline: 86% Goal status: INITIAL   LONG TERM GOALS: Target date: 10/02/2022  Patient will report demonstrate independence with final HEP with assistance of caregiver in order to maintain current gains and continue to progress after physical therapy discharge.   Baseline: To be provided Goal status: INITIAL  2. Patient will demonstrate tolerance for standing in standing walker x 60" with max A from therapist for setup to progress tolerance for standing activity.  Baseline: Unable to attempt Goal status: INITIAL  3.  Patient will perform rolling in B directions with min A to improve functional mobility. Baseline: Max A Goal status: INITIAL  4.  Patient will sit EOB with mod A x 1 in preparation for transfers to improve mobility. Baseline: total Goal status: INITIAL  5.  Patient will reduce score on MSIS-29 by 20% to demonstrate progress towards reduced impairment related to MS. Baseline: 86% Goal status: INITIAL    ASSESSMENT:  CLINICAL IMPRESSION: Continued to work on EOM exercises to address core stability to assist with transfers and prolonged sitting in [place]. Patient was eager to participate during session and required mod-SBA for EOM activities and maxA x 2 for transfer. Patient also provided with HEP to improve seated posture to reduce forward head/rounded shoulders. Patient will benefit from skilled physical therapy to address these impairments and improve modified independence.  OBJECTIVE IMPAIRMENTS: decreased balance, decreased mobility, difficulty walking, decreased ROM, decreased strength, increased edema, impaired tone, and impaired UE functional use.   ACTIVITY LIMITATIONS: standing, transfers, and bed mobility  PARTICIPATION LIMITATIONS: interpersonal relationship  and community activity  PERSONAL FACTORS: Past/current experiences and Time since onset of injury/illness/exacerbation are also affecting patient's functional outcome.   REHAB POTENTIAL: Good  CLINICAL DECISION MAKING: Evolving/moderate complexity  EVALUATION COMPLEXITY: Moderate  PLAN:  PT FREQUENCY: 1x/week  PT DURATION: 8 weeks   PLANNED INTERVENTIONS: Therapeutic exercises, Therapeutic activity, Neuromuscular re-education, Balance training, Gait training, Patient/Family education, Self Care, Joint mobilization, Orthotic/Fit training, and Wheelchair mobility training  PLAN FOR NEXT SESSION: Bed mobility, transfers to and from bed, standing frame as appropriate with LE alignment, review HEP; follow up about bone scan/possible LE bracing  Malachi Carl, PT, DPT  09/01/2022, 11:42 AM  Check all possible CPT codes: [phone] - PT Re-evaluation, 97110- Therapeutic Exercise, [phone]- Neuro Re-education, [phone] - Gait Training, [phone] - Manual Therapy, 97530 - Therapeutic Activities, and [phone] - Self Care    Check all conditions that are expected to impact treatment: Neurological condition and Social determinants of health   If treatment provided at initial evaluation, no treatment charged due to lack of authorization.

## 2022-09-05 ENCOUNTER — Other Ambulatory Visit: Payer: Self-pay | Admitting: Neurology

## 2022-09-05 ENCOUNTER — Telehealth: Payer: Self-pay | Admitting: Physical Therapy

## 2022-09-05 DIAGNOSIS — Z7952 Long term (current) use of systemic steroids: Secondary | ICD-10-CM

## 2022-09-05 DIAGNOSIS — Z993 Dependence on wheelchair: Secondary | ICD-10-CM

## 2022-09-05 NOTE — Telephone Encounter (Signed)
Dr. Felecia Shelling, Alphonsa Overall was evaluated by myself on 07/11/2022.  The patient would benefit from a bone density scan. The patient has expressed interest in trialing a standing frame in PT; however, as she has not stood in 3 years, we wanted to ensure that it was safe to do so given her current bone integrity before trialing.   If you agree,in please place an order.  Thank you, Malachi Carl, PT, [place] [address] [place], [place]  [address] Phone:  [phone] Fax:  [phone]

## 2022-09-06 ENCOUNTER — Ambulatory Visit: Payer: Medicare (Managed Care) | Admitting: Physical Therapy

## 2022-09-06 ENCOUNTER — Ambulatory Visit: Payer: Medicare (Managed Care) | Admitting: Occupational Therapy

## 2022-09-06 NOTE — Therapy (Incomplete)
OUTPATIENT PHYSICAL THERAPY NEURO TREATMENT   Patient Name: Lindsay Schneider MRN: [id number] DOB:[birth year], 37 y.o., female Today's Date: 09/06/2022   PCP: [facility] - comes to house for visits  REFERRING PROVIDER: Britt Bottom,  END OF SESSION:    Past Medical History:  Diagnosis Date   Bilateral leg weakness 04/27/2015   Left foot drop 02/08/2017   Movement disorder    MS (multiple sclerosis) (HCC)    Spastic hemiplegia affecting left nondominant side ([place]) 12/27/2016   Past Surgical History:  Procedure Laterality Date   CESAREAN SECTION     Patient Active Problem List   Diagnosis Date Noted   High risk medication use 02/02/2022   Wheelchair dependence 02/02/2022   Acute asthma exacerbation 01/12/2022   Asthma exacerbation 11/25/2021   Tachycardia 11/25/2021   Pedal edema 11/25/2021   HTN (hypertension) 11/25/2021   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  Rationale for Evaluation and Treatment: Rehabilitation  SUBJECTIVE:                                                                                                                                                                                             SUBJECTIVE STATEMENT: Patient reports she is doing well. Reports no changes to medications/no falls/near falls.   Pt accompanied by: significant other - boyfriend Thurmond Butts)  PERTINENT HISTORY:   Per Note on 02/02/2022  "MS History:   In 2011, onset of left arm and leg numbness and weakness.  Her symptoms develop over 1 day. She had an MRI performed with lesions consistent with MS.  She started to see Dr. Krista Blue here at [facility]. She was placed on Rebif. However, she stopped because her legs felt weaker when she was on it. She then transferred her care to Dr. Shelia Media at [facility].     She was started on Tysabri.  She had several infusions of Tysabri but transportation was difficult to get to [facility]. Therefore, Dr. Shelia Media switched her to Rituxan although she tolerated the medication well. She had a significant relapse with bilateral leg weakness within a couple weeks of the infusion. Therefore, 6 months later, she started Tysabri again. Due to difficulties with transportation, she transferred care to [place] in October 2016.  She converted to JCV Ab positive early 2021 She  switched to [place]."     PAIN:  Are you having pain? No  PRECAUTIONS: Fall  WEIGHT BEARING RESTRICTIONS: No  FALLS: Has patient fallen in last 6 months? No  PATIENT GOALS: "I would like to stand and stretch my arm."  OBJECTIVE:   DIAGNOSTIC FINDINGS:   Per note on 02/02/2022 "MRI of the brain, cervical spine 01/13/2022 showed an unchanged distribution of white matter lesions consistent with MS.  There were no enhancing lesions.  MRI of the thoracic spine showed no demyelinating plaques.  No significant degenerative changes.  No spinal stenosis or nerve root compression.   MRI of the lumbar spine 01/13/2022 was unremarkable.   MRI of the brain 04/20/2021 showed T2/FLAIR hyperintense foci in the hemispheres, brainstem and cerebellum in a pattern and configuration consistent with chronic demyelinating plaque associated with multiple sclerosis.  None of the foci appear to be acute.  Compared to the MRI dated 06/23/2015, there do not appear to be any definite new lesions.  Generalized cortical atrophy that has progressed compared to the 2016 MRI   MRI of the cervical spine 04/20/2021 showed  Multiple discrete and confluent foci within the spinal cord and additional foci noted within the brainstem and cerebellum.  These are  consistent with chronic demyelinating plaque associated with multiple sclerosis.  There is no definite change compared to the MRI dated 06/23/2015.   At C4-C5, there are disc osteophyte complexes causing borderline spinal stenosis and mild foraminal narrowing but no nerve root compression.  This has progressed compared to the 2016 MRI.   MRI of the brain 06/25/2015 shows a couple small T2/FLAIR hyperintense foci in the cerebellar hemispheres and other foci in the left middle cerebellar peduncle and pons..   The deep gray matter appears normal.  In the hemispheres, there are are multiple single and confluent T2/FLAIR hyperintense foci, predominantly in the periventricular white matter. Many of these foci are radially oriented to the ventricles.    MRI of the cervical spine 06/23/2015 shows multiple T2 hyperintense foci within the spinal cord. They are located posteriorly to the left adjacent to C2, posteriorly adjacent to C3, posterior to the right adjacent to C4, posterior to the right adjacent to C5, posterior to the right and central adjacent to C6.   None of these foci enhanced after contrast administration."   TODAY'S TREATMENT:                                                                                                                               Therex: ***  There Act: Transferred max A x 2 to R side (squat-pivot transfer) with armrest and R hip guide removed (put foot plates up and placed 6" step under LE/ used elevating mat to help align PWC to surface); patient has very poor ankle stability and so unable to assist well through LE and required careful LE alignment for safety  Sitting EOM with alt mod-SBA (one therapist guarding front  and one guarding back); placed 6" stool under bilateral LE and required max A for LE placement; significant RLE clonus required repeated assistance to resolve. Tolerated ~30 minutes of EOM sitting in today's session with mix of mod - SBA.   Lateral Elbow  prop on R side for L trunk flexion stretch 4 x 60" (required CGA-modA to return to upright, CGA-SBA for hold)  Lateral lean to L side for R trunk flexion stretch 2 x 3 (required CGA-minA to return to upright)  Forward shoulder protraction/retraction  with chin tuck and neck extension/rotation for gentle stretch/stability in forearm prop position 2 x 5 (CGA-minA for stability)  Backwards trunk lean through active controlled ROM with forward crunch for abdominal strengthening 4 x 3 (min-SBA)  Transferred max A x 2 to L side (squat-pivot transfer) from mat to [facility] with armrest and R hip guide removed (put foot plates up and used elevating mat to help align WC to surface with 6" step under LE); max A for LE management and required second realignment back to chair; patient demonstrated improved use of head hips relationship to help with transfer  Therapist discusses with patient concern about trying standing walker without foot bracing due to ankle instability as well as up to date bone scan given fact patient hasn't stood in 3 years. Advised holding on this goal until follow up with PCP with request about possible bone density scan/orthotic bracing. Patient verbalizes understanding and states she would still like to work on seated core stability to improve safety with transfers.   PATIENT EDUCATION: Education details: Continue HEP*** Person educated: Patient and boyfriend Education method: Explanation Education comprehension: verbalized understanding and needs further education  HOME EXERCISE PROGRAM: From power chair, right lateral flexion in elbow prop with caregiver present (can set up armrest/align to bed for max stretch)  Access Code: [email or web address] URL: [email or web address] Date: 09/01/2022 Prepared by: Malachi Carl  Exercises - Seated Scapular Retraction  - 1 x daily - 7 x weekly - 3 sets - 10 reps - Gentle Levator Scapulae Stretch  - 1 x daily - 7 x weekly - 3 sets - 10  reps - Seated Gentle Upper Trapezius Stretch  - 1 x daily - 7 x weekly - 3 sets - 10 reps  GOALS: Goals reviewed with patient? Yes  SHORT TERM GOALS: Target date: 09/11/2022  Patient will demonstrate 100% compliance with initial HEP with assistance of caregiver to continue to progress between physical therapy sessions.   Baseline: To be provided Goal status: INITIAL  2. Refer patient to orthotist to assess if patient is appropriate candidate for LE bracing to ensure proper LE stability needed to improve safety with transfers.  Baseline: To be assessed; patient presents with ankle supination and hip abduction Goal status: INITIAL  3.  Patient will perform rolling in B directions with mod A to improve functional mobility. Baseline: Max A Goal status: INITIAL  4.  Patient will sit EOB with max A x 1 in preparation for transfers to improve mobility. Baseline: total Goal status: INITIAL  5.  Patient will reduce score on MSIS-29 by 6% to demonstrate progress towards reduced impairment related to MS. Baseline: 86% Goal status: INITIAL   LONG TERM GOALS: Target date: 10/02/2022  Patient will report demonstrate independence with final HEP with assistance of caregiver in order to maintain current gains and continue to progress after physical therapy discharge.   Baseline: To be provided Goal status: INITIAL  2. Patient will demonstrate tolerance for standing in  standing walker x 60" with max A from therapist for setup to progress tolerance for standing activity.  Baseline: Unable to attempt Goal status: INITIAL  3.  Patient will perform rolling in B directions with min A to improve functional mobility. Baseline: Max A Goal status: INITIAL  4.  Patient will sit EOB with mod A x 1 in preparation for transfers to improve mobility. Baseline: total Goal status: INITIAL  5.  Patient will reduce score on MSIS-29 by 20% to demonstrate progress towards reduced impairment related to  MS. Baseline: 86% Goal status: INITIAL    ASSESSMENT:  CLINICAL IMPRESSION: Emphasis of skilled PT session*** Continue POC.   OBJECTIVE IMPAIRMENTS: decreased balance, decreased mobility, difficulty walking, decreased ROM, decreased strength, increased edema, impaired tone, and impaired UE functional use.   ACTIVITY LIMITATIONS: standing, transfers, and bed mobility  PARTICIPATION LIMITATIONS: interpersonal relationship and community activity  PERSONAL FACTORS: Past/current experiences and Time since onset of injury/illness/exacerbation are also affecting patient's functional outcome.   REHAB POTENTIAL: Good  CLINICAL DECISION MAKING: Evolving/moderate complexity  EVALUATION COMPLEXITY: Moderate  PLAN:  PT FREQUENCY: 1x/week  PT DURATION: 8 weeks   PLANNED INTERVENTIONS: Therapeutic exercises, Therapeutic activity, Neuromuscular re-education, Balance training, Gait training, Patient/Family education, Self Care, Joint mobilization, Orthotic/Fit training, and Wheelchair mobility training  PLAN FOR NEXT SESSION: Bed mobility, transfers to and from bed, standing frame as appropriate with LE alignment, review HEP; follow up about bone scan/possible LE bracing***  Excell Seltzer, PT, DPT, CSRS   09/06/2022, 6:57 AM  Check all possible CPT codes: 97164 - PT Re-evaluation, 97110- Therapeutic Exercise, [phone]- Neuro Re-education, [phone] - Gait Training, [phone] - Manual Therapy, 97530 - Therapeutic Activities, and 97535 - [place] all conditions that are expected to impact treatment: Neurological condition and Social determinants of health   If treatment provided at initial evaluation, no treatment charged due to lack of authorization.

## 2022-09-11 ENCOUNTER — Encounter: Payer: Self-pay | Admitting: Physical Therapy

## 2022-09-11 ENCOUNTER — Ambulatory Visit: Payer: Medicare (Managed Care) | Admitting: Physical Therapy

## 2022-09-11 ENCOUNTER — Ambulatory Visit: Payer: Medicare (Managed Care) | Admitting: Occupational Therapy

## 2022-09-11 ENCOUNTER — Encounter: Payer: Self-pay | Admitting: Occupational Therapy

## 2022-09-11 VITALS — BP 144/84 | HR 94

## 2022-09-11 DIAGNOSIS — R269 Unspecified abnormalities of gait and mobility: Secondary | ICD-10-CM

## 2022-09-11 DIAGNOSIS — M6281 Muscle weakness (generalized): Secondary | ICD-10-CM | POA: Diagnosis not present

## 2022-09-11 DIAGNOSIS — M25642 Stiffness of left hand, not elsewhere classified: Secondary | ICD-10-CM

## 2022-09-11 DIAGNOSIS — R278 Other lack of coordination: Secondary | ICD-10-CM

## 2022-09-11 DIAGNOSIS — M25622 Stiffness of left elbow, not elsewhere classified: Secondary | ICD-10-CM

## 2022-09-11 NOTE — Patient Instructions (Addendum)
     Heat Home Program  Heat is used as part of your therapy for several reasons.  Heat increases blood flow, which promotes healing. Heat also relaxes muscles and joints which makes exercising easier.  It can be applied for __10-15_ minutes,  _up to 3_ sessions per day  Use one of the following methods that is most convenient for you  Heating pad: Follow instructions given by manufacturer.  Use on low-medium setting.    Moist heated towel: Soak towel in hot water or place damp towel in microwave and heat for 30 sec. Check temperature to determine if further time needed.  Wring out towel and wrap around affected area, cover with a plastic bag.  Can also wrap a dry towel around the plastic to help retain the heat.    Microwave gel pack: Follow instructions given by manufacturer.  Check temperature before application to ensure not hot enough to cause a burn    Rice sock: This can be made using a sock with no synthetic material and 1.5 cups of rice.  Pour the rice into the sock, tie a knot at the top.  Place in microwave for 1 minute, remove to check temperature to determine if further heating time is required prior to application  Be sure to check the skin periodically to ensure no excessive redness or blisters.  Use with caution in areas with decreased sensation 

## 2022-09-11 NOTE — Therapy (Signed)
OUTPATIENT PHYSICAL THERAPY NEURO TREATMENT   Patient Name: Lindsay Schneider MRN: [id number] DOB:1985-07-28, 37 y.o., female Today's Date: 09/12/2022   PCP: [facility] - comes to house for visits  REFERRING PROVIDER: Britt Bottom,  END OF SESSION:  PT End of Session - 09/11/22 1319     Visit Number 4    Number of Visits 8    Date for PT Re-Evaluation 10/16/22    Authorization Type Traditional Medicaid    Authorization Time Period Require reauthorization on visit 5 (current authorization used 0/3 as eval doesn't count and will eat 2nd visit cost as unaware of insurance patient had)    Authorization - Visit Number 5    Authorization - Number of Visits 4    Progress Note Due on Visit 10    PT Start Time 1317    PT Stop Time 1357    PT Time Calculation (min) 40 min    Equipment Utilized During Treatment Gait belt    Activity Tolerance Patient tolerated treatment well    Behavior During Therapy WFL for tasks assessed/performed             Past Medical History:  Diagnosis Date   Bilateral leg weakness 04/27/2015   Left foot drop 02/08/2017   Movement disorder    MS (multiple sclerosis) (HCC)    Spastic hemiplegia affecting left nondominant side ([place]) 12/27/2016   Past Surgical History:  Procedure Laterality Date   CESAREAN SECTION     Patient Active Problem List   Diagnosis Date Noted   High risk medication use 02/02/2022   Wheelchair dependence 02/02/2022   Acute asthma exacerbation 01/12/2022   Asthma exacerbation 11/25/2021   Tachycardia 11/25/2021   Pedal edema 11/25/2021   HTN (hypertension) 11/25/2021   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 ([place]) 12/27/2016   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   Gait disturbance 04/27/2015   Urinary disorder 04/27/2015   Right leg weakness 04/27/2015    ONSET DATE: 06/09/2022  REFERRING DIAG: G35 (ICD-10-CM) - Multiple sclerosis (HCC) R26.9  (ICD-10-CM) - Gait disturbance  THERAPY DIAG:  Muscle weakness (generalized)  Abnormality of gait and mobility  Rationale for Evaluation and Treatment: Rehabilitation  SUBJECTIVE:                                                                                                                                                                                             SUBJECTIVE STATEMENT: 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 Reports no changes to medications/no falls/near falls. She states she is wanting to do some stretches for LE and requests that  session is performed in wheel chair today. She also is interested in exploring braces for LE.   Pt accompanied by: significant other - boyfriend Thurmond Butts)  PERTINENT HISTORY:   Per Note on 02/02/2022  "MS History:   In 2011, onset of left arm and leg numbness and weakness.  Her symptoms develop over 1 day. She had an MRI performed with lesions consistent with MS. She started to see Dr. Krista Blue here at [facility]. She was placed on Rebif. However, she stopped because her legs felt weaker when she was on it. She then transferred her care to Dr. Shelia Media at [facility].     She was started on Tysabri.  She had several infusions of Tysabri but transportation was difficult to get to [facility]. Therefore, Dr. Shelia Media switched her to Rituxan although she tolerated the medication well. She had a significant relapse with bilateral leg weakness within a couple weeks of the infusion. Therefore, 6 months later, she started Tysabri again. Due to difficulties with transportation, she transferred care to [place] in October 2016.  She converted to JCV Ab positive early 2021 She switched to [place]."     PAIN:  Are you having pain? No  PRECAUTIONS: Fall  WEIGHT BEARING RESTRICTIONS: No  FALLS: Has patient fallen in last 6 months? No  PATIENT GOALS: "I would like to stand and stretch my arm."  OBJECTIVE:   DIAGNOSTIC FINDINGS:   Per note on 02/02/2022 "MRI of the brain, cervical  spine 01/13/2022 showed an unchanged distribution of white matter lesions consistent with MS.  There were no enhancing lesions.  MRI of the thoracic spine showed no demyelinating plaques.  No significant degenerative changes.  No spinal stenosis or nerve root compression.   MRI of the lumbar spine 01/13/2022 was unremarkable.   MRI of the brain 04/20/2021 showed T2/FLAIR hyperintense foci in the hemispheres, brainstem and cerebellum in a pattern and configuration consistent with chronic demyelinating plaque associated with multiple sclerosis.  None of the foci appear to be acute.  Compared to the MRI dated 06/23/2015, there do not appear to be any definite new lesions.  Generalized cortical atrophy that has progressed compared to the 2016 MRI   MRI of the cervical spine 04/20/2021 showed  Multiple discrete and confluent foci within the spinal cord and additional foci noted within the brainstem and cerebellum.  These are consistent with chronic demyelinating plaque associated with multiple sclerosis.  There is no definite change compared to the MRI dated 06/23/2015.   At C4-C5, there are disc osteophyte complexes causing borderline spinal stenosis and mild foraminal narrowing but no nerve root compression.  This has progressed compared to the 2016 MRI.   MRI of the brain 06/25/2015 shows a couple small T2/FLAIR hyperintense foci in the cerebellar hemispheres and other foci in the left middle cerebellar peduncle and pons..   The deep gray matter appears normal.  In the hemispheres, there are are multiple single and confluent T2/FLAIR hyperintense foci, predominantly in the periventricular white matter. Many of these foci are radially oriented to the ventricles.    MRI of the cervical spine 06/23/2015 shows multiple T2 hyperintense foci within the spinal cord. They are located posteriorly to the left adjacent to C2, posteriorly adjacent to C3, posterior to the right adjacent to C4, posterior to the right adjacent  to C5, posterior to the right and central adjacent to C6.   None of these foci enhanced after contrast administration."   TODAY'S TREATMENT:  Therex: Upper trap stretch 3 x 30" bil (seated EOM with RLE down to increase stretch) Levator scap stretch 3 x 30"bil (seated EOM with RLE down to increase stretch) Scap squeezes 2 x 8 (seated EOM with min support) Seated hamstring stretch with dorsiflexion and neutral ankle alignment passive stretch 3 x 60" bil IR/ER gentle ROM passive stretch 2 x 10" bil TherAct: Repeated above exercises and taught Thurmond Butts and had him practice how to provide overpressure and support for each. Ryan then practiced with patient consent. Educated patient on how to provide feedback on stretches and how to limit clonus and maximize proper body mechanics. Encouraged patient to direct care. Encouraged patient to contact wheelchair vendor for possible adjustments to chair to improve fit. Also encouraged pressure relief every few hours.  PATIENT EDUCATION: Education details: Continue HEP Person educated: Patient and boyfriend Education method: Explanation Education comprehension: verbalized understanding and needs further education  HOME EXERCISE PROGRAM: From power chair, right lateral flexion in elbow prop with caregiver present (can set up armrest/align to bed for max stretch)  Access Code: [email or web address] URL: [email or web address] Date: 09/11/2022 Prepared by: Malachi Carl  Exercises - Seated Scapular Retraction  - 1 x daily - 7 x weekly - 3 sets - 10 reps - Gentle Levator Scapulae Stretch  - 1 x daily - 7 x weekly - 3 sets - 10 reps - Seated Gentle Upper Trapezius Stretch  - 1 x daily - 7 x weekly - 3 sets - 10 reps - Passive Seated Hamstring Stretch with Chair  - 1 x daily - 7 x weekly - 3 sets - Passive Long Sitting Calf Stretch with  Strap  - 1 x daily - 7 x weekly - 3 sets - 60 hold  GOALS: Goals reviewed with patient? Yes  SHORT TERM GOALS: Target date: 09/11/2022  Patient will demonstrate 100% compliance with initial HEP with assistance of caregiver to continue to progress between physical therapy sessions.   Baseline: To be provided; demonstrates appropriate understanding during session with caregiver also demonstrating appropriate understanding/recommend reinforcement Goal status: IN PROGRESS  2. Refer patient to orthotist to assess if patient is appropriate candidate for LE bracing to ensure proper LE stability needed to improve safety with transfers.  Baseline: To be assessed; patient presents with ankle supination and hip abduction; ordered DAT scan to determine if bone integrity appropriate for bracing to attempt standing (order placed on 09/05/2022) Goal status: IN PROGRESS  3.  Patient will perform rolling in B directions with mod A to improve functional mobility. Baseline: Max A, continues to be Max A per caregiver report unable to assess during session given patient request Goal status: NOT MET  4.  Patient will sit EOB with max A x 1 in preparation for transfers to improve mobility. Baseline: total; SBA-modA pending on task and fatigue during last few sessions Goal status: MET  5.  Patient will reduce score on MSIS-29 by 6% to demonstrate progress towards reduced impairment related to MS. Baseline: 86%, To be assessed again next session Goal status: INITIAL   LONG TERM GOALS: Target date: 10/02/2022  Patient will report demonstrate independence with final HEP with assistance of caregiver in order to maintain current gains and continue to progress after physical therapy discharge.   Baseline: To be provided Goal status: INITIAL  2. Patient will demonstrate tolerance for standing in standing walker x 60" with max A from therapist for setup to progress tolerance for standing activity.  Baseline:  Unable to attempt Goal  status: INITIAL  3.  Patient will perform rolling in B directions with min A to improve functional mobility. Baseline: Max A Goal status: INITIAL  4.  Patient will sit EOB with mod A x 1 in preparation for transfers to improve mobility. Baseline: total Goal status: INITIAL  5.  Patient will reduce score on MSIS-29 by 20% to demonstrate progress towards reduced impairment related to MS. Baseline: 86% Goal status: INITIAL    ASSESSMENT:  CLINICAL IMPRESSION: Session emphasized assessment of short term goals and ROM and stretching to improve patient's comfort in chair. Patient achieved or progress well in 3/5 STG. Unable to assess 2 goals due to session time constraints and patient tolerance during session for activity. Patient will benefit from further care to continue to progress and prevent further decline. Will plan to assess in future sessions. Educated patient on how to direct care and repeated exercises with teach back method with Ryan to ensure comfort with helping patient with stretches at home. Patient will benefit from continued skilled physical therapy to address these impairments and improve modified independence.  OBJECTIVE IMPAIRMENTS: decreased balance, decreased mobility, difficulty walking, decreased ROM, decreased strength, increased edema, impaired tone, and impaired UE functional use.   ACTIVITY LIMITATIONS: standing, transfers, and bed mobility  PARTICIPATION LIMITATIONS: interpersonal relationship and community activity  PERSONAL FACTORS: Past/current experiences and Time since onset of injury/illness/exacerbation are also affecting patient's functional outcome.   REHAB POTENTIAL: Good  CLINICAL DECISION MAKING: Evolving/moderate complexity  EVALUATION COMPLEXITY: Moderate  PLAN:  PT FREQUENCY: 1x/week  PT DURATION: 8 weeks   PLANNED INTERVENTIONS: Therapeutic exercises, Therapeutic activity, Neuromuscular re-education, Balance  training, Gait training, Patient/Family education, Self Care, Joint mobilization, Orthotic/Fit training, and Wheelchair mobility training  PLAN FOR NEXT SESSION: Bed mobility, transfers to and from bed, standing frame as appropriate with LE alignment, review HEP; follow up about bone scan/possible LE bracing  Malachi Carl, PT, DPT  09/12/2022, 9:14 AM  Check all possible CPT codes: [phone] - PT Re-evaluation, 97110- Therapeutic Exercise, [phone]- Neuro Re-education, [phone] - Gait Training, [phone] - Manual Therapy, 97530 - Therapeutic Activities, and [phone] - [place] all conditions that are expected to impact treatment: Neurological condition and Social determinants of health   If treatment provided at initial evaluation, no treatment charged due to lack of authorization.

## 2022-09-11 NOTE — Therapy (Signed)
OUTPATIENT OCCUPATIONAL THERAPY NEURO TREATMENT  Patient Name: Lindsay Schneider MRN: [id number] DOB:06-26-1986, 37 y.o., female Today's Date: 09/11/2022  PCP: [facility] REFERRING PROVIDER: Dr. Felecia Shelling  END OF SESSION:  OT End of Session - 09/11/22 1324     Visit Number 2    Number of Visits 7    Date for OT Re-Evaluation 10/09/22    Authorization Type [employer]    Authorization Time Period 6 OT visits 08/29/22 - 10/09/22    Authorization - Visit Number 2    Authorization - Number of Visits 7    OT Start Time 1234    OT Stop Time 1314    OT Time Calculation (min) 40 min    Activity Tolerance Patient tolerated treatment well    Behavior During Therapy [facility] for tasks assessed/performed              Past Medical History:  Diagnosis Date   Bilateral leg weakness 04/27/2015   Left foot drop 02/08/2017   Movement disorder    MS (multiple sclerosis) (HCC)    Spastic hemiplegia affecting left nondominant side ([place]) 12/27/2016   Past Surgical History:  Procedure Laterality Date   CESAREAN SECTION     Patient Active Problem List   Diagnosis Date Noted   High risk medication use 02/02/2022   Wheelchair dependence 02/02/2022   Acute asthma exacerbation 01/12/2022   Asthma exacerbation 11/25/2021   Tachycardia 11/25/2021   Pedal edema 11/25/2021   HTN (hypertension) 11/25/2021   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 ([place]) 12/27/2016   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   Gait disturbance 04/27/2015   Urinary disorder 04/27/2015   Right leg weakness 04/27/2015    ONSET DATE: 07/12/22  REFERRING DIAG: G81.14 (ICD-10-CM) - 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  Pt accompanied by: friend  PERTINENT HISTORY:  Diagnosed with MS in 2011, has an active/relapsing secondary progressive MS. PMH: tachycardia, HTN, asthma  PRECAUTIONS: Fall  WEIGHT BEARING RESTRICTIONS: No  PAIN:  Are you having pain? No  FALLS: Has patient fallen in last 6 months? No  LIVING ENVIRONMENT: Lives with: lives with an adult companion Lives in: House/apartment Stairs: No Has following equipment at home: Ramped entry  PLOF: Needs assistance with ADLs, Needs assistance with homemaking, Needs assistance with gait, and Needs assistance with transfers  PATIENT GOALS: to get arms stronger  OBJECTIVE:   HAND DOMINANCE: Right  ADLs: Overall ADLs: requires  Transfers/ambulation related to ADLs: Eating: feeds elf , needs assist with cutting food Grooming: set up assist brushing teeth UB Dressing: mod A with donning shirt per pt report LB Dressing: dependent for lower body dressing Toileting: total assist uses BSC Bathing: has tub bench, mod A UB, total assist for LB bathing Tub Shower transfers: total assist, has caregiver Equipment: Transfer tub bench  IADLs: Shopping: online hsopping Light housekeeping: dependent Meal Prep: dependent Community mobility: electric w/c Medication management: pt Medical laboratory scientific officer: pt handles online Handwriting:  80% legible  MOBILITY STATUS:  total assist for transfers, uses electric w/c   FUNCTIONAL OUTCOME MEASURES: Quick Dash:  59%  UPPER EXTREMITY ROM:  RUE 110* AROM shoulder flexion, decreased supination   Passive ROM Right eval Left eval  Shoulder flexion  75  Shoulder abduction    Shoulder adduction    Shoulder extension    Shoulder internal rotation    Shoulder external rotation    Elbow flexion  Passive  WFL, maintains 90* at rest  Elbow extension  -65  Wrist flexion    Wrist extension    Wrist ulnar deviation    Wrist radial deviation    Wrist pronation    Wrist supination    Finger flexion at rest for LUE, P/ROM 90% extension- no active movement in LUE except trace shoulder shrug  )  HAND FUNCTION: Grip strength: Right: 55.7 lbs; Left: unable   COORDINATION: 9 Hole Peg test: Right: 8 pegs in 2 mins 2 secs sec; Left: unable sec Box and Blocks:  Right 24blocks, Left unable  SENSATION: Light touch: Impaired right hand     MUSCLE TONE: RUE: Within functional limits and LUE: Moderate and Hypertonic maintains L hand fisted   COGNITION: Overall cognitive status: Within functional limits for tasks assessed  VISION: Subjective report: denies changes   VISION ASSESSMENT: Not tested   OBSERVATIONS: n/a   TODAY'S TREATMENT:                                                                                                                              - Therapeutic exercises completed for duration as noted below including:  OT initiated LUE ROM HEP and use of heat as noted in pt instructions for improved ROM, improved pain, and spasticity management.   PATIENT EDUCATION: Education details: LUE HEP; Heat Person educated: Patient and Friend Education method: Explanation, Demonstration, and Handouts Education comprehension: verbalized understanding, returned demonstration, and needs further education  HOME EXERCISE PROGRAM: 2/19: LUE ROM   GOALS: Goals reviewed with patient? Yes  SHORT TERM GOALS: Target date: 09/20/22  I with HEP Baseline:dependent Goal status: IN PROGRESS  2.  I with LUE splint wear care and precautions for improved positioning, for left hand and elbow Baseline: maintains hand fisted at rest and elbow in 90% flexion without active hand or elbow movement Goal status: INITIAL  3.  2. Pt will verbalize understanding of adapted strategies to maximize pt  safety and I with ADLS/ IADLS (ie: rocker knide, built up grips) Baseline: dependent Goal status: INITIAL  4.  Pt will demonstrate improved RUE fine motor coordination as evidenced by placing 9 pegs in 2 mins or less. Baseline: RUE, 8 pegs placed in 2 mins 2 secs. Goal status: INITIAL    LONG TERM GOALS: Target date: 10/09/22  Pt will write her name and address legibly using AE prn Baseline: 80% legible Goal status: INITIAL  2.   Pt will demonstrate improved RUE functional use as evidenced by increasing RUE box/ blocks score to 28 blocks or greater. Baseline: RUE 24 blocks Goal status: INITIAL  ASSESSMENT:  CLINICAL IMPRESSION: Pt can benefit from skilled occupational therapy to maximize pt's safety and I with ADLS/IADLS and to maintain quality of life.  PERFORMANCE DEFICITS: in functional skills including ADLs, IADLs, coordination, dexterity, sensation, tone, ROM, strength, flexibility, Fine motor control, Gross motor control, mobility, balance, endurance, decreased knowledge of precautions, decreased knowledge of use of DME, and UE functional use, cognitive skills including psychosocial skills including coping strategies, environmental adaptation, habits, interpersonal interactions, and routines and behaviors.   IMPAIRMENTS: are limiting patient from ADLs, IADLs, rest and sleep, play, leisure, and social participation.   CO-MORBIDITIES: may have co-morbidities  that affects occupational performance. Patient will benefit from skilled OT to address above impairments and improve overall function.  REHAB POTENTIAL: Good  PLAN:  OT FREQUENCY: 1x/week plus eval  OT DURATION: 7 weeks  PLANNED INTERVENTIONS: self care/ADL training, therapeutic exercise, therapeutic activity, neuromuscular re-education, manual therapy, passive range of motion, balance training, stair training, splinting, paraffin, fluidotherapy, moist heat, cryotherapy, patient/family education, energy conservation,  coping strategies training, and DME and/or AE instructions  RECOMMENDED OTHER SERVICES: n/a  CONSULTED AND AGREED WITH PLAN OF CARE: Patient  PLAN FOR NEXT SESSION: Review pt/ caregiver in stretching program for LUE,  splinting needs for LUE(possible bean bag splint elbow and resting hand)  Check all possible CPT codes: [id number] - OT Re-evaluation, 97110- Therapeutic Exercise, [phone]- Neuro Re-education, 97140 - Manual Therapy, 97530 - Therapeutic Activities, [id number] - Self Care, 97014 - Electrical stimulation (unattended), 97035 - Ultrasound, [id number] - Orthotic Fit, [id number] - Fluidotherapy, [id number] - Contrast bath, and [id number] -  Paraffin    Check all conditions that are expected to impact treatment: Neurological condition   If treatment provided at initial evaluation, no treatment charged due to lack of authorization.    Dennis Bast, OT 09/11/2022, 5:37 PM

## 2022-09-20 ENCOUNTER — Encounter: Payer: Self-pay | Admitting: Occupational Therapy

## 2022-09-20 ENCOUNTER — Ambulatory Visit: Payer: Medicare (Managed Care) | Admitting: Physical Therapy

## 2022-09-20 ENCOUNTER — Ambulatory Visit: Payer: Medicare (Managed Care) | Admitting: Occupational Therapy

## 2022-09-20 DIAGNOSIS — M6281 Muscle weakness (generalized): Secondary | ICD-10-CM

## 2022-09-20 DIAGNOSIS — M25642 Stiffness of left hand, not elsewhere classified: Secondary | ICD-10-CM

## 2022-09-20 DIAGNOSIS — M25622 Stiffness of left elbow, not elsewhere classified: Secondary | ICD-10-CM

## 2022-09-20 DIAGNOSIS — R278 Other lack of coordination: Secondary | ICD-10-CM

## 2022-09-20 NOTE — Therapy (Signed)
OUTPATIENT PHYSICAL THERAPY NEURO TREATMENT   Patient Name: Lindsay Schneider MRN: [id number] DOB:06/24/1986, 37 y.o., female Today's Date: 09/20/2022   PCP: [facility] - comes to house for visits  REFERRING PROVIDER: Britt Bottom,  END OF SESSION:  PT End of Session - 09/20/22 1103     Visit Number 5    Number of Visits 8    Date for PT Re-Evaluation 10/16/22    Authorization Type Traditional Medicaid    Authorization Time Period approved 8 PT visits 09/20/22-11/14/22    Authorization - Visit Number 1    Authorization - Number of Visits 8    Progress Note Due on Visit 10    PT Start Time 1103   from OT session   PT Stop Time 1146    PT Time Calculation (min) 43 min    Equipment Utilized During Treatment Gait belt    Activity Tolerance Patient tolerated treatment well    Behavior During Therapy [facility] for tasks assessed/performed              Past Medical History:  Diagnosis Date   Bilateral leg weakness 04/27/2015   Left foot drop 02/08/2017   Movement disorder    MS (multiple sclerosis) (HCC)    Spastic hemiplegia affecting left nondominant side ([place]) 12/27/2016   Past Surgical History:  Procedure Laterality Date   CESAREAN SECTION     Patient Active Problem List   Diagnosis Date Noted   High risk medication use 02/02/2022   Wheelchair dependence 02/02/2022   Acute asthma exacerbation 01/12/2022   Asthma exacerbation 11/25/2021   Tachycardia 11/25/2021   Pedal edema 11/25/2021   HTN (hypertension) 11/25/2021   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 ([place]) 12/27/2016   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 Pt reports no falls or acute changes since last session. No pain today.  Pt accompanied by: significant other - boyfriend Thurmond Butts)  PERTINENT HISTORY:   Per Note on 02/02/2022  "MS History:   In 2011,  onset of left arm and leg numbness and weakness.  Her symptoms develop over 1 day. She had an MRI performed with lesions consistent with MS. She started to see Dr. Krista Blue here at [facility]. She was placed on Rebif. However, she stopped because her legs felt weaker when she was on it. She then transferred her care to Dr. Shelia Media at [facility].     She was started on Tysabri.  She had several infusions of Tysabri but transportation was difficult to get to [facility]. Therefore, Dr. Shelia Media switched her to Rituxan although she tolerated the medication well. She had a significant relapse with bilateral leg weakness within a couple weeks of the infusion. Therefore, 6 months later, she started Tysabri again. Due to difficulties with transportation, she transferred care to [place] in October 2016.  She converted to JCV Ab positive early 2021 She switched to [place]."     PAIN:  Are you having pain? No  PRECAUTIONS: Fall  WEIGHT BEARING RESTRICTIONS: No  FALLS: Has patient fallen in last 6 months? No  PATIENT GOALS: "I would like to stand and stretch my arm."  OBJECTIVE:   DIAGNOSTIC FINDINGS:   Per note on 02/02/2022 "MRI of the brain, cervical spine 01/13/2022 showed an unchanged distribution of white matter lesions consistent with MS.  There were no enhancing lesions.  MRI of the thoracic spine showed no demyelinating plaques.  No significant degenerative changes.  No spinal stenosis or nerve root  compression.   MRI of the lumbar spine 01/13/2022 was unremarkable.   MRI of the brain 04/20/2021 showed T2/FLAIR hyperintense foci in the hemispheres, brainstem and cerebellum in a pattern and configuration consistent with chronic demyelinating plaque associated with multiple sclerosis.  None of the foci appear to be acute.  Compared to the MRI dated 06/23/2015, there do not appear to be any definite new lesions.  Generalized cortical atrophy that has progressed compared to the 2016 MRI   MRI of the cervical spine 04/20/2021 showed  Multiple discrete and confluent foci within the spinal cord and additional foci noted within the brainstem and cerebellum.  These are consistent with chronic demyelinating plaque associated with multiple sclerosis.  There is no definite change compared to the MRI dated 06/23/2015.   At C4-C5, there are disc osteophyte complexes causing borderline spinal stenosis and mild foraminal narrowing but no nerve root compression.  This has progressed compared to the 2016 MRI.   MRI of the brain 06/25/2015 shows a couple small T2/FLAIR hyperintense foci in the cerebellar hemispheres and other foci in the left middle cerebellar peduncle and pons..   The deep gray matter appears normal.  In the hemispheres, there are are multiple single and confluent T2/FLAIR hyperintense foci, predominantly in the periventricular white matter. Many of these foci are radially oriented to the ventricles.    MRI of the cervical spine 06/23/2015 shows multiple T2 hyperintense foci within the spinal cord. They are located posteriorly to the left adjacent to C2, posteriorly adjacent to C3, posterior to the right adjacent to C4, posterior to the right adjacent to C5, posterior to the right and central adjacent to C6.   None of these foci enhanced after contrast administration."   TODAY'S TREATMENT:  TherEx: In unsupported sitting EOM with 6" step under BLE: Seated mini crunches x 5 reps with min A needed Seated L/R lateral leans x 5 reps with min A needed to return to midline Added to HEP, see bolded below. Encouraged pt to perform exercises while seated in her PWC. TherAct: Reassessed MSIS-29: 85.5% SB transfer PWC to/from mat table with max A to pt's left PWC to mat table and to pt's R side back into PWC. Performed above-noted TherEx while seated EOM in unsupported position with 6" step under BLE. Pt needs assist for anteriolateral lean of trunk during transfer and unable to assist with pushing/pulling with BUE during transfer. Pt also limited by B ankle inversion during transfer and needs assist repositioning limbs for transfer.  PATIENT EDUCATION: Education details: Continue HEP, added to HEP Person educated: Patient and boyfriend Education method: Theatre stage manager Education comprehension: verbalized understanding and needs further education  HOME EXERCISE PROGRAM: From power chair, right lateral flexion in elbow prop with caregiver present (can set up armrest/align to bed for max stretch)  Access Code: [email or web address] URL: [email or web address] Date: 09/11/2022 Prepared by: Malachi Carl  Exercises - Seated Scapular Retraction  - 1 x daily - 7 x weekly - 3 sets - 10 reps - Gentle Levator Scapulae Stretch  - 1 x daily - 7 x weekly - 3 sets - 10 reps - Seated Gentle Upper Trapezius Stretch  - 1 x daily - 7 x weekly - 3 sets - 10 reps - Passive Seated Hamstring Stretch with Chair  - 1 x daily - 7 x weekly - 3 sets - Passive Long Sitting Calf Stretch with Strap  - 1 x daily - 7 x weekly - 3 sets - 60 hold  - Seated Gluteal Sets  - 1 x daily - 7 x weekly - 3 sets - 10 reps - 5 sec hold - Seated Eccentric Abdominal Lean Back  - 1 x daily - 7 x weekly - 3 sets - 10 reps (with PWC in semi-reclined position) - Seated Lateral Trunk Leans with Neutral Spine  -  1 x daily - 7 x weekly - 3 sets - 10 reps (while seated in PWC leaning onto a bed or level table with arm rest lifted)   GOALS: Goals reviewed with patient? Yes  SHORT TERM GOALS: Target date: 09/11/2022  Patient will demonstrate 100% compliance with initial HEP with assistance of caregiver to continue to progress between physical therapy sessions.   Baseline: To be provided; demonstrates appropriate understanding during session with caregiver also demonstrating appropriate understanding/recommend reinforcement Goal status: IN PROGRESS  2. Refer patient to orthotist to assess if patient is appropriate candidate for LE bracing to ensure proper LE stability needed to improve safety with transfers.  Baseline: To be assessed; patient presents with ankle supination and hip abduction; ordered DAT scan to determine if bone integrity appropriate for bracing to attempt standing (order placed on 09/05/2022) Goal status: IN PROGRESS  3.  Patient will perform rolling in B directions with mod A to improve functional mobility. Baseline: Max A, continues to be Max A per caregiver report unable to assess during session given patient request Goal status: NOT MET  4.  Patient will sit EOB with max A x 1 in preparation for transfers to improve mobility. Baseline: total; SBA-modA pending on task and fatigue during last few sessions Goal status: MET  5.  Patient will reduce score on MSIS-29 by 6% to demonstrate progress towards reduced impairment related to  MS. Baseline: 86%, 85.5% (2/28) Goal status: IN PROGRESS   LONG TERM GOALS: Target date: 10/02/2022  Patient will report demonstrate independence with final HEP with assistance of caregiver in order to maintain current gains and continue to progress after physical therapy discharge.   Baseline: To be provided Goal status: INITIAL  2. Patient will demonstrate tolerance for standing in standing walker x 60" with max A from therapist for setup to  progress tolerance for standing activity.  Baseline: Unable to attempt Goal status: INITIAL  3.  Patient will perform rolling in B directions with min A to improve functional mobility. Baseline: Max A Goal status: INITIAL  4.  Patient will sit EOB with mod A x 1 in preparation for transfers to improve mobility. Baseline: total Goal status: INITIAL  5.  Patient will reduce score on MSIS-29 by 20% to demonstrate progress towards reduced impairment related to MS. Baseline: 86% Goal status: INITIAL    ASSESSMENT:  CLINICAL IMPRESSION: Emphasis of skilled PT session on reassessing MSIS-29, working on slide board transfers, working on sitting balance, and adding core strengthening exercises to HEP that pt can perform while seated in w/c. Pt has not shown much improvement in her MSIS-29 score (86% to 85.5%) as her MS continues to limit her functional mobility and impact her quality of life. Pt continues to require max A for SB transfers due to impaired UE strength and ROM, impaired trunk control, and impaired LE strength and ROM. Pt continues to benefit from skilled therapy services due to above-mentioned deficits leading to decreased safety and independence with functional mobility. Continue POC.  Patient will benefit from continued skilled physical therapy to address these impairments and improve modified independence.  OBJECTIVE IMPAIRMENTS: decreased balance, decreased mobility, difficulty walking, decreased ROM, decreased strength, increased edema, impaired tone, and impaired UE functional use.   ACTIVITY LIMITATIONS: standing, transfers, and bed mobility  PARTICIPATION LIMITATIONS: interpersonal relationship and community activity  PERSONAL FACTORS: Past/current experiences and Time since onset of injury/illness/exacerbation are also affecting patient's functional outcome.   REHAB POTENTIAL: Good  CLINICAL DECISION MAKING: Evolving/moderate complexity  EVALUATION COMPLEXITY:  Moderate  PLAN:  PT FREQUENCY: 1x/week  PT DURATION: 8 weeks   PLANNED INTERVENTIONS: Therapeutic exercises, Therapeutic activity, Neuromuscular re-education, Balance training, Gait training, Patient/Family education, Self Care, Joint mobilization, Orthotic/Fit training, and Wheelchair mobility training  PLAN FOR NEXT SESSION: Bed mobility, transfers to and from bed, standing frame as appropriate with LE alignment, review HEP; follow up about bone scan/possible LE bracing, add HS strengthening to HEP (seated HS curls, supine heel slides), work on [employer] for ankle positioning  Excell Seltzer, PT, DPT, CSRS  09/20/2022, 12:17 PM  Check all possible CPT codes: [id number] - PT Re-evaluation, 97110- Therapeutic Exercise, [phone]- Neuro Re-education, [phone] - Gait Training, [phone] - Manual Therapy, 97530 - Therapeutic Activities, and 97535 - [place] all conditions that are expected to impact treatment: Neurological condition and Social determinants of health   If treatment provided at initial evaluation, no treatment charged due to lack of authorization.

## 2022-09-20 NOTE — Therapy (Signed)
OUTPATIENT OCCUPATIONAL THERAPY NEURO TREATMENT  Patient Name: Lindsay Schneider MRN: [id number] DOB:02-Jun-1986, 37 y.o., female Today's Date: 09/20/2022  PCP: [facility]cel REFERRING PROVIDER: Dr. Felecia Shelling  END OF SESSION:  OT End of Session - 09/20/22 1012     Visit Number 3    Number of Visits 7    Date for OT Re-Evaluation 10/09/22    Authorization Type [employer]    Authorization Time Period 6 OT visits 08/29/22 - 10/09/22    Authorization - Visit Number 3    Authorization - Number of Visits 7    OT Start Time 1019    OT Stop Time 1100    OT Time Calculation (min) 41 min    Activity Tolerance Patient tolerated treatment well    Behavior During Therapy [facility] for tasks assessed/performed               Past Medical History:  Diagnosis Date   Bilateral leg weakness 04/27/2015   Left foot drop 02/08/2017   Movement disorder    MS (multiple sclerosis) (HCC)    Spastic hemiplegia affecting left nondominant side ([place]) 12/27/2016   Past Surgical History:  Procedure Laterality Date   CESAREAN SECTION     Patient Active Problem List   Diagnosis Date Noted   High risk medication use 02/02/2022   Wheelchair dependence 02/02/2022   Acute asthma exacerbation 01/12/2022   Asthma exacerbation 11/25/2021   Tachycardia 11/25/2021   Pedal edema 11/25/2021   HTN (hypertension) 11/25/2021   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 She has not used heat for spasticity management and prior to ROM HEP completion.   Pt accompanied by: friend  PERTINENT HISTORY:  Diagnosed with MS in 2011, has an active/relapsing secondary progressive MS. PMH: tachycardia, HTN, asthma  PRECAUTIONS: Fall  WEIGHT BEARING RESTRICTIONS: No  PAIN:  Are you having pain? No  FALLS: Has patient fallen in last 6 months? No  LIVING ENVIRONMENT: Lives with: lives with an adult companion Lives in: House/apartment Stairs: No Has following equipment at home: Ramped entry  PLOF: Needs assistance with ADLs, Needs assistance with homemaking, Needs assistance with gait, and Needs assistance with transfers  PATIENT GOALS: to get arms stronger  OBJECTIVE:   HAND DOMINANCE: Right  ADLs: Overall ADLs: requires  Transfers/ambulation related to ADLs: Eating: feeds elf , needs assist with cutting food Grooming: set up assist brushing teeth UB Dressing: mod A with donning shirt per pt report LB Dressing: dependent for lower body dressing Toileting: total assist uses BSC Bathing: has tub bench, mod A UB, total assist for LB bathing Tub Shower transfers: total assist, has caregiver Equipment: Transfer tub bench  IADLs: Shopping: online hsopping Light housekeeping: dependent Meal Prep: dependent Community mobility: electric w/c Medication management: pt Medical laboratory scientific officer: pt handles online Handwriting:  80% legible  MOBILITY STATUS:  total  assist for transfers, uses electric w/c   FUNCTIONAL OUTCOME MEASURES: Quick Dash: 59%  UPPER EXTREMITY ROM:  RUE 110* AROM shoulder flexion, decreased supination   Passive ROM Right eval Left eval  Shoulder flexion  75  Shoulder abduction    Shoulder adduction    Shoulder  extension    Shoulder internal rotation    Shoulder external rotation    Elbow flexion  Passive WFL, maintains 90* at rest  Elbow extension  -65  Wrist flexion    Wrist extension    Wrist ulnar deviation    Wrist radial deviation    Wrist pronation    Wrist supination    Finger flexion at rest for LUE, P/ROM 90% extension- no active movement in LUE except trace shoulder shrug  HAND FUNCTION: Grip strength: Right: 55.7 lbs; Left: unable   COORDINATION: 9 Hole Peg test: Right: 8 pegs in 2 mins 2 secs sec; Left: unable sec Box and Blocks:  Right 24blocks, Left unable  SENSATION: Light touch: Impaired right hand    MUSCLE TONE: RUE: Within functional limits and LUE: Moderate and Hypertonic maintains L hand fisted   COGNITION: Overall cognitive status: Within functional limits for tasks assessed  VISION: Subjective report: denies changes  VISION ASSESSMENT: Not tested  OBSERVATIONS: n/a  TODAY'S TREATMENT:                                                                                                                              - Therapeutic exercises completed for duration as noted below including:  OT reviewed LUE ROM HEP and use of heat as noted in pt instructions for improved ROM, improved pain, and spasticity management.   OT initiated RUE coordination HEP as noted in pt instructions. As pt mentioned she enjoyed playing cards, OT educated on use of card holder.   PATIENT EDUCATION: Education details: RUE coordination HEP Person educated: Patient and Friend Education method: Explanation, Demonstration, and Handouts Education comprehension: verbalized understanding, returned demonstration, and needs further education  HOME EXERCISE PROGRAM: 2/19: LUE ROM  2/28: R coordination HEP  GOALS: Goals reviewed with patient? Yes  SHORT TERM GOALS: Target date: 09/20/22  I with HEP Baseline:dependent Goal status: IN PROGRESS  2.  I with LUE splint wear care and  precautions for improved positioning, for left hand and elbow Baseline: maintains hand fisted at rest and elbow in 90% flexion without active hand or elbow movement Goal status: INITIAL  3.  2. Pt will verbalize understanding of adapted strategies to maximize pt safety and I with ADLS/ IADLS (ie: rocker knide, built up grips) Baseline: dependent Goal status: INITIAL  4.  Pt will demonstrate improved RUE fine motor coordination as evidenced by placing 9 pegs in 2 mins or less. Baseline: RUE, 8 pegs placed in 2 mins 2 secs. Goal status: INITIAL   LONG TERM GOALS: Target date: 10/09/22  Pt will write her name and address legibly using AE prn Baseline:  80% legible Goal status: INITIAL  2.   Pt will demonstrate improved RUE functional use as evidenced by increasing RUE box/ blocks score to 28 blocks or greater. Baseline: RUE 24 blocks Goal status: INITIAL   ASSESSMENT:  CLINICAL IMPRESSION: Pt can benefit from skilled occupational therapy to maximize pt's safety and I with ADLS/IADLS and to maintain quality of life.  PERFORMANCE DEFICITS: in functional skills including ADLs, IADLs, coordination, dexterity, sensation, tone, ROM, strength, flexibility, Fine motor control, Gross motor control, mobility, balance, endurance, decreased knowledge of precautions, decreased knowledge of use of DME, and UE functional use, cognitive skills including psychosocial skills including coping strategies, environmental adaptation, habits, interpersonal interactions, and routines and behaviors.   IMPAIRMENTS: are limiting patient from ADLs, IADLs, rest and sleep, play, leisure, and social participation.   CO-MORBIDITIES: may have co-morbidities  that affects occupational performance. Patient will benefit from skilled OT to address above impairments and improve overall function.  REHAB POTENTIAL: Good  PLAN:  OT FREQUENCY: 1x/week plus eval  OT DURATION: 7 weeks  PLANNED INTERVENTIONS: self care/ADL  training, therapeutic exercise, therapeutic activity, neuromuscular re-education, manual therapy, passive range of motion, balance training, stair training, splinting, paraffin, fluidotherapy, moist heat, cryotherapy, patient/family education, energy conservation, coping strategies training, and DME and/or AE instructions  RECOMMENDED OTHER SERVICES: n/a  CONSULTED AND AGREED WITH PLAN OF CARE: Patient  PLAN FOR NEXT SESSION: splinting needs for LUE(possible bean bag splint elbow and resting hand); review coordination HEP; writing techniques  Dennis Bast, OT 09/20/2022, 11:06 AM  Check all possible CPT codes: [id number] - OT Re-evaluation, 97110- Therapeutic Exercise, [phone]- Neuro Re-education, 97140 - Manual Therapy, 97530 - Therapeutic Activities, [id number] - Self Care, 97014 - Electrical stimulation (unattended), 97035 - Ultrasound, [id number] - Orthotic Fit, [id number] - Fluidotherapy, [id number] - Contrast bath, and [id number] -  Paraffin    Check all conditions that are expected to impact treatment: Neurological condition   If treatment provided at initial evaluation, no treatment charged due to lack of authorization.

## 2022-09-20 NOTE — Patient Instructions (Signed)
  Coordination Activities  Perform the following activities for 10-15 minutes 2 times per day with right hand(s).  Flip cards 1 at a time as fast as you can.  Pick up coins, buttons, marbles, dried beans/pasta of different sizes and place in container.  Pick up coins and stack.  Pick up coins one at a time until you get 5-10 in your hand, then move coins from palm to fingertips to stack one at a time.  Twirl pen between fingers.

## 2022-09-25 ENCOUNTER — Other Ambulatory Visit: Payer: Self-pay | Admitting: Neurology

## 2022-09-25 ENCOUNTER — Encounter: Payer: Self-pay | Admitting: Occupational Therapy

## 2022-09-25 ENCOUNTER — Ambulatory Visit: Payer: Medicare (Managed Care) | Attending: Neurology | Admitting: Occupational Therapy

## 2022-09-25 ENCOUNTER — Ambulatory Visit: Payer: Medicare (Managed Care) | Admitting: Physical Therapy

## 2022-09-25 ENCOUNTER — Telehealth: Payer: Self-pay | Admitting: Physical Therapy

## 2022-09-25 DIAGNOSIS — M25622 Stiffness of left elbow, not elsewhere classified: Secondary | ICD-10-CM | POA: Insufficient documentation

## 2022-09-25 DIAGNOSIS — R269 Unspecified abnormalities of gait and mobility: Secondary | ICD-10-CM

## 2022-09-25 DIAGNOSIS — R252 Cramp and spasm: Secondary | ICD-10-CM

## 2022-09-25 DIAGNOSIS — R278 Other lack of coordination: Secondary | ICD-10-CM | POA: Insufficient documentation

## 2022-09-25 DIAGNOSIS — M6281 Muscle weakness (generalized): Secondary | ICD-10-CM

## 2022-09-25 DIAGNOSIS — M25642 Stiffness of left hand, not elsewhere classified: Secondary | ICD-10-CM | POA: Insufficient documentation

## 2022-09-25 DIAGNOSIS — R29898 Other symptoms and signs involving the musculoskeletal system: Secondary | ICD-10-CM

## 2022-09-25 DIAGNOSIS — G35 Multiple sclerosis: Secondary | ICD-10-CM

## 2022-09-25 NOTE — Patient Instructions (Signed)
Your Splint This splint should initially be fitted by a healthcare practitioner.  The healthcare practitioner is responsible for providing wearing instructions and precautions to the patient, other healthcare practitioners and care provider involved in the patient's care.  This splint was custom made for you. Please read the following instructions to learn about wearing and caring for your splint.  Precautions Should your splint cause any of the following problems, remove the splint immediately and contact your therapist/physician. Swelling Severe Pain Pressure Areas Stiffness Numbness  Do not wear your splint while operating machinery unless it has been fabricated for that purpose.  When To Wear Your Splint Where your splint according to your therapist/physician instructions. Nights and rest periods only (however begin wearing during the day the first 1-3 days gradually building up tolerance to 4 consecutive hours before switching to night time)   Care and Cleaning of Your Splint Keep your splint away from open flames or heat sources. Keep splint away from pets Your splint will lose its shape in temperatures over 135 degrees Farenheit, ( in car windows, near radiators, ovens or in hot water).  Never make any adjustments to your splint, if the splint needs adjusting remove it and make an appointment to see your therapist. Your splint may be cleaned with rubbing alcohol.  Do not immerse in hot water over 135 degrees Farenheit.

## 2022-09-25 NOTE — Telephone Encounter (Signed)
Dr. Felecia Shelling,  Alphonsa Overall is being treated by physical therapy for her MS.  Thena will benefit from use of bilateral AFO night splints to address her plantarflexion contractures in order to improve safety with functional mobility.    If you agree, please submit request in EPIC under MD Order, Other Orders (list bilateral AFOs in comments) or fax to [facility] at [phone].   Thank you, Excell Seltzer, PT, DPT, [facility] [address] [place], [place]  [address] Phone:  [phone] Fax:  [phone]

## 2022-09-25 NOTE — Therapy (Signed)
OUTPATIENT PHYSICAL THERAPY NEURO TREATMENT   Patient Name: Lindsay Schneider MRN: [id number] DOB:05-26-1986, 36 y.o., female Today's Date: 09/25/2022   PCP: [facility] - comes to house for visits  REFERRING PROVIDER: Britt Bottom,  END OF SESSION:  PT End of Session - 09/25/22 1149     Visit Number 6    Number of Visits 8    Date for PT Re-Evaluation 10/16/22    Authorization Type Traditional Medicaid    Authorization Time Period approved 8 PT visits 09/20/22-11/14/22    Authorization - Number of Visits 8    Progress Note Due on Visit 10    PT Start Time 1149   from OT session   PT Stop Time 1227    PT Time Calculation (min) 38 min    Equipment Utilized During Treatment Gait belt    Activity Tolerance Patient tolerated treatment well    Behavior During Therapy [facility] for tasks assessed/performed               Past Medical History:  Diagnosis Date   Bilateral leg weakness 04/27/2015   Left foot drop 02/08/2017   Movement disorder    MS (multiple sclerosis) ([place])    Spastic hemiplegia affecting left nondominant side ([place]) 12/27/2016   Past Surgical History:  Procedure Laterality Date   CESAREAN SECTION     Patient Active Problem List   Diagnosis Date Noted   High risk medication use 02/02/2022   Wheelchair dependence 02/02/2022   Acute asthma exacerbation 01/12/2022   Asthma exacerbation 11/25/2021   Tachycardia 11/25/2021   Pedal edema 11/25/2021   HTN (hypertension) 11/25/2021   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 ([place]) 12/27/2016   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   Gait disturbance 04/27/2015   Urinary disorder 04/27/2015   Right leg weakness 04/27/2015    ONSET DATE: 06/09/2022  REFERRING DIAG: G35 (ICD-10-CM) - Multiple sclerosis (HCC) R26.9 (ICD-10-CM) - Gait disturbance  THERAPY DIAG:  Other lack of coordination  Muscle weakness (generalized)  Abnormality of gait and  mobility  Rationale for Evaluation and Treatment: Rehabilitation  SUBJECTIVE:                                                                                                                                                                                             SUBJECTIVE STATEMENT: Pt reports no falls or other acute changes since last session. No pain today. Pt has been able to work on her HEP a little bit. Pt asking about bracing for her BLE/ankles.  Pt accompanied by: significant other - boyfriend Thurmond Butts)  [place]  HISTORY:   Per Note on 02/02/2022  "MS History:   In 2011, onset of left arm and leg numbness and weakness.  Her symptoms develop over 1 day. She had an MRI performed with lesions consistent with MS. She started to see Dr. Krista Blue here at [facility]. She was placed on Rebif. However, she stopped because her legs felt weaker when she was on it. She then transferred her care to Dr. Shelia Media at [facility].     She was started on Tysabri.  She had several infusions of Tysabri but transportation was difficult to get to [facility]. Therefore, Dr. Shelia Media switched her to Rituxan although she tolerated the medication well. She had a significant relapse with bilateral leg weakness within a couple weeks of the infusion. Therefore, 6 months later, she started Tysabri again. Due to difficulties with transportation, she transferred care to [place] in October 2016.  She converted to JCV Ab positive early 2021 She switched to Imbery."     PAIN:  Are you having pain? No  PRECAUTIONS: Fall  WEIGHT BEARING RESTRICTIONS: No  FALLS: Has patient fallen in last 6 months? No  PATIENT GOALS: "I would like to stand and stretch my arm."  OBJECTIVE:   DIAGNOSTIC FINDINGS:   Per note on 02/02/2022 "MRI of the brain, cervical spine 01/13/2022 showed an unchanged distribution of white matter lesions consistent with MS.  There were no enhancing lesions.  MRI of the thoracic spine showed no demyelinating plaques.  No  significant degenerative changes.  No spinal stenosis or nerve root compression.   MRI of the lumbar spine 01/13/2022 was unremarkable.   MRI of the brain 04/20/2021 showed T2/FLAIR hyperintense foci in the hemispheres, brainstem and cerebellum in a pattern and configuration consistent with chronic demyelinating plaque associated with multiple sclerosis.  None of the foci appear to be acute.  Compared to the MRI dated 06/23/2015, there do not appear to be any definite new lesions.  Generalized cortical atrophy that has progressed compared to the 2016 MRI   MRI of the cervical spine 04/20/2021 showed  Multiple discrete and confluent foci within the spinal cord and additional foci noted within the brainstem and cerebellum.  These are consistent with chronic demyelinating plaque associated with multiple sclerosis.  There is no definite change compared to the MRI dated 06/23/2015.   At C4-C5, there are disc osteophyte complexes causing borderline spinal stenosis and mild foraminal narrowing but no nerve root compression.  This has progressed compared to the 2016 MRI.   MRI of the brain 06/25/2015 shows a couple small T2/FLAIR hyperintense foci in the cerebellar hemispheres and other foci in the left middle cerebellar peduncle and pons..   The deep gray matter appears normal.  In the hemispheres, there are are multiple single and confluent T2/FLAIR hyperintense foci, predominantly in the periventricular white matter. Many of these foci are radially oriented to the ventricles.    MRI of the cervical spine 06/23/2015 shows multiple T2 hyperintense foci within the spinal cord. They are located posteriorly to the left adjacent to C2, posteriorly adjacent to C3, posterior to the right adjacent to C4, posterior to the right adjacent to C5, posterior to the right and central adjacent to C6.   None of these foci enhanced after contrast administration."   TODAY'S TREATMENT:  TherEx: Attempted to have pt perform seated LAQ and seated HS curls while seated in PWC, due to muscle weakness unable to perform. Transitioned to reviewing supine glute sets and heel slides that pt can perform at home with assist from her caregiver.  Also reviewed B ankle stretching due to PF and inversion contractures, reviewed how to safely assist pt with DF and eversion stretches.  Added to HEP, see bolded below  TherAct: Assessed L ankle: decreased eversion PROM (ankle inverted) as well as decreased DF (PF contracture) and some edema. Discussed types of splinting/braces that pt may benefit from such as a night splint for dorsiflexion assist in order to reduce her PF contracture vs PRAFO. Due to severity of her contracture pt may benefit more from a night splint, will reach out to referring provider for order.  Also discussed PT POC with plan to d/c after this POC to allow time for pt to work on management of LE contractures at home, LE strengthening, and to schedule a bone density can before she returns to therapy to attempt working on standing.  PATIENT EDUCATION: Education details: Continue HEP, added to HEP Person educated: Patient and boyfriend Education method: Theatre stage manager Education comprehension: verbalized understanding and needs further education  HOME EXERCISE PROGRAM: From power chair, right lateral flexion in elbow prop with caregiver present (can set up armrest/align to bed for max stretch)  Access Code: [email or web address] URL: [email or web address] Date: 09/11/2022 Prepared by: Malachi Carl  Exercises - Seated Scapular Retraction  - 1 x daily - 7 x weekly - 3 sets - 10 reps - Gentle Levator Scapulae Stretch  - 1 x daily - 7 x weekly - 3 sets - 10 reps - Seated Gentle Upper Trapezius Stretch  - 1 x daily - 7 x weekly - 3 sets - 10 reps - Passive Seated Hamstring Stretch  with Chair  - 1 x daily - 7 x weekly - 3 sets - Passive Long Sitting Calf Stretch with Strap  - 1 x daily - 7 x weekly - 3 sets - 60 hold - Seated Gluteal Sets  - 1 x daily - 7 x weekly - 3 sets - 10 reps - 5 sec hold - Seated Eccentric Abdominal Lean Back  - 1 x daily - 7 x weekly - 3 sets - 10 reps (with PWC in semi-reclined position) - Seated Lateral Trunk Leans with Neutral Spine  - 1 x daily - 7 x weekly - 3 sets - 10 reps (while seated in PWC leaning onto a bed or level table with arm rest lifted) - Foot Dorsiflexion PROM Caregiver  - 1 x daily - 7 x weekly - 1 sets - 5-10 reps - 30-60 sec hold - Ankle Eversion Stretch with Caregiver  - 1 x daily - 7 x weekly - 1 sets - 5-10 reps - 30-60 sec hold - Supine Quad Set  - 1 x daily - 7 x weekly - 3 sets - 10 reps - Supine Heel Slide with Strap  - 1 x daily - 7 x weekly - 3 sets - 10 reps   GOALS: Goals reviewed with patient? Yes  SHORT TERM GOALS: Target date: 09/11/2022  Patient will demonstrate 100% compliance with initial HEP with assistance of caregiver to continue to progress between physical therapy sessions.   Baseline: To be provided; demonstrates appropriate understanding during session with caregiver also demonstrating appropriate understanding/recommend reinforcement Goal status: IN PROGRESS  2. Refer patient to orthotist to assess if patient  is appropriate candidate for LE bracing to ensure proper LE stability needed to improve safety with transfers.  Baseline: To be assessed; patient presents with ankle supination and hip abduction; ordered DAT scan to determine if bone integrity appropriate for bracing to attempt standing (order placed on 09/05/2022) Goal status: IN PROGRESS  3.  Patient will perform rolling in B directions with mod A to improve functional mobility. Baseline: Max A, continues to be Max A per caregiver report unable to assess during session given patient request Goal status: NOT MET  4.  Patient will sit EOB  with max A x 1 in preparation for transfers to improve mobility. Baseline: total; SBA-modA pending on task and fatigue during last few sessions Goal status: MET  5.  Patient will reduce score on MSIS-29 by 6% to demonstrate progress towards reduced impairment related to MS. Baseline: 86%, 85.5% (2/28) Goal status: IN PROGRESS   LONG TERM GOALS: Target date: 10/02/2022  Patient will report demonstrate independence with final HEP with assistance of caregiver in order to maintain current gains and continue to progress after physical therapy discharge.   Baseline: To be provided Goal status: INITIAL  2. Patient will demonstrate tolerance for standing in standing walker x 60" with max A from therapist for setup to progress tolerance for standing activity.  Baseline: Unable to attempt Goal status: INITIAL  3.  Patient will perform rolling in B directions with min A to improve functional mobility. Baseline: Max A Goal status: INITIAL  4.  Patient will sit EOB with mod A x 1 in preparation for transfers to improve mobility. Baseline: total Goal status: INITIAL  5.  Patient will reduce score on MSIS-29 by 20% to demonstrate progress towards reduced impairment related to MS. Baseline: 86% Goal status: INITIAL    ASSESSMENT:  CLINICAL IMPRESSION: Emphasis of skilled PT session on adding stretching and strengthening exercises to HEP, discussing bracing options for her B ankles, and discussing PT POC. 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 Reviewed how to safely stretch these joints with pt's boyfriend as well as problem-solved some LE strengthening exercises that pt can perform at home. Patient will benefit from continued skilled physical therapy to address these impairments and improve modified independence. Continue POC.  OBJECTIVE IMPAIRMENTS: decreased balance, decreased mobility, difficulty walking, decreased ROM, decreased strength, increased  edema, impaired tone, and impaired UE functional use.   ACTIVITY LIMITATIONS: standing, transfers, and bed mobility  PARTICIPATION LIMITATIONS: interpersonal relationship and community activity  PERSONAL FACTORS: Past/current experiences and Time since onset of injury/illness/exacerbation are also affecting patient's functional outcome.   REHAB POTENTIAL: Good  CLINICAL DECISION MAKING: Evolving/moderate complexity  EVALUATION COMPLEXITY: Moderate  PLAN:  PT FREQUENCY: 1x/week  PT DURATION: 8 weeks   PLANNED INTERVENTIONS: Therapeutic exercises, Therapeutic activity, Neuromuscular re-education, Balance training, Gait training, Patient/Family education, Self Care, Joint mobilization, Orthotic/Fit training, and Wheelchair mobility training  PLAN FOR NEXT SESSION: Bed mobility (rolling over in bed, handout for bedrail?), transfers to and from bed, did we get order for PRAFOs vs night splint?  Excell Seltzer, PT, DPT, CSRS  09/25/2022, 12:28 PM  Check all possible CPT codes: [phone] - PT Re-evaluation, 97110- Therapeutic Exercise, [phone]- Neuro Re-education, [phone] - Gait Training, [phone] - Manual Therapy, [phone] - Therapeutic Activities, and [phone] - Self Care    Check all conditions that are expected to impact treatment: Neurological condition and Social determinants of health   If treatment provided at initial evaluation, no treatment charged  due to lack of authorization.

## 2022-09-25 NOTE — Therapy (Signed)
OUTPATIENT OCCUPATIONAL THERAPY NEURO TREATMENT  Patient Name: Lindsay Schneider MRN: [id number] DOB:1985-09-29, 37 y.o., female Today's Date: 09/25/2022  PCP: [facility] REFERRING PROVIDER: Dr. Felecia Shelling  END OF SESSION:  OT End of Session - 09/25/22 1107     Visit Number 4    Number of Visits 7    Date for OT Re-Evaluation 10/09/22    Authorization Type [employer] Medicaid    Authorization Time Period 6 OT visits 08/29/22 - 10/09/22    Authorization - Visit Number 4    Authorization - Number of Visits 7    OT Start Time 1104    OT Stop Time 1145    OT Time Calculation (min) 41 min    Activity Tolerance Patient tolerated treatment well    Behavior During Therapy [facility] for tasks assessed/performed               Past Medical History:  Diagnosis Date   Bilateral leg weakness 04/27/2015   Left foot drop 02/08/2017   Movement disorder    MS (multiple sclerosis) ([place])    Spastic hemiplegia affecting left nondominant side ([place]) 12/27/2016   Past Surgical History:  Procedure Laterality Date   CESAREAN SECTION     Patient Active Problem List   Diagnosis Date Noted   High risk medication use 02/02/2022   Wheelchair dependence 02/02/2022   Acute asthma exacerbation 01/12/2022   Asthma exacerbation 11/25/2021   Tachycardia 11/25/2021   Pedal edema 11/25/2021   HTN (hypertension) 11/25/2021   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 ([place]) 12/27/2016   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   Gait disturbance 04/27/2015   Urinary disorder 04/27/2015   Right leg weakness 04/27/2015    ONSET DATE: 07/12/22  REFERRING DIAG: G81.14 (ICD-10-CM) - 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 PMH: tachycardia, HTN, asthma  PRECAUTIONS: Fall  WEIGHT BEARING RESTRICTIONS: No  PAIN:  Are you having pain? No  FALLS: Has patient fallen in last 6 months? No  LIVING ENVIRONMENT: Lives with: lives with an adult companion Lives in: House/apartment Stairs: No Has following equipment at home: Ramped entry  PLOF: Needs assistance with ADLs, Needs assistance with homemaking, Needs assistance with gait, and Needs assistance with transfers  PATIENT GOALS: to get arms stronger  OBJECTIVE:   HAND DOMINANCE: Right  ADLs: Overall ADLs: requires  Transfers/ambulation related to ADLs: Eating: feeds elf , needs assist with cutting food Grooming: set up assist brushing teeth UB Dressing: mod A with donning shirt per pt report LB Dressing: dependent for lower body dressing Toileting: total assist uses BSC Bathing: has tub bench, mod A UB, total assist for LB bathing Tub Shower transfers: total assist, has caregiver Equipment: Transfer tub bench  IADLs: Shopping: online hsopping Light housekeeping: dependent Meal Prep: dependent Community mobility: electric w/c Medication management: pt Medical laboratory scientific officer: pt handles online Handwriting:  80% legible  MOBILITY STATUS:  total assist for transfers, uses electric w/c   FUNCTIONAL OUTCOME MEASURES: Quick Dash: 59%  UPPER EXTREMITY ROM:  RUE 110* AROM shoulder flexion, decreased supination   Passive ROM Right eval Left eval  Shoulder flexion  75  Shoulder abduction    Shoulder adduction    Shoulder extension    Shoulder internal rotation    Shoulder external rotation    Elbow flexion  Passive WFL,  maintains 90* at rest  Elbow extension  -65  Wrist flexion    Wrist extension    Wrist ulnar deviation    Wrist radial deviation    Wrist pronation    Wrist supination    Finger flexion at rest for LUE, P/ROM 90% extension- no active movement in LUE except trace shoulder shrug  HAND FUNCTION: Grip strength: Right: 55.7 lbs; Left: unable   COORDINATION: 9 Hole Peg test: Right: 8 pegs in 2 mins 2 secs sec; Left: unable sec Box and Blocks:  Right 24blocks, Left unable  SENSATION: Light touch: Impaired right hand    MUSCLE TONE: RUE: Within functional limits and LUE: Moderate and Hypertonic maintains L hand fisted   COGNITION: Overall cognitive status: Within functional limits for tasks assessed  VISION: Subjective report: denies changes  VISION ASSESSMENT: Not tested  OBSERVATIONS: n/a  TODAY'S TREATMENT:                                                                                                                              Fabricated and fitted resting hand splint to patient. Issued splint and reviewed wear and care. Pt instructed to wear during the day the first 1-3 days and gradually increasing time until pt can tolerate 4 consecutive hours with no problems. Then patient can switch to wearing resting hand splint at night.  Pt also issued handout on beanbag splint to order for elbow if she desires. Pt instructed in correct size for elbow splint. However, pt shown alternative way with towel roll if pt cannot order bean bag splint  PATIENT EDUCATION: Education details: splint wear and care Person educated: Patient and Friend Education method: Explanation, Demonstration, and Handouts Education comprehension: verbalized understanding   HOME EXERCISE PROGRAM: 2/19: LUE ROM  2/28: R coordination HEP 09/25/22: splint wear and care   GOALS: Goals reviewed with patient? Yes  SHORT TERM GOALS: Target date: 09/20/22  I with HEP Baseline:dependent Goal status: IN  PROGRESS  2.  I with LUE splint wear care and precautions for improved positioning, for left hand and elbow Baseline: maintains hand fisted at rest and elbow in 90% flexion without active hand or elbow movement Goal status: IN PROGRESS  3.  2. Pt will verbalize understanding of adapted strategies to maximize pt safety and I with ADLS/ IADLS (ie: rocker knide, built up grips) Baseline: dependent Goal status: INITIAL  4.  Pt will demonstrate improved RUE fine motor coordination as evidenced by placing 9 pegs in 2 mins or less. Baseline: RUE, 8 pegs placed in 2 mins 2 secs. Goal status: INITIAL   LONG TERM GOALS: Target date: 10/09/22  Pt will write her name and address legibly using AE prn Baseline: 80% legible Goal status: INITIAL  2.   Pt will demonstrate improved RUE functional use as evidenced by increasing RUE box/ blocks score to 28 blocks or greater. Baseline: RUE 24 blocks Goal status: INITIAL   ASSESSMENT:  CLINICAL IMPRESSION: Pt can benefit from skilled occupational therapy to maximize pt's safety and I with ADLS/IADLS and to maintain quality of life.  PERFORMANCE DEFICITS: in functional skills including ADLs, IADLs, coordination, dexterity, sensation, tone, ROM, strength, flexibility, Fine motor control, Gross motor control, mobility, balance, endurance, decreased knowledge of precautions, decreased knowledge of use of DME, and UE functional use, cognitive skills including psychosocial skills including coping strategies, environmental adaptation, habits, interpersonal interactions, and routines and behaviors.   IMPAIRMENTS: are limiting patient from ADLs, IADLs, rest and sleep, play, leisure, and social participation.   CO-MORBIDITIES: may have co-morbidities  that affects occupational performance. Patient will benefit from skilled OT to address above impairments and improve overall function.  REHAB POTENTIAL: Good  PLAN:  OT FREQUENCY: 1x/week plus eval  OT  DURATION: 7 weeks  PLANNED INTERVENTIONS: self care/ADL training, therapeutic exercise, therapeutic activity, neuromuscular re-education, manual therapy, passive range of motion, balance training, stair training, splinting, paraffin, fluidotherapy, moist heat, cryotherapy, patient/family education, energy conservation, coping strategies training, and DME and/or AE instructions  RECOMMENDED OTHER SERVICES: n/a  CONSULTED AND AGREED WITH PLAN OF CARE: Patient  PLAN FOR NEXT SESSION: Splint adjustments LUE prn; review coordination HEP; writing techniques and functional use RUE, A/E needs, assess goals and possible d/c next session?   Hans Eden, OT 09/25/2022, 11:08 AM  Check all possible CPT codes: [phone] - OT Re-evaluation, 97110- Therapeutic Exercise, [phone]- Neuro Re-education, 97140 - Manual Therapy, 97530 - Therapeutic Activities, [phone] - Self Care, [phone] - Electrical stimulation (unattended), [phone] - Ultrasound, [phone] - Orthotic Fit, [id number] - Fluidotherapy, [id number] - Contrast bath, and [id number] -  Paraffin    Check all conditions that are expected to impact treatment: Neurological condition   If treatment provided at initial evaluation, no treatment charged due to lack of authorization.

## 2022-09-27 ENCOUNTER — Telehealth: Payer: Self-pay | Admitting: *Deleted

## 2022-09-27 NOTE — Telephone Encounter (Addendum)
Called Medicare part A [id number] and spoke w/ Tonia Brooms. States no PA needed for [place] [phone]). Ref# for call [id number]. She states in future, we can go to website: [email or web address] Go to forms tab, PA, click on PA form option. If J code not listed, no PA needed.  Last infusion 08/03/22 and next infusion 02/01/23. Pt receives at [facility]. Address: [address], [place], [place] [address] Phone: [phone].   Submitted PA via Medicaid on nctracks. Confirmation#: [id number] W. Waiting on determination.

## 2022-10-02 ENCOUNTER — Ambulatory Visit: Payer: Medicare (Managed Care) | Admitting: Physical Therapy

## 2022-10-02 ENCOUNTER — Ambulatory Visit: Payer: Medicare (Managed Care) | Admitting: Occupational Therapy

## 2022-10-04 NOTE — Telephone Encounter (Signed)
PA via Medicaid denied upon checking status on nctracks. Resubmitted PA. Confirmation Number: [id number] W. Waiting on determination from insurance.

## 2022-10-06 ENCOUNTER — Ambulatory Visit: Payer: Medicare (Managed Care) | Admitting: Physical Therapy

## 2022-10-06 DIAGNOSIS — M6281 Muscle weakness (generalized): Secondary | ICD-10-CM

## 2022-10-06 DIAGNOSIS — R278 Other lack of coordination: Secondary | ICD-10-CM | POA: Diagnosis not present

## 2022-10-06 DIAGNOSIS — R269 Unspecified abnormalities of gait and mobility: Secondary | ICD-10-CM

## 2022-10-06 NOTE — Therapy (Signed)
OUTPATIENT PHYSICAL THERAPY NEURO TREATMENT   Patient Name: Lindsay Schneider MRN: [id number] DOB:1985/09/11, 37 y.o., female Today's Date: 10/06/2022   PCP: [facility] - comes to house for visits  REFERRING PROVIDER: Britt Bottom,  END OF SESSION:  PT End of Session - 10/06/22 1232     Visit Number 7    Number of Visits 8    Date for PT Re-Evaluation 10/16/22    Authorization Type Traditional Medicaid    Authorization Time Period approved 8 PT visits 09/20/22-11/14/22    Authorization - Number of Visits 8    Progress Note Due on Visit 10    PT Start Time 1230    PT Stop Time 1318    PT Time Calculation (min) 48 min    Equipment Utilized During Treatment Gait belt    Activity Tolerance Patient tolerated treatment well    Behavior During Therapy [facility] for tasks assessed/performed                Past Medical History:  Diagnosis Date   Bilateral leg weakness 04/27/2015   Left foot drop 02/08/2017   Movement disorder    MS (multiple sclerosis) ([place])    Spastic hemiplegia affecting left nondominant side ([place]) 12/27/2016   Past Surgical History:  Procedure Laterality Date   CESAREAN SECTION     Patient Active Problem List   Diagnosis Date Noted   High risk medication use 02/02/2022   Wheelchair dependence 02/02/2022   Acute asthma exacerbation 01/12/2022   Asthma exacerbation 11/25/2021   Tachycardia 11/25/2021   Pedal edema 11/25/2021   HTN (hypertension) 11/25/2021   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 ([place]) 12/27/2016   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   Gait disturbance 04/27/2015   Urinary disorder 04/27/2015   Right leg weakness 04/27/2015    ONSET DATE: 06/09/2022  REFERRING DIAG: G35 (ICD-10-CM) - Multiple sclerosis (HCC) R26.9 (ICD-10-CM) - Gait disturbance  THERAPY DIAG:  Other lack of coordination  Muscle weakness (generalized)  Abnormality of gait and  mobility  Rationale for Evaluation and Treatment: Rehabilitation  SUBJECTIVE:                                                                                                                                                                                             SUBJECTIVE STATEMENT: Pt reports no pain today. No falls or other acute changes since last visit. Pt asking about standing in therapy, updated her on process.  Pt accompanied by: significant other - boyfriend Thurmond Butts)  PERTINENT HISTORY:   Per Note on 02/02/2022  "MS History:  In 2011, onset of left arm and leg numbness and weakness.  Her symptoms develop over 1 day. She had an MRI performed with lesions consistent with MS. She started to see Dr. Krista Blue here at [facility]. She was placed on Rebif. However, she stopped because her legs felt weaker when she was on it. She then transferred her care to Dr. Shelia Media at [facility].     She was started on Tysabri.  She had several infusions of Tysabri but transportation was difficult to get to [facility]. Therefore, Dr. Shelia Media switched her to Rituxan although she tolerated the medication well. She had a significant relapse with bilateral leg weakness within a couple weeks of the infusion. Therefore, 6 months later, she started Tysabri again. Due to difficulties with transportation, she transferred care to [place] in October 2016.  She converted to JCV Ab positive early 2021 She switched to [place]."     PAIN:  Are you having pain? No  PRECAUTIONS: Fall  WEIGHT BEARING RESTRICTIONS: No  FALLS: Has patient fallen in last 6 months? No  PATIENT GOALS: "I would like to stand and stretch my arm."  OBJECTIVE:   DIAGNOSTIC FINDINGS:   Per note on 02/02/2022 "MRI of the brain, cervical spine 01/13/2022 showed an unchanged distribution of white matter lesions consistent with MS.  There were no enhancing lesions.  MRI of the thoracic spine showed no demyelinating plaques.  No significant degenerative changes.  No  spinal stenosis or nerve root compression.   MRI of the lumbar spine 01/13/2022 was unremarkable.   MRI of the brain 04/20/2021 showed T2/FLAIR hyperintense foci in the hemispheres, brainstem and cerebellum in a pattern and configuration consistent with chronic demyelinating plaque associated with multiple sclerosis.  None of the foci appear to be acute.  Compared to the MRI dated 06/23/2015, there do not appear to be any definite new lesions.  Generalized cortical atrophy that has progressed compared to the 2016 MRI   MRI of the cervical spine 04/20/2021 showed  Multiple discrete and confluent foci within the spinal cord and additional foci noted within the brainstem and cerebellum.  These are consistent with chronic demyelinating plaque associated with multiple sclerosis.  There is no definite change compared to the MRI dated 06/23/2015.   At C4-C5, there are disc osteophyte complexes causing borderline spinal stenosis and mild foraminal narrowing but no nerve root compression.  This has progressed compared to the 2016 MRI.   MRI of the brain 06/25/2015 shows a couple small T2/FLAIR hyperintense foci in the cerebellar hemispheres and other foci in the left middle cerebellar peduncle and pons..   The deep gray matter appears normal.  In the hemispheres, there are are multiple single and confluent T2/FLAIR hyperintense foci, predominantly in the periventricular white matter. Many of these foci are radially oriented to the ventricles.    MRI of the cervical spine 06/23/2015 shows multiple T2 hyperintense foci within the spinal cord. They are located posteriorly to the left adjacent to C2, posteriorly adjacent to C3, posterior to the right adjacent to C4, posterior to the right adjacent to C5, posterior to the right and central adjacent to C6.   None of these foci enhanced after contrast administration."   TODAY'S TREATMENT:  TherEx: Seated LLE ankle PROM in all available planes of motion. Pt tends to keep ankle in inverted and PF position with PF contracture. Pt able to be stretched into eversion but due to PF contracture has limited DF PROM. Updated pt on process of obtaining night splints for her B ankle contractures as well as discussed how to adjust LE position in w/c to work towards keeping LE and ankles in more neutral alignment. Pt to reach out to [employer] about adjusting her LE in chair. Pt also with significant edema in her LLE, encouraged her to keeps limbs elevated when seated in PWC.  TherAct:  Wrote out instructions for patient to work on after d/c from therapy next week and PT POC moving forwards:  When you see Dr. Felecia Shelling in April make sure he indicates in his visit note your need for bilateral night splints due to your ankle contractures. Once this note is completed then [facility] can move forwards with getting you the braces for your feet. If you don't hear from [facility] within a week of your appointment with Dr. Felecia Shelling reach out to them [phone]).  Also, ask Dr. Felecia Shelling about the DEXA (bone density) scan. It has been ordered but not scheduled.  Once you have the ankle braces and have completed the scan of your bones you can get a new referral to come back to Physical Therapy. If your bone density is safe we can work on standing in therapy at that time.  Also provided contact information for [facility] for pt to reach out after her face-to-face visit with Dr. Felecia Shelling.  PATIENT EDUCATION: Education details: Continue HEP, PT POC Person educated: Patient and boyfriend Education method: Theatre stage manager Education comprehension: verbalized understanding and needs further education  HOME EXERCISE PROGRAM: From power chair, right lateral flexion in elbow prop with caregiver present (can set up armrest/align to bed for max stretch)  Access Code: [email or web address] URL:  [email or web address] Date: 09/11/2022 Prepared by: Malachi Carl  Exercises - Seated Scapular Retraction  - 1 x daily - 7 x weekly - 3 sets - 10 reps - Gentle Levator Scapulae Stretch  - 1 x daily - 7 x weekly - 3 sets - 10 reps - Seated Gentle Upper Trapezius Stretch  - 1 x daily - 7 x weekly - 3 sets - 10 reps - Passive Seated Hamstring Stretch with Chair  - 1 x daily - 7 x weekly - 3 sets - Passive Long Sitting Calf Stretch with Strap  - 1 x daily - 7 x weekly - 3 sets - 60 hold - Seated Gluteal Sets  - 1 x daily - 7 x weekly - 3 sets - 10 reps - 5 sec hold - Seated Eccentric Abdominal Lean Back  - 1 x daily - 7 x weekly - 3 sets - 10 reps (with PWC in semi-reclined position) - Seated Lateral Trunk Leans with Neutral Spine  - 1 x daily - 7 x weekly - 3 sets - 10 reps (while seated in PWC leaning onto a bed or level table with arm rest lifted) - Foot Dorsiflexion PROM Caregiver  - 1 x daily - 7 x weekly - 1 sets - 5-10 reps - 30-60 sec hold - Ankle Eversion Stretch with Caregiver  - 1 x daily - 7 x weekly - 1 sets - 5-10 reps - 30-60 sec hold - Supine Quad Set  - 1 x daily - 7 x weekly - 3 sets - 10 reps - Supine Heel Slide  with Strap  - 1 x daily - 7 x weekly - 3 sets - 10 reps   GOALS: Goals reviewed with patient? Yes  SHORT TERM GOALS: Target date: 09/11/2022  Patient will demonstrate 100% compliance with initial HEP with assistance of caregiver to continue to progress between physical therapy sessions.   Baseline: To be provided; demonstrates appropriate understanding during session with caregiver also demonstrating appropriate understanding/recommend reinforcement Goal status: IN PROGRESS  2. Refer patient to orthotist to assess if patient is appropriate candidate for LE bracing to ensure proper LE stability needed to improve safety with transfers.  Baseline: To be assessed; patient presents with ankle supination and hip abduction; ordered DAT scan to determine if  bone integrity appropriate for bracing to attempt standing (order placed on 09/05/2022) Goal status: IN PROGRESS  3.  Patient will perform rolling in B directions with mod A to improve functional mobility. Baseline: Max A, continues to be Max A per caregiver report unable to assess during session given patient request Goal status: NOT MET  4.  Patient will sit EOB with max A x 1 in preparation for transfers to improve mobility. Baseline: total; SBA-modA pending on task and fatigue during last few sessions Goal status: MET  5.  Patient will reduce score on MSIS-29 by 6% to demonstrate progress towards reduced impairment related to MS. Baseline: 86%, 85.5% (2/28) Goal status: IN PROGRESS   LONG TERM GOALS: Target date: 10/12/2022 (updated to match date of last appt within cert date)  Patient will report demonstrate independence with final HEP with assistance of caregiver in order to maintain current gains and continue to progress after physical therapy discharge.   Baseline: To be provided Goal status: INITIAL  2. Patient will demonstrate tolerance for standing in standing walker x 60" with max A from therapist for setup to progress tolerance for standing activity.  Baseline: Unable to attempt Goal status: INITIAL  3.  Patient will perform rolling in B directions with min A to improve functional mobility. Baseline: Max A Goal status: INITIAL  4.  Patient will sit EOB with mod A x 1 in preparation for transfers to improve mobility. Baseline: total Goal status: INITIAL  5.  Patient will reduce score on MSIS-29 by 20% to demonstrate progress towards reduced impairment related to MS. Baseline: 86% Goal status: INITIAL    ASSESSMENT:  CLINICAL IMPRESSION: Emphasis of skilled PT session on working on L ankle mobility and positioning of limbs in [place] and discussing PT POC and steps pt needs to take before returning to PT in order to safely work on standing after d/c next session. Pt  continues to exhibit B ankle PF contractures as well as increased inversion of her ankles, leading to decreased safety with attempts at standing. Additionally, pt needs a DEXA scan prior to standing as she has not stood for several years. Pt continues to benefit from skilled therapy services to work towards [place]. Continue POC.  OBJECTIVE IMPAIRMENTS: decreased balance, decreased mobility, difficulty walking, decreased ROM, decreased strength, increased edema, impaired tone, and impaired UE functional use.   ACTIVITY LIMITATIONS: standing, transfers, and bed mobility  PARTICIPATION LIMITATIONS: interpersonal relationship and community activity  PERSONAL FACTORS: Past/current experiences and Time since onset of injury/illness/exacerbation are also affecting patient's functional outcome.   REHAB POTENTIAL: Good  CLINICAL DECISION MAKING: Evolving/moderate complexity  EVALUATION COMPLEXITY: Moderate  PLAN:  PT FREQUENCY: 1x/week  PT DURATION: 8 weeks   PLANNED INTERVENTIONS: Therapeutic exercises, Therapeutic activity, Neuromuscular re-education, Balance training, Gait  training, Patient/Family education, Self Care, Joint mobilization, Orthotic/Fit training, and Wheelchair mobility training  PLAN FOR NEXT SESSION: Assess LTG and d/c from PT  Excell Seltzer, PT, DPT, CSRS  10/06/2022, 1:19 PM  Check all possible CPT codes: [id number] - PT Re-evaluation, 97110- Therapeutic Exercise, [phone]- Neuro Re-education, [phone] - Gait Training, [phone] - Manual Therapy, 97530 - Therapeutic Activities, and [phone] - Self Care    Check all conditions that are expected to impact treatment: Neurological condition and Social determinants of health   If treatment provided at initial evaluation, no treatment charged due to lack of authorization.

## 2022-10-12 ENCOUNTER — Ambulatory Visit: Payer: Medicare (Managed Care) | Admitting: Physical Therapy

## 2022-10-16 ENCOUNTER — Encounter: Payer: Self-pay | Admitting: Physical Therapy

## 2022-10-16 NOTE — Therapy (Signed)
[facility] [address] [place], [place], [address] Phone: [phone]   Fax:  [phone]  Patient Details  Name: LEEAN VAQUEZ MRN: [id number] Date of Birth: 1986/05/20 Referring Provider:  No ref. provider found  Encounter Date: 10/16/2022  Patient d/c from PT due to reaching end of POC and approved visits through her insurance. Unable to reassess LTG as pt did not attend last scheduled appointment due to issues with transportation. Pt given instructions at last attended appointment on steps she needs to take before following up with therapy. Pt will need a new referral to resume therapy services.   Excell Seltzer, PT, DPT, CSRS 10/16/2022, 9:43 AM  [facility] [address] [place], [place], [address] Phone: [phone]   Fax:  [phone]

## 2022-10-30 ENCOUNTER — Encounter: Payer: Self-pay | Admitting: Occupational Therapy

## 2022-10-30 NOTE — Therapy (Signed)
[facility] [address] [place], [place], [address] Phone: [phone]   Fax:  [phone]  Patient Details  Name: Lindsay Schneider MRN: [id number] Date of Birth: 10-10-1985 Referring Provider:  No ref. provider found  Encounter Date: 10/30/2022  Pt did not return after 4th visit on 09/25/22. Pt now outside POC. Will d/c episode of care at this time. Pt will need new referral to return. Pt did not meet any goals d/t not returning  Sheran Lawless, OT 10/30/2022, 12:02 PM  [facility] [address] [place], [place], [address] Phone: [phone]   Fax:  [phone]

## 2022-11-08 NOTE — Telephone Encounter (Signed)
Per previous note in epic from 10/2019: "First PA denied via Medicaid, no reason given. I re-submitted with further info. Second PA denied. I called [employer] to find out reasoning for denial. They state pt has part D plan and PA needs to be done via that plan, no card scanned in pt chart for this. Medicaid was unable to tell me what part D plan pt has. I called her Medicare plan and spoke with Patrice. She was unable to give me her Part D plan info, states I have to contact the pt directly for this.    I called [facility] and spoke with tech. States her Medicare Part D plan is through [employer]. ID: [id number]. RxBIN: [id number]. RXPCN: [id number]. RXGrp: [id number]. Phone#[phone]. I initiated PA on CMM. KeyMadolyn Frieze - PA Case ID: [id number]. Submitted PA and marked urgent. Determination pending with [facility] Medicare."   Note from 01/2022: Called Medicare part A [phone] and spoke w/ Peyton Najjar. States no PA needed for [facility] [phone]). Ref# for call [phone].    Last infusion 02/01/22 and next one 08/03/22. Last orders for [facility] placed 01/26/22.      Called [phone]. Spoke w/ Lanora Manis. States no PA needed for [facility] [phone]).   Faxed completed/signed prescriber service form back to [facility] access solutions at [phone]. Received fax confirmation.

## 2022-11-13 ENCOUNTER — Ambulatory Visit: Payer: Medicare Other | Admitting: Neurology

## 2022-11-27 ENCOUNTER — Encounter: Payer: Self-pay | Admitting: Neurology

## 2022-11-27 ENCOUNTER — Ambulatory Visit (INDEPENDENT_AMBULATORY_CARE_PROVIDER_SITE_OTHER): Payer: Medicare (Managed Care) | Admitting: Neurology

## 2022-11-27 VITALS — BP 142/96 | HR 93

## 2022-11-27 DIAGNOSIS — R252 Cramp and spasm: Secondary | ICD-10-CM | POA: Insufficient documentation

## 2022-11-27 DIAGNOSIS — G8114 Spastic hemiplegia affecting left nondominant side: Secondary | ICD-10-CM

## 2022-11-27 DIAGNOSIS — G35 Multiple sclerosis: Secondary | ICD-10-CM | POA: Diagnosis not present

## 2022-11-27 DIAGNOSIS — Z7952 Long term (current) use of systemic steroids: Secondary | ICD-10-CM

## 2022-11-27 DIAGNOSIS — R269 Unspecified abnormalities of gait and mobility: Secondary | ICD-10-CM | POA: Diagnosis not present

## 2022-11-27 DIAGNOSIS — Z993 Dependence on wheelchair: Secondary | ICD-10-CM

## 2022-11-27 DIAGNOSIS — Z79899 Other long term (current) drug therapy: Secondary | ICD-10-CM

## 2022-11-27 DIAGNOSIS — I10 Essential (primary) hypertension: Secondary | ICD-10-CM

## 2022-11-27 MED ORDER — HYDROCHLOROTHIAZIDE 25 MG PO TABS: 25.0000 mg | ORAL_TABLET | Freq: Every day | ORAL | 1 refills | Status: DC

## 2022-11-27 MED ORDER — BACLOFEN 20 MG PO TABS: 20.0000 mg | ORAL_TABLET | Freq: Three times a day (TID) | ORAL | 8 refills | Status: DC

## 2022-11-27 MED ORDER — GABAPENTIN 300 MG PO CAPS: 300.0000 mg | ORAL_CAPSULE | Freq: Three times a day (TID) | ORAL | 11 refills | Status: DC

## 2022-11-27 MED ORDER — DALFAMPRIDINE ER 10 MG PO TB12: 10.0000 mg | ORAL_TABLET | Freq: Two times a day (BID) | ORAL | 1 refills | Status: DC

## 2022-11-27 NOTE — Progress Notes (Signed)
[facility]  PATIENT: Lindsay Schneider DOB: 06/13/1986    _________________________________   HISTORICAL  CHIEF COMPLAINT:  Chief Complaint  Patient presents with   Room 11    Pt is here With her Caregiver. Pt states that things have been going good since her last appointment. Pt states that she still has the weakness in her legs and in her left arm. Pt states no burning or tingling in her legs.     HISTORY OF PRESENT ILLNESS:  Lindsay Schneider is a 37 y.o. woman who was diagnosed with MS in 2011 after presenting with left sided arm and leg numbness and weakness.    She has an active/relapsing secondary progressive MS.    Update 11/27/2022: She switched to Ocrevus from Tysabri in 2021 after she converted to JCV Ab positive at the end of 2020 (high positive at 3.89).   She tolerates Ocrevus well.    Her first infusion was May and June 2021    . Her next infusion will e in June.         She has bilateral foot pain.   Pain is burning sensation in her soles.     She also has pain in the back f her legs.     She is noting more muscle spasms, despite baclofen 10 mg po qid.     She has never been on gabapentin or lamotrigine. For dysesthesia.    She has a new electric wheelchair that is more adjustable..  She is wheelchair bound and needs help to transfer in/out of the wheelchair.   Her left arm and both legs are weak   Her left hand has gradually worsened.   Right leg has also weakened more and is now about the same as left.  .  The right arm is strong but has reduced coordination.  Handwriting is poor.   She has trouble keeping the left hand open.    She feels the spasticity has worsened over the last year.  She is on baclofen 10 mg po qid for the spasticity.   Tizanidine was poorly tolerated    She has intermittent numbness on the left side and both feet      She has urinary urgency ad rare urge  incontinence.      She has reduced VA and color vision OS.   She notes  visual changes more in bright sunlight.    She sleeps well at night and wakes up after 8 hours.      MS History:   In 2011, onset of left arm and leg numbness and weakness.  Her symptoms develop over 1 day. She had an MRI performed with lesions consistent with MS. She started to see Dr. Terrace Arabia here at [facility]. She was placed on Rebif. However, she stopped because her legs felt weaker when she was on it. She then transferred her care to Dr. Renne Crigler at [facility].     She was started on Tysabri.  She had several infusions of Tysabri but transportation was difficult to get to [facility]. Therefore, Dr. Renne Crigler switched her to Rituxan although she tolerated the medication well. She had a significant relapse with bilateral leg weakness within a couple weeks of the infusion. Therefore, 6 months later, she started Tysabri again. Due to difficulties with transportation, she transferred care to [place] in October 2016.  She converted to JCV Ab positive early 2021 She switched to Ocrevus.    Imaging: MRI of the brain, cervical  spine 01/13/2022 showed an unchanged distribution of white matter lesions consistent with MS.  There were no enhancing lesions.  MRI of the thoracic spine showed no demyelinating plaques.  No significant degenerative changes.  No spinal stenosis or nerve root compression.  MRI of the lumbar spine 01/13/2022 was unremarkable.  MRI of the brain 04/20/2021 showed T2/FLAIR hyperintense foci in the hemispheres, brainstem and cerebellum in a pattern and configuration consistent with chronic demyelinating plaque associated with multiple sclerosis.  None of the foci appear to be acute.  Compared to the MRI dated 06/23/2015, there do not appear to be any definite new lesions.  Generalized cortical atrophy that has progressed compared to the 2016 MRI  MRI of the cervical spine 04/20/2021 showed  Multiple discrete and confluent foci within the spinal cord and additional foci noted within the brainstem and cerebellum.   These are consistent with chronic demyelinating plaque associated with multiple sclerosis.  There is no definite change compared to the MRI dated 06/23/2015.   At C4-C5, there are disc osteophyte complexes causing borderline spinal stenosis and mild foraminal narrowing but no nerve root compression.  This has progressed compared to the 2016 MRI.  MRI of the brain 06/25/2015 shows a couple small T2/FLAIR hyperintense foci in the cerebellar hemispheres and other foci in the left middle cerebellar peduncle and pons..   The deep gray matter appears normal.  In the hemispheres, there are are multiple single and confluent T2/FLAIR hyperintense foci, predominantly in the periventricular white matter. Many of these foci are radially oriented to the ventricles.   MRI of the cervical spine 06/23/2015 shows multiple T2 hyperintense foci within the spinal cord. They are located posteriorly to the left adjacent to C2, posteriorly adjacent to C3, posterior to the right adjacent to C4, posterior to the right adjacent to C5, posterior to the right and central adjacent to C6.   None of these foci enhanced after contrast administration.  REVIEW OF SYSTEMS: Constitutional: No fevers, chills, sweats, or change in appetite.   She has fatigue and poor sleep.    Eyes: No visual changes, double vision, eye pain Ear, nose and throat: No hearing loss, ear pain, nasal congestion, sore throat Cardiovascular: No chest pain, palpitations Respiratory:  No shortness of breath at rest or with exertion.   No wheezes GastrointestinaI: No nausea, vomiting, diarrhea, abdominal pain, fecal incontinence Genitourinary:  Shehas urinary urgency and frequency with occ incontinence.    Hasnocturia. Musculoskeletal:  No neck pain, back pain Integumentary: No rash, pruritus, skin lesions Neurological: as above Psychiatric:   Some depression at this time.  No anxiety Endocrine: No palpitations, diaphoresis, change in appetite, change in weigh or  increased thirst Hematologic/Lymphatic:  No anemia, purpura, petechiae. Allergic/Immunologic: No itchy/runny eyes, nasal congestion, recent allergic reactions, rashes  ALLERGIES: No Known Allergies  HOME MEDICATIONS:  Current Outpatient Medications:    albuterol (PROVENTIL) (2.5 MG/3ML) 0.083% nebulizer solution, INHALE 3 ML BY NEBULIZATION EVERY 6 HOURS AS NEEDED FOR WHEEZING OR SHORTNESS OF BREATH, Disp: 75 mL, Rfl: 12   albuterol (VENTOLIN HFA) 108 (90 Base) MCG/ACT inhaler, Inhale 2 puffs into the lungs every 6 (six) hours as needed for wheezing or shortness of breath., Disp: 8 g, Rfl: 2   baclofen (LIORESAL) 20 MG tablet, Take 1 tablet (20 mg total) by mouth 3 (three) times daily., Disp: 90 tablet, Rfl: 8   dalfampridine 10 MG TB12, Take 1 tablet (10 mg total) by mouth 2 (two) times daily., Disp: 180 tablet, Rfl: 1  fluticasone-salmeterol (ADVAIR DISKUS) 250-50 MCG/ACT AEPB, Inhale 1 puffs into the lungs in the morning and at bedtime., Disp: 120 each, Rfl: 0   gabapentin (NEURONTIN) 300 MG capsule, Take 1 capsule (300 mg total) by mouth 3 (three) times daily., Disp: 90 capsule, Rfl: 11   hydrochlorothiazide (HYDRODIURIL) 25 MG tablet, TAKE 1 TABLET (25 MG TOTAL) BY MOUTH DAILY., Disp: 90 tablet, Rfl: 0   Vitamin D, Ergocalciferol, (DRISDOL) 1.25 MG (50000 UNIT) CAPS capsule, TAKE 1 CAPSULE (50,000 UNITS TOTAL) BY MOUTH EVERY 7 (SEVEN) DAYS, Disp: 13 capsule, Rfl: 2   acetaminophen (TYLENOL) 500 MG tablet, Take 1,000 mg by mouth every 6 (six) hours as needed for mild pain. (Patient not taking: Reported on 11/27/2022), Disp: , Rfl:    fexofenadine (ALLEGRA ALLERGY) 60 MG tablet, Take 1 tablet (60 mg total) by mouth 2 (two) times daily., Disp: 60 tablet, Rfl: 0   guaiFENesin-dextromethorphan (ROBITUSSIN DM) 100-10 MG/5ML syrup, Take 5 mLs by mouth every 4 (four) hours as needed for cough. (Patient not taking: Reported on 11/27/2022), Disp: 118 mL, Rfl: 0  PAST MEDICAL HISTORY: Past Medical  History:  Diagnosis Date   Bilateral leg weakness 04/27/2015   Left foot drop 02/08/2017   Movement disorder    MS (multiple sclerosis) (HCC)    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    PAST SURGICAL HISTORY: Past Surgical History:  Procedure Laterality Date   CESAREAN SECTION      FAMILY HISTORY: Family History  Problem Relation Age of Onset   Asthma Mother    Hypertension Father    Healthy Sister    Healthy Brother     SOCIAL HISTORY:  Social History   Socioeconomic History   Marital status: Single    Spouse name: Not on file   Number of children: Not on file   Years of education: Not on file   Highest education level: Not on file  Occupational History   Not on file  Tobacco Use   Smoking status: Never   Smokeless tobacco: Never  Vaping Use   Vaping Use: Never used  Substance and Sexual Activity   Alcohol use: No   Drug use: No   Sexual activity: Not Currently    Birth control/protection: Injection  Other Topics Concern   Not on file  Social History Narrative   Right Handed   1 Soda every now and then   Social Determinants of Health   Financial Resource Strain: Not on file  Food Insecurity: Not on file  Transportation Needs: Not on file  Physical Activity: Not on file  Stress: Not on file  Social Connections: Not on file  Intimate Partner Violence: Not on file     PHYSICAL EXAM  Vitals:   11/27/22 0909  BP: (!) 143/97  Pulse: (!) 104     There is no height or weight on file to calculate BMI.   General: The patient is well-developed and well-nourished and in no acute distress  Neurologic Exam  Mental status: The patient is alert and oriented x 3 at the time of the examination. The patient has apparent normal recent and remote memory, with an apparently normal attention span and concentration ability.   Speech is normal.  Cranial nerves: Extraocular movements are full.. Facial strength and sensation is normal.  Trapezius strength is normal.  The tongue is midline, and the patient has symmetric elevation of the soft palate. No obvious hearing deficits are noted.  Motor: Muscle bulk is normal.  Muscle tone is greatly increased in the left arm and both legs, left greater than right.   Right arm has good tone/strength but reduced RAM.  Strength is 2-/5 extension and 2+/5 flexion in left amr. . Strength is now 1  in proximal right and left leg 2- right quad but 1 elsewhere,    Sensory: she has intact sensation to touch and vibration in the arms.  Normal touch/temperature sensation in the legs but reduced vibration sensation in the left leg.  Coordination: She has mildly reduced finger-nose-finger on the right.  She is unable to do on the left.  She is too weak for heel-to-shin either side  Gait and station: She is unable to stand or walk.  Reflexes: Deep tendon reflexes are increased in arms, left > right and in legs with spread att knees and sustained clonus at the left ankle and nonsustained at the right.       DIAGNOSTIC DATA (LABS, IMAGING, TESTING) - I reviewed patient records, labs, notes, testing and imaging myself where available.  Lab Results  Component Value Date   WBC 9.5 02/02/2022   HGB 11.6 02/02/2022   HCT 35.9 02/02/2022   MCV 82 02/02/2022   PLT 264 02/02/2022      Component Value Date/Time   NA 140 02/02/2022 1659   K 4.0 02/02/2022 1659   CL 103 02/02/2022 1659   CO2 22 02/02/2022 1659   GLUCOSE 104 (H) 02/02/2022 1659   GLUCOSE 112 (H) 01/13/2022 0148   BUN 8 02/02/2022 1659   CREATININE 0.66 02/02/2022 1659   CALCIUM 9.1 02/02/2022 1659   PROT 6.7 02/02/2022 1659   ALBUMIN 4.1 02/02/2022 1659   AST 16 02/02/2022 1659   ALT 28 02/02/2022 1659   ALKPHOS 81 02/02/2022 1659   BILITOT 0.2 02/02/2022 1659   GFRNONAA >60 01/13/2022 0148   GFRAA 144 05/21/2019 1057       ASSESSMENT AND PLAN  Wheelchair dependent  Multiple sclerosis (HCC)  Gait  disturbance  Spasticity  Current use of steroid medication  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    She has an active form of secondary progressive MS.  Continue Ocrevus.  Check IgG/IgM, CBC with differential and CMP.  2.   Increase baclofen up to 60 mg a day and continue lorazepam nightly to help with her insomnia and nighttime spasticity..  Add gabapentin for dysesthetic pain  3.    Continue HCTZ to 25 mg for hypertension and pedal edema.  Check CMP and add potassium if low 4.     Return in 6 months or sooner if there are new or worsening neurologic symptoms.   This visit is part of a comprehensive longitudinal care medical relationship regarding the patients primary diagnosis of MS and related concerns.   Lindsay Schneider A. Epimenio Foot, MD, PhD 11/27/2022, 10:01 AM Certified in Neurology, Clinical Neurophysiology, Sleep Medicine, Pain Medicine and Neuroimaging  [facility] [address], [address] [place], [place] [address] [phone]

## 2022-11-28 ENCOUNTER — Encounter: Payer: Self-pay | Admitting: Neurology

## 2022-11-28 ENCOUNTER — Other Ambulatory Visit: Payer: Self-pay | Admitting: *Deleted

## 2022-11-28 DIAGNOSIS — I10 Essential (primary) hypertension: Secondary | ICD-10-CM

## 2022-11-28 LAB — COMPREHENSIVE METABOLIC PANEL
ALT: 39 IU/L — ABNORMAL HIGH (ref 0–32)
AST: 23 IU/L (ref 0–40)
Albumin/Globulin Ratio: 1.7 (ref 1.2–2.2)
Albumin: 4.2 g/dL (ref 3.9–4.9)
Alkaline Phosphatase: 114 IU/L (ref 44–121)
BUN/Creatinine Ratio: 9 (ref 9–23)
BUN: 7 mg/dL (ref 6–20)
Bilirubin Total: 0.4 mg/dL (ref 0.0–1.2)
CO2: 25 mmol/L (ref 20–29)
Calcium: 9.7 mg/dL (ref 8.7–10.2)
Chloride: 98 mmol/L (ref 96–106)
Creatinine, Ser: 0.82 mg/dL (ref 0.57–1.00)
Globulin, Total: 2.5 g/dL (ref 1.5–4.5)
Glucose: 261 mg/dL — ABNORMAL HIGH (ref 70–99)
Potassium: 3.9 mmol/L (ref 3.5–5.2)
Sodium: 137 mmol/L (ref 134–144)
Total Protein: 6.7 g/dL (ref 6.0–8.5)
eGFR: 96 mL/min/{1.73_m2} (ref >59)

## 2022-11-28 LAB — CBC WITH DIFFERENTIAL/PLATELET
Basophils Absolute: 0 10*3/uL (ref 0.0–0.2)
Basos: 0 %
EOS (ABSOLUTE): 0.8 10*3/uL — ABNORMAL HIGH (ref 0.0–0.4)
Eos: 10 %
Hematocrit: 38.4 % (ref 34.0–46.6)
Hemoglobin: 12.3 g/dL (ref 11.1–15.9)
Immature Grans (Abs): 0 10*3/uL (ref 0.0–0.1)
Immature Granulocytes: 0 %
Lymphocytes Absolute: 1.6 10*3/uL (ref 0.7–3.1)
Lymphs: 18 %
MCH: 26.4 pg — ABNORMAL LOW (ref 26.6–33.0)
MCHC: 32 g/dL (ref 31.5–35.7)
MCV: 82 fL (ref 79–97)
Monocytes Absolute: 0.6 10*3/uL (ref 0.1–0.9)
Monocytes: 7 %
Neutrophils Absolute: 5.7 10*3/uL (ref 1.4–7.0)
Neutrophils: 65 %
Platelets: 262 10*3/uL (ref 150–450)
RBC: 4.66 x10E6/uL (ref 3.77–5.28)
RDW: 16.4 % — ABNORMAL HIGH (ref 11.7–15.4)
WBC: 8.7 10*3/uL (ref 3.4–10.8)

## 2022-11-28 LAB — IGG, IGA, IGM
IgA/Immunoglobulin A, Serum: 308 mg/dL (ref 87–352)
IgG (Immunoglobin G), Serum: 1090 mg/dL (ref 586–1602)
IgM (Immunoglobulin M), Srm: 28 mg/dL (ref 26–217)

## 2022-11-28 MED ORDER — HYDROCHLOROTHIAZIDE 25 MG PO TABS: 25.0000 mg | ORAL_TABLET | Freq: Every day | ORAL | 1 refills | Status: DC

## 2022-11-28 NOTE — Telephone Encounter (Signed)
Dr.Sater are you willing to write Rx for patient? She was seen yesterday.

## 2022-11-29 ENCOUNTER — Other Ambulatory Visit: Payer: Self-pay | Admitting: *Deleted

## 2022-11-29 DIAGNOSIS — Z0130 Encounter for examination of blood pressure without abnormal findings: Secondary | ICD-10-CM

## 2022-12-05 ENCOUNTER — Other Ambulatory Visit: Payer: Self-pay | Admitting: *Deleted

## 2022-12-05 DIAGNOSIS — G35 Multiple sclerosis: Secondary | ICD-10-CM

## 2022-12-05 DIAGNOSIS — R269 Unspecified abnormalities of gait and mobility: Secondary | ICD-10-CM

## 2022-12-05 MED ORDER — DALFAMPRIDINE ER 10 MG PO TB12: 10.0000 mg | ORAL_TABLET | Freq: Two times a day (BID) | ORAL | 1 refills | Status: DC

## 2022-12-06 NOTE — Telephone Encounter (Signed)
Called pt. She would like prescription emailed to her at [email or web address]. I sent. Asked her to let us know if she does not receive. She verbalized understanding and appreciation.

## 2023-01-02 ENCOUNTER — Other Ambulatory Visit (HOSPITAL_COMMUNITY): Payer: Self-pay

## 2023-01-02 ENCOUNTER — Telehealth: Payer: Self-pay

## 2023-01-02 NOTE — Telephone Encounter (Signed)
Pharmacy Patient Advocate Encounter   Received notification from [facility] that prior authorization for Dalfampridine ER 10MG  er tablets is required/requested.   PA submitted to [facility] Medicare via CoverMyMeds [employer] or [facility]) confirmation # [id number] Status is pending

## 2023-01-05 ENCOUNTER — Other Ambulatory Visit: Payer: Self-pay | Admitting: Neurology

## 2023-01-05 ENCOUNTER — Other Ambulatory Visit: Payer: Self-pay | Admitting: Student

## 2023-01-05 NOTE — Telephone Encounter (Signed)
Received a request for additional information-faxed completed form to [phone].

## 2023-01-08 NOTE — Telephone Encounter (Signed)
Last seen on 11/27/22  Follow up scheduled on 06/18/23   Dr.Sater I didn't see in the note about patient continuing vitamin D Rx. Last lab vitamin level checked was 2022?

## 2023-01-09 ENCOUNTER — Other Ambulatory Visit (HOSPITAL_COMMUNITY): Payer: Self-pay

## 2023-01-09 NOTE — Telephone Encounter (Signed)
Pharmacy Patient Advocate Encounter  Prior Authorization for Dalfampridine ER 10MG  er tablets has been APPROVED by [facility] from 01/04/2023 to Until further notice.   PA # [id number]  Copay is $0 for 90DS/180 Tablets per [facility] test claim.  Faxed the approval letter to [facility] at [phone]  I emailed Heidi with [facility] and let her know the PA was approved and emailed a copy of the approval letter to her as well.

## 2023-01-29 ENCOUNTER — Other Ambulatory Visit: Payer: Self-pay | Admitting: *Deleted

## 2023-01-29 ENCOUNTER — Telehealth: Payer: Self-pay | Admitting: *Deleted

## 2023-01-29 DIAGNOSIS — G35 Multiple sclerosis: Secondary | ICD-10-CM

## 2023-01-29 MED ORDER — DIPHENHYDRAMINE HCL 50 MG/ML IJ SOLN: 50.0000 mg | Freq: Once | INTRAMUSCULAR | Status: DC

## 2023-01-29 MED ORDER — SODIUM CHLORIDE 0.9 % IV SOLN: 125.0000 mg | Freq: Once | INTRAVENOUS | Status: DC

## 2023-01-29 MED ORDER — ACETAMINOPHEN 325 MG PO TABS: 650.0000 mg | ORAL_TABLET | Freq: Once | ORAL | Status: DC

## 2023-01-29 MED ORDER — SODIUM CHLORIDE 0.9 % IV SOLN: 20.0000 mg | Freq: Once | INTRAVENOUS | Status: DC

## 2023-01-29 NOTE — Telephone Encounter (Signed)
Orders placed in EPIC. Sent message to infusion.

## 2023-01-29 NOTE — Telephone Encounter (Signed)
Needs order for her ocrevus placed.

## 2023-01-31 ENCOUNTER — Other Ambulatory Visit: Payer: Self-pay | Admitting: *Deleted

## 2023-01-31 ENCOUNTER — Other Ambulatory Visit: Payer: Self-pay | Admitting: Neurology

## 2023-01-31 DIAGNOSIS — G35 Multiple sclerosis: Secondary | ICD-10-CM

## 2023-01-31 DIAGNOSIS — Z79899 Other long term (current) drug therapy: Secondary | ICD-10-CM

## 2023-01-31 DIAGNOSIS — R269 Unspecified abnormalities of gait and mobility: Secondary | ICD-10-CM

## 2023-01-31 MED ORDER — SODIUM CHLORIDE 0.9 % IV SOLN: 600.0000 mg | Freq: Once | INTRAVENOUS | Status: AC

## 2023-01-31 MED ORDER — ACETAMINOPHEN 325 MG PO TABS: 650.0000 mg | ORAL_TABLET | Freq: Once | ORAL | Status: DC

## 2023-01-31 MED ORDER — SODIUM CHLORIDE 0.9 % IV SOLN: 125.0000 mg | Freq: Once | INTRAVENOUS | Status: DC

## 2023-01-31 MED ORDER — SODIUM CHLORIDE 0.9 % IV SOLN: 20.0000 mg | Freq: Once | INTRAVENOUS | Status: DC

## 2023-01-31 MED ORDER — DIPHENHYDRAMINE HCL 50 MG/ML IJ SOLN: 50.0000 mg | Freq: Once | INTRAMUSCULAR | Status: DC

## 2023-02-01 ENCOUNTER — Non-Acute Institutional Stay (HOSPITAL_COMMUNITY)
Admission: RE | Admit: 2023-02-01 | Discharge: 2023-02-01 | Disposition: A | Discharge status: Home / Self Care | Payer: Medicare (Managed Care) | Source: Ambulatory Visit | Attending: Internal Medicine | Admitting: Internal Medicine

## 2023-02-01 DIAGNOSIS — G35 Multiple sclerosis: Secondary | ICD-10-CM | POA: Diagnosis not present

## 2023-02-01 MED ORDER — DIPHENHYDRAMINE HCL 50 MG/ML IJ SOLN
50.0000 mg | Freq: Once | INTRAMUSCULAR | Status: AC
  Administered 2023-02-01: 50 mg via INTRAVENOUS
  Filled 2023-02-01: qty 1

## 2023-02-01 MED ORDER — SODIUM CHLORIDE 0.9 % IV SOLN
600.0000 mg | Freq: Once | INTRAVENOUS | Status: AC
  Administered 2023-02-01: 600 mg via INTRAVENOUS
  Filled 2023-02-01: qty 20

## 2023-02-01 MED ORDER — SODIUM CHLORIDE 0.9 % IV SOLN: INTRAVENOUS | Status: DC | PRN

## 2023-02-01 MED ORDER — FAMOTIDINE IN NACL 20-0.9 MG/50ML-% IV SOLN
20.0000 mg | Freq: Once | INTRAVENOUS | Status: AC
  Administered 2023-02-01: 20 mg via INTRAVENOUS
  Filled 2023-02-01: qty 50

## 2023-02-01 MED ORDER — ACETAMINOPHEN 325 MG PO TABS
650.0000 mg | ORAL_TABLET | Freq: Once | ORAL | Status: AC
  Administered 2023-02-01: 650 mg via ORAL
  Filled 2023-02-01: qty 2

## 2023-02-01 MED ORDER — METHYLPREDNISOLONE SODIUM SUCC 125 MG IJ SOLR
125.0000 mg | Freq: Once | INTRAMUSCULAR | Status: AC
  Administered 2023-02-01: 125 mg via INTRAVENOUS
  Filled 2023-02-01: qty 2

## 2023-02-01 NOTE — Progress Notes (Signed)
PATIENT CARE CENTER NOTE     Diagnosis: Multiple sclerosis (HCC) [G35]      Provider: Despina Arias, MD     Procedure: Emogene Morgan infusion      Note: Patient received Ocrevus 600 mg infusion via PIV. Patient given pre-meds (Tylenol, IV Benadryl, IV Solu-medrol, Pepcid IVPB) per order. Infusion titrated per protocol. Patient tolerated well with no adverse reaction. Patient declined to wait for the 1 hour post infusion observation. Discharge instructions given. Patient to come back in 6 months. Vital signs stable. Patient alert, oriented and transfers in personal motorized wheel chair at discharge.  Patient discharged home with family member.

## 2023-04-26 ENCOUNTER — Telehealth: Payer: Self-pay | Admitting: *Deleted

## 2023-04-26 NOTE — Telephone Encounter (Signed)
[place] transportation service with [employer] application filled out and signed by Dr.Sater and given to Stanton Kidney in medical records.   Copy made as well.

## 2023-06-04 ENCOUNTER — Other Ambulatory Visit: Payer: Self-pay | Admitting: Neurology

## 2023-06-04 DIAGNOSIS — G35 Multiple sclerosis: Secondary | ICD-10-CM

## 2023-06-04 DIAGNOSIS — R269 Unspecified abnormalities of gait and mobility: Secondary | ICD-10-CM

## 2023-06-04 NOTE — Telephone Encounter (Signed)
Last seen on 11/27/22 Follow up scheduled on 06/18/23

## 2023-06-13 NOTE — Progress Notes (Deleted)
PATIENT: Lindsay Schneider DOB: [birth year]  REASON FOR VISIT: follow up HISTORY FROM: patient  No chief complaint on file.    HISTORY OF PRESENT ILLNESS:  06/13/23 ALL: Lindsay Schneider is a 37 y.o. female here today for follow up for active/relapsing secondary progressive MS. She was switched to Ocrevus infusions following positive JCV. She is tolerating infusions well. Last infusion in July. She has appt scheduled for next infusion with [facility] tomorrow. MRI brain, cervical, thoracic and lumbar spine were stable 12/2021, no new lesions.   She reports that MS symptoms are stable.  She continues to have left-sided weakness.  She is unable to use her left side and right lower ext. She is wheelchair-bound.  She requires assistance with transfers. Family helps with this. She is unable to walk. She continues baclofen 10-20mg  QID for spasticity as well as Ampyra for stiffness. She has not taken tizanidine recently. She didn't know she had refills of this medication. She has never taken methocarbamol. She   Mood is good. No worsening depression or anxiety. Sleep is fair. She has not taken Ativan recently in quite sometime. She is taking phentermine 37.5mg . She does not feel it helps with energy as much as it used to but she feels it helps to keep her bowel moving regularly.   She also continues Ditropan for overactive bladder.  No incontinence. She has family help her transfer to toilet.   Vitamin D was low. She takes 50000iu weekly. Dr Epimenio Foot increased HCTZ to 25mg  daily and advised follow up with PCP for increased BP. BP has been normal at home but she is unable to remember any specific readings. She does not have a primary care provider.   HISTORY: (copied from Dr Bonnita Hollow previous note)  Lindsay Schneider is a 37 y.o. woman who was diagnosed with MS in 2011 after presenting with left sided arm and leg numbness and weakness.    She has an active/relapsing secondary progressive MS.     Update  11/27/2022: She switched to Ocrevus from Tysabri in 2021 after she converted to JCV Ab positive at the end of 2020 (high positive at 3.89).   She tolerates Ocrevus well.    Her first infusion was May and June 2021. Her next infusion will e in June.          She has bilateral foot pain.   Pain is burning sensation in her soles.     She also has pain in the back f her legs.     She is noting more muscle spasms, despite baclofen 10 mg po qid.     She has never been on gabapentin or lamotrigine. For dysesthesia.     She has a new electric wheelchair that is more adjustable..  She is wheelchair bound and needs help to transfer in/out of the wheelchair.   Her left arm and both legs are weak   Her left hand has gradually worsened.   Right leg has also weakened more and is now about the same as left.  .  The right arm is strong but has reduced coordination.  Handwriting is poor.   She has trouble keeping the left hand open.    She feels the spasticity has worsened over the last year.  She is on baclofen 10 mg po qid for the spasticity.   Tizanidine was poorly tolerated    She has intermittent numbness on the left side and both feet  She has urinary urgency ad rare urge  incontinence.       She has reduced VA and color vision OS.   She notes visual changes more in bright sunlight.     She sleeps well at night and wakes up after 8 hours.       MS History:   In 2011, onset of left arm and leg numbness and weakness.  Her symptoms develop over 1 day. She had an MRI performed with lesions consistent with MS. She started to see Dr. Terrace Arabia here at [facility]. She was placed on Rebif. However, she stopped because her legs felt weaker when she was on it. She then transferred her care to Dr. Renne Crigler at [facility].     She was started on Tysabri.  She had several infusions of Tysabri but transportation was difficult to get to [facility]. Therefore, Dr. Renne Crigler switched her to Rituxan although she tolerated the medication well. She had  a significant relapse with bilateral leg weakness within a couple weeks of the infusion. Therefore, 6 months later, she started Tysabri again. Due to difficulties with transportation, she transferred care to [place] in October 2016.  She converted to JCV Ab positive early 2021 She switched to Ocrevus.     Imaging: MRI of the brain, cervical spine 01/13/2022 showed an unchanged distribution of white matter lesions consistent with MS.  There were no enhancing lesions.  MRI of the thoracic spine showed no demyelinating plaques.  No significant degenerative changes.  No spinal stenosis or nerve root compression.   MRI of the lumbar spine 01/13/2022 was unremarkable.   MRI of the brain 04/20/2021 showed T2/FLAIR hyperintense foci in the hemispheres, brainstem and cerebellum in a pattern and configuration consistent with chronic demyelinating plaque associated with multiple sclerosis.  None of the foci appear to be acute.  Compared to the MRI dated 06/23/2015, there do not appear to be any definite new lesions.  Generalized cortical atrophy that has progressed compared to the 2016 MRI   MRI of the cervical spine 04/20/2021 showed  Multiple discrete and confluent foci within the spinal cord and additional foci noted within the brainstem and cerebellum.  These are consistent with chronic demyelinating plaque associated with multiple sclerosis.  There is no definite change compared to the MRI dated 06/23/2015.   At C4-C5, there are disc osteophyte complexes causing borderline spinal stenosis and mild foraminal narrowing but no nerve root compression.  This has progressed compared to the 2016 MRI.   MRI of the brain 06/25/2015 shows a couple small T2/FLAIR hyperintense foci in the cerebellar hemispheres and other foci in the left middle cerebellar peduncle and pons..   The deep gray matter appears normal.  In the hemispheres, there are are multiple single and confluent T2/FLAIR hyperintense foci, predominantly in the  periventricular white matter. Many of these foci are radially oriented to the ventricles.    MRI of the cervical spine 06/23/2015 shows multiple T2 hyperintense foci within the spinal cord. They are located posteriorly to the left adjacent to C2, posteriorly adjacent to C3, posterior to the right adjacent to C4, posterior to the right adjacent to C5, posterior to the right and central adjacent to C6.   None of these foci enhanced after contrast administration.    REVIEW OF SYSTEMS: Out of a complete 14 system review of symptoms, the patient complains only of the following symptoms, weakness, numbness, spacticity, frequent urination and all other reviewed systems are negative.  ALLERGIES: No Known Allergies  HOME MEDICATIONS: Outpatient Medications Prior to Visit  Medication Sig Dispense Refill   acetaminophen (TYLENOL) 500 MG tablet Take 1,000 mg by mouth every 6 (six) hours as needed for mild pain. (Patient not taking: Reported on 11/27/2022)     albuterol (PROVENTIL) (2.5 MG/3ML) 0.083% nebulizer solution INHALE 3 ML BY NEBULIZATION EVERY 6 HOURS AS NEEDED FOR WHEEZING OR SHORTNESS OF BREATH 75 mL 12   albuterol (VENTOLIN HFA) 108 (90 Base) MCG/ACT inhaler Inhale 2 puffs into the lungs every 6 (six) hours as needed for wheezing or shortness of breath. 8 g 2   baclofen (LIORESAL) 20 MG tablet Take 1 tablet (20 mg total) by mouth 3 (three) times daily. 90 tablet 8   dalfampridine 10 MG TB12 TAKE 1 TABLET TWICE A DAY 180 tablet 1   fexofenadine (ALLEGRA ALLERGY) 60 MG tablet Take 1 tablet (60 mg total) by mouth 2 (two) times daily. 60 tablet 0   fluticasone-salmeterol (ADVAIR DISKUS) 250-50 MCG/ACT AEPB Inhale 1 puffs into the lungs in the morning and at bedtime. 120 each 0   gabapentin (NEURONTIN) 300 MG capsule Take 1 capsule (300 mg total) by mouth 3 (three) times daily. 90 capsule 11   guaiFENesin-dextromethorphan (ROBITUSSIN DM) 100-10 MG/5ML syrup Take 5 mLs by mouth every 4 (four) hours as  needed for cough. (Patient not taking: Reported on 11/27/2022) 118 mL 0   hydrochlorothiazide (HYDRODIURIL) 25 MG tablet Take 1 tablet (25 mg total) by mouth daily. 90 tablet 1   Vitamin D, Ergocalciferol, (DRISDOL) 1.25 MG (50000 UNIT) CAPS capsule TAKE 1 CAPSULE (50,000 UNITS TOTAL) BY MOUTH EVERY 7 (SEVEN) DAYS 13 capsule 2   Facility-Administered Medications Prior to Visit  Medication Dose Route Frequency Provider Last Rate Last Admin   acetaminophen (TYLENOL) tablet 650 mg  650 mg Oral Once Sater, Pearletha Furl, MD       acetaminophen (TYLENOL) tablet 650 mg  650 mg Oral Once Sater, Pearletha Furl, MD       diphenhydrAMINE (BENADRYL) injection 50 mg  50 mg Intravenous Once Sater, Pearletha Furl, MD       diphenhydrAMINE (BENADRYL) injection 50 mg  50 mg Intravenous Once Sater, Pearletha Furl, MD       famotidine (PEPCID) 20 mg in sodium chloride 0.9 % 50 mL IVPB  20 mg Intravenous Once Sater, Richard A, MD       famotidine (PEPCID) 20 mg in sodium chloride 0.9 % 50 mL IVPB  20 mg Intravenous Once Sater, Richard A, MD       methylPREDNISolone sodium succinate (SOLU-MEDROL) 125 mg in sodium chloride 0.9 % 50 mL IVPB  125 mg Intravenous Once Sater, Richard A, MD       methylPREDNISolone sodium succinate (SOLU-MEDROL) 125 mg in sodium chloride 0.9 % 50 mL IVPB  125 mg Intravenous Once Sater, Richard A, MD       ocrelizumab (OCREVUS) 600 mg in sodium chloride 0.9 % 250 mL  600 mg Intravenous Once Asa Lente, MD        PAST MEDICAL HISTORY: Past Medical History:  Diagnosis Date   Bilateral leg weakness 04/27/2015   Left foot drop 02/08/2017   Movement disorder    MS (multiple sclerosis) (HCC)    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    PAST SURGICAL HISTORY: Past Surgical History:  Procedure Laterality Date   CESAREAN SECTION      FAMILY HISTORY: Family History  Problem Relation Age of Onset   Asthma Mother  Hypertension Father    Healthy Sister    Healthy Brother      SOCIAL HISTORY: Social History   Socioeconomic History   Marital status: Single    Spouse name: Not on file   Number of children: Not on file   Years of education: Not on file   Highest education level: Not on file  Occupational History   Not on file  Tobacco Use   Smoking status: Never   Smokeless tobacco: Never  Vaping Use   Vaping status: Never Used  Substance and Sexual Activity   Alcohol use: No   Drug use: No   Sexual activity: Not Currently    Birth control/protection: Injection  Other Topics Concern   Not on file  Social History Narrative   Right Handed   1 Soda every now and then   Social Determinants of Health   Financial Resource Strain: Not on file  Food Insecurity: Not on file  Transportation Needs: Not on file  Physical Activity: Not on file  Stress: Not on file  Social Connections: Not on file  Intimate Partner Violence: Not on file      PHYSICAL EXAM  There were no vitals filed for this visit.   There is no height or weight on file to calculate BMI.  Generalized: Well developed, in no acute distress  Cardiology: normal rate and rhythm, no murmur noted Neurological examination  Mentation: Alert oriented to time, place, history taking. Follows all commands speech and language fluent Cranial nerve II-XII: Pupils were equal round reactive to light. Extraocular movements were full, visual field were full on confrontational test. Facial sensation and strength were normal. Uvula tongue midline. Head turning and shoulder shrug  were normal and symmetric. Motor: The motor testing reveals 5 over 5 strength of right upper extremity,3/5 of left upper flexion, 2/5 left upper extension, 2/5 bilateral lower extremities. Good symmetric motor tone is noted throughout.  Sensory: Sensory testing is intact to soft touch on all 4 extremities. No evidence of extinction is noted.  Coordination: Cerebellar testing reveals good finger-nose-finger with right hand,  unable to perform with left hand and bilateral lower extremities.   Gait and station: Unable to ambulate, patient is in wheelchair today. Reflexes: Deep tendon reflexes are diminished in bilateral lower patellar   DIAGNOSTIC DATA (LABS, IMAGING, TESTING) - I reviewed patient records, labs, notes, testing and imaging myself where available.      No data to display           Lab Results  Component Value Date   WBC 8.7 11/27/2022   HGB 12.3 11/27/2022   HCT 38.4 11/27/2022   MCV 82 11/27/2022   PLT 262 11/27/2022      Component Value Date/Time   NA 137 11/27/2022 1017   K 3.9 11/27/2022 1017   CL 98 11/27/2022 1017   CO2 25 11/27/2022 1017   GLUCOSE 261 (H) 11/27/2022 1017   GLUCOSE 112 (H) 01/13/2022 0148   BUN 7 11/27/2022 1017   CREATININE 0.82 11/27/2022 1017   CALCIUM 9.7 11/27/2022 1017   PROT 6.7 11/27/2022 1017   ALBUMIN 4.2 11/27/2022 1017   AST 23 11/27/2022 1017   ALT 39 (H) 11/27/2022 1017   ALKPHOS 114 11/27/2022 1017   BILITOT 0.4 11/27/2022 1017   GFRNONAA >60 01/13/2022 0148   GFRAA 144 05/21/2019 1057   No results found for: "CHOL", "HDL", "LDLCALC", "LDLDIRECT", "TRIG", "CHOLHDL" No results found for: "HGBA1C" No results found for: "VITAMINB12" Lab Results  Component Value Date   TSH 3.160 02/02/2022       ASSESSMENT AND PLAN 37 y.o. 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 here with   No diagnosis found.    Deanne feels that MS symptoms are fairly stable. She will continue Ocrevus infusions every 6 months. I will update labs today.  We will anticipate replacing vitamin D as needed.  She will continue baclofen 10-20mg  and may take up to 4 times daily. She may also start tizanidine 4mg  daily and titrate to 4mg  TID if well tolerated. Continue Ampyra 10mg   BID. Continue phentermine 37.5mg  daily. Monitor BP closely. I have placed a referral to PCP to establish care.  I would also like for her to participate in physical therapy for spasticity and hemiplegia.  HH order placed. She is wheelchair bound and has family assisting with transfers. I would like for her to follow-up in 4 to 6 months with [name].  She verbalizes understanding and agreement with this plan.   No orders of the defined types were placed in this encounter.    No orders of the defined types were placed in this encounter.    Shawnie Dapper, FNP-C 06/13/2023, 12:44 PM [facility] [address], [address] [place], [place] [address] [phone]

## 2023-06-18 ENCOUNTER — Ambulatory Visit: Payer: Medicare (Managed Care) | Admitting: Family Medicine

## 2023-06-18 DIAGNOSIS — G35 Multiple sclerosis: Secondary | ICD-10-CM

## 2023-06-18 DIAGNOSIS — Z993 Dependence on wheelchair: Secondary | ICD-10-CM

## 2023-06-18 DIAGNOSIS — Z79899 Other long term (current) drug therapy: Secondary | ICD-10-CM

## 2023-06-18 DIAGNOSIS — R252 Cramp and spasm: Secondary | ICD-10-CM

## 2023-06-18 DIAGNOSIS — G8114 Spastic hemiplegia affecting left nondominant side: Secondary | ICD-10-CM

## 2023-06-18 DIAGNOSIS — R269 Unspecified abnormalities of gait and mobility: Secondary | ICD-10-CM

## 2023-06-19 ENCOUNTER — Telehealth: Payer: Self-pay

## 2023-06-19 NOTE — Telephone Encounter (Signed)
Gave [place] Application to [facility] in Medical Records on 06/19/2023

## 2023-06-28 ENCOUNTER — Inpatient Hospital Stay: Admission: RE | Admit: 2023-06-28 | Payer: Medicare (Managed Care) | Source: Ambulatory Visit

## 2023-07-03 NOTE — Patient Instructions (Signed)

## 2023-07-03 NOTE — Progress Notes (Unsigned)
PATIENT: Lindsay Schneider DOB: 1986/01/23  REASON FOR VISIT: follow up HISTORY FROM: patient  No chief complaint on file.    HISTORY OF PRESENT ILLNESS:  07/03/23 Lindsay Schneider is a 37 y.o. female here today for follow up for active/relapsing secondary progressive MS. She was switched to Ocrevus infusions following positive JCV. She is tolerating infusions well. Last infusion in July. She has appt scheduled for next infusion with [facility] tomorrow. MRI brain, cervical, thoracic and lumbar spine were stable 12/2021, no new lesions.   She reports that MS symptoms are stable.  She continues to have left-sided weakness.  She is unable to use her left side and right lower ext. She is wheelchair-bound.  She requires assistance with transfers. Family helps with this. She is unable to walk. She continues baclofen 10-20mg  QID for spasticity as well as dalfampridine for stiffness. Gabapentin 300mg  TID helps with dysestetic pain.  Mood is good. No worsening depression or anxiety. Sleep is fair. She has not taken Ativan??? recently in quite sometime. She is taking phentermine 37.5mg . She does not feel it helps with energy as much as it used to but she feels it helps to keep her bowel moving regularly.   She also continues Ditropan for overactive bladder.  No incontinence. She has family help her transfer to toilet.   Vitamin D was low. She takes 50000iu weekly. She continues HCTZ 25mg  daily. BP has been normal at home but she is unable to remember any specific readings. She does not have a primary care provider.   HISTORY: (copied from Lindsay Schneider previous note)  Lindsay Schneider is a 37 y.o. woman who was diagnosed with MS in 2011 after presenting with left sided arm and leg numbness and weakness.    She has an active/relapsing secondary progressive MS.     Update 11/27/2022: She switched to Ocrevus from Tysabri in 2021 after she converted to JCV Ab positive at the end of 2020 (high positive at  3.89).   She tolerates Ocrevus well.    Her first infusion was May and June 2021. Her next infusion will e in June.          She has bilateral foot pain.   Pain is burning sensation in her soles.     She also has pain in the back f her legs.     She is noting more muscle spasms, despite baclofen 10 mg po qid.     She has never been on gabapentin or lamotrigine. For dysesthesia.     She has a new electric wheelchair that is more adjustable..  She is wheelchair bound and needs help to transfer in/out of the wheelchair.   Her left arm and both legs are weak   Her left hand has gradually worsened.   Right leg has also weakened more and is now about the same as left.  .  The right arm is strong but has reduced coordination.  Handwriting is poor.   She has trouble keeping the left hand open.    She feels the spasticity has worsened over the last year.  She is on baclofen 10 mg po qid for the spasticity.   Tizanidine was poorly tolerated    She has intermittent numbness on the left side and both feet       She has urinary urgency ad rare urge  incontinence.       She has reduced VA and color vision OS.   She  notes visual changes more in bright sunlight.     She sleeps well at night and wakes up after 8 hours.       MS History:   In 2011, onset of left arm and leg numbness and weakness.  Her symptoms develop over 1 day. She had an MRI performed with lesions consistent with MS. She started to see Lindsay Schneider here at [facility]. She was placed on Rebif. However, she stopped because her legs felt weaker when she was on it. She then transferred her care to Lindsay Schneider at [facility].     She was started on Tysabri.  She had several infusions of Tysabri but transportation was difficult to get to [facility]. Therefore, Lindsay Schneider switched her to Rituxan although she tolerated the medication well. She had a significant relapse with bilateral leg weakness within a couple weeks of the infusion. Therefore, 6 months later, she started  Tysabri again. Due to difficulties with transportation, she transferred care to [place] in October 2016.  She converted to JCV Ab positive early 2021 She switched to Ocrevus.     Imaging: MRI of the brain, cervical spine 01/13/2022 showed an unchanged distribution of white matter lesions consistent with MS.  There were no enhancing lesions.  MRI of the thoracic spine showed no demyelinating plaques.  No significant degenerative changes.  No spinal stenosis or nerve root compression.   MRI of the lumbar spine 01/13/2022 was unremarkable.   MRI of the brain 04/20/2021 showed T2/FLAIR hyperintense foci in the hemispheres, brainstem and cerebellum in a pattern and configuration consistent with chronic demyelinating plaque associated with multiple sclerosis.  None of the foci appear to be acute.  Compared to the MRI dated 06/23/2015, there do not appear to be any definite new lesions.  Generalized cortical atrophy that has progressed compared to the 2016 MRI   MRI of the cervical spine 04/20/2021 showed  Multiple discrete and confluent foci within the spinal cord and additional foci noted within the brainstem and cerebellum.  These are consistent with chronic demyelinating plaque associated with multiple sclerosis.  There is no definite change compared to the MRI dated 06/23/2015.   At C4-C5, there are disc osteophyte complexes causing borderline spinal stenosis and mild foraminal narrowing but no nerve root compression.  This has progressed compared to the 2016 MRI.   MRI of the brain 06/25/2015 shows a couple small T2/FLAIR hyperintense foci in the cerebellar hemispheres and other foci in the left middle cerebellar peduncle and pons..   The deep gray matter appears normal.  In the hemispheres, there are are multiple single and confluent T2/FLAIR hyperintense foci, predominantly in the periventricular white matter. Many of these foci are radially oriented to the ventricles.    MRI of the cervical spine 06/23/2015  shows multiple T2 hyperintense foci within the spinal cord. They are located posteriorly to the left adjacent to C2, posteriorly adjacent to C3, posterior to the right adjacent to C4, posterior to the right adjacent to C5, posterior to the right and central adjacent to C6.   None of these foci enhanced after contrast administration.    REVIEW OF SYSTEMS: Out of a complete 14 system review of symptoms, the patient complains only of the following symptoms, weakness, numbness, spacticity, frequent urination and all other reviewed systems are negative.  ALLERGIES: No Known Allergies  HOME MEDICATIONS: Outpatient Medications Prior to Visit  Medication Sig Dispense Refill   albuterol (PROVENTIL) (2.5 MG/3ML) 0.083% nebulizer solution INHALE 3 ML BY NEBULIZATION  EVERY 6 HOURS AS NEEDED FOR WHEEZING OR SHORTNESS OF BREATH 75 mL 12   albuterol (VENTOLIN HFA) 108 (90 Base) MCG/ACT inhaler Inhale 2 puffs into the lungs every 6 (six) hours as needed for wheezing or shortness of breath. 8 g 2   baclofen (LIORESAL) 20 MG tablet Take 1 tablet (20 mg total) by mouth 3 (three) times daily. 90 tablet 8   dalfampridine 10 MG TB12 TAKE 1 TABLET TWICE A DAY 180 tablet 1   fexofenadine (ALLEGRA ALLERGY) 60 MG tablet Take 1 tablet (60 mg total) by mouth 2 (two) times daily. 60 tablet 0   fluticasone-salmeterol (ADVAIR DISKUS) 250-50 MCG/ACT AEPB Inhale 1 puffs into the lungs in the morning and at bedtime. 120 each 0   gabapentin (NEURONTIN) 300 MG capsule Take 1 capsule (300 mg total) by mouth 3 (three) times daily. 90 capsule 11   hydrochlorothiazide (HYDRODIURIL) 25 MG tablet Take 1 tablet (25 mg total) by mouth daily. 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    PAST SURGICAL HISTORY: Past Surgical History:  Procedure Laterality Date   CESAREAN SECTION      FAMILY HISTORY: Family History  Problem Relation Age of Onset   Asthma Mother    Hypertension Father    Healthy Sister    Healthy Brother     SOCIAL HISTORY: Social History   Socioeconomic History   Marital status: Single    Spouse name: Not on file   Number of children: Not on file   Years of education: Not on file   Highest education level: Not on file  Occupational History   Not on file  Tobacco Use   Smoking status: Never   Smokeless tobacco: Never  Vaping Use   Vaping status: Never Used  Substance and Sexual Activity   Alcohol use: No   Drug use: No   Sexual activity: Not Currently    Birth control/protection: Injection  Other Topics Concern   Not on file  Social History Narrative   Right Handed   1 Soda every now and then   Social Determinants of Health   Financial Resource Strain: Not on file  Food Insecurity: Not on file  Transportation Needs: Not on file  Physical Activity: Not on file  Stress: Not on file  Social Connections: Not on file  Intimate Partner Violence: Not on file      PHYSICAL EXAM  There were no vitals filed for this visit.   There is no height or weight on file to calculate BMI.  Generalized: Well developed, in no acute distress  Cardiology: normal rate and rhythm, no murmur noted Neurological examination  Mentation: Alert oriented to time, place, history taking. Follows all commands speech and language fluent Cranial nerve II-XII: Pupils were equal round reactive to light.  Extraocular movements were full, visual field were  full on confrontational test. Facial sensation and strength were normal. Uvula tongue midline. Head turning and shoulder shrug  were normal and symmetric. Motor: The motor testing reveals 5 over 5 strength of right upper extremity,3/5 of left upper flexion, 2/5 left upper extension, 2/5 bilateral lower extremities. Good symmetric motor tone is noted throughout.  Sensory: Sensory testing is intact to soft touch on all 4 extremities. No evidence of extinction is noted.  Coordination: Cerebellar testing reveals good finger-nose-finger with right hand, unable to perform with left hand and bilateral lower extremities.   Gait and station: Unable to ambulate, patient is in wheelchair today. Reflexes: Deep tendon reflexes are diminished in bilateral lower patellar   DIAGNOSTIC DATA (LABS, IMAGING, TESTING) - I reviewed patient records, labs, notes, testing and imaging myself where available.      No data to display           Lab Results  Component Value Date   WBC 8.7 11/27/2022   HGB 12.3 11/27/2022   HCT 38.4 11/27/2022   MCV 82 11/27/2022   PLT 262 11/27/2022      Component Value Date/Time   NA 137 11/27/2022 1017   K 3.9 11/27/2022 1017   CL 98 11/27/2022 1017   CO2 25 11/27/2022 1017   GLUCOSE 261 (H) 11/27/2022 1017   GLUCOSE 112 (H) 01/13/2022 0148   BUN 7 11/27/2022 1017   CREATININE 0.82 11/27/2022 1017   CALCIUM 9.7 11/27/2022 1017   PROT 6.7 11/27/2022 1017   ALBUMIN 4.2 11/27/2022 1017   AST 23 11/27/2022 1017   ALT 39 (H) 11/27/2022 1017   ALKPHOS 114 11/27/2022 1017   BILITOT 0.4 11/27/2022 1017   GFRNONAA >60 01/13/2022 0148   GFRAA 144 05/21/2019 1057   No results found for: "CHOL", "HDL", "LDLCALC", "LDLDIRECT", "TRIG", "CHOLHDL" No results found for: "HGBA1C" No results found for: "VITAMINB12" Lab Results  Component Value Date   TSH 3.160 02/02/2022       ASSESSMENT AND PLAN 37 y.o. 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 here with   No diagnosis found.    Corinthia feels that MS symptoms are fairly stable. She will continue Ocrevus infusions every 6 months. I will update labs today.  We will anticipate replacing vitamin D as needed.  She will continue baclofen 10-20mg  and may take up to 4 times daily. She may also start tizanidine 4mg  daily and titrate to 4mg  TID if well tolerated. Continue Ampyra 10mg  BID. Continue phentermine 37.5mg  daily. Monitor BP closely. I have placed a referral to PCP to establish care.  I would also like for her to participate in physical therapy for spasticity and hemiplegia.  HH order placed. She is wheelchair bound and has family assisting with transfers. I would like for her to follow-up in 4 to 6 months with Lindsay. [facility].  She verbalizes understanding and agreement with this plan.   No orders of the defined types were placed in this encounter.    No orders of the defined types were placed in this encounter.    Shawnie Dapper, FNP-C 07/03/2023, 8:18 AM [facility] [address] [place], [place] [address] [phone]

## 2023-07-04 ENCOUNTER — Encounter: Payer: Self-pay | Admitting: *Deleted

## 2023-07-04 ENCOUNTER — Ambulatory Visit (INDEPENDENT_AMBULATORY_CARE_PROVIDER_SITE_OTHER): Payer: Medicare (Managed Care) | Admitting: Family Medicine

## 2023-07-04 ENCOUNTER — Encounter: Payer: Self-pay | Admitting: Family Medicine

## 2023-07-04 VITALS — BP 138/89 | HR 90 | Ht 62.0 in

## 2023-07-04 DIAGNOSIS — Z993 Dependence on wheelchair: Secondary | ICD-10-CM | POA: Diagnosis not present

## 2023-07-04 DIAGNOSIS — R29898 Other symptoms and signs involving the musculoskeletal system: Secondary | ICD-10-CM

## 2023-07-04 DIAGNOSIS — Z79899 Other long term (current) drug therapy: Secondary | ICD-10-CM | POA: Diagnosis not present

## 2023-07-04 DIAGNOSIS — R269 Unspecified abnormalities of gait and mobility: Secondary | ICD-10-CM

## 2023-07-04 DIAGNOSIS — G35 Multiple sclerosis: Secondary | ICD-10-CM | POA: Diagnosis not present

## 2023-07-04 DIAGNOSIS — G8114 Spastic hemiplegia affecting left nondominant side: Secondary | ICD-10-CM | POA: Diagnosis not present

## 2023-07-04 DIAGNOSIS — E559 Vitamin D deficiency, unspecified: Secondary | ICD-10-CM

## 2023-07-04 DIAGNOSIS — I10 Essential (primary) hypertension: Secondary | ICD-10-CM

## 2023-07-04 MED ORDER — HYDROCHLOROTHIAZIDE 25 MG PO TABS: 25.0000 mg | ORAL_TABLET | Freq: Every day | ORAL | 1 refills | Status: DC

## 2023-07-04 MED ORDER — BACLOFEN 20 MG PO TABS: 20.0000 mg | ORAL_TABLET | Freq: Three times a day (TID) | ORAL | 1 refills | Status: DC

## 2023-07-04 MED ORDER — DALFAMPRIDINE ER 10 MG PO TB12: 10.0000 mg | ORAL_TABLET | Freq: Two times a day (BID) | ORAL | 1 refills | Status: DC

## 2023-07-05 ENCOUNTER — Other Ambulatory Visit: Payer: Self-pay | Admitting: Family Medicine

## 2023-07-05 ENCOUNTER — Encounter: Payer: Self-pay | Admitting: Family Medicine

## 2023-07-05 ENCOUNTER — Other Ambulatory Visit: Payer: Self-pay

## 2023-07-05 DIAGNOSIS — R269 Unspecified abnormalities of gait and mobility: Secondary | ICD-10-CM

## 2023-07-05 DIAGNOSIS — R7309 Other abnormal glucose: Secondary | ICD-10-CM

## 2023-07-05 DIAGNOSIS — G35 Multiple sclerosis: Secondary | ICD-10-CM

## 2023-07-05 LAB — COMPREHENSIVE METABOLIC PANEL
ALT: 21 [IU]/L (ref 0–32)
AST: 15 [IU]/L (ref 0–40)
Albumin: 3.7 g/dL — ABNORMAL LOW (ref 3.9–4.9)
Alkaline Phosphatase: 142 [IU]/L — ABNORMAL HIGH (ref 44–121)
BUN/Creatinine Ratio: 11 (ref 9–23)
BUN: 8 mg/dL (ref 6–20)
Bilirubin Total: 0.3 mg/dL (ref 0.0–1.2)
CO2: 24 mmol/L (ref 20–29)
Calcium: 9.2 mg/dL (ref 8.7–10.2)
Chloride: 100 mmol/L (ref 96–106)
Creatinine, Ser: 0.74 mg/dL (ref 0.57–1.00)
Globulin, Total: 2.8 g/dL (ref 1.5–4.5)
Glucose: 257 mg/dL — ABNORMAL HIGH (ref 70–99)
Potassium: 3.4 mmol/L — ABNORMAL LOW (ref 3.5–5.2)
Sodium: 139 mmol/L (ref 134–144)
Total Protein: 6.5 g/dL (ref 6.0–8.5)
eGFR: 108 mL/min/{1.73_m2} (ref >59)

## 2023-07-05 LAB — CBC WITH DIFFERENTIAL/PLATELET
Basophils Absolute: 0 10*3/uL (ref 0.0–0.2)
Basos: 0 %
EOS (ABSOLUTE): 0.5 10*3/uL — ABNORMAL HIGH (ref 0.0–0.4)
Eos: 6 %
Hematocrit: 38.7 % (ref 34.0–46.6)
Hemoglobin: 12.2 g/dL (ref 11.1–15.9)
Immature Grans (Abs): 0 10*3/uL (ref 0.0–0.1)
Immature Granulocytes: 0 %
Lymphocytes Absolute: 2.2 10*3/uL (ref 0.7–3.1)
Lymphs: 26 %
MCH: 28.4 pg (ref 26.6–33.0)
MCHC: 31.5 g/dL (ref 31.5–35.7)
MCV: 90 fL (ref 79–97)
Monocytes Absolute: 0.7 10*3/uL (ref 0.1–0.9)
Monocytes: 8 %
Neutrophils Absolute: 5 10*3/uL (ref 1.4–7.0)
Neutrophils: 60 %
Platelets: 315 10*3/uL (ref 150–450)
RBC: 4.3 x10E6/uL (ref 3.77–5.28)
RDW: 13.1 % (ref 11.7–15.4)
WBC: 8.4 10*3/uL (ref 3.4–10.8)

## 2023-07-05 LAB — IGG, IGA, IGM
IgA/Immunoglobulin A, Serum: 335 mg/dL (ref 87–352)
IgG (Immunoglobin G), Serum: 1003 mg/dL (ref 586–1602)
IgM (Immunoglobulin M), Srm: 28 mg/dL (ref 26–217)

## 2023-07-05 LAB — VITAMIN D 25 HYDROXY (VIT D DEFICIENCY, FRACTURES): Vit D, 25-Hydroxy: 72.5 ng/mL (ref 30.0–100.0)

## 2023-07-05 NOTE — Addendum Note (Signed)
Addended by: Aura Camps on: 07/05/2023 04:44 PM   Modules accepted: Orders

## 2023-07-05 NOTE — Addendum Note (Signed)
Addended by: Shawnie Dapper L on: 07/05/2023 04:23 PM   Modules accepted: Orders

## 2023-07-07 LAB — HEMOGLOBIN A1C
Est. average glucose Bld gHb Est-mCnc: 312 mg/dL
Hgb A1c MFr Bld: 12.5 % — ABNORMAL HIGH (ref 4.8–5.6)

## 2023-07-07 LAB — SPECIMEN STATUS REPORT

## 2023-07-09 ENCOUNTER — Telehealth: Payer: Self-pay | Admitting: *Deleted

## 2023-07-09 ENCOUNTER — Encounter: Payer: Self-pay | Admitting: *Deleted

## 2023-07-09 MED ORDER — DALFAMPRIDINE ER 10 MG PO TB12: 10.0000 mg | ORAL_TABLET | Freq: Two times a day (BID) | ORAL | 1 refills | Status: DC

## 2023-07-09 NOTE — Telephone Encounter (Signed)
-----   Message from Amy Lomax sent at 07/04/2023  4:22 PM EST ----- Can you guys help me track down her referral to [facility] for bilateral ankle splints d/t ankle contractures. She needs these braces and Dexa performed before I can send her back to PT/OT. Also, she was advised to reach out to [employer] to adjust her power wheelchair to help with lower ext and ankle placement. I am happy to place orders if needed. Can I also place DME orders for new power chair cushion? Does that need to come from [employer] as well? She reports current cushion is uncomfortable. TY!

## 2023-07-09 NOTE — Progress Notes (Signed)
Sent my chart message asking if this is correct provider.

## 2023-07-09 NOTE — Telephone Encounter (Signed)
Copy of A1C faxed to Dr. Annita Brod 1 [phone]

## 2023-07-10 ENCOUNTER — Telehealth: Payer: Self-pay | Admitting: Family Medicine

## 2023-07-10 NOTE — Telephone Encounter (Signed)
[facility] Harvey: [id number] exp. 07/10/23-10/08/23 sent to GI [phone]

## 2023-07-10 NOTE — Telephone Encounter (Signed)
Orders signed by Amy NP yesterday.

## 2023-07-10 NOTE — Telephone Encounter (Signed)
Order for bilateral ankle splints d/t ankle contractures faxed to [facility] along with office notes,   Message sent to Zenia Resides with [employer] to see if she can help with wheelchair cushion ( pending response)

## 2023-07-11 NOTE — Telephone Encounter (Signed)
Fax confirmation received for  [phone]

## 2023-07-11 NOTE — Telephone Encounter (Signed)
I called patient to discuss where she received her wheelchair and was told [employer] order faxed to [phone] confirmation received.

## 2023-07-12 ENCOUNTER — Ambulatory Visit (HOSPITAL_BASED_OUTPATIENT_CLINIC_OR_DEPARTMENT_OTHER)
Admission: RE | Admit: 2023-07-12 | Discharge: 2023-07-12 | Disposition: A | Discharge status: Home / Self Care | Payer: Medicare (Managed Care) | Source: Ambulatory Visit | Attending: Neurology | Admitting: Neurology

## 2023-07-12 DIAGNOSIS — Z79899 Other long term (current) drug therapy: Secondary | ICD-10-CM | POA: Insufficient documentation

## 2023-07-12 DIAGNOSIS — Z7952 Long term (current) use of systemic steroids: Secondary | ICD-10-CM | POA: Insufficient documentation

## 2023-07-12 DIAGNOSIS — Z993 Dependence on wheelchair: Secondary | ICD-10-CM | POA: Diagnosis present

## 2023-07-12 DIAGNOSIS — M85851 Other specified disorders of bone density and structure, right thigh: Secondary | ICD-10-CM | POA: Diagnosis not present

## 2023-07-13 ENCOUNTER — Encounter: Payer: Self-pay | Admitting: Family Medicine

## 2023-07-16 NOTE — Telephone Encounter (Signed)
Patient is in a wheelchair and needs to go to the hospital. I changed the location on the PA and sent the orders to [facility] to schedule. [phone]

## 2023-07-16 NOTE — Telephone Encounter (Signed)
Amy I wasn't sure if you wanted her to go back on 50,000 units weekly or decrease. Per note on 07/04/23 " We will anticipate replacing vitamin D as needed."  I did relay to patient you were out today.

## 2023-07-16 NOTE — Telephone Encounter (Signed)
Dr.Sater patient requesting bone density results for 07/12/23.

## 2023-07-23 ENCOUNTER — Other Ambulatory Visit: Payer: Self-pay | Admitting: Family Medicine

## 2023-07-23 MED ORDER — VITAMIN D (ERGOCALCIFEROL) 1.25 MG (50000 UNIT) PO CAPS: 50000.0000 [IU] | ORAL_CAPSULE | ORAL | 1 refills | Status: DC

## 2023-07-23 NOTE — Telephone Encounter (Signed)
Just FYI.

## 2023-07-26 ENCOUNTER — Ambulatory Visit (HOSPITAL_COMMUNITY): Payer: Medicare (Managed Care)

## 2023-07-30 ENCOUNTER — Other Ambulatory Visit: Payer: Self-pay

## 2023-07-30 DIAGNOSIS — G35 Multiple sclerosis: Secondary | ICD-10-CM

## 2023-07-31 ENCOUNTER — Ambulatory Visit (HOSPITAL_COMMUNITY): Payer: Medicare (Managed Care)

## 2023-08-03 ENCOUNTER — Encounter (HOSPITAL_COMMUNITY): Payer: Medicare (Managed Care)

## 2023-08-07 DIAGNOSIS — E119 Type 2 diabetes mellitus without complications: Secondary | ICD-10-CM | POA: Insufficient documentation

## 2023-08-08 ENCOUNTER — Encounter (HOSPITAL_COMMUNITY): Payer: Self-pay

## 2023-08-09 ENCOUNTER — Non-Acute Institutional Stay (HOSPITAL_COMMUNITY)
Admission: RE | Admit: 2023-08-09 | Discharge: 2023-08-09 | Disposition: A | Discharge status: Home / Self Care | Payer: Medicare (Managed Care) | Source: Ambulatory Visit | Attending: Internal Medicine | Admitting: Internal Medicine

## 2023-08-09 DIAGNOSIS — G35 Multiple sclerosis: Secondary | ICD-10-CM | POA: Insufficient documentation

## 2023-08-09 MED ORDER — DIPHENHYDRAMINE HCL 50 MG/ML IJ SOLN
50.0000 mg | Freq: Once | INTRAMUSCULAR | Status: AC
  Administered 2023-08-09: 50 mg via INTRAVENOUS
  Filled 2023-08-09: qty 1

## 2023-08-09 MED ORDER — SODIUM CHLORIDE 0.9 % IV SOLN
600.0000 mg | Freq: Once | INTRAVENOUS | Status: AC
  Administered 2023-08-09: 600 mg via INTRAVENOUS
  Filled 2023-08-09: qty 20

## 2023-08-09 MED ORDER — SODIUM CHLORIDE 0.9 % IV SOLN: INTRAVENOUS | Status: DC | PRN

## 2023-08-09 MED ORDER — FAMOTIDINE IN NACL 20-0.9 MG/50ML-% IV SOLN
20.0000 mg | Freq: Once | INTRAVENOUS | Status: AC
  Administered 2023-08-09: 20 mg via INTRAVENOUS
  Filled 2023-08-09: qty 50

## 2023-08-09 MED ORDER — METHYLPREDNISOLONE SODIUM SUCC 125 MG IJ SOLR
125.0000 mg | Freq: Once | INTRAMUSCULAR | Status: AC
  Administered 2023-08-09: 125 mg via INTRAVENOUS
  Filled 2023-08-09: qty 2

## 2023-08-09 MED ORDER — ACETAMINOPHEN 325 MG PO TABS
650.0000 mg | ORAL_TABLET | Freq: Once | ORAL | Status: AC
  Administered 2023-08-09: 650 mg via ORAL
  Filled 2023-08-09: qty 2

## 2023-08-09 NOTE — Progress Notes (Signed)
PATIENT CARE CENTER NOTE     Diagnosis: Multiple sclerosis (HCC) [G35]      Provider: Despina Arias, MD     Procedure: Emogene Morgan infusion      Note: Patient received Ocrevus 600 mg infusion via PIV. Patient given pre-meds (Tylenol, IV Benadryl, IV Solu-medrol, Pepcid IVPB) per order. Infusion titrated per protocol. Patient tolerated well with no adverse reaction. Patient declined to wait for the 1 hour post infusion observation. Discharge instructions given. Patient to come back in 6 months and will schedule next appointment at the front desk. Vital signs stable. Patient alert, oriented and transfers in personal motorized wheel chair at discharge.  Patient discharged home with family member.

## 2023-08-15 ENCOUNTER — Ambulatory Visit (HOSPITAL_COMMUNITY): Payer: Medicare (Managed Care)

## 2023-08-15 ENCOUNTER — Ambulatory Visit (HOSPITAL_COMMUNITY): Admission: RE | Admit: 2023-08-15 | Payer: Medicare (Managed Care) | Source: Ambulatory Visit

## 2023-08-22 ENCOUNTER — Ambulatory Visit (HOSPITAL_COMMUNITY)
Admission: RE | Admit: 2023-08-22 | Discharge: 2023-08-22 | Disposition: A | Discharge status: Home / Self Care | Payer: Medicare (Managed Care) | Source: Ambulatory Visit | Attending: Family Medicine | Admitting: Family Medicine

## 2023-08-22 DIAGNOSIS — R29898 Other symptoms and signs involving the musculoskeletal system: Secondary | ICD-10-CM

## 2023-08-22 DIAGNOSIS — G35 Multiple sclerosis: Secondary | ICD-10-CM

## 2023-08-22 MED ORDER — GADOBUTROL 1 MMOL/ML IV SOLN
7.0000 mL | Freq: Once | INTRAVENOUS | Status: AC | PRN
  Administered 2023-08-22: 7 mL via INTRAVENOUS

## 2023-09-03 ENCOUNTER — Other Ambulatory Visit: Payer: Self-pay

## 2023-09-03 ENCOUNTER — Other Ambulatory Visit: Payer: Self-pay | Admitting: Neurology

## 2023-09-03 DIAGNOSIS — R269 Unspecified abnormalities of gait and mobility: Secondary | ICD-10-CM

## 2023-09-03 DIAGNOSIS — G35 Multiple sclerosis: Secondary | ICD-10-CM

## 2023-09-03 NOTE — Telephone Encounter (Signed)
 Last Seen 07/04/2023 Upcoming Appointment 01/17/2024  Baclofen  20mg  Last Filled 08/22/2023 ([facility] Day Supply) Dalfampridine  Last Filled 06/18/2023 ([facility] Day [facility])

## 2023-09-04 ENCOUNTER — Encounter: Payer: Self-pay | Admitting: Family Medicine

## 2023-09-10 ENCOUNTER — Telehealth: Payer: Self-pay | Admitting: Family Medicine

## 2023-09-10 DIAGNOSIS — R269 Unspecified abnormalities of gait and mobility: Secondary | ICD-10-CM

## 2023-09-10 DIAGNOSIS — G35 Multiple sclerosis: Secondary | ICD-10-CM

## 2023-09-10 MED ORDER — DALFAMPRIDINE ER 10 MG PO TB12: 10.0000 mg | ORAL_TABLET | Freq: Two times a day (BID) | ORAL | 1 refills | Status: DC

## 2023-09-10 MED ORDER — BACLOFEN 20 MG PO TABS: 20.0000 mg | ORAL_TABLET | Freq: Three times a day (TID) | ORAL | 1 refills | Status: DC

## 2023-09-10 MED ORDER — VITAMIN D (ERGOCALCIFEROL) 1.25 MG (50000 UNIT) PO CAPS: 50000.0000 [IU] | ORAL_CAPSULE | ORAL | 1 refills | Status: DC

## 2023-09-10 MED ORDER — GABAPENTIN 300 MG PO CAPS: 300.0000 mg | ORAL_CAPSULE | Freq: Three times a day (TID) | ORAL | 4 refills | Status: DC

## 2023-09-10 NOTE — Telephone Encounter (Signed)
 Lexi from [facility] called and LVM stating that he pt is needing a refill oh her baclofen (LIORESAL) 20 MG tablet , gabapentin (NEURONTIN) 300 MG capsule , dalfampridine 10 MG TB12 and the Vitamin D, Ergocalciferol, (DRISDOL) 1.25 MG (50000 UNIT) CAPS capsule and have it sent to them. Numbers to reach her are Phone-[phone] or [phone]

## 2023-09-10 NOTE — Telephone Encounter (Signed)
 Refill request sent this afternoon to pharmacy

## 2023-09-25 ENCOUNTER — Telehealth: Payer: Self-pay | Admitting: *Deleted

## 2023-09-25 NOTE — Telephone Encounter (Signed)
 Faxed signed order for electric wheelchair repairs back to [employer] at [phone]. Received fax confirmation.

## 2023-10-19 ENCOUNTER — Telehealth: Payer: Self-pay | Admitting: Family Medicine

## 2023-10-19 ENCOUNTER — Other Ambulatory Visit (HOSPITAL_COMMUNITY): Payer: Self-pay

## 2023-10-19 ENCOUNTER — Telehealth: Payer: Self-pay

## 2023-10-19 NOTE — Telephone Encounter (Signed)
 Pharmacy Patient Advocate Encounter   Received notification from Physician's Office that prior authorization for Dalfampridine ER 10MG  er tablets is required/requested.   Insurance verification completed.   The patient is insured through [facility] .   Per test claim: PA required; PA submitted to above mentioned insurance via CoverMyMeds Key/confirmation #/EOC [id number] Status is pending

## 2023-10-19 NOTE — Telephone Encounter (Signed)
 Daphne from [facility] called wanting to check the update on the pt's dalfampridine 10 MG TB12 PA. She stated that they are needing a call back on Monday or Rx will be put on hold. Please advise.

## 2023-10-19 NOTE — Telephone Encounter (Signed)
 Please see the below. PA is needed

## 2023-10-23 NOTE — Telephone Encounter (Signed)
 Faxed signed order below to [facility]. Received fax confirmation.

## 2023-10-24 ENCOUNTER — Other Ambulatory Visit (HOSPITAL_COMMUNITY): Payer: Self-pay

## 2023-10-24 NOTE — Telephone Encounter (Signed)
 Pharmacy Patient Advocate Encounter  Received notification from [facility] that Prior Authorization for Dalfampridine ER 10MG  er tablets has been APPROVED from 10/05/2023 to 07/23/2098. Unable to obtain price due to refill too soon rejection, last fill date 10/19/2023 next available fill date4/19/2025   PA #/Case ID/Reference #: PA Case ID #: [id number]

## 2023-11-05 ENCOUNTER — Telehealth: Payer: Self-pay | Admitting: *Deleted

## 2023-11-05 NOTE — Telephone Encounter (Signed)
 Called Medicare at [phone]. Spoke w/ [place]. States no PA needed for Ocrevus [phone]). Ref# for call: [id number].

## 2023-11-05 NOTE — Telephone Encounter (Signed)
 This encounter was created in error - please disregard.

## 2023-12-24 ENCOUNTER — Telehealth: Payer: Self-pay | Admitting: *Deleted

## 2023-12-24 NOTE — Telephone Encounter (Signed)
 Faxed signed order for bilateral AFO to [facility] at [phone]. Received fax confirmation.

## 2024-01-15 ENCOUNTER — Other Ambulatory Visit: Payer: Self-pay

## 2024-01-17 ENCOUNTER — Encounter: Payer: Self-pay | Admitting: Neurology

## 2024-01-17 ENCOUNTER — Ambulatory Visit: Payer: Medicaid Other | Admitting: Neurology

## 2024-01-17 VITALS — BP 110/70 | HR 76

## 2024-01-17 DIAGNOSIS — E559 Vitamin D deficiency, unspecified: Secondary | ICD-10-CM

## 2024-01-17 DIAGNOSIS — R252 Cramp and spasm: Secondary | ICD-10-CM

## 2024-01-17 DIAGNOSIS — G35 Multiple sclerosis: Secondary | ICD-10-CM | POA: Diagnosis not present

## 2024-01-17 DIAGNOSIS — Z79899 Other long term (current) drug therapy: Secondary | ICD-10-CM

## 2024-01-17 DIAGNOSIS — R270 Ataxia, unspecified: Secondary | ICD-10-CM

## 2024-01-17 DIAGNOSIS — G8114 Spastic hemiplegia affecting left nondominant side: Secondary | ICD-10-CM | POA: Diagnosis not present

## 2024-01-17 DIAGNOSIS — R269 Unspecified abnormalities of gait and mobility: Secondary | ICD-10-CM

## 2024-01-17 DIAGNOSIS — J45909 Unspecified asthma, uncomplicated: Secondary | ICD-10-CM | POA: Insufficient documentation

## 2024-01-17 DIAGNOSIS — Z993 Dependence on wheelchair: Secondary | ICD-10-CM

## 2024-01-17 MED ORDER — OXYBUTYNIN CHLORIDE ER 10 MG PO TB24: 10.0000 mg | ORAL_TABLET | Freq: Every day | ORAL | 11 refills | Status: AC

## 2024-01-17 MED ORDER — GABAPENTIN 300 MG PO CAPS: 300.0000 mg | ORAL_CAPSULE | Freq: Three times a day (TID) | ORAL | 4 refills | Status: DC

## 2024-01-17 MED ORDER — BACLOFEN 20 MG PO TABS: 20.0000 mg | ORAL_TABLET | Freq: Three times a day (TID) | ORAL | 4 refills | Status: DC

## 2024-01-17 MED ORDER — VITAMIN D (ERGOCALCIFEROL) 1.25 MG (50000 UNIT) PO CAPS: 50000.0000 [IU] | ORAL_CAPSULE | ORAL | 1 refills | Status: DC

## 2024-01-17 MED ORDER — DALFAMPRIDINE ER 10 MG PO TB12: 10.0000 mg | ORAL_TABLET | Freq: Two times a day (BID) | ORAL | 4 refills | Status: DC

## 2024-01-17 NOTE — Progress Notes (Signed)
 [facility]  PATIENT: Lindsay Schneider DOB: July 06, 1986    _________________________________   HISTORICAL  CHIEF COMPLAINT:  Chief Complaint  Patient presents with   RM10/MS    Pt is here with her boyfriend . Pt states that things have been good so far since last appointment. Pt would like to discuss PT and Occupational Therapy.     HISTORY OF PRESENT ILLNESS:  Lacora Schneider is a 38 y.o. woman who was diagnosed with MS in 2011 after presenting with left sided arm and leg numbness and weakness.    She has an active/relapsing secondary progressive MS.    Update 01/17/2024: She has been on Ocrevus  since 2021 after she converted to JCV Ab positive(high positive at 3.89) and had to discontinue the Tysabri ..   She tolerates Ocrevus  well.  Her next infusion will be  in July 17 at [facility]         She has bilateral foot pain.   Pain is burning sensation in her soles.   This is her on gabapentin .  She needs refill.  She also has pain in the back f her legs.     She is noting more muscle spasms, despite baclofen  10 mg po qid.       She has an Mining engineer wheelchair that is more adjustable..  She is wheelchair bound and needs help to transfer in/out of the wheelchair.   Her left arm and both legs are weak   Her left hand has gradually worsened.   Right leg weakness has progressed over last couple years (left was 0/5)  The right arm is strong but has reduced coordination.  Handwriting is poor.   She has trouble keeping the left hand open.    She feels the spasticity has worsened over the last year.  She is on baclofen  10 mg po qid for the spasticity.   Tizanidine  was poorly tolerated    She has intermittent numbness on the left side and both feet      She has urinary urgency ad rare urge  incontinence.      Vision is poor, left worse than right.  Color vision is reduced on the left.    She notes visual changes more in bright sunlight.    She sleeps  well at night and wakes up after 8 hours.      MS History:   In 2011, onset of left arm and leg numbness and weakness.  Her symptoms develop over 1 day. She had an MRI performed with lesions consistent with MS. She started to see Dr. Onita here at [facility]. She was placed on Rebif. However, she stopped because her legs felt weaker when she was on it. She then transferred her care to Dr. Clarice at [facility].     She was started on Tysabri .  She had several infusions of Tysabri  but transportation was difficult to get to [facility]. Therefore, Dr. Clarice switched her to Rituxan although she tolerated the medication well. She had a significant relapse with bilateral leg weakness within a couple weeks of the infusion. Therefore, 6 months later, she started Tysabri  again. Due to difficulties with transportation, she transferred care to us  in October 2016.  She converted to JCV Ab positive early 2021 She switched to Ocrevus .    Imaging: MRI of the brain, cervical spine 01/13/2022 showed an unchanged distribution of white matter lesions consistent with MS.  There were no enhancing lesions.  MRI of the  thoracic spine showed no demyelinating plaques.  No significant degenerative changes.  No spinal stenosis or nerve root compression.  MRI of the lumbar spine 01/13/2022 was unremarkable.  MRI of the brain 04/20/2021 showed T2/FLAIR hyperintense foci in the hemispheres, brainstem and cerebellum in a pattern and configuration consistent with chronic demyelinating plaque associated with multiple sclerosis.  None of the foci appear to be acute.  Compared to the MRI dated 06/23/2015, there do not appear to be any definite new lesions.  Generalized cortical atrophy that has progressed compared to the 2016 MRI  MRI of the cervical spine 04/20/2021 showed  Multiple discrete and confluent foci within the spinal cord and additional foci noted within the brainstem and cerebellum.  These are consistent with chronic demyelinating plaque  associated with multiple sclerosis.  There is no definite change compared to the MRI dated 06/23/2015.   At C4-C5, there are disc osteophyte complexes causing borderline spinal stenosis and mild foraminal narrowing but no nerve root compression.  This has progressed compared to the 2016 MRI.  MRI of the brain 06/25/2015 shows a couple small T2/FLAIR hyperintense foci in the cerebellar hemispheres and other foci in the left middle cerebellar peduncle and pons..   The deep gray matter appears normal.  In the hemispheres, there are are multiple single and confluent T2/FLAIR hyperintense foci, predominantly in the periventricular white matter. Many of these foci are radially oriented to the ventricles.   MRI of the cervical spine 06/23/2015 shows multiple T2 hyperintense foci within the spinal cord. They are located posteriorly to the left adjacent to C2, posteriorly adjacent to C3, posterior to the right adjacent to C4, posterior to the right adjacent to C5, posterior to the right and central adjacent to C6.   None of these foci enhanced after contrast administration.  REVIEW OF SYSTEMS: Constitutional: No fevers, chills, sweats, or change in appetite.   She has fatigue and poor sleep.    Eyes: No visual changes, double vision, eye pain Ear, nose and throat: No hearing loss, ear pain, nasal congestion, sore throat Cardiovascular: No chest pain, palpitations Respiratory:  No shortness of breath at rest or with exertion.   No wheezes GastrointestinaI: No nausea, vomiting, diarrhea, abdominal pain, fecal incontinence Genitourinary:  Shehas urinary urgency and frequency with occ incontinence.    Hasnocturia. Musculoskeletal:  No neck pain, back pain Integumentary: No rash, pruritus, skin lesions Neurological: as above Psychiatric:   Some depression at this time.  No anxiety Endocrine: No palpitations, diaphoresis, change in appetite, change in weigh or increased thirst Hematologic/Lymphatic:  No anemia,  purpura, petechiae. Allergic/Immunologic: No itchy/runny eyes, nasal congestion, recent allergic reactions, rashes  ALLERGIES: No Known Allergies  HOME MEDICATIONS:  Current Outpatient Medications:    albuterol  (PROVENTIL ) (2.5 MG/3ML) 0.083% nebulizer solution, INHALE 3 ML BY NEBULIZATION EVERY 6 HOURS AS NEEDED FOR WHEEZING OR SHORTNESS OF BREATH, Disp: 75 mL, Rfl: 12   albuterol  (VENTOLIN  HFA) 108 (90 Base) MCG/ACT inhaler, Inhale 2 puffs into the lungs every 6 (six) hours as needed for wheezing or shortness of breath., Disp: 8 g, Rfl: 2   fexofenadine  (ALLEGRA  ALLERGY) 60 MG tablet, Take 1 tablet (60 mg total) by mouth 2 (two) times daily., Disp: 60 tablet, Rfl: 0   fluticasone -salmeterol (ADVAIR  DISKUS) 250-50 MCG/ACT AEPB, Inhale 1 puffs into the lungs in the morning and at bedtime., Disp: 120 each, Rfl: 0   hydrochlorothiazide  (HYDRODIURIL ) 25 MG tablet, Take 1 tablet (25 mg total) by mouth daily., Disp: 90 tablet, Rfl:  1   oxybutynin  (DITROPAN  XL) 10 MG 24 hr tablet, Take 1 tablet (10 mg total) by mouth at bedtime., Disp: 30 tablet, Rfl: 11   baclofen  (LIORESAL ) 20 MG tablet, Take 1 tablet (20 mg total) by mouth 3 (three) times daily., Disp: 270 tablet, Rfl: 4   dalfampridine  10 MG TB12, Take 1 tablet (10 mg total) by mouth 2 (two) times daily., Disp: 180 tablet, Rfl: 4   gabapentin  (NEURONTIN ) 300 MG capsule, Take 1 capsule (300 mg total) by mouth 3 (three) times daily., Disp: 270 capsule, Rfl: 4   Vitamin D , Ergocalciferol , (DRISDOL ) 1.25 MG (50000 UNIT) CAPS capsule, Take 1 capsule (50,000 Units total) by mouth every 7 (seven) days., Disp: 12 capsule, Rfl: 1  Current Facility-Administered Medications:    ocrelizumab  (OCREVUS ) 600 mg in sodium chloride  0.9 % 250 mL, 600 mg, Intravenous, Once, Jadalee Westcott, Charlie LABOR, MD  PAST MEDICAL HISTORY: Past Medical History:  Diagnosis Date   Bilateral leg weakness 04/27/2015   Left foot drop 02/08/2017   Movement disorder    MS (multiple  sclerosis) (HCC)    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    PAST SURGICAL HISTORY: Past Surgical History:  Procedure Laterality Date   CESAREAN SECTION      FAMILY HISTORY: Family History  Problem Relation Age of Onset   Asthma Mother    Hypertension Father    Healthy Sister    Healthy Brother     SOCIAL HISTORY:  Social History   Socioeconomic History   Marital status: Single    Spouse name: Not on file   Number of children: Not on file   Years of education: Not on file   Highest education level: Not on file  Occupational History   Not on file  Tobacco Use   Smoking status: Never   Smokeless tobacco: Never  Vaping Use   Vaping status: Never Used  Substance and Sexual Activity   Alcohol use: No   Drug use: No   Sexual activity: Not Currently    Birth control/protection: Injection  Other Topics Concern   Not on file  Social History Narrative   Right Handed   1 Soda every now and then   Social Drivers of Health   Financial Resource Strain: Not on file  Food Insecurity: Not on file  Transportation Needs: Not on file  Physical Activity: Not on file  Stress: Not on file  Social Connections: Not on file  Intimate Partner Violence: Not on file     PHYSICAL EXAM  Vitals:   01/17/24 1031  BP: 110/70  Pulse: 76  SpO2: 99%      There is no height or weight on file to calculate BMI.   General: The patient is well-developed and well-nourished and in no acute distress  Neurologic Exam  Mental status: The patient is alert and oriented x 3 at the time of the examination. The patient has apparent normal recent and remote memory, with an apparently normal attention span and concentration ability.   Speech is normal.  Cranial nerves: Extraocular movements are full.. Facial strength and sensation is normal. Trapezius strength is normal.  The tongue is midline, and the patient has symmetric elevation of the soft palate. No obvious  hearing deficits are noted.  Motor: Muscle bulk is normal.  Muscle tone is greatly increased in the left arm and both legs, left greater than right.   Right arm has good tone/strength but reduced RAM.  Strength is 2-/5  extension and 2+/5 flexion in left arm. . Strength is now 1  in the right leg except for 2 - quad.  Left leg is 0  Sensory: she has intact sensation to touch and vibration in the arms.  Normal touch/temperature sensation in the legs but reduced vibration sensation in the left leg.  Coordination: Finger-nose-finger is poor on the right.  Due to weakness, she is unable to do on the left.  She is too weak for heel-to-shin either side  Gait and station: She is unable to stand or walk.  Reflexes: Deep tendon reflexes are increased in arms, left > right and in legs with spread att knees and sustained clonus at the left ankle and nonsustained at the right.       DIAGNOSTIC DATA (LABS, IMAGING, TESTING) - I reviewed patient records, labs, notes, testing and imaging myself where available.  Lab Results  Component Value Date   WBC 8.4 07/04/2023   HGB 12.2 07/04/2023   HCT 38.7 07/04/2023   MCV 90 07/04/2023   PLT 315 07/04/2023      Component Value Date/Time   NA 139 07/04/2023 0850   K 3.4 (L) 07/04/2023 0850   CL 100 07/04/2023 0850   CO2 24 07/04/2023 0850   GLUCOSE 257 (H) 07/04/2023 0850   GLUCOSE 112 (H) 01/13/2022 0148   BUN 8 07/04/2023 0850   CREATININE 0.74 07/04/2023 0850   CALCIUM 9.2 07/04/2023 0850   PROT 6.5 07/04/2023 0850   ALBUMIN 3.7 (L) 07/04/2023 0850   AST 15 07/04/2023 0850   ALT 21 07/04/2023 0850   ALKPHOS 142 (H) 07/04/2023 0850   BILITOT 0.3 07/04/2023 0850   GFRNONAA >60 01/13/2022 0148   GFRAA 144 05/21/2019 1057       ASSESSMENT AND PLAN  Multiple sclerosis (HCC) - Plan: Ambulatory referral to Physical Therapy, Ambulatory referral to Occupational Therapy, dalfampridine  10 MG TB12, gabapentin  (NEURONTIN ) 300 MG capsule, IgG, IgA,  IgM, CD20 B Cells, Hepatitis B Core AB, Total, Hep B Surface Antigen, Hepatitis B surface antibody,qualitative  Gait disturbance - Plan: dalfampridine  10 MG TB12, gabapentin  (NEURONTIN ) 300 MG capsule  Ataxia - Plan: Ambulatory referral to Occupational Therapy  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    She has an active form of secondary progressive MS.  Continue Ocrevus .  Check IgG/IgM, CBC with differential and CMP.  2.   Continue baclofen  up to 60 mg a day and continue lorazepam  nightly to help with her insomnia and nighttime spasticity.  Continue gabapentin  for dysesthesia 3.    Try to stay active and exercise as tolerated.   4.     Return in 6 months or sooner if there are new or worsening neurologic symptoms.   This visit is part of a comprehensive longitudinal care medical relationship regarding the patients primary diagnosis of MS and related concerns.   Ameena Vesey A. Vear, MD, PhD 01/17/2024, 5:24 PM Certified in Neurology, Clinical Neurophysiology, Sleep Medicine, Pain Medicine and Neuroimaging  [facility] [address] [place], [place] [address] [phone]

## 2024-01-25 LAB — CD20 B CELLS
% CD19-B Cells: 0 % — ABNORMAL LOW (ref 4.6–22.1)
% CD20-B Cells: 0 % — ABNORMAL LOW (ref 5.0–22.3)

## 2024-01-25 LAB — IGG, IGA, IGM
IgA/Immunoglobulin A, Serum: 327 mg/dL (ref 87–352)
IgG (Immunoglobin G), Serum: 1240 mg/dL (ref 586–1602)
IgM (Immunoglobulin M), Srm: 33 mg/dL (ref 26–217)

## 2024-01-25 LAB — HEPATITIS B SURFACE ANTIGEN: Hepatitis B Surface Ag: NEGATIVE

## 2024-01-25 LAB — HEPATITIS B CORE ANTIBODY, TOTAL: Hep B Core Total Ab: NEGATIVE

## 2024-01-25 LAB — HEPATITIS B SURFACE ANTIBODY,QUALITATIVE: Hep B Surface Ab, Qual: NONREACTIVE

## 2024-01-26 ENCOUNTER — Ambulatory Visit: Payer: Self-pay | Admitting: Neurology

## 2024-01-29 ENCOUNTER — Other Ambulatory Visit: Payer: Self-pay | Admitting: *Deleted

## 2024-01-29 DIAGNOSIS — R269 Unspecified abnormalities of gait and mobility: Secondary | ICD-10-CM

## 2024-01-29 DIAGNOSIS — G35 Multiple sclerosis: Secondary | ICD-10-CM

## 2024-01-29 MED ORDER — GABAPENTIN 300 MG PO CAPS: 300.0000 mg | ORAL_CAPSULE | Freq: Three times a day (TID) | ORAL | 4 refills | Status: AC

## 2024-01-29 NOTE — Telephone Encounter (Signed)
 Last seen on 01/17/24 Follow up scheduled on 08/19/24

## 2024-02-04 ENCOUNTER — Other Ambulatory Visit: Payer: Self-pay

## 2024-02-04 DIAGNOSIS — G35 Multiple sclerosis: Secondary | ICD-10-CM

## 2024-02-07 ENCOUNTER — Non-Acute Institutional Stay (HOSPITAL_COMMUNITY)
Admission: RE | Admit: 2024-02-07 | Discharge: 2024-02-07 | Disposition: A | Discharge status: Home / Self Care | Payer: Medicare (Managed Care) | Source: Ambulatory Visit | Attending: Internal Medicine | Admitting: Internal Medicine

## 2024-02-07 DIAGNOSIS — G35 Multiple sclerosis: Secondary | ICD-10-CM | POA: Insufficient documentation

## 2024-02-07 MED ORDER — SODIUM CHLORIDE 0.9 % IV SOLN
600.0000 mg | Freq: Once | INTRAVENOUS | Status: AC
  Administered 2024-02-07: 600 mg via INTRAVENOUS
  Filled 2024-02-07: qty 20

## 2024-02-07 MED ORDER — ACETAMINOPHEN 325 MG PO TABS
650.0000 mg | ORAL_TABLET | Freq: Once | ORAL | Status: AC
  Administered 2024-02-07: 650 mg via ORAL
  Filled 2024-02-07: qty 2

## 2024-02-07 MED ORDER — DIPHENHYDRAMINE HCL 50 MG/ML IJ SOLN
50.0000 mg | Freq: Once | INTRAMUSCULAR | Status: AC
  Administered 2024-02-07: 50 mg via INTRAVENOUS
  Filled 2024-02-07: qty 1

## 2024-02-07 MED ORDER — SODIUM CHLORIDE 0.9 % IV SOLN: INTRAVENOUS | Status: DC | PRN

## 2024-02-07 MED ORDER — METHYLPREDNISOLONE SODIUM SUCC 125 MG IJ SOLR
125.0000 mg | Freq: Once | INTRAMUSCULAR | Status: AC
  Administered 2024-02-07: 125 mg via INTRAVENOUS
  Filled 2024-02-07: qty 2

## 2024-02-07 MED ORDER — FAMOTIDINE IN NACL 20-0.9 MG/50ML-% IV SOLN
20.0000 mg | Freq: Once | INTRAVENOUS | Status: AC
  Administered 2024-02-07: 20 mg via INTRAVENOUS
  Filled 2024-02-07: qty 50

## 2024-02-07 NOTE — Progress Notes (Signed)
 PATIENT CARE CENTER NOTE     Diagnosis: Multiple sclerosis (HCC) [G35]      Provider: Vear Ade, MD     Procedure: Ocrevus  infusion      Note: Patient received Ocrevus  600 mg infusion via PIV. Patient given pre-meds (Tylenol , IV Benadryl , IV Solu-medrol , Pepcid  IVPB) per order. Infusion titrated per protocol. Patient tolerated infusion well with no adverse reaction. Patient declined to wait for the 1 hour post infusion observation. Discharge instructions given. Patient to come back in 6 months and appointment scheduled for 08/07/24. Vital signs stable. Patient alert, oriented and transfers in personal motorized wheel chair at discharge.  Patient discharged home with family member.

## 2024-02-13 ENCOUNTER — Ambulatory Visit: Payer: Medicare (Managed Care)

## 2024-02-18 ENCOUNTER — Ambulatory Visit: Payer: Medicare (Managed Care) | Admitting: Occupational Therapy

## 2024-03-03 ENCOUNTER — Other Ambulatory Visit: Payer: Self-pay | Admitting: *Deleted

## 2024-03-03 NOTE — Telephone Encounter (Signed)
 Last seen on 01/17/24 Follow up scheduled 07/26/23   Dispensed Days Supply Quantity Provider Pharmacy  VIT D-2 1.25MG  SGC(50,000U) 02/28/2024 28 4 each Lomax, Amy, NP [facility] [place]...      Rx just filled, Rx denied.

## 2024-03-10 ENCOUNTER — Other Ambulatory Visit: Payer: Self-pay

## 2024-03-12 ENCOUNTER — Ambulatory Visit: Payer: Medicare (Managed Care) | Attending: Neurology | Admitting: Physical Therapy

## 2024-03-12 ENCOUNTER — Ambulatory Visit: Payer: Medicare (Managed Care) | Admitting: Occupational Therapy

## 2024-03-12 NOTE — Therapy (Incomplete)
 OUTPATIENT PHYSICAL THERAPY NEURO EVALUATION   Patient Name: Lindsay Schneider MRN: [id number] DOB:Jul 04, 1986, 38 y.o., female Today's Date: 03/12/2024   PCP: *** REFERRING PROVIDER: ***  END OF SESSION:   Past Medical History:  Diagnosis Date   Bilateral leg weakness 04/27/2015   Left foot drop 02/08/2017   Movement disorder    MS (multiple sclerosis) (HCC)    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   Past Surgical History:  Procedure Laterality Date   CESAREAN SECTION     Patient Active Problem List   Diagnosis Date Noted   Vitamin D  deficiency 01/17/2024   Asthma 01/17/2024   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) 01/17/2024   Wheelchair dependence 01/17/2024   Type 2 diabetes mellitus (HCC) 08/07/2023   Current use of steroid medication 11/27/2022   Spasticity 11/27/2022   Edema of lower extremity 08/24/2022   High risk medication use 02/02/2022   Wheelchair dependent 02/02/2022   Acute asthma exacerbation 01/12/2022   Asthma exacerbation 11/25/2021   Tachycardia 11/25/2021   Pedal edema 11/25/2021   HTN (hypertension) 11/25/2021   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  Rationale for Evaluation and Treatment: {HABREHAB:27488}  SUBJECTIVE:                                                                                                                                                                                             SUBJECTIVE STATEMENT: *** Pt accompanied by: {accompnied:27141}  PERTINENT HISTORY: ***  PAIN:  Are you having pain? {OPRCPAIN:27236}  PRECAUTIONS: {Therapy  precautions:24002}  RED FLAGS: {PT Red Flags:29287}   WEIGHT BEARING RESTRICTIONS: {Yes ***/No:24003}  FALLS: Has patient fallen in last 6 months? {fallsyesno:27318}  LIVING ENVIRONMENT: Lives with: {OPRC lives with:25569::lives with their family} Lives in: {Lives in:25570} Stairs: {opstairs:27293} Has following equipment at home: {Assistive devices:23999}  PLOF: {PLOF:24004}  PATIENT GOALS: ***  OBJECTIVE:  Note: Objective measures were completed at Evaluation unless otherwise noted.  DIAGNOSTIC FINDINGS: ***  COGNITION: Overall cognitive status: {cognition:24006}   SENSATION: {sensation:27233}  COORDINATION: ***  EDEMA:  {edema:24020}  MUSCLE TONE: {LE tone:25568}  MUSCLE LENGTH: Hamstrings: Right *** deg; Left *** deg Thomas test: Right *** deg; Left *** deg  DTRs:  {DTR SITE:24025}  POSTURE: {posture:25561}  LOWER EXTREMITY ROM:     {AROM/PROM:27142}  Right Eval Left Eval  Hip flexion    Hip extension    Hip abduction    Hip adduction    Hip internal rotation    Hip external rotation    Knee flexion    Knee extension    Ankle dorsiflexion    Ankle plantarflexion    Ankle inversion    Ankle eversion     (Blank rows = not tested)  LOWER EXTREMITY MMT:    MMT Right Eval Left Eval  Hip flexion    Hip extension    Hip abduction    Hip adduction    Hip internal rotation    Hip external rotation    Knee flexion    Knee extension    Ankle dorsiflexion    Ankle plantarflexion    Ankle inversion    Ankle eversion    (Blank rows = not tested)  BED MOBILITY:  {bed mobility:32615:p}  TRANSFERS: {transfers eval:32620}  RAMP:  {ramp eval:32616}  CURB:  {curb eval:32617}  STAIRS: {stairs eval:32618} GAIT: Findings: {GaitneuroPT:32644::Distance walked: ***,Comments: ***}  FUNCTIONAL TESTS:  {Functional tests:24029}  PATIENT SURVEYS:  {rehab surveys:24030}                                                                                                                               TREATMENT DATE: ***    PATIENT EDUCATION: Education details: *** Person educated: {Person educated:25204} Education method: {Education Method:25205} Education comprehension: {Education Comprehension:25206}  HOME EXERCISE PROGRAM: ***  GOALS: Goals reviewed with patient? {yes/no:20286}  SHORT TERM GOALS: Target date: ***  *** Baseline: Goal status: INITIAL  2.  *** Baseline:  Goal status: INITIAL  3.  *** Baseline:  Goal status: INITIAL  4.  *** Baseline:  Goal status: INITIAL  5.  *** Baseline:  Goal status: INITIAL  6.  *** Baseline:  Goal status: INITIAL  LONG TERM GOALS: Target date: ***  *** Baseline:  Goal status: INITIAL  2.  *** Baseline:  Goal status: INITIAL  3.  *** Baseline:  Goal status: INITIAL  4.  *** Baseline:  Goal status: INITIAL  5.  *** Baseline:  Goal status: INITIAL  6.  *** Baseline:  Goal status: INITIAL  ASSESSMENT:  CLINICAL IMPRESSION: Patient is a *** y.o. *** who was seen today for physical therapy evaluation and treatment for ***.   OBJECTIVE IMPAIRMENTS: {opptimpairments:25111}.   ACTIVITY LIMITATIONS: {activitylimitations:27494}  PARTICIPATION LIMITATIONS: {participationrestrictions:25113}  PERSONAL FACTORS: {Personal factors:25162} are also affecting patient's functional outcome.   REHAB POTENTIAL: {rehabpotential:25112}  CLINICAL DECISION MAKING: {clinical decision making:25114}  EVALUATION COMPLEXITY: {Evaluation complexity:25115}  PLAN:  PT FREQUENCY: {rehab frequency:25116}  PT DURATION: {rehab duration:25117}  PLANNED INTERVENTIONS: {rehab planned interventions:25118::97110-Therapeutic exercises,97530- Therapeutic [phone]- Neuromuscular re-education,97535- Self Rjmz,02859- Manual therapy}  PLAN FOR NEXT SESSION: ***   Waddell Southgate, PT Waddell Southgate, PT, DPT, CSRS  03/12/2024, 12:56  PM

## 2024-03-13 ENCOUNTER — Other Ambulatory Visit: Payer: Self-pay | Admitting: *Deleted

## 2024-03-13 MED ORDER — VITAMIN D (ERGOCALCIFEROL) 1.25 MG (50000 UNIT) PO CAPS: 50000.0000 [IU] | ORAL_CAPSULE | ORAL | 1 refills | Status: AC

## 2024-04-01 ENCOUNTER — Other Ambulatory Visit: Payer: Self-pay | Admitting: *Deleted

## 2024-04-01 DIAGNOSIS — G35 Multiple sclerosis: Secondary | ICD-10-CM

## 2024-04-01 DIAGNOSIS — R269 Unspecified abnormalities of gait and mobility: Secondary | ICD-10-CM

## 2024-04-01 MED ORDER — DALFAMPRIDINE ER 10 MG PO TB12: 10.0000 mg | ORAL_TABLET | Freq: Two times a day (BID) | ORAL | 3 refills | Status: DC

## 2024-04-01 NOTE — Telephone Encounter (Signed)
 Last seen on 01/17/24 Follow up scheduled on 08/19/24

## 2024-04-14 ENCOUNTER — Other Ambulatory Visit: Payer: Self-pay

## 2024-04-14 DIAGNOSIS — I10 Essential (primary) hypertension: Secondary | ICD-10-CM

## 2024-04-14 MED ORDER — HYDROCHLOROTHIAZIDE 25 MG PO TABS: 25.0000 mg | ORAL_TABLET | Freq: Every day | ORAL | 1 refills | Status: AC

## 2024-04-14 NOTE — Telephone Encounter (Signed)
 Hydrochlorothiazide  refill was faxed to the office on 04/14/24 Last filled by patient 03/19/24 last visit was 01/17/24 next office visit is sheduled on 03/19/25    From visit with Amy - She skips hydrochlorothiazide  due to frequent urination as she feels this worsens skin breakdown.  No note in last visit for patient to continue medication please advise.

## 2024-06-04 ENCOUNTER — Telehealth: Payer: Self-pay | Admitting: Neurology

## 2024-06-04 DIAGNOSIS — R269 Unspecified abnormalities of gait and mobility: Secondary | ICD-10-CM

## 2024-06-04 DIAGNOSIS — G35D Multiple sclerosis, unspecified: Secondary | ICD-10-CM

## 2024-06-04 MED ORDER — DALFAMPRIDINE ER 10 MG PO TB12: 10.0000 mg | ORAL_TABLET | Freq: Two times a day (BID) | ORAL | 3 refills | Status: AC

## 2024-06-04 NOTE — Telephone Encounter (Addendum)
 Pt is requesting a refill for dalfampridine  10 MG TB12  . Pt is down to 2 pills  Pharmacy: [facility] ph [phone] option 1

## 2024-06-04 NOTE — Telephone Encounter (Signed)
 Refill sent.

## 2024-06-09 ENCOUNTER — Other Ambulatory Visit (HOSPITAL_COMMUNITY): Payer: Self-pay | Admitting: Neurology

## 2024-06-09 ENCOUNTER — Other Ambulatory Visit: Payer: Self-pay | Admitting: *Deleted

## 2024-07-02 ENCOUNTER — Other Ambulatory Visit: Payer: Self-pay

## 2024-07-02 MED ORDER — BACLOFEN 20 MG PO TABS: 20.0000 mg | ORAL_TABLET | Freq: Three times a day (TID) | ORAL | 4 refills | Status: AC

## 2024-07-03 ENCOUNTER — Telehealth: Payer: Self-pay | Admitting: Neurology

## 2024-07-03 NOTE — Telephone Encounter (Signed)
 Lindsay Schneider received and placed papers in Dr Duncan office.

## 2024-07-03 NOTE — Telephone Encounter (Signed)
 Jeffrey@ [employer] is following up on paperwork he faxed to the attention of Heather, RN at [phone] Reyes has asked this message be sent as high priority re: the status of the paperwork being received by RN so it can be submitted bask to [employer], please call [phone]

## 2024-07-03 NOTE — Telephone Encounter (Signed)
 I called Reyes @ [employer] and LVM informing him that to our knowledge we have not received a fax yet. Asked for him to fax the paperwork again to [phone].

## 2024-07-09 ENCOUNTER — Other Ambulatory Visit (HOSPITAL_COMMUNITY): Payer: Self-pay | Admitting: Neurology

## 2024-07-09 NOTE — Telephone Encounter (Signed)
 Reyes @ [employer] has returned call to [place], [place], [place] agreed to take call.

## 2024-07-09 NOTE — Telephone Encounter (Signed)
 Faxed to [employer], [employer], signed order for wheelchair repairs.  (Signed by Dr. Vear   and Greig Forbes NP.).  fax confirmation received.

## 2024-07-09 NOTE — Telephone Encounter (Signed)
 Lindsay Schneider

## 2024-07-31 ENCOUNTER — Telehealth (HOSPITAL_COMMUNITY): Payer: Self-pay

## 2024-07-31 ENCOUNTER — Encounter (HOSPITAL_COMMUNITY): Payer: Self-pay | Admitting: Neurology

## 2024-07-31 NOTE — Telephone Encounter (Signed)
 Auth Submission: NO AUTH NEEDED Site of care: Site of care: [facility] Payer: [employer] Medicare Medication & CPT/J Code(s) submitted: Ocrevus  (Ocrelizumab ) J2350 Diagnosis Code: G35.A Route of submission (phone, fax, portal): portal Phone # Fax # Auth type: Buy/Bill HB Units/visits requested: 600mg  q25months Reference number: [id number] Approval from: 07/25/24 to 07/23/25

## 2024-08-07 ENCOUNTER — Non-Acute Institutional Stay (HOSPITAL_COMMUNITY)
Admission: RE | Admit: 2024-08-07 | Discharge: 2024-08-07 | Disposition: A | Discharge status: Home / Self Care | Payer: Medicare (Managed Care) | Source: Ambulatory Visit | Attending: Nurse Practitioner | Admitting: Nurse Practitioner

## 2024-08-07 VITALS — BP 115/81 | HR 95 | Temp 97.7°F | Resp 16

## 2024-08-07 DIAGNOSIS — G35A Relapsing-remitting multiple sclerosis: Secondary | ICD-10-CM | POA: Insufficient documentation

## 2024-08-07 MED ORDER — ACETAMINOPHEN 325 MG PO TABS
650.0000 mg | ORAL_TABLET | Freq: Once | ORAL | Status: AC
  Administered 2024-08-07: 650 mg via ORAL
  Filled 2024-08-07: qty 2

## 2024-08-07 MED ORDER — DIPHENHYDRAMINE HCL 25 MG PO CAPS
50.0000 mg | ORAL_CAPSULE | Freq: Once | ORAL | Status: AC
  Administered 2024-08-07: 50 mg via ORAL
  Filled 2024-08-07: qty 2

## 2024-08-07 MED ORDER — SODIUM CHLORIDE 0.9 % IV SOLN
600.0000 mg | Freq: Once | INTRAVENOUS | Status: AC
  Administered 2024-08-07: 600 mg via INTRAVENOUS
  Filled 2024-08-07: qty 20

## 2024-08-07 MED ORDER — SODIUM CHLORIDE 0.9 % IV SOLN: INTRAVENOUS | Status: DC | PRN

## 2024-08-07 MED ORDER — METHYLPREDNISOLONE SODIUM SUCC 125 MG IJ SOLR
125.0000 mg | Freq: Once | INTRAMUSCULAR | Status: AC
  Administered 2024-08-07: 125 mg via INTRAVENOUS
  Filled 2024-08-07: qty 2

## 2024-08-07 NOTE — Progress Notes (Addendum)
"              PATIENT CARE CENTER NOTE     Diagnosis: Multiple sclerosis (HCC) [G35]      Provider: Vear Ade, MD     Procedure: Ocrevus  infusion      Note: Patient received Ocrevus  600 mg infusion via right hand PIV. Patient given pre-meds (PO Tylenol , PO Benadryl , IV Solu-medrol ) per order. Infusion titrated per protocol. Patient tolerated infusion well with no adverse reaction. Patient declined to wait for the 1 hour post infusion observation. Discharge instructions given. Patient to come back in 6 months and appointment scheduled for 02/04/25 . Vital signs stable. Patient alert, oriented and transfers in personal motorized wheel chair at discharge.  Patient discharged home with family member. "

## 2024-08-14 ENCOUNTER — Encounter (HOSPITAL_COMMUNITY): Payer: Self-pay | Admitting: Neurology

## 2024-08-19 ENCOUNTER — Encounter: Payer: Self-pay | Admitting: Family Medicine

## 2024-08-19 ENCOUNTER — Ambulatory Visit: Admitting: Family Medicine

## 2024-08-19 VITALS — BP 115/70 | HR 80

## 2024-08-19 DIAGNOSIS — G35D Multiple sclerosis, unspecified: Secondary | ICD-10-CM | POA: Diagnosis not present

## 2024-08-19 DIAGNOSIS — G35C0 Secondary progressive multiple sclerosis, unspecified: Secondary | ICD-10-CM | POA: Diagnosis not present

## 2024-08-19 DIAGNOSIS — R269 Unspecified abnormalities of gait and mobility: Secondary | ICD-10-CM

## 2024-08-19 DIAGNOSIS — Z79899 Other long term (current) drug therapy: Secondary | ICD-10-CM

## 2024-08-19 DIAGNOSIS — G8114 Spastic hemiplegia affecting left nondominant side: Secondary | ICD-10-CM

## 2024-08-19 DIAGNOSIS — E559 Vitamin D deficiency, unspecified: Secondary | ICD-10-CM | POA: Diagnosis not present

## 2024-08-19 NOTE — Progress Notes (Signed)
 "   PATIENT: Lindsay Schneider DOB: 03/09/1986  REASON FOR VISIT: follow up HISTORY FROM: patient  Chief Complaint  Patient presents with   Follow-up    Pt in room 1. Friend in room. Here for MS follow up.     HISTORY OF PRESENT ILLNESS:  08/19/24 ALL: Lindsay Schneider is a 39 y.o. female here today for follow up for active/relapsing secondary progressive MS. She was switched to Ocrevus  infusions following positive JCV. She is tolerating infusions well. Last infusion 07/2024.  MRI brain, cervical were stable 08/2023, no new lesions.   She reports that MS symptoms are stable.  She continues to have left-sided weakness.  She is unable to use her left side. Right lower ext weakness has worsened over the past year. She is wheelchair-bound. She requires assistance with transfers. Family helps with this. She last worked with PT/OT 02/2024. She feels therapy does help with strengthening. She is seing the [employer]. Now wearing bilateral ankle braces (night splints) for inversion/contractures.   She does have some skin breakdown. Usually resolve with Vaseline. She skips hydrochlorothiazide  due to frequent urination as she feels this worsens skin breakdown. She is wearing depends. BP has been fairly stable. A1C down to 5.6. She is now followed by Dr Karlene, home care PCP.   She continues baclofen  20mg  QID for spasticity as well as dalfampridine  for stiffness. Gabapentin  300mg  TID helps with dysestetic pain.  Mood is good. No worsening depression or anxiety. Sleep is fair. She is no longer taking lorazepam . Unclear why but reports she has not taken She has trouble with cognitive fog. Some short term memory difficulty.    She is taking oxybutynin  daily. Some incontinence. She has family help her transfer to toilet.   Vitamin D  was low. She takes 50000iu weekly.   HISTORY: (copied from Dr Duncan previous note)  Lindsay Schneider is a 39 y.o. woman who was diagnosed with MS in 2011 after  presenting with left sided arm and leg numbness and weakness.    She has an active/relapsing secondary progressive MS.     Update 01/17/2024: She has been on Ocrevus  since 2021 after she converted to JCV Ab positive(high positive at 3.89) and had to discontinue the Tysabri ..   She tolerates Ocrevus  well.  Her next infusion will be  in July 17 at [facility]..          She has bilateral foot pain.   Pain is burning sensation in her soles.   This is her on gabapentin .  She needs refill.  She also has pain in the back f her legs.     She is noting more muscle spasms, despite baclofen  10 mg po qid.        She has an mining engineer wheelchair that is more adjustable..  She is wheelchair bound and needs help to transfer in/out of the wheelchair.   Her left arm and both legs are weak   Her left hand has gradually worsened.   Right leg weakness has progressed over last couple years (left was 0/5)  The right arm is strong but has reduced coordination.  Handwriting is poor.   She has trouble keeping the left hand open.    She feels the spasticity has worsened over the last year.  She is on baclofen  10 mg po qid for the spasticity.   Tizanidine  was poorly tolerated    She has intermittent numbness on the left side and both feet  She has urinary urgency ad rare urge  incontinence.       Vision is poor, left worse than right.  Color vision is reduced on the left.    She notes visual changes more in bright sunlight.     She sleeps well at night and wakes up after 8 hours.       MS History:   In 2011, onset of left arm and leg numbness and weakness.  Her symptoms develop over 1 day. She had an MRI performed with lesions consistent with MS. She started to see Dr. Onita here at [facility]. She was placed on Rebif. However, she stopped because her legs felt weaker when she was on it. She then transferred her care to Dr. Clarice at [facility].     She was started on Tysabri .  She had several infusions of Tysabri  but  transportation was difficult to get to [facility]. Therefore, Dr. Clarice switched her to Rituxan although she tolerated the medication well. She had a significant relapse with bilateral leg weakness within a couple weeks of the infusion. Therefore, 6 months later, she started Tysabri  again. Due to difficulties with transportation, she transferred care to us  in October 2016.  She converted to JCV Ab positive early 2021 She switched to Ocrevus .     Imaging: MRI of the brain, cervical spine 01/13/2022 showed an unchanged distribution of white matter lesions consistent with MS.  There were no enhancing lesions.  MRI of the thoracic spine showed no demyelinating plaques.  No significant degenerative changes.  No spinal stenosis or nerve root compression.   MRI of the lumbar spine 01/13/2022 was unremarkable.   MRI of the brain 04/20/2021 showed T2/FLAIR hyperintense foci in the hemispheres, brainstem and cerebellum in a pattern and configuration consistent with chronic demyelinating plaque associated with multiple sclerosis.  None of the foci appear to be acute.  Compared to the MRI dated 06/23/2015, there do not appear to be any definite new lesions.  Generalized cortical atrophy that has progressed compared to the 2016 MRI   MRI of the cervical spine 04/20/2021 showed  Multiple discrete and confluent foci within the spinal cord and additional foci noted within the brainstem and cerebellum.  These are consistent with chronic demyelinating plaque associated with multiple sclerosis.  There is no definite change compared to the MRI dated 06/23/2015.   At C4-C5, there are disc osteophyte complexes causing borderline spinal stenosis and mild foraminal narrowing but no nerve root compression.  This has progressed compared to the 2016 MRI.   MRI of the brain 06/25/2015 shows a couple small T2/FLAIR hyperintense foci in the cerebellar hemispheres and other foci in the left middle cerebellar peduncle and pons..   The  deep gray matter appears normal.  In the hemispheres, there are are multiple single and confluent T2/FLAIR hyperintense foci, predominantly in the periventricular white matter. Many of these foci are radially oriented to the ventricles.    MRI of the cervical spine 06/23/2015 shows multiple T2 hyperintense foci within the spinal cord. They are located posteriorly to the left adjacent to C2, posteriorly adjacent to C3, posterior to the right adjacent to C4, posterior to the right adjacent to C5, posterior to the right and central adjacent to C6.   None of these foci enhanced after contrast administration.      REVIEW OF SYSTEMS: Out of a complete 14 system review of symptoms, the patient complains only of the following symptoms, weakness, numbness, spacticity, frequent urination, skin breakdown and  all other reviewed systems are negative.  ALLERGIES: No Known Allergies  HOME MEDICATIONS: Outpatient Medications Prior to Visit  Medication Sig Dispense Refill   albuterol  (PROVENTIL ) (2.5 MG/3ML) 0.083% nebulizer solution INHALE 3 ML BY NEBULIZATION EVERY 6 HOURS AS NEEDED FOR WHEEZING OR SHORTNESS OF BREATH 75 mL 12   albuterol  (VENTOLIN  HFA) 108 (90 Base) MCG/ACT inhaler Inhale 2 puffs into the lungs every 6 (six) hours as needed for wheezing or shortness of breath. 8 g 2   baclofen  (LIORESAL ) 20 MG tablet Take 1 tablet (20 mg total) by mouth 3 (three) times daily. 270 tablet 4   dalfampridine  10 MG TB12 Take 1 tablet (10 mg total) by mouth 2 (two) times daily. 180 tablet 3   fexofenadine  (ALLEGRA  ALLERGY) 60 MG tablet Take 1 tablet (60 mg total) by mouth 2 (two) times daily. 60 tablet 0   fluticasone -salmeterol (ADVAIR  DISKUS) 250-50 MCG/ACT AEPB Inhale 1 puffs into the lungs in the morning and at bedtime. 120 each 0   gabapentin  (NEURONTIN ) 300 MG capsule Take 1 capsule (300 mg total) by mouth 3 (three) times daily. 270 capsule 4   hydrochlorothiazide  (HYDRODIURIL ) 25 MG tablet Take 1 tablet (25  mg total) by mouth daily. 90 tablet 1   oxybutynin  (DITROPAN  XL) 10 MG 24 hr tablet Take 1 tablet (10 mg total) by mouth at bedtime. 30 tablet 11   Vitamin D , Ergocalciferol , (DRISDOL ) 1.25 MG (50000 UNIT) CAPS capsule Take 1 capsule (50,000 Units total) by mouth every 7 (seven) days. 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    PAST SURGICAL HISTORY: Past Surgical History:  Procedure Laterality Date   CESAREAN SECTION      FAMILY HISTORY: Family History  Problem Relation Age of Onset   Asthma Mother    Hypertension Father    Healthy Sister    Healthy Brother     SOCIAL HISTORY: Social History   Socioeconomic History   Marital status: Single    Spouse name: Not on file   Number of children: Not on file   Years of education: Not on file   Highest education level: Not on file  Occupational History   Not on file  Tobacco Use   Smoking status: Never   Smokeless tobacco: Never  Vaping Use   Vaping status: Never Used  Substance and Sexual Activity   Alcohol use: No   Drug use: No   Sexual activity: Not Currently    Birth control/protection: Injection  Other Topics Concern   Not on file  Social History Narrative   Right Handed   1 Soda every now and then   Social Drivers of Health   Tobacco Use: Low Risk (08/19/2024)   Patient History    Smoking Tobacco Use: Never    Smokeless Tobacco Use: Never    Passive Exposure: Not on file  Financial Resource Strain: Not on file  Food Insecurity: Not on file   Transportation Needs: Not on file  Physical  Activity: Not on file  Stress: Not on file  Social Connections: Not on file  Intimate Partner Violence: Not on file  Depression (EYV7-0): Not on file  Alcohol Screen: Not on file  Housing: Not on file  Utilities: Not on file  Health Literacy: Not on file      PHYSICAL EXAM  Vitals:   08/19/24 1402  BP: 115/70  Pulse: 80  SpO2: 98%      There is no height or weight on file to calculate BMI.  Generalized: Well developed, in no acute distress  Cardiology: normal rate and rhythm, no murmur noted Neurological examination  Mentation: Alert oriented to time, place, history taking. Follows all commands speech and language fluent Cranial nerve II-XII: Pupils were equal round reactive to light. Extraocular movements were full, visual field were full on confrontational test. Facial sensation and strength were normal. Uvula tongue midline. Head turning and shoulder shrug  were normal and symmetric. Motor: The motor testing reveals 4+ over 5 strength of right upper extremity, 2+/5 of left upper flexion, 2/5 left upper extension, 1/5 left and 2/5 right lower extremities. Bilateral ankle inversion.  Sensory: Sensory testing is intact to soft touch on all 4 extremities. No evidence of extinction is noted.  Coordination: Cerebellar testing reveals good finger-nose-finger with right hand, unable to perform with left hand and bilateral lower extremities.   Gait and station: Unable to ambulate, patient is in wheelchair today. Reflexes: Deep tendon reflexes are diminished in bilateral lower patellar    DIAGNOSTIC DATA (LABS, IMAGING, TESTING) - I reviewed patient records, labs, notes, testing and imaging myself where available.      No data to display           Lab Results  Component Value Date   WBC 8.4 07/04/2023   HGB 12.2 07/04/2023   HCT 38.7 07/04/2023   MCV 90 07/04/2023   PLT 315 07/04/2023      Component Value Date/Time   NA  139 07/04/2023 0850   K 3.4 (L) 07/04/2023 0850   CL 100 07/04/2023 0850   CO2 24 07/04/2023 0850   GLUCOSE 257 (H) 07/04/2023 0850   GLUCOSE 112 (H) 01/13/2022 0148   BUN 8 07/04/2023 0850   CREATININE 0.74 07/04/2023 0850   CALCIUM 9.2 07/04/2023 0850   PROT 6.5 07/04/2023 0850   ALBUMIN 3.7 (L) 07/04/2023 0850   AST 15 07/04/2023 0850   ALT 21 07/04/2023 0850   ALKPHOS 142 (H) 07/04/2023 0850   BILITOT 0.3 07/04/2023 0850   GFRNONAA >60 01/13/2022 0148   GFRAA 144 05/21/2019 1057   No results found for: CHOL, HDL, LDLCALC, LDLDIRECT, TRIG, CHOLHDL Lab Results  Component Value Date   HGBA1C 12.5 (H) 07/04/2023   No results found for: VITAMINB12 Lab Results  Component Value Date   TSH 3.160 02/02/2022       ASSESSMENT AND PLAN 39 y.o. 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 here with     ICD-10-CM   1. Secondary progressive multiple sclerosis  G35.C0 CBC with Differential/Platelets    IgG, IgA, IgM    Ambulatory referral to Physical Therapy    Ambulatory referral to Occupational Therapy    2. Multiple sclerosis  G35.D     3. High risk medication use  Z79.899     4. Gait disturbance  R26.9  Ambulatory referral to Physical Therapy    Ambulatory referral to Occupational Therapy    5. 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 Vitamin D  deficiency  E55.9 Vitamin D , 25-hydroxy       Oluwatobi feels that MS symptoms are fairly stable. She will continue Ocrevus  infusions every 6 months.MRI stable 08/2023. We will update labs today. She will continue baclofen  20mg  TID, dalfampridine  10mg  BID, gabapentin  300mg  TID  and oxybutynin  10mg  daily. Contnue hydrochlorothiazide  25mg  daily. Monitor BP closely. Now followed by in home PCP. She is wheelchair bound and has family assisting with transfers. I would like to refer her back to PT/OT. She was encouraged to continue close follow up with [facility] for bilateral ankle braces to help correct bilateral ankle contractures. She  follow-up in 6 months with Dr. Vear.  She verbalizes understanding and agreement with this plan.   Orders Placed This Encounter  Procedures   CBC with Differential/Platelets   IgG, IgA, IgM   Vitamin D , 25-hydroxy   Ambulatory referral to Physical Therapy    Referral Priority:   Routine    Referral Type:   Physical Medicine    Referral Reason:   Specialty Services Required    Requested Specialty:   Physical Therapy    Number of Visits Requested:   1   Ambulatory referral to Occupational Therapy    Referral Priority:   Routine    Referral Type:   Occupational Therapy    Referral Reason:   Specialty Services Required    Requested Specialty:   Occupational Therapy    Number of Visits Requested:   1     No orders of the defined types were placed in this encounter.    Greig Forbes, FNP-C 08/19/2024, 2:50 PM [facility] [address], [address] [place], [place] [address] [phone]  "

## 2024-08-19 NOTE — Patient Instructions (Signed)
 Below is our plan:  We will continue current treatment plan. I will send referral to PT/OT. Call [facility] to reschedule appt.   Please make sure you are staying well hydrated. I recommend 50-60 ounces daily. Well balanced diet and regular exercise encouraged. Consistent sleep schedule with 6-8 hours recommended.   Please continue follow up with care team as directed.   Follow up with Dr Vear in 6 months   You may receive a survey regarding today's visit. I encourage you to leave honest feed back as I do use this information to improve patient care. Thank you for seeing me today!

## 2024-08-20 ENCOUNTER — Ambulatory Visit: Payer: Self-pay | Admitting: Neurology

## 2024-08-20 LAB — CBC WITH DIFFERENTIAL/PLATELET
Basophils Absolute: 0 10*3/uL (ref 0.0–0.2)
Basos: 0 %
EOS (ABSOLUTE): 0.2 10*3/uL (ref 0.0–0.4)
Eos: 2 %
Hematocrit: 42.6 % (ref 34.0–46.6)
Hemoglobin: 13.2 g/dL (ref 11.1–15.9)
Immature Grans (Abs): 0 10*3/uL (ref 0.0–0.1)
Immature Granulocytes: 0 %
Lymphocytes Absolute: 2.6 10*3/uL (ref 0.7–3.1)
Lymphs: 21 %
MCH: 27 pg (ref 26.6–33.0)
MCHC: 31 g/dL — ABNORMAL LOW (ref 31.5–35.7)
MCV: 87 fL (ref 79–97)
Monocytes Absolute: 0.9 10*3/uL (ref 0.1–0.9)
Monocytes: 7 %
Neutrophils Absolute: 8.7 10*3/uL — ABNORMAL HIGH (ref 1.4–7.0)
Neutrophils: 70 %
Platelets: 354 10*3/uL (ref 150–450)
RBC: 4.89 x10E6/uL (ref 3.77–5.28)
RDW: 15.3 % (ref 11.7–15.4)
WBC: 12.5 10*3/uL — ABNORMAL HIGH (ref 3.4–10.8)

## 2024-08-20 LAB — IGG, IGA, IGM
IgG (Immunoglobin G), Serum: 1152 mg/dL (ref 586–1602)
IgM (Immunoglobulin M), Srm: 26 mg/dL (ref 26–217)
Immunoglobulin A, (IgA) QN, Serum: 295 mg/dL (ref 87–352)

## 2024-08-20 LAB — VITAMIN D 25 HYDROXY (VIT D DEFICIENCY, FRACTURES): Vit D, 25-Hydroxy: 70.6 ng/mL (ref 30.0–100.0)

## 2024-08-28 ENCOUNTER — Other Ambulatory Visit: Payer: Self-pay | Admitting: Neurology

## 2024-08-29 ENCOUNTER — Other Ambulatory Visit: Payer: Self-pay | Admitting: Neurology

## 2024-08-29 MED ORDER — SULFAMETHOXAZOLE-TRIMETHOPRIM 800-160 MG PO TABS: 1.0000 | ORAL_TABLET | Freq: Two times a day (BID) | ORAL | 0 refills | Status: AC

## 2024-09-08 ENCOUNTER — Ambulatory Visit: Payer: Medicare (Managed Care) | Admitting: Physical Therapy

## 2024-09-08 ENCOUNTER — Ambulatory Visit: Payer: Medicare (Managed Care) | Admitting: Occupational Therapy

## 2025-02-04 ENCOUNTER — Ambulatory Visit (HOSPITAL_COMMUNITY): Payer: Medicare (Managed Care)

## 2025-04-21 ENCOUNTER — Ambulatory Visit: Admitting: Neurology
